# Patient Record
Sex: Female | Born: 1975 | Race: White | Hispanic: No | Marital: Single | Smoking: Never smoker
Health system: Southern US, Community
[De-identification: ages and names within clinical notes are randomized; demographics above are authoritative.]

## PROBLEM LIST (undated history)

## (undated) DIAGNOSIS — R569 Unspecified convulsions: Secondary | ICD-10-CM

## (undated) DIAGNOSIS — F842 Rett's syndrome: Secondary | ICD-10-CM

## (undated) DIAGNOSIS — G249 Dystonia, unspecified: Secondary | ICD-10-CM

---

## 2003-12-16 ENCOUNTER — Other Ambulatory Visit: Payer: Self-pay

## 2012-12-28 ENCOUNTER — Ambulatory Visit: Payer: Medicare Other | Attending: Family Medicine | Admitting: Physical Therapy

## 2012-12-28 DIAGNOSIS — R269 Unspecified abnormalities of gait and mobility: Secondary | ICD-10-CM | POA: Insufficient documentation

## 2012-12-28 DIAGNOSIS — IMO0001 Reserved for inherently not codable concepts without codable children: Secondary | ICD-10-CM | POA: Insufficient documentation

## 2013-04-25 ENCOUNTER — Encounter (HOSPITAL_COMMUNITY): Payer: Self-pay | Admitting: Emergency Medicine

## 2013-04-25 ENCOUNTER — Emergency Department (HOSPITAL_COMMUNITY): Payer: Medicare Other

## 2013-04-25 ENCOUNTER — Emergency Department (HOSPITAL_COMMUNITY)
Admission: EM | Admit: 2013-04-25 | Discharge: 2013-04-25 | Disposition: A | Discharge status: Home / Self Care | Payer: Medicare Other | Attending: Emergency Medicine | Admitting: Emergency Medicine

## 2013-04-25 DIAGNOSIS — R4182 Altered mental status, unspecified: Secondary | ICD-10-CM | POA: Insufficient documentation

## 2013-04-25 DIAGNOSIS — R259 Unspecified abnormal involuntary movements: Secondary | ICD-10-CM | POA: Insufficient documentation

## 2013-04-25 DIAGNOSIS — R064 Hyperventilation: Secondary | ICD-10-CM | POA: Insufficient documentation

## 2013-04-25 DIAGNOSIS — G3189 Other specified degenerative diseases of nervous system: Secondary | ICD-10-CM | POA: Insufficient documentation

## 2013-04-25 DIAGNOSIS — Z79899 Other long term (current) drug therapy: Secondary | ICD-10-CM | POA: Insufficient documentation

## 2013-04-25 DIAGNOSIS — R000 Tachycardia, unspecified: Secondary | ICD-10-CM | POA: Insufficient documentation

## 2013-04-25 DIAGNOSIS — R569 Unspecified convulsions: Secondary | ICD-10-CM | POA: Diagnosis present

## 2013-04-25 HISTORY — DX: Dystonia, unspecified: G24.9

## 2013-04-25 HISTORY — DX: Unspecified convulsions: R56.9

## 2013-04-25 HISTORY — DX: Genetic susceptibility to epilepsy and neurodevelopmental disorders: F84.2

## 2013-04-25 LAB — URINALYSIS W MICROSCOPIC + REFLEX CULTURE
Bilirubin Urine: NEGATIVE
Glucose, UA: NEGATIVE mg/dL
Hgb urine dipstick: NEGATIVE
KETONES UR: 15 mg/dL — AB
LEUKOCYTES UA: NEGATIVE
NITRITE: NEGATIVE
PH: 8.5 — AB (ref 5.0–8.0)
PROTEIN: 100 mg/dL — AB
Specific Gravity, Urine: 1.027 (ref 1.005–1.030)
Urobilinogen, UA: 1 mg/dL (ref 0.0–1.0)

## 2013-04-25 LAB — CBC WITH DIFFERENTIAL/PLATELET
Basophils Absolute: 0 10*3/uL (ref 0.0–0.1)
Basophils Relative: 1 % (ref 0–1)
EOS ABS: 0.1 10*3/uL (ref 0.0–0.7)
EOS PCT: 2 % (ref 0–5)
HEMATOCRIT: 38.8 % (ref 36.0–46.0)
HEMOGLOBIN: 12.9 g/dL (ref 12.0–15.0)
LYMPHS ABS: 1.6 10*3/uL (ref 0.7–4.0)
Lymphocytes Relative: 29 % (ref 12–46)
MCH: 28.2 pg (ref 26.0–34.0)
MCHC: 33.2 g/dL (ref 30.0–36.0)
MCV: 84.9 fL (ref 78.0–100.0)
MONO ABS: 0.4 10*3/uL (ref 0.1–1.0)
MONOS PCT: 7 % (ref 3–12)
NEUTROS PCT: 62 % (ref 43–77)
Neutro Abs: 3.4 10*3/uL (ref 1.7–7.7)
Platelets: 123 10*3/uL — ABNORMAL LOW (ref 150–400)
RBC: 4.57 MIL/uL (ref 3.87–5.11)
RDW: 16.5 % — ABNORMAL HIGH (ref 11.5–15.5)
WBC: 5.5 10*3/uL (ref 4.0–10.5)

## 2013-04-25 LAB — COMPREHENSIVE METABOLIC PANEL
ALT: 14 U/L (ref 0–35)
AST: 26 U/L (ref 0–37)
Albumin: 3.8 g/dL (ref 3.5–5.2)
Alkaline Phosphatase: 56 U/L (ref 39–117)
BUN: 18 mg/dL (ref 6–23)
CALCIUM: 9 mg/dL (ref 8.4–10.5)
CO2: 19 mEq/L (ref 19–32)
Chloride: 105 mEq/L (ref 96–112)
Creatinine, Ser: 0.47 mg/dL — ABNORMAL LOW (ref 0.50–1.10)
GFR calc non Af Amer: >90 mL/min (ref >90)
GLUCOSE: 84 mg/dL (ref 70–99)
Potassium: 4 mEq/L (ref 3.7–5.3)
Sodium: 139 mEq/L (ref 137–147)
Total Bilirubin: 0.3 mg/dL (ref 0.3–1.2)
Total Protein: 7.4 g/dL (ref 6.0–8.3)

## 2013-04-25 MED ORDER — SODIUM CHLORIDE 0.9 % IV BOLUS (SEPSIS)
500.0000 mL | INTRAVENOUS | Status: AC
  Administered 2013-04-25: 500 mL via INTRAVENOUS

## 2013-04-25 MED ORDER — LORAZEPAM 2 MG/ML IJ SOLN
0.5000 mg | Freq: Once | INTRAMUSCULAR | Status: AC
  Administered 2013-04-25: 0.5 mg via INTRAVENOUS
  Filled 2013-04-25: qty 1

## 2013-04-25 NOTE — Discharge Instructions (Signed)
Seizure, Adult A seizure is abnormal electrical activity in the brain. Seizures usually last from 30 seconds to 2 minutes. There are various types of seizures. Before a seizure, you may have a warning sensation (aura) that a seizure is about to occur. An aura may include the following symptoms:   Fear or anxiety.  Nausea.  Feeling like the room is spinning (vertigo).  Vision changes, such as seeing flashing lights or spots. Common symptoms during a seizure include:  A change in attention or behavior (altered mental status).  Convulsions with rhythmic jerking movements.  Drooling.  Rapid eye movements.  Grunting.  Loss of bladder and bowel control.  Bitter taste in the mouth.  Tongue biting. After a seizure, you may feel confused and sleepy. You may also have an injury resulting from convulsions during the seizure. HOME CARE INSTRUCTIONS   If you are given medicines, take them exactly as prescribed by your health care provider.  Keep all follow-up appointments as directed by your health care provider.  Do not swim or drive or engage in risky activity during which a seizure could cause further injury to you or others until your health care provider says it is OK.  Get adequate rest.  Teach friends and family what to do if you have a seizure. They should:  Lay you on the ground to prevent a fall.  Put a cushion under your head.  Loosen any tight clothing around your neck.  Turn you on your side. If vomiting occurs, this helps keep your airway clear.  Stay with you until you recover.  Know whether or not you need emergency care. SEEK IMMEDIATE MEDICAL CARE IF:  The seizure lasts longer than 5 minutes.  The seizure is severe or you do not wake up immediately after the seizure.  You have an altered mental status after the seizure.  You are having more frequent or worsening seizures. Someone should drive you to the emergency department or call local emergency  services (911 in [place] MAKE SURE YOU:  Understand these instructions.  Will watch your condition.  Will get help right away if you are not doing well or get worse. Document Released: 03/21/2000 Document Revised: 01/12/2013 Document Reviewed: 11/03/2012 Exit[employer] Patient Information 2014 [employer].  

## 2013-04-25 NOTE — ED Notes (Addendum)
Pt does not communicate, seizure pads placed.

## 2013-04-25 NOTE — ED Notes (Signed)
Pt presents [facility] with questionable seizure witnessed by family member.  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

## 2013-04-25 NOTE — ED Provider Notes (Signed)
CSN: [id number]     Arrival date & time 04/25/13  1218 History   First MD Initiated Contact with Patient 04/25/13 1222     Chief Complaint  Patient presents with  . Seizures   (Consider location/radiation/quality/duration/timing/severity/associated sxs/prior Treatment) Patient is a 38 y.o. female presenting with seizures. The history is provided by the patient.  Seizures Seizure activity on arrival: no   Seizure type:  Unable to specify Initial focality:  None Episode characteristics: abnormal movements   Return to baseline: yes   Severity:  Mild Duration: 10-15 minutes. Timing:  Once Progression:  Resolved Context comment:  Rett syndrome Recent head injury:  No recent head injuries PTA treatment:  None History of seizures: yes     History reviewed. No pertinent past medical history. History reviewed. No pertinent past surgical history. No family history on file. History  Substance Use Topics  . Smoking status: Not on file  . Smokeless tobacco: Not on file  . Alcohol Use: Not on file   OB History   Grav Para Term Preterm Abortions TAB SAB Ect Mult Living                 Review of Systems  Constitutional: Negative for fever and fatigue.  HENT: Negative for congestion and drooling.   Eyes: Negative for pain.  Respiratory: Negative for cough and shortness of breath.   Cardiovascular: Negative for chest pain.  Gastrointestinal: Negative for nausea, vomiting, abdominal pain and diarrhea.  Genitourinary: Negative for dysuria and hematuria.  Musculoskeletal: Negative for back pain, gait problem and neck pain.  Skin: Negative for color change.  Neurological: Positive for seizures. Negative for dizziness and headaches.  Hematological: Negative for adenopathy.  Psychiatric/Behavioral: Negative for behavioral problems.  All other systems reviewed and are negative.    Allergies  Review of patient's allergies indicates not on file.  Home Medications  No current  outpatient prescriptions on file. BP 152/78  Pulse 110  Temp(Src) 99.9 F (37.7 C) (Axillary)  Resp 30  Wt 86 lb (39.009 kg)  SpO2 100% Physical Exam  Nursing note and vitals reviewed. Constitutional: She appears well-developed and well-nourished.  Intermittent hyperventilation and breath holding spells.   HENT:  Head: Normocephalic.  Mouth/Throat: Oropharynx is clear and moist. No oropharyngeal exudate.  Eyes: Conjunctivae and EOM are normal. Pupils are equal, round, and reactive to light.  Neck: Normal range of motion. Neck supple.  Cardiovascular: Normal rate, regular rhythm, normal heart sounds and intact distal pulses.  Exam reveals no gallop and no friction rub.   No murmur heard. HR 96  Pulmonary/Chest: Effort normal and breath sounds normal. No respiratory distress. She has no wheezes.  Abdominal: Soft. Bowel sounds are normal. There is no tenderness. There is no rebound and no guarding.  Musculoskeletal: Normal range of motion. She exhibits no edema and no tenderness.  Neurological: She is alert.  Pt w/ some lip smacking and looking around the room. Normal per parents.   Skin: Skin is warm and dry.  Psychiatric: She has a normal mood and affect. Her behavior is normal.    ED Course  Procedures (including critical care time) Labs Review Labs Reviewed  CBC WITH DIFFERENTIAL - Abnormal; Notable for the following:    RDW 16.5 (*)    Platelets 123 (*)    All other components within normal limits  COMPREHENSIVE METABOLIC PANEL - Abnormal; Notable for the following:    Creatinine, Ser 0.47 (*)    All other components within normal  limits  URINALYSIS W MICROSCOPIC + REFLEX CULTURE - Abnormal; Notable for the following:    APPearance TURBID (*)    pH 8.5 (*)    Ketones, ur 15 (*)    Protein, ur 100 (*)    Squamous Epithelial / LPF FEW (*)    All other components within normal limits   Imaging Review Dg Chest 1 View  04/25/2013   CLINICAL DATA:  Seizure.  EXAM: CHEST  - 1 VIEW  COMPARISON:  None.  FINDINGS: Mediastinum and hilar structures are normal. Deformity noted over the lower right posterior ribs including the sixth, seventh, and eighth ribs. This may be congenital. Active bony lesions cannot be excluded. Severe thoracic spine scoliosis is present. Surgical rod fixation noted. Patient rotated to the left. Mild increased density is present in the right lung base. This may be related to overlying rib deformity and patient rotation, mild pneumonia cannot be excluded. Heart size normal.  IMPRESSION: 1. Lower posterior right rib deformities. Scoliosis with prior surgical fixation. Patient rotated to the left. These findings together result in increased density projected over the right lung base. Underlying pneumonia cannot be completely excluded. 2. Right posterior rib lesions present on most likely congenital however not to process cannot be excluded.   Electronically Signed   By: Maisie Fushomas  Register   On: 04/25/2013 13:43   Ct Head Wo Contrast  04/25/2013   CLINICAL DATA:  Seizure.  Rett syndrome  EXAM: CT HEAD WITHOUT CONTRAST  TECHNIQUE: Contiguous axial images were obtained from the base of the skull through the vertex without intravenous contrast.  COMPARISON:  None.  FINDINGS: No evidence of intracranial hemorrhage, brain edema, or other signs of acute infarction. No evidence of intracranial mass lesion or mass effect. No abnormal extraaxial fluid collections identified. Ventricles are normal in size. No skull abnormality identified.  IMPRESSION: No acute intracranial findings.   Electronically Signed   By: Myles RosenthalJohn  Stahl M.D.   On: 04/25/2013 13:52    EKG Interpretation    Date/Time:  Monday April 25 2013 12:23:58 EST Ventricular Rate:  105 PR Interval:  154 QRS Duration: 77 QT Interval:  330 QTC Calculation: 436 R Axis:   65 Text Interpretation:  Sinus tachycardia Confirmed by Abdirahim Flavell  MD, Heinrich Fertig ([id number]) on 04/26/2013 11:03:27 AM            MDM    1. 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 The parents note that prior to arrival the patient had writhing-like movements and altered level of consciousness for approximately 10-15 minutes. They state that she has otherwise been well and denies any fever, vomiting, diarrhea, or cough. She is afebrile and mildly tachycardic here. She is currently hyperventilating which the parents state is not a new finding. They state she has a history of breath-holding spells as well as hyperventilation as a form of self-stimulation and this is normal. The parents deny any head injuries, sleep loss, or missing medications. Will get screening labwork and imaging.  3:33 PM: I interpreted/reviewed the labs and/or imaging which were non-contributory.  Pt continues to appear well. I attempted to contact Dr. Tera HelperBoggs, her neurologist, but it is a holiday ([employer] day) and she is unable to be reached. Specifically I wanted to discuss possible inc of her carbemazepine. As the pt appears well w/ questionable seizure episode and non-contrib workup. I think  it is reasonable to have the parents touch base w/ Dr. Tera Helper tomorrow and discuss the increase. They are happy w/ that plan.  I have discussed the diagnosis/risks/treatment options with the family and believe the pt to be eligible for discharge home to follow-up with Dr. Tera Helper tomorrow. We also discussed returning to the ED immediately if new or worsening sx occur. We discussed the sx which are most concerning (e.g., further episodes, fever, AMS) that necessitate immediate return. Any new prescriptions provided to the patient are listed below.  New Prescriptions   No medications on file     Junius Argyle, MD 04/26/13 1112

## 2015-05-24 ENCOUNTER — Encounter (HOSPITAL_COMMUNITY): Payer: Self-pay | Admitting: *Deleted

## 2015-05-24 ENCOUNTER — Emergency Department (HOSPITAL_COMMUNITY)
Admission: EM | Admit: 2015-05-24 | Discharge: 2015-05-24 | Disposition: A | Discharge status: Home / Self Care | Payer: Medicare Other | Attending: Emergency Medicine | Admitting: Emergency Medicine

## 2015-05-24 ENCOUNTER — Emergency Department (HOSPITAL_COMMUNITY): Payer: Medicare Other

## 2015-05-24 DIAGNOSIS — R05 Cough: Secondary | ICD-10-CM | POA: Diagnosis present

## 2015-05-24 DIAGNOSIS — Z880 Allergy status to penicillin: Secondary | ICD-10-CM | POA: Diagnosis not present

## 2015-05-24 DIAGNOSIS — Z79899 Other long term (current) drug therapy: Secondary | ICD-10-CM | POA: Insufficient documentation

## 2015-05-24 DIAGNOSIS — Z8659 Personal history of other mental and behavioral disorders: Secondary | ICD-10-CM | POA: Diagnosis not present

## 2015-05-24 DIAGNOSIS — J069 Acute upper respiratory infection, unspecified: Secondary | ICD-10-CM | POA: Diagnosis not present

## 2015-05-24 DIAGNOSIS — R059 Cough, unspecified: Secondary | ICD-10-CM

## 2015-05-24 DIAGNOSIS — G249 Dystonia, unspecified: Secondary | ICD-10-CM | POA: Insufficient documentation

## 2015-05-24 DIAGNOSIS — B9789 Other viral agents as the cause of diseases classified elsewhere: Secondary | ICD-10-CM

## 2015-05-24 LAB — CBC WITH DIFFERENTIAL/PLATELET
Basophils Absolute: 0 10*3/uL (ref 0.0–0.1)
Basophils Relative: 0 %
EOS PCT: 0 %
Eosinophils Absolute: 0 10*3/uL (ref 0.0–0.7)
HCT: 34.1 % — ABNORMAL LOW (ref 36.0–46.0)
Hemoglobin: 9.9 g/dL — ABNORMAL LOW (ref 12.0–15.0)
LYMPHS ABS: 0.8 10*3/uL (ref 0.7–4.0)
Lymphocytes Relative: 13 %
MCH: 22 pg — AB (ref 26.0–34.0)
MCHC: 29 g/dL — ABNORMAL LOW (ref 30.0–36.0)
MCV: 75.6 fL — ABNORMAL LOW (ref 78.0–100.0)
MONO ABS: 0.5 10*3/uL (ref 0.1–1.0)
MONOS PCT: 8 %
NEUTROS ABS: 4.8 10*3/uL (ref 1.7–7.7)
Neutrophils Relative %: 79 %
PLATELETS: 110 10*3/uL — AB (ref 150–400)
RBC: 4.51 MIL/uL (ref 3.87–5.11)
RDW: 19.3 % — ABNORMAL HIGH (ref 11.5–15.5)
WBC: 6.1 10*3/uL (ref 4.0–10.5)

## 2015-05-24 LAB — BASIC METABOLIC PANEL
Anion gap: 11 (ref 5–15)
BUN: 13 mg/dL (ref 6–20)
CO2: 24 mmol/L (ref 22–32)
CREATININE: 0.45 mg/dL (ref 0.44–1.00)
Calcium: 9.2 mg/dL (ref 8.9–10.3)
Chloride: 105 mmol/L (ref 101–111)
GFR calc Af Amer: >60 mL/min (ref >60)
GFR calc non Af Amer: >60 mL/min (ref >60)
Glucose, Bld: 108 mg/dL — ABNORMAL HIGH (ref 65–99)
Potassium: 4 mmol/L (ref 3.5–5.1)
SODIUM: 140 mmol/L (ref 135–145)

## 2015-05-24 LAB — I-STAT CG4 LACTIC ACID, ED: LACTIC ACID, VENOUS: 0.72 mmol/L (ref 0.5–2.0)

## 2015-05-24 MED ORDER — IPRATROPIUM-ALBUTEROL 0.5-2.5 (3) MG/3ML IN SOLN: 3.0000 mL | RESPIRATORY_TRACT | Status: DC

## 2015-05-24 MED ORDER — SODIUM CHLORIDE 0.9 % IV BOLUS (SEPSIS)
2000.0000 mL | Freq: Once | INTRAVENOUS | Status: AC
  Administered 2015-05-24: 2000 mL via INTRAVENOUS

## 2015-05-24 MED ORDER — IPRATROPIUM-ALBUTEROL 0.5-2.5 (3) MG/3ML IN SOLN
3.0000 mL | Freq: Once | RESPIRATORY_TRACT | Status: AC
  Administered 2015-05-24: 3 mL via RESPIRATORY_TRACT
  Filled 2015-05-24: qty 3

## 2015-05-24 NOTE — ED Provider Notes (Signed)
CSN: [id number]     Arrival date & time 05/24/15  0128 History   First MD Initiated Contact with Patient 05/24/15 0449     Chief Complaint  Patient presents with  . Cough     (Consider location/radiation/quality/duration/timing/severity/associated sxs/prior Treatment) HPI  Alicia Duncan is a 40 y.o. female with PMH of Rett syndrome and dystonia, here with coughing for the past couple of days.  History obtained from husband as the patient is nonverbal. He states the patient has not had any fevers.  The cough sounds "junky" and he is concerned for pneumonia.  She has otherwise been at her normal states of health.  She has sick contacts in her father who recently had a cold.  There are no further complaints.  10 Systems reviewed and are negative for acute change except as noted in the HPI.    Past Medical History  Diagnosis Date  . Rett syndrome   . Seizures (HCC)   . Dystonia    History reviewed. No pertinent past surgical history. No family history on file. Social History  Substance Use Topics  . Smoking status: Never Smoker   . Smokeless tobacco: Never Used  . Alcohol Use: No   OB History    No data available     Review of Systems    Allergies  Benadryl and Penicillins  Home Medications   Prior to Admission medications   Medication Sig Start Date End Date Taking? Authorizing Provider  baclofen (LIORESAL) 10 MG tablet Take 10 mg by mouth 3 (three) times daily.    Historical Provider, MD  carbamazepine (CARBATROL) 300 MG 12 hr capsule Take 300 mg by mouth 2 (two) times daily.    Historical Provider, MD  FLUoxetine (PROZAC) 10 MG tablet Take 10 mg by mouth daily.    Historical Provider, MD  omeprazole (PRILOSEC) 20 MG capsule Take 20 mg by mouth daily.    Historical Provider, MD   BP 109/79 mmHg  Pulse 92  Temp(Src) 98.6 F (37 C) (Rectal)  Resp 27  SpO2 96% Physical Exam  Constitutional: No distress.  HENT:  Head: Normocephalic and atraumatic.  Nose: Nose  normal.  Eyes: Conjunctivae and EOM are normal. Pupils are equal, round, and reactive to light. No scleral icterus.  Neck: No JVD present. No tracheal deviation present. No thyromegaly present.  Cardiovascular: Normal rate, regular rhythm and normal heart sounds.  Exam reveals no gallop and no friction rub.   No murmur heard. Pulmonary/Chest: Effort normal. No respiratory distress. She has wheezes. She exhibits no tenderness.  Intermittent wheezing heard  Abdominal: Soft. Bowel sounds are normal. She exhibits no distension and no mass. There is no tenderness. There is no rebound and no guarding.  Musculoskeletal: She exhibits no edema or tenderness.  Lymphadenopathy:    She has no cervical adenopathy.  Neurological: She is alert.  Skin: Skin is warm and dry. No rash noted. No erythema. No pallor.  Nursing note and vitals reviewed.   ED Course  Procedures (including critical care time) Labs Review Labs Reviewed  CBC WITH DIFFERENTIAL/PLATELET - Abnormal; Notable for the following:    Hemoglobin 9.9 (*)    HCT 34.1 (*)    MCV 75.6 (*)    MCH 22.0 (*)    MCHC 29.0 (*)    RDW 19.3 (*)    All other components within normal limits  BASIC METABOLIC PANEL - Abnormal; Notable for the following:    Glucose, Bld 108 (*)  All other components within normal limits  I-STAT CG4 LACTIC ACID, ED    Imaging Review Dg Chest 1 View  05/24/2015  CLINICAL DATA:  Cough and congestion for several days. EXAM: CHEST 1 VIEW COMPARISON:  Chest radiograph April 15, 2013 FINDINGS: Cardiomediastinal silhouette is normal. The lungs are clear without pleural effusions or focal consolidations. Trachea projects midline and there is no pneumothorax. Soft tissue planes and included osseous structures are non-suspicious.Gracile bones, osteopenia. Broad dextroscoliosis with Harrington rods in place. Old RIGHT rib deformities. IMPRESSION: No acute cardiopulmonary process. Electronically Signed   By: Awilda Metro  M.D.   On: 05/24/2015 04:58   I have personally reviewed and evaluated these images and lab results as part of my medical decision-making.   EKG Interpretation None      MDM   Final diagnoses:  Cough     Patient presents to the ED for coughing.  CXR is negative for pneumonia.  Labs are unremarkable for any acute infection. She was given IVF for tachycardia and that has now resolved.  She has intermittent wheezing on exam, she was given a duoneb treatment.  Father educated on Chest PT at home and close fu with PCP was advised.  Continue tylenol/ibuprofen for pain and fever. She appears well and in NAD.  VS remain within her normal limits and she is safe for DC.   Tomasita Crumble, MD 05/24/15 [phone]

## 2015-05-24 NOTE — ED Notes (Signed)
The pt is c/o a cough chest congestion for several days.  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  lmp irregular premenapausal.  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 She is non-verbal

## 2015-05-24 NOTE — Discharge Instructions (Signed)
Upper Respiratory Infection, Adult Ms. Markel chest xray today was normal.  See your primary care doctor within 3 days for close follow up.  Use tylenol or ibuprofen as needed for any fever or pain.  If any symptoms worsen, come back to the ED immediately. Thank you. Most upper respiratory infections (URIs) are caused by a virus. A URI affects the nose, throat, and upper air passages. The most common type of URI is often called "the common cold." HOME CARE   Take medicines only as told by your doctor.  Gargle warm saltwater or take cough drops to comfort your throat as told by your doctor.  Use a warm mist humidifier or inhale steam from a shower to increase air moisture. This may make it easier to breathe.  Drink enough fluid to keep your pee (urine) clear or pale yellow.  Eat soups and other clear broths.  Have a healthy diet.  Rest as needed.  Go back to work when your fever is gone or your doctor says it is okay.  You may need to stay home longer to avoid giving your URI to others.  You can also wear a face mask and wash your hands often to prevent spread of the virus.  Use your inhaler more if you have asthma.  Do not use any tobacco products, including cigarettes, chewing tobacco, or electronic cigarettes. If you need help quitting, ask your doctor. GET HELP IF:  You are getting worse, not better.  Your symptoms are not helped by medicine.  You have chills.  You are getting more short of breath.  You have brown or red mucus.  You have yellow or brown discharge from your nose.  You have pain in your face, especially when you bend forward.  You have a fever.  You have puffy (swollen) neck glands.  You have pain while swallowing.  You have white areas in the back of your throat. GET HELP RIGHT AWAY IF:   You have very bad or constant:  Headache.  Ear pain.  Pain in your forehead, behind your eyes, and over your cheekbones (sinus pain).  Chest  pain.  You have long-lasting (chronic) lung disease and any of the following:  Wheezing.  Long-lasting cough.  Coughing up blood.  A change in your usual mucus.  You have a stiff neck.  You have changes in your:  Vision.  Hearing.  Thinking.  Mood. MAKE SURE YOU:   Understand these instructions.  Will watch your condition.  Will get help right away if you are not doing well or get worse.   This information is not intended to replace advice given to you by your health care provider. Make sure you discuss any questions you have with your health care provider.   Document Released: 09/10/2007 Document Revised: 08/08/2014 Document Reviewed: 06/29/2013 Elsevier Interactive Patient Education [employer]

## 2015-06-26 ENCOUNTER — Emergency Department
Admission: EM | Admit: 2015-06-26 | Discharge: 2015-06-26 | Disposition: A | Discharge status: Home / Self Care | Payer: Medicare Other | Attending: Emergency Medicine | Admitting: Emergency Medicine

## 2015-06-26 ENCOUNTER — Encounter: Payer: Self-pay | Admitting: *Deleted

## 2015-06-26 DIAGNOSIS — R197 Diarrhea, unspecified: Secondary | ICD-10-CM | POA: Insufficient documentation

## 2015-06-26 DIAGNOSIS — N939 Abnormal uterine and vaginal bleeding, unspecified: Secondary | ICD-10-CM | POA: Diagnosis present

## 2015-06-26 NOTE — ED Notes (Addendum)
Pt to triage via wheelchair.  Father reports pt has diarrhea, passing gas and vaginal bleeding for 3 days.  Pt has retts syndrome. Unable to do labs/urine in triage.  Pt nonverbal.  Pt alert.

## 2015-07-22 ENCOUNTER — Emergency Department
Admission: EM | Admit: 2015-07-22 | Discharge: 2015-07-22 | Disposition: A | Discharge status: Home / Self Care | Payer: Medicare Other | Attending: Emergency Medicine | Admitting: Emergency Medicine

## 2015-07-22 ENCOUNTER — Encounter: Payer: Self-pay | Admitting: Emergency Medicine

## 2015-07-22 DIAGNOSIS — G249 Dystonia, unspecified: Secondary | ICD-10-CM | POA: Insufficient documentation

## 2015-07-22 DIAGNOSIS — M62838 Other muscle spasm: Secondary | ICD-10-CM | POA: Diagnosis present

## 2015-07-22 DIAGNOSIS — F842 Rett's syndrome: Secondary | ICD-10-CM | POA: Diagnosis not present

## 2015-07-22 DIAGNOSIS — R569 Unspecified convulsions: Secondary | ICD-10-CM | POA: Insufficient documentation

## 2015-07-22 LAB — CBC
HCT: 32.4 % — ABNORMAL LOW (ref 35.0–47.0)
Hemoglobin: 10 g/dL — ABNORMAL LOW (ref 12.0–16.0)
MCH: 22.1 pg — AB (ref 26.0–34.0)
MCHC: 30.8 g/dL — ABNORMAL LOW (ref 32.0–36.0)
MCV: 71.6 fL — AB (ref 80.0–100.0)
Platelets: 139 10*3/uL — ABNORMAL LOW (ref 150–440)
RBC: 4.53 MIL/uL (ref 3.80–5.20)
RDW: 21.1 % — ABNORMAL HIGH (ref 11.5–14.5)
WBC: 4.9 10*3/uL (ref 3.6–11.0)

## 2015-07-22 LAB — COMPREHENSIVE METABOLIC PANEL
ALK PHOS: 60 U/L (ref 38–126)
ALT: 15 U/L (ref 14–54)
AST: 35 U/L (ref 15–41)
Albumin: 4.2 g/dL (ref 3.5–5.0)
Anion gap: 8 (ref 5–15)
BUN: 13 mg/dL (ref 6–20)
CALCIUM: 8.8 mg/dL — AB (ref 8.9–10.3)
CO2: 18 mmol/L — ABNORMAL LOW (ref 22–32)
CREATININE: 0.54 mg/dL (ref 0.44–1.00)
Chloride: 108 mmol/L (ref 101–111)
Glucose, Bld: 106 mg/dL — ABNORMAL HIGH (ref 65–99)
Potassium: 3.9 mmol/L (ref 3.5–5.1)
Sodium: 134 mmol/L — ABNORMAL LOW (ref 135–145)
Total Bilirubin: 0.4 mg/dL (ref 0.3–1.2)
Total Protein: 7.7 g/dL (ref 6.5–8.1)
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
## 2015-07-22 NOTE — ED Notes (Signed)
Father reports pt had second episode while in ED, this one lasting less than 30 seconds.

## 2015-07-22 NOTE — ED Provider Notes (Signed)
[facility] Emergency Department Provider Note  ____________________________________________    I have reviewed the triage vital signs and the nursing notes.   HISTORY  Chief Complaint Spasms  History per father, patient is nonverbal at baseline  HPI Alicia Duncan is a 40 y.o. female with a history of Rett syndrome and is nonverbal at baseline. She probably also has a history of seizures and dystonia but this has been relatively well-controlled. Father reports he was in the shower this morning and heard a knocking sound on the wall when he went to see his daughter she had fallen off the bed and was banging against the wall in an uncontrolled way. He feels this lasted several minutes. He tried to restrain her. He feels that she was alert while she was doing this and this was not similar to prior seizures but he is not sure. He does not believe she sustained a head injury. Since then she has been acting normally. [facility]     Past Medical History  Diagnosis Date  . Rett syndrome   . Seizures (HCC)   . Dystonia     Patient Active Problem List   Diagnosis Date Noted  . Seizure (HCC) 04/25/2013    History reviewed. No pertinent past surgical history.  Current Outpatient Rx  Name  Route  Sig  Dispense  Refill  . baclofen (LIORESAL) 10 MG tablet   Oral   Take 10 mg by mouth 3 (three) times daily.         . carbamazepine (CARBATROL) 300 MG 12 hr capsule   Oral   Take 300 mg by mouth 2 (two) times daily.         Marland Kitchen. FLUoxetine (PROZAC) 10 MG tablet   Oral   Take 10 mg by mouth daily.         Marland Kitchen. omeprazole (PRILOSEC) 20 MG capsule   Oral   Take 20 mg by mouth daily.           Allergies Benadryl and Penicillins  History reviewed. No pertinent family history.  Social History Social History  Substance Use Topics  . Smoking status: Never Smoker   . Smokeless tobacco: Never Used  . Alcohol Use: No     Review of SystemsPer father  Constitutional: Negative for fever. Eyes: Negative for redness   Respiratory: Hyperventilation which is common for her Gastrointestinal: Nose ingestion of abdominal pain per father Genitourinary: No foul-smelling urine Musculoskeletal: No joint swelling Skin: Negative for rash. Neurological: Negative for focal weakness     ____________________________________________   PHYSICAL EXAM:  VITAL SIGNS: ED Triage Vitals  Enc Vitals Group     BP 07/22/15 0926 154/70 mmHg     Pulse Rate 07/22/15 0926 120     Resp 07/22/15 0926 30     Temp 07/22/15 0926 99.4 F (37.4 C)     Temp Source 07/22/15 0926 Axillary     SpO2 07/22/15 0926 100 %     Weight 07/22/15 0926 84 lb 3.5 oz (38.2 kg)     Height 07/22/15 0926 4\' 11"  (1.499 m)     Head Cir --      Peak Flow --      Pain Score --      Pain Loc --      Pain Edu? --      Excl. in GC? --     Constitutional: Lying in bed, calm Eyes: Conjunctivae are normal.  No erythema or injection ENT   Head: Normocephalic and atraumatic.   Mouth/Throat: Mucous membranes are moist. Cardiovascular: Tachycardia. Normal and symmetric distal pulses are present in the upper extremities.  Respiratory: Intermittent Rapid breathing which father says is normal for her. Breath sounds are clear and equal bilaterally.  Gastrointestinal: Soft and non-tender in all quadrants. No distention. There is no CVA tenderness. Genitourinary: deferred Musculoskeletal: No pain with axial load of the hips, no pelvic tenderness to palpation, extremities appear uninjured and are nontender  Neurologic:  Patient nonverbal Skin:  Skin is warm, dry and intact. No injury noted Psychiatric: At baseline per father  ____________________________________________    LABS (pertinent positives/negatives)  Labs Reviewed  BLOOD GAS, ARTERIAL     ____________________________________________   EKG  None  ____________________________________________    RADIOLOGY    ____________________________________________   PROCEDURES  Procedure(s) performed: none  Critical Care performed: none  ____________________________________________   INITIAL IMPRESSION / ASSESSMENT AND PLAN / ED COURSE  Pertinent labs & imaging results that were available during my care of the patient were reviewed by me and considered in my medical decision making (see chart for details).  Patient well-appearing and in baseline in the emergency department per her father. We will check labs and discuss with [facility] neurology  Lab work is unremarkable. Discussed with [facility] neurologist on-call, she does not recommend adjusting  seizure medications at this time. Patient has been monitored in the ED without symptoms. [facility] will reschedule her outpatient visit for close follow-up. Discussed this with the father he agrees with discharge as planned.  ____________________________________________   FINAL CLINICAL IMPRESSION(S) / ED DIAGNOSES  Final diagnoses:  Seizure (HCC)          Jene Every, MD 07/22/15 1544

## 2015-07-22 NOTE — Discharge Instructions (Signed)

## 2015-08-09 ENCOUNTER — Emergency Department
Admission: EM | Admit: 2015-08-09 | Discharge: 2015-08-09 | Disposition: A | Discharge status: Home / Self Care | Payer: Medicare Other | Attending: Emergency Medicine | Admitting: Emergency Medicine

## 2015-08-09 ENCOUNTER — Encounter: Payer: Self-pay | Admitting: *Deleted

## 2015-08-09 DIAGNOSIS — G40909 Epilepsy, unspecified, not intractable, without status epilepticus: Secondary | ICD-10-CM | POA: Diagnosis present

## 2015-08-09 DIAGNOSIS — R569 Unspecified convulsions: Secondary | ICD-10-CM

## 2015-08-09 LAB — CARBAMAZEPINE LEVEL, TOTAL: Carbamazepine Lvl: 7.6 ug/mL (ref 4.0–12.0)

## 2015-08-09 LAB — COMPREHENSIVE METABOLIC PANEL
ALK PHOS: 65 U/L (ref 38–126)
ALT: 15 U/L (ref 14–54)
ANION GAP: 19 — AB (ref 5–15)
AST: 43 U/L — ABNORMAL HIGH (ref 15–41)
Albumin: 4.2 g/dL (ref 3.5–5.0)
BILIRUBIN TOTAL: 0.4 mg/dL (ref 0.3–1.2)
BUN: 15 mg/dL (ref 6–20)
CALCIUM: 9.1 mg/dL (ref 8.9–10.3)
CO2: 12 mmol/L — ABNORMAL LOW (ref 22–32)
CREATININE: 0.74 mg/dL (ref 0.44–1.00)
Chloride: 104 mmol/L (ref 101–111)
Glucose, Bld: 189 mg/dL — ABNORMAL HIGH (ref 65–99)
Potassium: 4 mmol/L (ref 3.5–5.1)
Sodium: 135 mmol/L (ref 135–145)
TOTAL PROTEIN: 7.7 g/dL (ref 6.5–8.1)

## 2015-08-09 LAB — CBC WITH DIFFERENTIAL/PLATELET
Basophils Absolute: 0 10*3/uL (ref 0–0.1)
Basophils Relative: 1 %
EOS ABS: 0.1 10*3/uL (ref 0–0.7)
Eosinophils Relative: 3 %
HCT: 34.7 % — ABNORMAL LOW (ref 35.0–47.0)
HEMOGLOBIN: 10.4 g/dL — AB (ref 12.0–16.0)
LYMPHS ABS: 0.8 10*3/uL — AB (ref 1.0–3.6)
MCH: 22.1 pg — AB (ref 26.0–34.0)
MCHC: 30.1 g/dL — AB (ref 32.0–36.0)
MCV: 73.4 fL — AB (ref 80.0–100.0)
Monocytes Absolute: 0.2 10*3/uL (ref 0.2–0.9)
NEUTROS ABS: 4.2 10*3/uL (ref 1.4–6.5)
Platelets: 148 10*3/uL — ABNORMAL LOW (ref 150–440)
RBC: 4.73 MIL/uL (ref 3.80–5.20)
RDW: 20.6 % — ABNORMAL HIGH (ref 11.5–14.5)
WBC: 5.4 10*3/uL (ref 3.6–11.0)

## 2015-08-09 MED ORDER — TIAGABINE HCL 2 MG PO TABS: 2.0000 mg | ORAL_TABLET | Freq: Three times a day (TID) | ORAL | Status: DC

## 2015-08-09 MED ORDER — TIAGABINE HCL 2 MG PO TABS
2.0000 mg | ORAL_TABLET | Freq: Once | ORAL | Status: AC
  Administered 2015-08-09: 2 mg via ORAL
  Filled 2015-08-09: qty 1

## 2015-08-09 NOTE — ED Provider Notes (Addendum)
[facility] Emergency Department Provider Note        Time seen: ----------------------------------------- 9:29 AM on 08/09/2015 -----------------------------------------  Level 5 Caveat: Review of systems and history is limited by altered mental status  I have reviewed the triage vital signs and the nursing notes.   HISTORY  Chief Complaint Seizures    HPI Alicia Duncan is a 40 y.o. female who presents ER after having a seizure at home. According to patient's father she has a history of epilepsy and had a seizure last about 15 minutes. She hasn't point with a neurologist in July, on arrival she is nonverbal and hyperventilating. No further information is available.   Past Medical History  Diagnosis Date  . Rett syndrome   . Seizures (HCC)   . Dystonia     Patient Active Problem List   Diagnosis Date Noted  . Seizure (HCC) 04/25/2013    History reviewed. No pertinent past surgical history.  Allergies Benadryl and Penicillins  Social History Social History  Substance Use Topics  . Smoking status: Never Smoker   . Smokeless tobacco: Never Used  . Alcohol Use: No    Review of Systems Unknown, positive for seizure-like event  ____________________________________________   PHYSICAL EXAM:  VITAL SIGNS: ED Triage Vitals  Enc Vitals Group     BP 08/09/15 0911 102/75 mmHg     Pulse Rate 08/09/15 0911 113     Resp 08/09/15 0911 26     Temp 08/09/15 0911 98.3 F (36.8 C)     Temp Source 08/09/15 0911 Axillary     SpO2 08/09/15 0911 100 %     Weight 08/09/15 0911 85 lb 12.1 oz (38.9 kg)     Height 08/09/15 0911 5\' 2"  (1.575 m)     Head Cir --      Peak Flow --      Pain Score --      Pain Loc --      Pain Edu? --      Excl. in GC? --     Constitutional: Alert But disoriented, hyperventilating Eyes: Conjunctivae are normal. PERRL. Normal extraocular movements. ENT   Head: Atraumatic   Nose: No  congestion/rhinnorhea.   Mouth/Throat: Mucous membranes are moist.   Neck: No stridor. Cardiovascular: Rapid rate, regular rhythm. No murmurs, rubs, or gallops. Respiratory: Tachypnea with clear breath sounds, No wheezes/rales/rhonchi. Gastrointestinal: Soft and nontender. Normal bowel sounds Musculoskeletal: Nontender with normal range of motion in all extremities.  Neurologic:  Patient cannot cooperate with neurologic exam at this time. Skin:  Skin is warm, dry and intact. No rash noted.  ____________________________________________  ED COURSE:  Pertinent labs & imaging results that were available during my care of the patient were reviewed by me and considered in my medical decision making (see chart for details). Patient presents to ER after a seizure like event. I will check basic labs and discussed with neurology.  EKG: Interpreted by me, sinus tachycardia with a rate of 126 bpm, normal PR interval, normal QRS, normal QT interval. Normal axis. ____________________________________________    LABS (pertinent positives/negatives)  Labs Reviewed  CBC WITH DIFFERENTIAL/PLATELET - Abnormal; Notable for the following:    Hemoglobin 10.4 (*)    HCT 34.7 (*)    MCV 73.4 (*)    MCH 22.1 (*)    MCHC 30.1 (*)    RDW 20.6 (*)    Platelets 148 (*)    Lymphs Abs 0.8 (*)    All other components  within normal limits  COMPREHENSIVE METABOLIC PANEL - Abnormal; Notable for the following:    CO2 12 (*)    Glucose, Bld 189 (*)    AST 43 (*)    Anion gap 19 (*)    All other components within normal limits  CARBAMAZEPINE LEVEL, TOTAL   ____________________________________________  FINAL ASSESSMENT AND PLAN  Seizure  Plan: Patient with labs as dictated above. Patient has returned to her baseline according to her father who is her caregiver. I have spent significant time discussing with [facility]. They recommended Gabitril 3 times daily. Again the case was discussed  with Dr. Tera Helper who is recommended this medication to take. She is stable for outpatient follow-up with Dr. Tera Helper as scheduled.   Emily Filbert, MD   Note: This dictation was prepared with Dragon dictation. Any transcriptional errors that result from this process are unintentional   Emily Filbert, MD 08/09/15 1215  Emily Filbert, MD 08/09/15 [phone]

## 2015-08-09 NOTE — ED Notes (Addendum)
Pt arrives via EMS for a seizure, dad states hx of epilepsy and a seizure that lasted 15 mins, pt has appt with neurologist in July, upon arrival pt non-verbal, hyperventalating

## 2015-08-09 NOTE — Discharge Instructions (Signed)

## 2015-08-09 NOTE — ED Notes (Signed)
Father at bedside, seizure pads placed on bed

## 2016-05-11 ENCOUNTER — Emergency Department: Payer: Medicare Other

## 2016-05-11 ENCOUNTER — Inpatient Hospital Stay
Admission: EM | Admit: 2016-05-11 | Discharge: 2016-05-13 | DRG: 872 | Disposition: A | Discharge status: Home / Self Care | Payer: Medicare Other | Attending: Internal Medicine | Admitting: Internal Medicine

## 2016-05-11 ENCOUNTER — Encounter: Payer: Self-pay | Admitting: Emergency Medicine

## 2016-05-11 DIAGNOSIS — R6510 Systemic inflammatory response syndrome (SIRS) of non-infectious origin without acute organ dysfunction: Secondary | ICD-10-CM

## 2016-05-11 DIAGNOSIS — K122 Cellulitis and abscess of mouth: Secondary | ICD-10-CM | POA: Diagnosis present

## 2016-05-11 DIAGNOSIS — Z888 Allergy status to other drugs, medicaments and biological substances status: Secondary | ICD-10-CM

## 2016-05-11 DIAGNOSIS — R0600 Dyspnea, unspecified: Secondary | ICD-10-CM

## 2016-05-11 DIAGNOSIS — D509 Iron deficiency anemia, unspecified: Secondary | ICD-10-CM | POA: Diagnosis present

## 2016-05-11 DIAGNOSIS — G40909 Epilepsy, unspecified, not intractable, without status epilepticus: Secondary | ICD-10-CM

## 2016-05-11 DIAGNOSIS — F842 Rett's syndrome: Secondary | ICD-10-CM | POA: Diagnosis present

## 2016-05-11 DIAGNOSIS — A419 Sepsis, unspecified organism: Principal | ICD-10-CM | POA: Diagnosis present

## 2016-05-11 DIAGNOSIS — Z880 Allergy status to penicillin: Secondary | ICD-10-CM

## 2016-05-11 DIAGNOSIS — Z79899 Other long term (current) drug therapy: Secondary | ICD-10-CM | POA: Diagnosis not present

## 2016-05-11 DIAGNOSIS — E872 Acidosis: Secondary | ICD-10-CM | POA: Diagnosis present

## 2016-05-11 DIAGNOSIS — R059 Cough, unspecified: Secondary | ICD-10-CM

## 2016-05-11 DIAGNOSIS — R1310 Dysphagia, unspecified: Secondary | ICD-10-CM | POA: Diagnosis present

## 2016-05-11 DIAGNOSIS — Z818 Family history of other mental and behavioral disorders: Secondary | ICD-10-CM

## 2016-05-11 DIAGNOSIS — K047 Periapical abscess without sinus: Secondary | ICD-10-CM

## 2016-05-11 DIAGNOSIS — Z8249 Family history of ischemic heart disease and other diseases of the circulatory system: Secondary | ICD-10-CM

## 2016-05-11 DIAGNOSIS — R05 Cough: Secondary | ICD-10-CM | POA: Diagnosis present

## 2016-05-11 DIAGNOSIS — D61818 Other pancytopenia: Secondary | ICD-10-CM | POA: Diagnosis present

## 2016-05-11 DIAGNOSIS — Z151 Rett's syndrome: Secondary | ICD-10-CM

## 2016-05-11 DIAGNOSIS — T814XXA Infection following a procedure, initial encounter: Secondary | ICD-10-CM | POA: Diagnosis present

## 2016-05-11 LAB — CBC WITH DIFFERENTIAL/PLATELET
BASOS ABS: 0 10*3/uL (ref 0–0.1)
Basophils Relative: 1 %
Eosinophils Absolute: 0.2 10*3/uL (ref 0–0.7)
Eosinophils Relative: 4 %
HEMATOCRIT: 41.3 % (ref 35.0–47.0)
HEMOGLOBIN: 13 g/dL (ref 12.0–16.0)
LYMPHS PCT: 18 %
Lymphs Abs: 0.9 10*3/uL — ABNORMAL LOW (ref 1.0–3.6)
MCH: 25.3 pg — ABNORMAL LOW (ref 26.0–34.0)
MCHC: 31.4 g/dL — ABNORMAL LOW (ref 32.0–36.0)
MCV: 80.6 fL (ref 80.0–100.0)
MONO ABS: 0.5 10*3/uL (ref 0.2–0.9)
Monocytes Relative: 11 %
NEUTROS ABS: 3.1 10*3/uL (ref 1.4–6.5)
Neutrophils Relative %: 66 %
Platelets: 100 10*3/uL — ABNORMAL LOW (ref 150–440)
RBC: 5.13 MIL/uL (ref 3.80–5.20)
RDW: 19.9 % — ABNORMAL HIGH (ref 11.5–14.5)
WBC: 4.7 10*3/uL (ref 3.6–11.0)

## 2016-05-11 LAB — URINALYSIS, ROUTINE W REFLEX MICROSCOPIC
Bilirubin Urine: NEGATIVE
GLUCOSE, UA: NEGATIVE mg/dL
HGB URINE DIPSTICK: NEGATIVE
KETONES UR: NEGATIVE mg/dL
Leukocytes, UA: NEGATIVE
Nitrite: NEGATIVE
PH: 7 (ref 5.0–8.0)
PROTEIN: NEGATIVE mg/dL
Specific Gravity, Urine: 1.019 (ref 1.005–1.030)

## 2016-05-11 LAB — COMPREHENSIVE METABOLIC PANEL
ALK PHOS: 66 U/L (ref 38–126)
ALT: 20 U/L (ref 14–54)
AST: 36 U/L (ref 15–41)
Albumin: 4.3 g/dL (ref 3.5–5.0)
Anion gap: 12 (ref 5–15)
BUN: 10 mg/dL (ref 6–20)
CO2: 23 mmol/L (ref 22–32)
CREATININE: 0.5 mg/dL (ref 0.44–1.00)
Calcium: 9.4 mg/dL (ref 8.9–10.3)
Chloride: 102 mmol/L (ref 101–111)
GFR calc Af Amer: >60 mL/min (ref >60)
Glucose, Bld: 103 mg/dL — ABNORMAL HIGH (ref 65–99)
Potassium: 3.7 mmol/L (ref 3.5–5.1)
Sodium: 137 mmol/L (ref 135–145)
Total Bilirubin: 0.4 mg/dL (ref 0.3–1.2)
Total Protein: 8 g/dL (ref 6.5–8.1)

## 2016-05-11 LAB — URINE DRUG SCREEN, QUALITATIVE (ARMC ONLY)
AMPHETAMINES, UR SCREEN: NOT DETECTED
Barbiturates, Ur Screen: NOT DETECTED
Benzodiazepine, Ur Scrn: NOT DETECTED
Cannabinoid 50 Ng, Ur ~~LOC~~: NOT DETECTED
Cocaine Metabolite,Ur ~~LOC~~: NOT DETECTED
MDMA (ECSTASY) UR SCREEN: NOT DETECTED
METHADONE SCREEN, URINE: NOT DETECTED
OPIATE, UR SCREEN: NOT DETECTED
PHENCYCLIDINE (PCP) UR S: NOT DETECTED
Tricyclic, Ur Screen: NOT DETECTED

## 2016-05-11 LAB — LACTIC ACID, PLASMA
LACTIC ACID, VENOUS: 1.5 mmol/L (ref 0.5–1.9)
Lactic Acid, Venous: 2.3 mmol/L (ref 0.5–1.9)

## 2016-05-11 LAB — INFLUENZA PANEL BY PCR (TYPE A & B)
INFLAPCR: NEGATIVE
INFLBPCR: NEGATIVE

## 2016-05-11 LAB — LIPASE, BLOOD: LIPASE: 50 U/L (ref 11–51)

## 2016-05-11 LAB — PROTIME-INR
INR: 0.92
Prothrombin Time: 12.3 seconds (ref 11.4–15.2)

## 2016-05-11 LAB — TROPONIN I

## 2016-05-11 MED ORDER — CLINDAMYCIN PHOSPHATE 600 MG/50ML IV SOLN
600.0000 mg | Freq: Once | INTRAVENOUS | Status: AC
  Administered 2016-05-11: 600 mg via INTRAVENOUS
  Filled 2016-05-11: qty 50

## 2016-05-11 MED ORDER — SODIUM CHLORIDE 0.9 % IV SOLN
INTRAVENOUS | Status: DC
  Administered 2016-05-11 – 2016-05-12 (×2): via INTRAVENOUS
  Administered 2016-05-12: 1000 mL via INTRAVENOUS

## 2016-05-11 MED ORDER — BISACODYL 10 MG RE SUPP: 10.0000 mg | Freq: Every day | RECTAL | Status: DC | PRN

## 2016-05-11 MED ORDER — PANTOPRAZOLE SODIUM 40 MG PO TBEC
40.0000 mg | DELAYED_RELEASE_TABLET | Freq: Every day | ORAL | Status: DC
  Administered 2016-05-11 – 2016-05-13 (×3): 40 mg via ORAL
  Filled 2016-05-11 (×3): qty 1

## 2016-05-11 MED ORDER — SODIUM CHLORIDE 0.9% FLUSH
3.0000 mL | Freq: Two times a day (BID) | INTRAVENOUS | Status: DC
  Administered 2016-05-11 – 2016-05-13 (×5): 3 mL via INTRAVENOUS

## 2016-05-11 MED ORDER — IPRATROPIUM-ALBUTEROL 0.5-2.5 (3) MG/3ML IN SOLN
3.0000 mL | Freq: Four times a day (QID) | RESPIRATORY_TRACT | Status: DC
  Administered 2016-05-11 (×2): 3 mL via RESPIRATORY_TRACT
  Filled 2016-05-11 (×2): qty 3

## 2016-05-11 MED ORDER — HEPARIN SODIUM (PORCINE) 5000 UNIT/ML IJ SOLN
5000.0000 [IU] | Freq: Three times a day (TID) | INTRAMUSCULAR | Status: DC
  Administered 2016-05-11 – 2016-05-13 (×5): 5000 [IU] via SUBCUTANEOUS
  Filled 2016-05-11 (×5): qty 1

## 2016-05-11 MED ORDER — ONDANSETRON HCL 4 MG PO TABS: 4.0000 mg | ORAL_TABLET | Freq: Four times a day (QID) | ORAL | Status: DC | PRN

## 2016-05-11 MED ORDER — ACETAMINOPHEN 325 MG PO TABS: 650.0000 mg | ORAL_TABLET | Freq: Four times a day (QID) | ORAL | Status: DC | PRN

## 2016-05-11 MED ORDER — IPRATROPIUM-ALBUTEROL 0.5-2.5 (3) MG/3ML IN SOLN: 3.0000 mL | RESPIRATORY_TRACT | Status: DC | PRN

## 2016-05-11 MED ORDER — FERROUS SULFATE 220 (44 FE) MG/5ML PO ELIX
60.0000 mg | ORAL_SOLUTION | Freq: Every day | ORAL | Status: DC
  Administered 2016-05-11 – 2016-05-12 (×2): 60 mg via ORAL
  Filled 2016-05-11 (×3): qty 6.9

## 2016-05-11 MED ORDER — CARBAMAZEPINE ER 100 MG PO TB12
300.0000 mg | ORAL_TABLET | Freq: Two times a day (BID) | ORAL | Status: DC
  Administered 2016-05-11: 300 mg via ORAL
  Filled 2016-05-11 (×2): qty 3

## 2016-05-11 MED ORDER — FLUOXETINE HCL 20 MG PO TABS
10.0000 mg | ORAL_TABLET | Freq: Every day | ORAL | Status: DC
  Filled 2016-05-11: qty 1

## 2016-05-11 MED ORDER — SODIUM CHLORIDE 0.9% FLUSH: 3.0000 mL | INTRAVENOUS | Status: DC | PRN

## 2016-05-11 MED ORDER — TIAGABINE HCL 2 MG PO TABS
2.0000 mg | ORAL_TABLET | Freq: Three times a day (TID) | ORAL | Status: DC
  Administered 2016-05-11 – 2016-05-13 (×5): 2 mg via ORAL
  Filled 2016-05-11 (×8): qty 1

## 2016-05-11 MED ORDER — ACETAMINOPHEN 650 MG RE SUPP: 650.0000 mg | Freq: Four times a day (QID) | RECTAL | Status: DC | PRN

## 2016-05-11 MED ORDER — ORAL CARE MOUTH RINSE
15.0000 mL | Freq: Two times a day (BID) | OROMUCOSAL | Status: DC
  Administered 2016-05-11 – 2016-05-13 (×5): 15 mL via OROMUCOSAL

## 2016-05-11 MED ORDER — SODIUM CHLORIDE 0.9 % IV BOLUS (SEPSIS)
1000.0000 mL | Freq: Once | INTRAVENOUS | Status: AC
  Administered 2016-05-11: 1000 mL via INTRAVENOUS

## 2016-05-11 MED ORDER — DOCUSATE SODIUM 100 MG PO CAPS
100.0000 mg | ORAL_CAPSULE | Freq: Two times a day (BID) | ORAL | Status: DC
  Administered 2016-05-11 – 2016-05-13 (×4): 100 mg via ORAL
  Filled 2016-05-11 (×4): qty 1

## 2016-05-11 MED ORDER — CLINDAMYCIN PHOSPHATE 600 MG/50ML IV SOLN
600.0000 mg | Freq: Three times a day (TID) | INTRAVENOUS | Status: DC
  Filled 2016-05-11 (×3): qty 50

## 2016-05-11 MED ORDER — ONDANSETRON HCL 4 MG/2ML IJ SOLN: 4.0000 mg | Freq: Four times a day (QID) | INTRAMUSCULAR | Status: DC | PRN

## 2016-05-11 MED ORDER — BACLOFEN 10 MG PO TABS
10.0000 mg | ORAL_TABLET | Freq: Three times a day (TID) | ORAL | Status: DC
  Administered 2016-05-11 – 2016-05-13 (×6): 10 mg via ORAL
  Filled 2016-05-11 (×6): qty 1

## 2016-05-11 MED ORDER — FERROUS SULFATE 300 (60 FE) MG/5ML PO SYRP
300.0000 mg | ORAL_SOLUTION | Freq: Every day | ORAL | Status: DC
  Filled 2016-05-11: qty 5

## 2016-05-11 MED ORDER — CLINDAMYCIN PHOSPHATE 600 MG/50ML IV SOLN
600.0000 mg | Freq: Three times a day (TID) | INTRAVENOUS | Status: DC
  Administered 2016-05-11 – 2016-05-12 (×3): 600 mg via INTRAVENOUS
  Filled 2016-05-11 (×4): qty 50

## 2016-05-11 MED ORDER — FLUOXETINE HCL 10 MG PO CAPS
10.0000 mg | ORAL_CAPSULE | Freq: Every day | ORAL | Status: DC
  Administered 2016-05-11 – 2016-05-13 (×3): 10 mg via ORAL
  Filled 2016-05-11 (×3): qty 1

## 2016-05-11 NOTE — ED Provider Notes (Signed)
[facility] Emergency Department Provider Note  ____________________________________________   First MD Initiated Contact with Patient 05/11/16 0700     (approximate)  I have reviewed the triage vital signs and the nursing notes.   HISTORY  Chief Complaint Altered Mental Status   HPI Alicia Duncan is a 41 y.o. female with a history of Rett syndrome as well as seizures who is presenting to emergency department after being difficult to arouse this morning. Her father, who is accompanying her, says that he was unable to wake her up today. He says that he called EMS who were able to wake her up but only after prolonged period of time. He says that she had 7, upper teeth removed this past Thursday at [facility] and is currently on amoxicillin. He says that he has had difficulty giving her the amoxicillin because she has been resistant to taking it. Denies any known fever at home. Says that the patient is acting at her baseline now.  Additionally, the father says that the patient was coughing this morning. She has intermittent tachypnea, but he says that this is her baseline associated with the Rett syndrome.   Past Medical History:  Diagnosis Date  . Dystonia   . Rett syndrome   . Seizures [facility])     Patient Active Problem List   Diagnosis Date Noted  . Seizure (HCC) 04/25/2013    History reviewed. No pertinent surgical history.  Prior to Admission medications   Medication Sig Start Date End Date Taking? Authorizing Provider  amoxicillin (AMOXIL) 400 MG/5ML suspension Take 400 mg by mouth 3 (three) times daily.   Yes Historical Provider, MD  baclofen (LIORESAL) 10 MG tablet Take 10 mg by mouth 3 (three) times daily.   Yes Historical Provider, MD  carbamazepine (CARBATROL) 300 MG 12 hr capsule Take 300 mg by mouth 2 (two) times daily.   Yes Historical Provider, MD  ferrous sulfate 300 (60 Fe) MG/5ML syrup Take 300 mg by mouth daily.   Yes Historical Provider, MD   FLUoxetine (PROZAC) 10 MG tablet Take 10 mg by mouth daily.   Yes Historical Provider, MD  omeprazole (PRILOSEC) 20 MG capsule Take 20 mg by mouth daily.   Yes Historical Provider, MD  tiaGABine (GABITRIL) 2 MG tablet Take 1 tablet (2 mg total) by mouth 3 (three) times daily. 08/09/15  Yes Emily Filbert, MD    Allergies Benadryl [diphenhydramine] and Penicillins  History reviewed. No pertinent family history.  Social History Social History  Substance Use Topics  . Smoking status: Never Smoker  . Smokeless tobacco: Never Used  . Alcohol use No    Review of Systems Level V caveat secondary to the patient being nonverbal at her baseline. ____________________________________________   PHYSICAL EXAM:  VITAL SIGNS: ED Triage Vitals  Enc Vitals Group     BP 05/11/16 0601 (!) 143/130     Pulse Rate 05/11/16 0601 (!) 108     Resp 05/11/16 0601 (!) 28     Temp 05/11/16 0601 97.8 F (36.6 C)     Temp Source 05/11/16 0601 Axillary     SpO2 05/11/16 0606 100 %     Weight --      Height --      Head Circumference --      Peak Flow --      Pain Score --      Pain Loc --      Pain Edu? --  Excl. in GC? --     Constitutional: Alert and in no acute distress. Eyes: Conjunctivae are normal. PERRL. EOMI. Head: Atraumatic. Nose: No congestion/rhinnorhea. Mouth/Throat: Mucous membranes are moist.  Able to examine the front, upper gums with assistance from the father as well as our PA student because the patient was resistant to examination. The father says that this is the patient's baseline resists examination. There was no exudate. No bleeding or swelling. No fluctuance palpated. Neck: No stridor.   Cardiovascular: Tachycardic, regular rhythm. Grossly normal heart sounds.   Respiratory: Normal respiratory effort.  No retractions. Lungs CTAB. Gastrointestinal: Soft and nontender. No distention.  Musculoskeletal: No lower extremity tenderness nor edema.  Neurologic:  Moves  all 4 extremities equally. Skin:  Skin is warm, dry and intact. No rash noted.   ____________________________________________   LABS (all labs ordered are listed, but only abnormal results are displayed)  Labs Reviewed  CBC WITH DIFFERENTIAL/PLATELET - Abnormal; Notable for the following:       Result Value   MCH 25.3 (*)    MCHC 31.4 (*)    RDW 19.9 (*)    Platelets 100 (*)    Lymphs Abs 0.9 (*)    All other components within normal limits  COMPREHENSIVE METABOLIC PANEL - Abnormal; Notable for the following:    Glucose, Bld 103 (*)    All other components within normal limits  URINALYSIS, ROUTINE W REFLEX MICROSCOPIC - Abnormal; Notable for the following:    Color, Urine YELLOW (*)    APPearance CLOUDY (*)    All other components within normal limits  LACTIC ACID, PLASMA - Abnormal; Notable for the following:    Lactic Acid, Venous 2.3 (*)    All other components within normal limits  CULTURE, BLOOD (ROUTINE X 2)  CULTURE, BLOOD (ROUTINE X 2)  URINE CULTURE  LIPASE, BLOOD  PROTIME-INR  LACTIC ACID, PLASMA  URINE DRUG SCREEN, QUALITATIVE ([facility] ONLY)  TROPONIN I  INFLUENZA PANEL BY PCR (TYPE A & B)  CBG MONITORING, ED   ____________________________________________  EKG  ED ECG REPORT I, Arelia Longest, the attending physician, personally viewed and interpreted this ECG.   Date: 05/11/2016  EKG Time: 0557  Rate: 109  Rhythm: sinus tachycardia  Axis: normal  Intervals:none  ST&T Change: No ST segment elevation or depression. No abnormal T-wave inversion.  ____________________________________________  RADIOLOGY    DG Chest 2 View (Final result)  Result time 05/11/16 08:20:38  Final result by Princella Pellegrini, MD (05/11/16 08:20:38)           Narrative:   CLINICAL DATA: 41 year old female status post Oral surgery 3 days ago with decreased mental status this morning. Rett syndrome. Initial encounter.  EXAM: CHEST 2 VIEW  COMPARISON: 05/24/2015  and earlier.  FINDINGS: Seated AP and lateral views of the chest. Lung volumes are within normal limits in both lungs appear clear. Normal cardiac size and mediastinal contours. Visualized tracheal air column is within normal limits. Scoliosis with posterior spinal rods. Stable visible bowel gas pattern with gas-filled but nondilated loops.  IMPRESSION: No acute cardiopulmonary abnormality.   Electronically Signed By: Odessa Fleming M.D. On: 05/11/2016 08:20          ____________________________________________   PROCEDURES  Procedure(s) performed:   Procedures  Critical Care performed:   ____________________________________________   INITIAL IMPRESSION / ASSESSMENT AND PLAN / ED COURSE  Pertinent labs & imaging results that were available during my care of the patient were reviewed by me and  considered in my medical decision making (see chart for details).  ----------------------------------------- 12:10 PM on 05/11/2016 -----------------------------------------  Thoracic acid is has resolved with the patient is still tachycardic. I had a lengthy discussion with the patient's father about neck steps and care. Because the patient has not been receiving her amoxicillin at home due to not being cooperative and also spitting medication back out. I feel that she is at higher risk for develop a secondary infection especially to this recent procedure that was done. Her persistent tachycardia as well as her elevated lactic acid make me more concerned that she may need antibiotic therapy. She'll be started on IV clindamycin. She'll be admitted to the hospital. Signed out to Dr. Judithann SheenSparks. According to the father the patient is now acting her baseline level of activity.     ____________________________________________   FINAL CLINICAL IMPRESSION(S) / ED DIAGNOSES  Sepsis.    NEW MEDICATIONS STARTED DURING THIS VISIT:  New Prescriptions   No medications on file      Note:  This document was prepared using Dragon voice recognition software and may include unintentional dictation errors.    Myrna Blazeravid Matthew Cyle Kenyon, MD 05/11/16 [phone]

## 2016-05-11 NOTE — ED Notes (Signed)
Father arrived at bedside.

## 2016-05-11 NOTE — ED Triage Notes (Addendum)
Per EMS: EMS staff states per pt's father Pt has Rett Syndrome; [facility]; father found pt this morning around 0515 hard to arouse; ems vs: bp 139/76, 100% on Room Air, 86 Hr, temp 98.8 axillary. Per EMS father states that he has not given pt any hydrocodone post oral procedure. EMS states father on the way to ED

## 2016-05-11 NOTE — Progress Notes (Signed)
Readily ate pureed food/yogurt for dinner. Refused bottle attempts at po; pushes away.

## 2016-05-11 NOTE — ED Notes (Signed)
Had secretary call for bed status as the room as been assigned for 2 hrs

## 2016-05-11 NOTE — Progress Notes (Signed)
Pt admitted from ED into stat cleaned room for safety/care for special needs pt. Father accompanied pt. Father to bring home meds that are not available to pharmacy. Care initiated immediately with pt alert, nonverbal; intermittent rapid type breathing which has now calmed to normal, easy with pt less anxious with reassurance/support.  No acute distress. Pt requires total ADL. Used bottle at home at time for po's.

## 2016-05-11 NOTE — H&P (Signed)
History and Physical    Alicia Duncan:[id number] DOB: 24-Jun-1975 DOA: 05/11/2016  Referring physician: Dr. Pershing Proud PCP: Alicia Mina, MD  Specialists: none  Chief Complaint: lethargy  HPI: Alicia Duncan is a 41 y.o. female has a past medical history significant for Rett syndrome and seizures who underwent multiple dental extractions 4 days ago now with fever, tachycardia, and elevated lactic acid c/w SIRS. Pt was brought to ER with lethargy and cough. She is now admitted. Family states pt has daily seizures  Review of Systems: unable to obtain from pt due to cognitive deficits and non-verbal status  Past Medical History:  Diagnosis Date  . Dystonia   . Rett syndrome   . Seizures (HCC)    History reviewed. No pertinent surgical history. Social History:  reports that she has never smoked. She has never used smokeless tobacco. She reports that she does not drink alcohol or use drugs.  Allergies  Allergen Reactions  . Benadryl [Diphenhydramine]   . Penicillins     History reviewed. No pertinent family history.  Prior to Admission medications   Medication Sig Start Date End Date Taking? Authorizing Provider  amoxicillin (AMOXIL) 400 MG/5ML suspension Take 400 mg by mouth 3 (three) times daily.   Yes Historical Provider, MD  baclofen (LIORESAL) 10 MG tablet Take 10 mg by mouth 3 (three) times daily.   Yes Historical Provider, MD  carbamazepine (CARBATROL) 300 MG 12 hr capsule Take 300 mg by mouth 2 (two) times daily.   Yes Historical Provider, MD  ferrous sulfate 300 (60 Fe) MG/5ML syrup Take 300 mg by mouth daily.   Yes Historical Provider, MD  FLUoxetine (PROZAC) 10 MG tablet Take 10 mg by mouth daily.   Yes Historical Provider, MD  omeprazole (PRILOSEC) 20 MG capsule Take 20 mg by mouth daily.   Yes Historical Provider, MD  tiaGABine (GABITRIL) 2 MG tablet Take 1 tablet (2 mg total) by mouth 3 (three) times daily. 08/09/15  Yes Emily Filbert, MD   Physical  Exam: Vitals:   05/11/16 0852 05/11/16 0900 05/11/16 0930 05/11/16 1111  BP:  (!) 154/77 (!) 143/87 (!) 145/75  Pulse:      Resp:  19 (!) 29 (!) 23  Temp:      TempSrc:      SpO2:      Weight: 35.6 kg (78 lb 8 oz)     Height: 5' (1.524 m)        General:  No apparent distress, chronically ill appearing with contractures  Eyes: PERRL, EOMI, no scleral icterus, conjunctiva clear  ENT: moist oropharynx without exudate, TM's benign, edentulous  Neck: supple, no lymphadenopathy. No bruits or thyromegaly  Cardiovascular: rapid rate  With regular rhythm without MRG; 2+ peripheral pulses, no JVD, no peripheral edema  Respiratory: scattered rhonchi without wheezes or rales. Respiratory effort normal  Abdomen: soft, non tender to palpation, positive bowel sounds, no guarding, no rebound  Skin: no rashes or lesions  Musculoskeletal: normal bulk and tone, no joint swelling  Psychiatric: unable to assess  Neurologic: non-verbal with contractures  Labs on Admission:  Basic Metabolic Panel:  Recent Labs Lab 05/11/16 0614  NA 137  K 3.7  CL 102  CO2 23  GLUCOSE 103*  BUN 10  CREATININE 0.50  CALCIUM 9.4   Liver Function Tests:  Recent Labs Lab 05/11/16 0614  AST 36  ALT 20  ALKPHOS 66  BILITOT 0.4  PROT 8.0  ALBUMIN 4.3  Recent Labs Lab 05/11/16 0614  LIPASE 50   No results for input(s): AMMONIA in the last 168 hours. CBC:  Recent Labs Lab 05/11/16 0614  WBC 4.7  NEUTROABS 3.1  HGB 13.0  HCT 41.3  MCV 80.6  PLT 100*   Cardiac Enzymes:  Recent Labs Lab 05/11/16 0614  TROPONINI <0.03    BNP (last 3 results) No results for input(s): BNP in the last 8760 hours.  ProBNP (last 3 results) No results for input(s): PROBNP in the last 8760 hours.  CBG: No results for input(s): GLUCAP in the last 168 hours.  Radiological Exams on Admission: Dg Chest 2 View  Result Date: 05/11/2016 CLINICAL DATA:  41 year old female status post Oral surgery 3  days ago with decreased mental status this morning. Rett syndrome. Initial encounter. EXAM: CHEST  2 VIEW COMPARISON:  05/24/2015 and earlier. FINDINGS: Seated AP and lateral views of the chest. Lung volumes are within normal limits in both lungs appear clear. Normal cardiac size and mediastinal contours. Visualized tracheal air column is within normal limits. Scoliosis with posterior spinal rods. Stable visible bowel gas pattern with gas-filled but nondilated loops. IMPRESSION: No acute cardiopulmonary abnormality. Electronically Signed   By: Odessa FlemingH  Hall M.D.   On: 05/11/2016 08:20    EKG: Independently reviewed.  Assessment/Plan Principal Problem:   SIRS (systemic inflammatory response syndrome) (HCC) Active Problems:   Dental infection   Rett syndrome   Seizure disorder (HCC)   Will admit to floor with IV ABX and IV fluids. Cultures sent. Continue outpatient seizure meds. Consult Neurology. Repeat labs IN AM.  Diet: soft Fluids: NS@100  DVT Prophylaxis: SQ Heparin  Code Status: FULL  Family Communication: yes  Disposition Plan: home  Time spent: 50 min

## 2016-05-11 NOTE — Progress Notes (Signed)
Seizure precautions in place. Provided bottle with nipple for oral intake. Pt held and threw across to table. Meds given in applesauce without difficulty. Pt puts hand in stool in diaper when soiled with hygiene care provided.

## 2016-05-12 ENCOUNTER — Inpatient Hospital Stay: Payer: Medicare Other

## 2016-05-12 DIAGNOSIS — G40909 Epilepsy, unspecified, not intractable, without status epilepticus: Secondary | ICD-10-CM

## 2016-05-12 LAB — COMPREHENSIVE METABOLIC PANEL WITH GFR
ALT: 15 U/L (ref 14–54)
AST: 22 U/L (ref 15–41)
Albumin: 3.1 g/dL — ABNORMAL LOW (ref 3.5–5.0)
Alkaline Phosphatase: 67 U/L (ref 38–126)
Anion gap: 5 (ref 5–15)
BUN: 10 mg/dL (ref 6–20)
CO2: 20 mmol/L — ABNORMAL LOW (ref 22–32)
Calcium: 8 mg/dL — ABNORMAL LOW (ref 8.9–10.3)
Chloride: 114 mmol/L — ABNORMAL HIGH (ref 101–111)
Creatinine, Ser: 0.47 mg/dL (ref 0.44–1.00)
GFR calc Af Amer: >60 mL/min
GFR calc non Af Amer: >60 mL/min
Glucose, Bld: 83 mg/dL (ref 65–99)
Potassium: 3.8 mmol/L (ref 3.5–5.1)
Sodium: 139 mmol/L (ref 135–145)
Total Bilirubin: 0.2 mg/dL — ABNORMAL LOW (ref 0.3–1.2)
Total Protein: 6.2 g/dL — ABNORMAL LOW (ref 6.5–8.1)

## 2016-05-12 LAB — IRON AND TIBC
Iron: 28 ug/dL (ref 28–170)
Saturation Ratios: 9 % — ABNORMAL LOW (ref 10.4–31.8)
TIBC: 312 ug/dL (ref 250–450)
UIBC: 284 ug/dL

## 2016-05-12 LAB — CBC
HCT: 32.7 % — ABNORMAL LOW (ref 35.0–47.0)
Hemoglobin: 10.4 g/dL — ABNORMAL LOW (ref 12.0–16.0)
MCH: 25.5 pg — ABNORMAL LOW (ref 26.0–34.0)
MCHC: 31.9 g/dL — ABNORMAL LOW (ref 32.0–36.0)
MCV: 79.9 fL — AB (ref 80.0–100.0)
Platelets: 94 10*3/uL — ABNORMAL LOW (ref 150–440)
RBC: 4.1 MIL/uL (ref 3.80–5.20)
RDW: 19.8 % — AB (ref 11.5–14.5)
WBC: 3.1 10*3/uL — AB (ref 3.6–11.0)

## 2016-05-12 LAB — FERRITIN: Ferritin: 12 ng/mL (ref 11–307)

## 2016-05-12 MED ORDER — ENSURE ENLIVE PO LIQD
237.0000 mL | Freq: Three times a day (TID) | ORAL | Status: DC
  Administered 2016-05-12 – 2016-05-13 (×3): 237 mL via ORAL

## 2016-05-12 MED ORDER — FERROUS SULFATE 220 (44 FE) MG/5ML PO ELIX
60.0000 mg | ORAL_SOLUTION | Freq: Three times a day (TID) | ORAL | Status: DC
  Administered 2016-05-12 – 2016-05-13 (×3): 60 mg via ORAL
  Filled 2016-05-12 (×4): qty 6.9

## 2016-05-12 MED ORDER — CARBAMAZEPINE 100 MG PO CHEW
300.0000 mg | CHEWABLE_TABLET | Freq: Two times a day (BID) | ORAL | Status: DC
  Administered 2016-05-12 – 2016-05-13 (×3): 300 mg via ORAL
  Filled 2016-05-12 (×4): qty 3

## 2016-05-12 MED ORDER — DEXTROSE 5 % IV SOLN
100.0000 mg | Freq: Two times a day (BID) | INTRAVENOUS | Status: DC
  Administered 2016-05-12 – 2016-05-13 (×2): 100 mg via INTRAVENOUS
  Filled 2016-05-12 (×3): qty 100

## 2016-05-12 MED ORDER — KCL IN DEXTROSE-NACL 20-5-0.9 MEQ/L-%-% IV SOLN
INTRAVENOUS | Status: DC
  Administered 2016-05-12 – 2016-05-13 (×2): via INTRAVENOUS
  Filled 2016-05-12 (×5): qty 1000

## 2016-05-12 MED ORDER — CARBAMAZEPINE 100 MG/5ML PO SUSP
300.0000 mg | Freq: Two times a day (BID) | ORAL | Status: DC
  Filled 2016-05-12: qty 15

## 2016-05-12 NOTE — Progress Notes (Signed)
[facility] - [place] at [facility]   PATIENT NAME: Alicia Duncan    MR#:  [id number]  DATE OF BIRTH:  06/16/1975  SUBJECTIVE:  CHIEF COMPLAINT:   Chief Complaint  Patient presents with  . Altered Mental Status  Patient is 41 year old Caucasian female with medical history significant for history of Rett syndrome, seizures, who underwent multiple dental extractions about 4 days ago, who presented to the hospital with complaints of fever, tachycardia, elevated lactic acid level, cough, lethargy. Patient herself isn't able to provide no history. No family is available in the room during evaluation.  Review of Systems  Unable to perform ROS: Language    VITAL SIGNS: Blood pressure 126/77, pulse 100, temperature 98.4 F (36.9 C), temperature source Axillary, resp. rate 18, height 5' (1.524 m), weight 37.6 kg (83 lb), SpO2 94 %.  PHYSICAL EXAMINATION:   GENERAL:  41 y.o.-year-old patient lying in the bed in no acute distress. Patient does have Kussmaul's type breathing with some intermittent dyspnea/tachypnea and then calming down. EYES: Pupils equal, round, reactive to light and accommodation. No scleral icterus. Extraocular muscles intact.  HEENT: Head atraumatic, normocephalic. Oropharynx and nasopharynx clear. Grimacing, some facial asymmetry, no weakness NECK:  Supple, no jugular venous distention. No thyroid enlargement, no tenderness.  LUNGS: Normal breath sounds bilaterally, no wheezing, rales,rhonchi or crepitation. No use of accessory muscles of respiration, except of episodes of Kussmaul's type breathing.  CARDIOVASCULAR: S1, S2 , rhythm is regular. No murmurs, rubs, or gallops.  ABDOMEN: Soft, nontender, nondistended. Bowel sounds present. No organomegaly or mass.  EXTREMITIES: No pedal edema, cyanosis, or clubbing.  NEUROLOGIC: Cranial nerves II through XII are intact. Muscle strength 5/5 in all extremities. Sensation intact. Gait not checked.   PSYCHIATRIC: The patient is alert , nonverbal, not able to assess orientation, does not follow commands  SKIN: No obvious rash, lesion, or ulcer.   ORDERS/RESULTS REVIEWED:   CBC  Recent Labs Lab 05/11/16 0614 05/12/16 0409  WBC 4.7 3.1*  HGB 13.0 10.4*  HCT 41.3 32.7*  PLT 100* 94*  MCV 80.6 79.9*  MCH 25.3* 25.5*  MCHC 31.4* 31.9*  RDW 19.9* 19.8*  LYMPHSABS 0.9*  --   MONOABS 0.5  --   EOSABS 0.2  --   BASOSABS 0.0  --    ------------------------------------------------------------------------------------------------------------------  Chemistries   Recent Labs Lab 05/11/16 0614 05/12/16 0409  NA 137 139  K 3.7 3.8  CL 102 114*  CO2 23 20*  GLUCOSE 103* 83  BUN 10 10  CREATININE 0.50 0.47  CALCIUM 9.4 8.0*  AST 36 22  ALT 20 15  ALKPHOS 66 67  BILITOT 0.4 0.2*   ------------------------------------------------------------------------------------------------------------------ estimated creatinine clearance is 54.9 mL/min (by C-G formula based on SCr of 0.47 mg/dL). ------------------------------------------------------------------------------------------------------------------ No results for input(s): TSH, T4TOTAL, T3FREE, THYROIDAB in the last 72 hours.  Invalid input(s): FREET3  Cardiac Enzymes  Recent Labs Lab 05/11/16 0614  TROPONINI <0.03   ------------------------------------------------------------------------------------------------------------------ Invalid input(s): POCBNP ---------------------------------------------------------------------------------------------------------------  RADIOLOGY: Dg Chest 2 View  Result Date: 05/11/2016 CLINICAL DATA:  41 year old female status post Oral surgery 3 days ago with decreased mental status this morning. Rett syndrome. Initial encounter. EXAM: CHEST  2 VIEW COMPARISON:  05/24/2015 and earlier. FINDINGS: Seated AP and lateral views of the chest. Lung volumes are within normal limits in both lungs  appear clear. Normal cardiac size and mediastinal contours. Visualized tracheal air column is within normal limits. Scoliosis with posterior spinal rods. Stable visible bowel gas pattern with  gas-filled but nondilated loops. IMPRESSION: No acute cardiopulmonary abnormality. Electronically Signed   By: Odessa Fleming M.D.   On: 05/11/2016 08:20   Ct Chest Wo Contrast  Result Date: 05/12/2016 CLINICAL DATA:  Dyspnea. EXAM: CT CHEST WITHOUT CONTRAST TECHNIQUE: Multidetector CT imaging of the chest was performed following the standard protocol without IV contrast. COMPARISON:  Radiograph of May 11, 2016. FINDINGS: Cardiovascular: No significant vascular findings. Normal heart size. No pericardial effusion. Mediastinum/Nodes: No enlarged mediastinal or axillary lymph nodes. Thyroid gland, trachea, and esophagus demonstrate no significant findings. Lungs/Pleura: Lungs are clear. No pleural effusion or pneumothorax. Upper Abdomen: No acute abnormality. Musculoskeletal: Status post surgical fusion of thoracic spine for treatment of scoliosis. IMPRESSION: No significant abnormality seen in the chest. Electronically Signed   By: Lupita Raider, M.D.   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 Found to be difficult to arouse yesterday by father. Initial encounter. EXAM: CT PARANASAL SINUS LIMITED WITHOUT CONTRAST TECHNIQUE: Non-contiguous multidetector CT images of the paranasal sinuses were obtained in a single plane without contrast. COMPARISON:  04/25/2013 head CT. FINDINGS: Examination significantly limited by motion degradation. Extraction of upper teeth. Cortical interruption at the level of the upper left lateral incisor socket with adjacent soft tissue inflammation which may represent postprocedure cellulitis. Partial opacification maxillary sinuses bilaterally with extension of posterior molar  into the maxillary sinus. No intraorbital extension of inflammatory process detected. Evaluation for floor of mouth or palatine tonsil abnormality limited by motion and patient positioning (tongue displaced to the right). No obvious parapharyngeal abscess. Reactive lymph nodes suspected. Intracranial atrophy as noted on prior CT. Mucosal thickening right sphenoid sinus. Partial opacification with bubbly air-fluid level left sphenoid sinus. Mucosal thickening ethmoid sinus air cells bilaterally. IMPRESSION: Examination significantly limited by motion degradation. Extraction of upper teeth. Cortical interruption at the level of the upper left lateral incisor socket with adjacent soft tissue inflammation which may represent postprocedure cellulitis. Partial opacification maxillary sinuses bilaterally with extension of posterior molar into the maxillary sinus. Evaluation for floor of mouth or palatine tonsil abnormality limited by motion and patient positioning (tongue displaced to the right). No obvious parapharyngeal abscess. Mucosal thickening right sphenoid sinus. Partial opacification with bubbly air-fluid level left sphenoid sinus. Mucosal thickening ethmoid sinus air cells bilaterally. Chest CT performed same date is dictated separately. Electronically Signed   By: Lacy Duverney M.D.   On: 05/12/2016 10:07    EKG:  Orders placed or performed during the hospital encounter of 05/11/16  . EKG 12-Lead  . EKG 12-Lead    ASSESSMENT AND PLAN:  Principal Problem:   SIRS (systemic inflammatory response syndrome) (HCC) Active Problems:   Dental infection   Rett syndrome   Seizure disorder (HCC)   Sepsis (HCC)  #1 SIRS , initiate patient doxycycline to cover inflammatory changes in dental area after extractions, bronchitis, questionable aspirations, blood cultures are negative so far, respiratory cultures if possible, urine cultures pending #2. Recent dental extractions, questionable postprocedure  cellulitis on CT, no abscesses of loculated fluid collections were noted on CT of maxillofacial area, continue doxycycline #3. Cough, questionable acute bronchitis versus aspiration, get speech therapist to see patient in consultation, dysphagia 1 diet with thin liquids . Chest x-ray as well as chest CT were unremarkable  #4. Pancytopenia, questionable viral disease, repeat CBC in the morning, influenza test was negative #5. Seizure disorder, continue Gabitril and  Tegretol  #6. Abnormal be breathing pattern, chronic, according to patient's family #5. Iron deficiency anemia, initiate iron supplementation orally, get Hemoccult, will need to be followed by gastroenterologist as outpatient, is unclear if the patient still has menstrual periods   Management plans discussed with the patient, family and they are in agreement.   DRUG ALLERGIES:  Allergies  Allergen Reactions  . Benadryl [Diphenhydramine]   . Penicillins     CODE STATUS:     Code Status Orders        Start     Ordered   05/11/16 1458  Full code  Continuous     05/11/16 1457    Code Status History    Date Active Date Inactive Code Status Order ID Comments User Context   This patient has a current code status but no historical code status.      TOTAL TIME TAKING CARE OF THIS PATIENT: 40  minutes.    Katharina Caper M.D on 05/12/2016 at 4:30 PM  Between 7am to 6pm - Pager - [phone]  After 6pm go to [email or web address] - password [email or web address] [facility]  [facility]  Office  [phone]  CC: Primary care physician; [name], MD

## 2016-05-12 NOTE — Evaluation (Signed)
Clinical/Bedside Swallow Evaluation Patient Details  Name: Alicia Duncan MRN: [id number] Date of Birth: Aug 22, 1975  Today's Date: 05/12/2016 Time: SLP Start Time (ACUTE ONLY): 1015 SLP Stop Time (ACUTE ONLY): 1100 SLP Time Calculation (min) (ACUTE ONLY): 45 min  Past Medical History:  Past Medical History:  Diagnosis Date  . Dystonia   . Rett syndrome   . Seizures (HCC)    Past Surgical History: History reviewed. No pertinent surgical history. HPI:  Pt is a 41 y.o. female has a past medical history significant for Rett syndrome and seizures who underwent multiple dental extractions 4 days ago now with fever, tachycardia, and elevated lactic acid c/w SIRS. Pt was brought to ER with lethargy and cough. She is now admitted. Family states pt has daily seizures. Per chart review and NSG notes, pt currently recieves liquid intake from baby bottle, and is on dysphagia 1 diet (puree).    Assessment / Plan / Recommendation Clinical Impression  Pt currently appears at reduced risk for aspiration following general aspiration precautions and with feeding support with thin liquids and puree diet. No overt s/sx of aspiration noted with thin liquid via bottle (baby) which is her baseline and puree trials via spoon when follwing aspiration precautions (small sips, reducing distractions, positioning upright at 90 degrees). Pt's change in respiration noted upon enterance to room as well as any additional enterance to room. No change in respiration noted with po trials. Pt was able to self feed bottle. Pt also able to guide SLP's hand towards mouth for trials of puree. Mild oral phase deficits noted c/b min anterior spillage, reduced labial seal and lingual movements. NSG educated on aspiration precautions and note of using her own bottle for liquid administration (more familiar to pt).  Recommend pt remain on dysphagia 1 diet with thin liquids following aspiration precautions.     Aspiration Risk  Mild  aspiration risk (reduced)    Diet Recommendation   Dysphagia 1 diet with thin liquids via pt's bottle (baby) following aspiration precautions. Supervision at all meals.   Medication Administration: Crushed with puree    Other  Recommendations Recommended Consults:  (dietitician) Oral Care Recommendations: Oral care BID;Staff/trained caregiver to provide oral care Other Recommendations:  (Use PT's bottle for liquid administration)   Follow up Recommendations   none indicated at this time. NSG to re-consult if any change from baseline status.     Frequency and Duration            Prognosis Prognosis for Safe Diet Advancement: Good      Swallow Study   General Date of Onset: 05/11/16 HPI: Pt is a 41 y.o. female has a past medical history significant for Rett syndrome and seizures who underwent multiple dental extractions 4 days ago now with fever, tachycardia, and elevated lactic acid c/w SIRS. Pt was brought to ER with lethargy and cough. She is now admitted. Family states pt has daily seizures. Per chart review and NSG notes, pt currently recieves liquid intake from baby bottle, and is on dysphagia 1 diet (puree).  Type of Study: Bedside Swallow Evaluation Previous Swallow Assessment: none noted Diet Prior to this Study: Dysphagia 1 (puree);Thin liquids Temperature Spikes Noted: No Respiratory Status: Room air History of Recent Intubation: No Behavior/Cognition: Alert;Pleasant mood;Cooperative;Impulsive;Distractible;Requires cueing Oral Cavity Assessment: Within Functional Limits Oral Care Completed by SLP: Recent completion by staff Oral Cavity - Dentition: Missing dentition (Missing all denition ) Vision: Functional for self-feeding Self-Feeding Abilities: Able to feed self;Needs set up;Needs assist (Pt  able to self-feed baby bottle) Patient Positioning: Upright in bed;Postural control adequate for testing Baseline Vocal Quality:  (no vocalizations noted) Volitional Cough:   (none noted) Volitional Swallow:  (dnt)    Oral/Motor/Sensory Function Overall Oral Motor/Sensory Function: Mild impairment (lingual/labial overall, constant licking movements) Facial ROM: Within Functional Limits Facial Symmetry: Within Functional Limits Facial Strength: Within Functional Limits Lingual ROM: Within Functional Limits Lingual Sensation: Within Functional Limits Mandible: Within Functional Limits   Ice Chips Ice chips: Not tested   Thin Liquid Thin Liquid: Impaired Presentation:  (baby bottle) Oral Phase Impairments: Reduced lingual movement/coordination;Reduced labial seal Oral Phase Functional Implications:  (anterior spillage) Pharyngeal  Phase Impairments:  (none)    Nectar Thick Nectar Thick Liquid: Not tested   Honey Thick Honey Thick Liquid: Not tested   Puree Puree: Impaired Presentation: Spoon (X5 trials) Oral Phase Impairments: Reduced labial seal;Reduced lingual movement/coordination Oral Phase Functional Implications: Oral residue (-min) Pharyngeal Phase Impairments:  (none)   Solid   GO   Solid: Not tested        Alicia Duncan, B.S Graduate Clinician  05/12/2016,1:46 PM   This information has been reviewed and agreed upon by this supervising clinician.  Alicia SomKatherine Hildreth Orsak, MS, CCC-SLP

## 2016-05-12 NOTE — Care Management (Addendum)
Admitted to [facility] with the diagnosis of SIRS. Lives with parents. Father is Harvie HeckRandy [phone]). Mother had stroke a year ago , so father is caregiver. Last seen Dr, Burnett ShengHedrick 01/21/16. Dental surgery 05/08/16. Dr. Rolene ArbourJane Boggs is Ms. Kellman[facility]. Next appointment is in March. Receives services per [facility] [phone]). 4 hours a week x 3 days. Skilled Nursing facility many years ago following spinal surgery. No home oxygen. Bed rails on regular bed. Stroller and wheelchair in the home. No falls. Eats when she wants to. Father will transport. Gwenette GreetBrenda S Leani Myron RN MSN CCM Care Management

## 2016-05-12 NOTE — Consult Note (Signed)
Reason for Consult:Seizures Referring Physician: Winona Legato  CC: Seizures  HPI: Alicia Duncan is an 41 y.o. female with a history of Rett's and intractable seizures who presented on yesterday with suspected SIRS s/p dental extraction.  Patient unable to provide any history.  Family not present.  All history obtained from the chart.  It appears that the patient has seizures on a daily basis.     Past Medical History:  Diagnosis Date  . Dystonia   . Rett syndrome   . Seizures (HCC)     Surgical history: Posterior fusion spinal deformity  Family history: Parents the HTN.  Mother has had a recent stroke.  Brother with schizophrenia.    Social History:  reports that she has never smoked. She has never used smokeless tobacco. She reports that she does not drink alcohol or use drugs.  Allergies  Allergen Reactions  . Benadryl [Diphenhydramine]   . Penicillins     Medications:  I have reviewed the patient's current medications. Prior to Admission:  Prescriptions Prior to Admission  Medication Sig Dispense Refill Last Dose  . amoxicillin (AMOXIL) 400 MG/5ML suspension Take 400 mg by mouth 3 (three) times daily.   05/10/2016 at Unknown time  . baclofen (LIORESAL) 10 MG tablet Take 10 mg by mouth 3 (three) times daily.   05/10/2016 at pm  . carbamazepine (CARBATROL) 300 MG 12 hr capsule Take 300 mg by mouth 2 (two) times daily.   05/10/2016 at pm  . ferrous sulfate 300 (60 Fe) MG/5ML syrup Take 300 mg by mouth daily.   Past Week at Unknown time  . FLUoxetine (PROZAC) 10 MG tablet Take 10 mg by mouth daily.   05/10/2016 at am  . omeprazole (PRILOSEC) 20 MG capsule Take 20 mg by mouth daily.   05/10/2016 at am  . tiaGABine (GABITRIL) 2 MG tablet Take 1 tablet (2 mg total) by mouth 3 (three) times daily. 90 tablet 2 05/10/2016 at pm   Scheduled: . baclofen  10 mg Oral TID  . carbamazepine  300 mg Oral BID  . clindamycin (CLEOCIN) IV  600 mg Intravenous Q8H  . docusate sodium  100 mg Oral BID  .  ferrous sulfate  60 mg of iron Oral Daily  . FLUoxetine  10 mg Oral Daily  . heparin  5,000 Units Subcutaneous Q8H  . mouth rinse  15 mL Mouth Rinse BID  . pantoprazole  40 mg Oral Daily  . sodium chloride flush  3 mL Intravenous Q12H  . tiaGABine  2 mg Oral TID    ROS: Patient nonverbal and unable to provide  Physical Examination: Blood pressure (!) 118/57, pulse 88, temperature 97.9 F (36.6 C), temperature source Axillary, resp. rate 20, height 5' (1.524 m), weight 37.6 kg (83 lb), SpO2 98 %.  HEENT-  Normocephalic, no lesions, without obvious abnormality.  Normal external eye and conjunctiva.  Normal TM's bilaterally.  Normal auditory canals and external ears. Normal external nose, mucus membranes and septum.  Normal pharynx. Cardiovascular- S1, S2 normal, pulses palpable throughout   Lungs- chest clear, no wheezing, rales, normal symmetric air entry Abdomen- soft, non-tender; bowel sounds normal; no masses,  no organomegaly Extremities- no edema Lymph-no adenopathy palpable Musculoskeletal-no joint tenderness, deformity or swelling Skin-warm and dry, no hyperpigmentation, vitiligo, or suspicious lesions  Neurological Examination Mental Status: Patient alert and sitting in bed.  Nonverbal.  Does not follow commands.   Cranial Nerves: II: Blinks to confrontation bilaterally, pupils right 3 mm, left 3 mm,and  reactive bilaterally III,IV,VI: EOM's grossly intact V,VII: Face symmetric VIII: patient does not respond to verbal stimuli IX,X: gag reflex unable to be tested, XI: trapezius strength unable to test bilaterally XII: tongue strength unable to test Motor: Patient able to move all extremities Deep Tendon Reflexes:  2+ with absent AJ's Plantars: mute bilaterally Cerebellar: Unable to perform      Laboratory Studies:   Basic Metabolic Panel:  Recent Labs Lab 05/11/16 0614 05/12/16 0409  NA 137 139  K 3.7 3.8  CL 102 114*  CO2 23 20*  GLUCOSE 103* 83  BUN  10 10  CREATININE 0.50 0.47  CALCIUM 9.4 8.0*    Liver Function Tests:  Recent Labs Lab 05/11/16 0614 05/12/16 0409  AST 36 22  ALT 20 15  ALKPHOS 66 67  BILITOT 0.4 0.2*  PROT 8.0 6.2*  ALBUMIN 4.3 3.1*    Recent Labs Lab 05/11/16 0614  LIPASE 50   No results for input(s): AMMONIA in the last 168 hours.  CBC:  Recent Labs Lab 05/11/16 0614 05/12/16 0409  WBC 4.7 3.1*  NEUTROABS 3.1  --   HGB 13.0 10.4*  HCT 41.3 32.7*  MCV 80.6 79.9*  PLT 100* 94*    Cardiac Enzymes:  Recent Labs Lab 05/11/16 0614  TROPONINI <0.03    BNP: Invalid input(s): POCBNP  CBG: No results for input(s): GLUCAP in the last 168 hours.  Microbiology: Results for orders placed or performed during the hospital encounter of 05/11/16  Blood Culture (routine x 2)     Status: None (Preliminary result)   Collection Time: 05/11/16  8:39 AM  Result Value Ref Range Status   Specimen Description BLOOD LEFT ASSIST CONTROL  Final   Special Requests BOTTLES DRAWN AEROBIC AND ANAEROBIC ANA8ML AER11ML  Final   Culture NO GROWTH < 24 HOURS  Final   Report Status PENDING  Incomplete  Blood Culture (routine x 2)     Status: None (Preliminary result)   Collection Time: 05/11/16  8:39 AM  Result Value Ref Range Status   Specimen Description BLOOD RIGHT ASSIST CONTROL  Final   Special Requests BOTTLES DRAWN AEROBIC AND ANAEROBIC ANA6ML AER8ML  Final   Culture NO GROWTH < 24 HOURS  Final   Report Status PENDING  Incomplete    Coagulation Studies:  Recent Labs  05/11/16 0614  LABPROT 12.3  INR 0.92    Urinalysis:  Recent Labs Lab 05/11/16 0614  COLORURINE YELLOW*  LABSPEC 1.019  PHURINE 7.0  GLUCOSEU NEGATIVE  HGBUR NEGATIVE  BILIRUBINUR NEGATIVE  KETONESUR NEGATIVE  PROTEINUR NEGATIVE  NITRITE NEGATIVE  LEUKOCYTESUR NEGATIVE    Lipid Panel:  No results found for: CHOL, TRIG, HDL, CHOLHDL, VLDL, LDLCALC  HgbA1C: No results found for: HGBA1C  Urine Drug Screen:      Component Value Date/Time   LABOPIA NONE DETECTED 05/11/2016 0614   COCAINSCRNUR NONE DETECTED 05/11/2016 0614   LABBENZ NONE DETECTED 05/11/2016 0614   AMPHETMU NONE DETECTED 05/11/2016 0614   THCU NONE DETECTED 05/11/2016 0614   LABBARB NONE DETECTED 05/11/2016 0614    Alcohol Level: No results for input(s): ETH in the last 168 hours.  Other results: EKG: sinus tachycardia at 109 bpm.  Imaging: Dg Chest 2 View  Result Date: 05/11/2016 CLINICAL DATA:  41 year old female status post Oral surgery 3 days ago with decreased mental status this morning. Rett syndrome. Initial encounter. EXAM: CHEST  2 VIEW COMPARISON:  05/24/2015 and earlier. FINDINGS: Seated AP and lateral views of the  chest. Lung volumes are within normal limits in both lungs appear clear. Normal cardiac size and mediastinal contours. Visualized tracheal air column is within normal limits. Scoliosis with posterior spinal rods. Stable visible bowel gas pattern with gas-filled but nondilated loops. IMPRESSION: No acute cardiopulmonary abnormality. Electronically Signed   By: Odessa FlemingH  Hall M.D.   On: 05/11/2016 08:20   Ct Chest Wo Contrast  Result Date: 05/12/2016 CLINICAL DATA:  Dyspnea. EXAM: CT CHEST WITHOUT CONTRAST TECHNIQUE: Multidetector CT imaging of the chest was performed following the standard protocol without IV contrast. COMPARISON:  Radiograph of May 11, 2016. FINDINGS: Cardiovascular: No significant vascular findings. Normal heart size. No pericardial effusion. Mediastinum/Nodes: No enlarged mediastinal or axillary lymph nodes. Thyroid gland, trachea, and esophagus demonstrate no significant findings. Lungs/Pleura: Lungs are clear. No pleural effusion or pneumothorax. Upper Abdomen: No acute abnormality. Musculoskeletal: Status post surgical fusion of thoracic spine for treatment of scoliosis. IMPRESSION: No significant abnormality seen in the chest. Electronically Signed   By: Lupita RaiderJames  Green Jr, M.D.   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 Found to be difficult to arouse yesterday by father. Initial encounter. EXAM: CT PARANASAL SINUS LIMITED WITHOUT CONTRAST TECHNIQUE: Non-contiguous multidetector CT images of the paranasal sinuses were obtained in a single plane without contrast. COMPARISON:  04/25/2013 head CT. FINDINGS: Examination significantly limited by motion degradation. Extraction of upper teeth. Cortical interruption at the level of the upper left lateral incisor socket with adjacent soft tissue inflammation which may represent postprocedure cellulitis. Partial opacification maxillary sinuses bilaterally with extension of posterior molar into the maxillary sinus. No intraorbital extension of inflammatory process detected. Evaluation for floor of mouth or palatine tonsil abnormality limited by motion and patient positioning (tongue displaced to the right). No obvious parapharyngeal abscess. Reactive lymph nodes suspected. Intracranial atrophy as noted on prior CT. Mucosal thickening right sphenoid sinus. Partial opacification with bubbly air-fluid level left sphenoid sinus. Mucosal thickening ethmoid sinus air cells bilaterally. IMPRESSION: Examination significantly limited by motion degradation. Extraction of upper teeth. Cortical interruption at the level of the upper left lateral incisor socket with adjacent soft tissue inflammation which may represent postprocedure cellulitis. Partial opacification maxillary sinuses bilaterally with extension of posterior molar into the maxillary sinus. Evaluation for floor of mouth or palatine tonsil abnormality limited by motion and patient positioning (tongue displaced to the right). No obvious parapharyngeal abscess. Mucosal thickening right sphenoid sinus. Partial opacification with bubbly air-fluid level left sphenoid sinus. Mucosal thickening ethmoid sinus  air cells bilaterally. Chest CT performed same date is dictated separately. Electronically Signed   By: Lacy DuverneySteven  Olson M.D.   On: 05/12/2016 10:07     Assessment/Plan: 41 year old female presenting with SIRS.  Has a history of Rett's and intractable seizures on Carbatrol and Gabitril.  [facility]  Recommendations: 1.  Continue home AED's at home doses 2.  Would not treat breakthrough seizures with Ativan unless seizures are prolonged.   3.  Seizure precautions  Thana FarrLeslie Cuahutemoc Attar, MD Neurology [phone] 05/12/2016, 10:39 AM

## 2016-05-12 NOTE — Progress Notes (Signed)
Initial Nutrition Assessment  DOCUMENTATION CODES:   Underweight  INTERVENTION:  Provide Ensure Enlive po TID with meals, each supplement provides 350 kcal and 20 grams of protein. Patient prefers vanilla or strawberry and drinks Ensure through baby bottle.  Recommend checking folate (WB erythrocyte) and serum vitamin B12 levels per guidelines for patient's with Rett syndrome where >50% of diet is from oral nutrition supplement or tube feeding. Discussed with Dr. Winona LegatoVaickute and labs have been ordered.  Will discuss with patient's father regarding recommendation to gain weight to 43 kg in order to be normal body weight (as opposed to underweight). Recommend addition of another small meal daily or another Ensure Plus at home.  NUTRITION DIAGNOSIS:   Underweight related to chronic illness, dysphagia, other (see comment) (eating difficulties due to Rett syndrome) as evidenced by other (see comment) (BMI 16.2).  GOAL:   Patient will meet greater than or equal to 90% of their needs, Weight gain  MONITOR:   PO intake, Supplement acceptance, Labs, I & O's, Weight trends  REASON FOR ASSESSMENT:   Consult, Low Braden Assessment of nutrition requirement/status  ASSESSMENT:   41 y.o. female has a past medical history significant for Rett syndrome and seizures who underwent multiple dental extractions 4 days ago now with fever, tachycardia, and elevated lactic acid c/w SIRS.   Patient is nonverbal. Spoke with patient's father on the phone. He reports patient requires full assist at meals. She drinks all liquids through a baby bottle. She typically drinks 3 Ensure Plus daily (350 kcal and 13 grams of protein per bottle - total of 1050 kcal and 39 grams of protein) and eats very little. She typically eats a meal at dinner time that may be a [employer] Meal, flavored yogurt, vanilla or butter pecan pudding, or cream of wheat with sugar and butter. Patient also will drink cranberry grape juice,  another type of fruit juice, or water between meals. Patient does not take a multivitamin with minerals daily but he does take a liquid iron supplement in applesauce daily due to history of iron deficiency. Dental extractions on 05/08/2016 (last remaining 7 teeth extracted so patient now with no teeth).  UBW is around 85 lbs. [facility], which is lower than usual. Per literature review, patients with Rett syndrome typically experience poor growth. In the pediatric population, a good clinical guideline for growth is the 25th percentile for BMI-for-age. Other than that, it is recommended to estimate calorie needs above recommendations for weight until patient is normal body weight (BMI >/= 18.5). Per this guideline, a good weight goal for patient would be approximately 43 kg (94.6 lbs).  Medications reviewed and include: Colace, ferrous sulfate 60 mg daily, pantoprazole, NS @ 100 ml/hr.  Labs reviewed: Chloride 114, CO2 20.  Nutrition-Focused physical exam completed. Findings are no fat depletion, no muscle depletion, and moderate edema. No obvious fat depletion and low muscle tone expected in this population per literature review.  Discussed with RN.  Diet Order:  DIET - DYS 1 Room service appropriate? Yes with Assist; Fluid consistency: Thin  Skin:  Reviewed, no issues  Last BM:  05/11/2016  Height:   Ht Readings from Last 1 Encounters:  05/11/16 5' (1.524 m)    Weight:   Wt Readings from Last 1 Encounters:  05/12/16 83 lb (37.6 kg)    Ideal Body Weight:  45.5 kg  BMI:  Body mass index is 16.21 kg/m.  Estimated Nutritional Needs:  Kcal:  1245-1400 (HBE x 1.15-1.3)  Protein:  45-53 grams (1.2-1.4 grams/kg)  Fluid:  1.1-1.3 L/day (30-35 ml/kg)  EDUCATION NEEDS:   Education needs addressed  Helane [name], MS, RD, LDN Pager: [phone] After Hours Pager: [phone]

## 2016-05-13 DIAGNOSIS — D61818 Other pancytopenia: Secondary | ICD-10-CM

## 2016-05-13 DIAGNOSIS — D509 Iron deficiency anemia, unspecified: Secondary | ICD-10-CM

## 2016-05-13 DIAGNOSIS — R05 Cough: Secondary | ICD-10-CM

## 2016-05-13 DIAGNOSIS — R059 Cough, unspecified: Secondary | ICD-10-CM

## 2016-05-13 LAB — URINE CULTURE: CULTURE: NO GROWTH

## 2016-05-13 LAB — CBC
HCT: 33.5 % — ABNORMAL LOW (ref 35.0–47.0)
Hemoglobin: 10.7 g/dL — ABNORMAL LOW (ref 12.0–16.0)
MCH: 25.8 pg — AB (ref 26.0–34.0)
MCHC: 32 g/dL (ref 32.0–36.0)
MCV: 80.6 fL (ref 80.0–100.0)
Platelets: 105 10*3/uL — ABNORMAL LOW (ref 150–440)
RBC: 4.15 MIL/uL (ref 3.80–5.20)
RDW: 20.4 % — AB (ref 11.5–14.5)
WBC: 3.5 10*3/uL — ABNORMAL LOW (ref 3.6–11.0)

## 2016-05-13 LAB — CO2, TOTAL: CO2: 23 mmol/L (ref 22–32)

## 2016-05-13 LAB — VITAMIN B12: VITAMIN B 12: 880 pg/mL (ref 180–914)

## 2016-05-13 MED ORDER — FERROUS SULFATE 220 (44 FE) MG/5ML PO ELIX
60.0000 mg | ORAL_SOLUTION | Freq: Three times a day (TID) | ORAL | 3 refills | Status: DC
Start: 2016-05-13 — End: 2018-09-12

## 2016-05-13 MED ORDER — DOXYCYCLINE CALCIUM 50 MG/5ML PO SYRP: 100.0000 mg | ORAL_SOLUTION | Freq: Two times a day (BID) | ORAL | 0 refills | Status: DC

## 2016-05-13 MED ORDER — ENSURE ENLIVE PO LIQD: 237.0000 mL | Freq: Three times a day (TID) | ORAL | 12 refills | Status: DC

## 2016-05-13 NOTE — Care Management Important Message (Signed)
Important Message  Patient Details  Name: Alicia KohlerHeather M Duncan MRN: [id number] Date of Birth: 05/11/1975   Medicare Important Message Given:  Yes    Gwenette GreetBrenda S Nishawn Rotan, RN 05/13/2016, 8:33 AM

## 2016-05-13 NOTE — Discharge Summary (Signed)
[facility]   PATIENT NAME: Alicia Duncan    MR#:  [id number]  DATE OF BIRTH:  [birth year]  DATE OF ADMISSION:  05/11/2016 ADMITTING PHYSICIAN: Marguarite Arbour, MD  DATE OF DISCHARGE: 05/13/2016  2:11 PM  PRIMARY CARE PHYSICIAN: [name], MD     ADMISSION DIAGNOSIS:  Sepsis, due to unspecified organism (HCC) [A41.9]  DISCHARGE DIAGNOSIS:  Principal Problem:   SIRS (systemic inflammatory response syndrome) (HCC) Active Problems:   Dental infection   Sepsis (HCC)   Cough   Pancytopenia (HCC)   Iron deficiency anemia   Rett syndrome   Seizure disorder (HCC)   SECONDARY DIAGNOSIS:   Past Medical History:  Diagnosis Date  . Dystonia   . Rett syndrome   . Seizures (HCC)     .pro HOSPITAL COURSE:   atient is 41 year old Caucasian female with medical history significant for history of Rett syndrome, seizures, who underwent multiple dental extractions about 4 days ago PTA, who presented to the hospital with complaints of fever, tachycardia, elevated lactic acid level, cough, lethargy. Patient herself isn't able to provide no history. Patient's labs revealed elevated lactic acid level to 2.3, but no leukocytosis. Influenza test was negative. Urinalysis was unremarkable. Chest x-ray as well as CT of the chest were normal. CT of maxillofacial area revealed partial opacification of maxillary sinuses, upper left lateral jaw tissue inflammation after teeth extraction, other sinus mucosal inflammation. Patient was initiated on doxycycline and her condition improved. She was felt to be stable to be discharged home. Discussion by problem: #1 SIRS , continue patient on doxycycline to complete course to cover inflammatory changes in dental area after extractions, bronchitis, questionable aspiration, blood cultures were negative . Lactic acid level improved  #2. Recent dental extractions, questionable postprocedure cellulitis on CT,  sinusitis, no abscesses or loculated fluid collections were noted on CT of maxillofacial area, continue doxycycline to complete course #3. Cough, questionable acute bronchitis versus aspiration, the patient was seen by speech therapist and recommended dysphagia 1 diet with thin liquids to decrease aspiration risks . Chest x-ray as well as chest CT were unremarkable , supportive therapy with cough syrup, could be bought as over-the-counter #4. Pancytopenia, questionable viral disease, repeated CBC today showed a mild improvement, influenza test was negative #5. Seizure disorder, continue Gabitril and Tegretol , patient was seen by neurologist, no further interventions were recommended #6. Abnormal breathing pattern, chronic, according to patient's family, no interventions #5. Iron deficiency anemia, continue patient on iron supplementation orally, Hemoccult was ordered, not received,, the patient will need to be followed by gastroenterologist as outpatient, is unclear if the patient still has menstrual periods, follow hemoglobin level as outpatient, stool cards DISCHARGE CONDITIONS:   Stable  CONSULTS OBTAINED:  Treatment Team:  Pauletta Browns, MD  DRUG ALLERGIES:   Allergies  Allergen Reactions  . Benadryl [Diphenhydramine]   . Penicillins     DISCHARGE MEDICATIONS:   Discharge Medication List as of 05/13/2016  1:26 PM    START taking these medications   Details  doxycycline (VIBRAMYCIN) 50 MG/5ML SYRP Take 10 mLs (100 mg total) by mouth 2 (two) times daily., Starting Tue 05/13/2016, Normal    feeding supplement, ENSURE ENLIVE, (ENSURE ENLIVE) LIQD Take 237 mLs by mouth 3 (three) times daily with meals., Starting Tue 05/13/2016, Normal    ferrous sulfate 220 (44 Fe) MG/5ML solution Take 6.8 mLs (60 mg of iron total) by mouth 3 (three) times daily with meals., Starting Tue  05/13/2016, Normal      CONTINUE these medications which have NOT CHANGED   Details  baclofen (LIORESAL) 10 MG  tablet Take 10 mg by mouth 3 (three) times daily., Historical Med    carbamazepine (CARBATROL) 300 MG 12 hr capsule Take 300 mg by mouth 2 (two) times daily., Historical Med    FLUoxetine (PROZAC) 10 MG tablet Take 10 mg by mouth daily., Historical Med    omeprazole (PRILOSEC) 20 MG capsule Take 20 mg by mouth daily., Historical Med    tiaGABine (GABITRIL) 2 MG tablet Take 1 tablet (2 mg total) by mouth 3 (three) times daily., Starting Thu 08/09/2015, Print      STOP taking these medications     amoxicillin (AMOXIL) 400 MG/5ML suspension      ferrous sulfate 300 (60 Fe) MG/5ML syrup          DISCHARGE INSTRUCTIONS:    The patient is to follow-up with primary care physician as outpatient  If you experience worsening of your admission symptoms, develop shortness of breath, life threatening emergency, suicidal or homicidal thoughts you must seek medical attention immediately by calling 911 or calling your MD immediately  if symptoms less severe.  You Must read complete instructions/literature along with all the possible adverse reactions/side effects for all the Medicines you take and that have been prescribed to you. Take any new Medicines after you have completely understood and accept all the possible adverse reactions/side effects.   Please note  You were cared for by a hospitalist during your hospital stay. If you have any questions about your discharge medications or the care you received while you were in the hospital after you are discharged, you can call the unit and asked to speak with the hospitalist on call if the hospitalist that took care of you is not available. Once you are discharged, your primary care physician will handle any further medical issues. Please note that NO REFILLS for any discharge medications will be authorized once you are discharged, as it is imperative that you return to your primary care physician (or establish a relationship with a primary care  physician if you do not have one) for your aftercare needs so that they can reassess your need for medications and monitor your lab values.    Today   CHIEF COMPLAINT:   Chief Complaint  Patient presents with  . Altered Mental Status    HISTORY OF PRESENT ILLNESS:  Summar Mcglothlin  is a 41 y.o. female with a known history of Rett syndrome, seizures, who underwent multiple dental extractions about 4 days ago PTA, who presented to the hospital with complaints of fever, tachycardia, elevated lactic acid level, cough, lethargy. Patient herself isn't able to provide no history. Patient's labs revealed elevated lactic acid level to 2.3, but no leukocytosis. Influenza test was negative. Urinalysis was unremarkable. Chest x-ray as well as CT of the chest were normal. CT of maxillofacial area revealed partial opacification of maxillary sinuses, upper left lateral jaw tissue inflammation after teeth extraction, other sinus mucosal inflammation. Patient was initiated on doxycycline and her condition improved. She was felt to be stable to be discharged home. Discussion by problem: #1 SIRS , continue patient on doxycycline to complete course to cover inflammatory changes in dental area after extractions, bronchitis, questionable aspiration, blood cultures were negative . Lactic acid level improved  #2. Recent dental extractions, questionable postprocedure cellulitis on CT, sinusitis, no abscesses or loculated fluid collections were noted on CT of maxillofacial area,  continue doxycycline to complete course #3. Cough, questionable acute bronchitis versus aspiration, the patient was seen by speech therapist and recommended dysphagia 1 diet with thin liquids to decrease aspiration risks . Chest x-ray as well as chest CT were unremarkable , supportive therapy with cough syrup, could be bought as over-the-counter #4. Pancytopenia, questionable viral disease, repeated CBC today showed a mild improvement, influenza  test was negative #5. Seizure disorder, continue Gabitril and Tegretol , patient was seen by neurologist, no further interventions were recommended #6. Abnormal breathing pattern, chronic, according to patient's family, no interventions #5. Iron deficiency anemia, continue patient on iron supplementation orally, Hemoccult was ordered, not received,, the patient will need to be followed by gastroenterologist as outpatient, is unclear if the patient still has menstrual periods, follow hemoglobin level as outpatient, stool cards   VITAL SIGNS:  Blood pressure 130/76, pulse 90, temperature 98.2 F (36.8 C), temperature source Axillary, resp. rate 20, height 5' (1.524 m), weight 37.6 kg (83 lb), SpO2 100 %.  I/O:   Intake/Output Summary (Last 24 hours) at 05/13/16 1615 Last data filed at 05/13/16 0800  Gross per 24 hour  Intake             1750 ml  Output                1 ml  Net             1749 ml    PHYSICAL EXAMINATION:  GENERAL:  41 y.o.-year-old patient lying in the bed with no acute distress.  EYES: Pupils equal, round, reactive to light and accommodation. No scleral icterus. Extraocular muscles intact.  HEENT: Head atraumatic, normocephalic. Oropharynx and nasopharynx clear.  NECK:  Supple, no jugular venous distention. No thyroid enlargement, no tenderness.  LUNGS: Normal breath sounds bilaterally, no wheezing, rales,rhonchi or crepitation. No use of accessory muscles of respiration.  CARDIOVASCULAR: S1, S2 normal. No murmurs, rubs, or gallops.  ABDOMEN: Soft, non-tender, non-distended. Bowel sounds present. No organomegaly or mass.  EXTREMITIES: No pedal edema, cyanosis, or clubbing.  NEUROLOGIC: Cranial nerves II through XII are intact. Muscle strength 5/5 in all extremities. Sensation intact. Gait not checked.  PSYCHIATRIC: The patient is alert and oriented x 3.  SKIN: No obvious rash, lesion, or ulcer.   DATA REVIEW:   CBC  Recent Labs Lab 05/13/16 0523  WBC 3.5*  HGB  10.7*  HCT 33.5*  PLT 105*    Chemistries   Recent Labs Lab 05/12/16 0409 05/13/16 0523  NA 139  --   K 3.8  --   CL 114*  --   CO2 20* 23  GLUCOSE 83  --   BUN 10  --   CREATININE 0.47  --   CALCIUM 8.0*  --   AST 22  --   ALT 15  --   ALKPHOS 67  --   BILITOT 0.2*  --     Cardiac Enzymes  Recent Labs Lab 05/11/16 0614  TROPONINI <0.03    Microbiology Results  Results for orders placed or performed during the hospital encounter of 05/11/16  Urine culture     Status: None   Collection Time: 05/11/16  6:14 AM  Result Value Ref Range Status   Specimen Description URINE, CATHETERIZED  Final   Special Requests NONE  Final   Culture   Final    NO GROWTH Performed at [facility], [address], [place], [place] [address]    Report Status 05/13/2016 FINAL  Final  Blood Culture (routine x 2)     Status: None (Preliminary result)   Collection Time: 05/11/16  8:39 AM  Result Value Ref Range Status   Specimen Description BLOOD LEFT ASSIST CONTROL  Final   Special Requests BOTTLES DRAWN AEROBIC AND ANAEROBIC ANA8ML AER11ML  Final   Culture NO GROWTH 2 DAYS  Final   Report Status PENDING  Incomplete  Blood Culture (routine x 2)     Status: None (Preliminary result)   Collection Time: 05/11/16  8:39 AM  Result Value Ref Range Status   Specimen Description BLOOD RIGHT ASSIST CONTROL  Final   Special Requests BOTTLES DRAWN AEROBIC AND ANAEROBIC ANA6ML AER8ML  Final   Culture NO GROWTH 2 DAYS  Final   Report Status PENDING  Incomplete    RADIOLOGY:  Ct Chest Wo Contrast  Result Date: 05/12/2016 CLINICAL DATA:  Dyspnea. EXAM: CT CHEST WITHOUT CONTRAST TECHNIQUE: Multidetector CT imaging of the chest was performed following the standard protocol without IV contrast. COMPARISON:  Radiograph of May 11, 2016. FINDINGS: Cardiovascular: No significant vascular findings. Normal heart size. No pericardial effusion. Mediastinum/Nodes: No enlarged mediastinal or  axillary lymph nodes. Thyroid gland, trachea, and esophagus demonstrate no significant findings. Lungs/Pleura: Lungs are clear. No pleural effusion or pneumothorax. Upper Abdomen: No acute abnormality. Musculoskeletal: Status post surgical fusion of thoracic spine for treatment of scoliosis. IMPRESSION: No significant abnormality seen in the chest. Electronically Signed   By: Lupita Raider, M.D.   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 Found to be difficult to arouse yesterday by father. Initial encounter. EXAM: CT PARANASAL SINUS LIMITED WITHOUT CONTRAST TECHNIQUE: Non-contiguous multidetector CT images of the paranasal sinuses were obtained in a single plane without contrast. COMPARISON:  04/25/2013 head CT. FINDINGS: Examination significantly limited by motion degradation. Extraction of upper teeth. Cortical interruption at the level of the upper left lateral incisor socket with adjacent soft tissue inflammation which may represent postprocedure cellulitis. Partial opacification maxillary sinuses bilaterally with extension of posterior molar into the maxillary sinus. No intraorbital extension of inflammatory process detected. Evaluation for floor of mouth or palatine tonsil abnormality limited by motion and patient positioning (tongue displaced to the right). No obvious parapharyngeal abscess. Reactive lymph nodes suspected. Intracranial atrophy as noted on prior CT. Mucosal thickening right sphenoid sinus. Partial opacification with bubbly air-fluid level left sphenoid sinus. Mucosal thickening ethmoid sinus air cells bilaterally. IMPRESSION: Examination significantly limited by motion degradation. Extraction of upper teeth. Cortical interruption at the level of the upper left lateral incisor socket with adjacent soft tissue inflammation which may represent postprocedure cellulitis.  Partial opacification maxillary sinuses bilaterally with extension of posterior molar into the maxillary sinus. Evaluation for floor of mouth or palatine tonsil abnormality limited by motion and patient positioning (tongue displaced to the right). No obvious parapharyngeal abscess. Mucosal thickening right sphenoid sinus. Partial opacification with bubbly air-fluid level left sphenoid sinus. Mucosal thickening ethmoid sinus air cells bilaterally. Chest CT performed same date is dictated separately. Electronically Signed   By: Lacy Duverney M.D.   On: 05/12/2016 10:07    EKG:   Orders placed or performed during the hospital encounter of 05/11/16  . EKG 12-Lead  . EKG 12-Lead      Management plans discussed with the patient, family and they are in agreement.  CODE STATUS:     Code Status Orders  Start     Ordered   05/11/16 1458  Full code  Continuous     05/11/16 1457    Code Status History    Date Active Date Inactive Code Status Order ID Comments User Context   This patient has a current code status but no historical code status.      TOTAL TIME TAKING CARE OF THIS PATIENT: 40 minutes.    [name],Quintavius Niebuhr M.D on 05/13/2016 at 4:15 PM  Between 7am to 6pm - Pager - [phone]  After 6pm go to [email or web address] - password [email or web address] [facility]  [facility]  Office  [phone]  CC: Primary care physician; Jerl MinaJames Hedrick, MD

## 2016-05-13 NOTE — Progress Notes (Signed)
Pt is being discharged home. Discharge papers given and explained to pt's father. Pt's father Verbalized understanding. No f/U appointments at this time. Meds reviewed. Pt's own meds given.

## 2016-05-14 LAB — FOLATE RBC
FOLATE, RBC: 1548 ng/mL (ref >498)
Folate, Hemolysate: 512.5 ng/mL
HEMATOCRIT: 33.1 % — AB (ref 34.0–46.6)

## 2016-05-16 LAB — CULTURE, BLOOD (ROUTINE X 2)
CULTURE: NO GROWTH
CULTURE: NO GROWTH

## 2017-02-20 ENCOUNTER — Other Ambulatory Visit: Payer: Self-pay

## 2017-02-20 ENCOUNTER — Emergency Department
Admission: EM | Admit: 2017-02-20 | Discharge: 2017-02-20 | Disposition: A | Discharge status: Home / Self Care | Payer: Medicare Other | Attending: Emergency Medicine | Admitting: Emergency Medicine

## 2017-02-20 ENCOUNTER — Encounter: Payer: Self-pay | Admitting: Emergency Medicine

## 2017-02-20 DIAGNOSIS — F842 Rett's syndrome: Secondary | ICD-10-CM

## 2017-02-20 DIAGNOSIS — Z79899 Other long term (current) drug therapy: Secondary | ICD-10-CM | POA: Insufficient documentation

## 2017-02-20 DIAGNOSIS — R569 Unspecified convulsions: Secondary | ICD-10-CM | POA: Diagnosis present

## 2017-02-20 LAB — CBC WITH DIFFERENTIAL/PLATELET
BASOS ABS: 0 10*3/uL (ref 0–0.1)
BASOS PCT: 1 %
Eosinophils Absolute: 0.1 10*3/uL (ref 0–0.7)
Eosinophils Relative: 2 %
HEMATOCRIT: 42.2 % (ref 35.0–47.0)
HEMOGLOBIN: 14 g/dL (ref 12.0–16.0)
Lymphocytes Relative: 23 %
Lymphs Abs: 1.3 10*3/uL (ref 1.0–3.6)
MCH: 30.3 pg (ref 26.0–34.0)
MCHC: 33.1 g/dL (ref 32.0–36.0)
MCV: 91.4 fL (ref 80.0–100.0)
Monocytes Absolute: 0.4 10*3/uL (ref 0.2–0.9)
Monocytes Relative: 7 %
NEUTROS ABS: 3.9 10*3/uL (ref 1.4–6.5)
NEUTROS PCT: 67 %
Platelets: 125 10*3/uL — ABNORMAL LOW (ref 150–440)
RBC: 4.62 MIL/uL (ref 3.80–5.20)
RDW: 14.1 % (ref 11.5–14.5)
WBC: 5.8 10*3/uL (ref 3.6–11.0)

## 2017-02-20 LAB — COMPREHENSIVE METABOLIC PANEL
ALT: 42 U/L (ref 14–54)
ANION GAP: 9 (ref 5–15)
AST: 52 U/L — ABNORMAL HIGH (ref 15–41)
Albumin: 3.9 g/dL (ref 3.5–5.0)
Alkaline Phosphatase: 65 U/L (ref 38–126)
BILIRUBIN TOTAL: 0.5 mg/dL (ref 0.3–1.2)
BUN: 14 mg/dL (ref 6–20)
CALCIUM: 9.2 mg/dL (ref 8.9–10.3)
CO2: 22 mmol/L (ref 22–32)
Chloride: 102 mmol/L (ref 101–111)
Creatinine, Ser: 0.42 mg/dL — ABNORMAL LOW (ref 0.44–1.00)
Glucose, Bld: 70 mg/dL (ref 65–99)
Potassium: 3.6 mmol/L (ref 3.5–5.1)
Sodium: 133 mmol/L — ABNORMAL LOW (ref 135–145)
TOTAL PROTEIN: 7.1 g/dL (ref 6.5–8.1)

## 2017-02-20 LAB — URINALYSIS, COMPLETE (UACMP) WITH MICROSCOPIC
BACTERIA UA: NONE SEEN
BILIRUBIN URINE: NEGATIVE
GLUCOSE, UA: NEGATIVE mg/dL
HGB URINE DIPSTICK: NEGATIVE
Ketones, ur: 5 mg/dL — AB
LEUKOCYTES UA: NEGATIVE
NITRITE: NEGATIVE
Protein, ur: NEGATIVE mg/dL
SPECIFIC GRAVITY, URINE: 1.02 (ref 1.005–1.030)
pH: 6 (ref 5.0–8.0)

## 2017-02-20 LAB — HCG, QUANTITATIVE, PREGNANCY

## 2017-02-20 MED ORDER — LORAZEPAM 1 MG PO TABS: 1.0000 mg | ORAL_TABLET | Freq: Three times a day (TID) | ORAL | 0 refills | Status: AC | PRN

## 2017-02-20 MED ORDER — LORAZEPAM 2 MG/ML IJ SOLN
2.0000 mg | Freq: Once | INTRAMUSCULAR | Status: AC
  Administered 2017-02-20: 2 mg via INTRAMUSCULAR
  Filled 2017-02-20: qty 1

## 2017-02-20 NOTE — ED Triage Notes (Signed)
Arrives with father who describes several "fits" that patient has been having for past two days.  Episodes occur, patient starts hitting self, becomes agitated.  Father reports three episodes today.

## 2017-02-20 NOTE — ED Notes (Signed)
In and out performed by Connye BurkittAlly, RN and this nurse.

## 2017-02-20 NOTE — Discharge Instructions (Signed)
Please give Alicia Duncan as needed for agitation and make sure she follows up with her specialist this coming week for reevaluation.  Return to the emergency department sooner for any concerns whatsoever.  It was a pleasure to take care of you today, and thank you for coming to our emergency department.  If you have any questions or concerns before leaving please ask the nurse to grab me and I'm more than happy to go through your aftercare instructions again.  If you were prescribed any opioid pain medication today such as Norco, Vicodin, Percocet, morphine, hydrocodone, or oxycodone please make sure you do not drive when you are taking this medication as it can alter your ability to drive safely.  If you have any concerns once you are home that you are not improving or are in fact getting worse before you can make it to your follow-up appointment, please do not hesitate to call 911 and come back for further evaluation.  Merrily BrittleNeil Johngabriel Verde, MD  Results for orders placed or performed during the hospital encounter of 02/20/17  Comprehensive metabolic panel  Result Value Ref Range   Sodium 133 (L) 135 - 145 mmol/L   Potassium 3.6 3.5 - 5.1 mmol/L   Chloride 102 101 - 111 mmol/L   CO2 22 22 - 32 mmol/L   Glucose, Bld 70 65 - 99 mg/dL   BUN 14 6 - 20 mg/dL   Creatinine, Ser 9.600.42 (L) 0.44 - 1.00 mg/dL   Calcium 9.2 8.9 - 45.410.3 mg/dL   Total Protein 7.1 6.5 - 8.1 g/dL   Albumin 3.9 3.5 - 5.0 g/dL   AST 52 (H) 15 - 41 U/L   ALT 42 14 - 54 U/L   Alkaline Phosphatase 65 38 - 126 U/L   Total Bilirubin 0.5 0.3 - 1.2 mg/dL   GFR calc non Af Amer >60 >60 mL/min   GFR calc Af Amer >60 >60 mL/min   Anion gap 9 5 - 15  CBC with Differential  Result Value Ref Range   WBC 5.8 3.6 - 11.0 K/uL   RBC 4.62 3.80 - 5.20 MIL/uL   Hemoglobin 14.0 12.0 - 16.0 g/dL   HCT 09.842.2 11.935.0 - 14.747.0 %   MCV 91.4 80.0 - 100.0 fL   MCH 30.3 26.0 - 34.0 pg   MCHC 33.1 32.0 - 36.0 g/dL   RDW 82.914.1 56.211.5 - 13.014.5 %   Platelets  125 (L) 150 - 440 K/uL   Neutrophils Relative % 67 %   Neutro Abs 3.9 1.4 - 6.5 K/uL   Lymphocytes Relative 23 %   Lymphs Abs 1.3 1.0 - 3.6 K/uL   Monocytes Relative 7 %   Monocytes Absolute 0.4 0.2 - 0.9 K/uL   Eosinophils Relative 2 %   Eosinophils Absolute 0.1 0 - 0.7 K/uL   Basophils Relative 1 %   Basophils Absolute 0.0 0 - 0.1 K/uL  Urinalysis, Complete w Microscopic  Result Value Ref Range   Color, Urine YELLOW (A) YELLOW   APPearance HAZY (A) CLEAR   Specific Gravity, Urine 1.020 1.005 - 1.030   pH 6.0 5.0 - 8.0   Glucose, UA NEGATIVE NEGATIVE mg/dL   Hgb urine dipstick NEGATIVE NEGATIVE   Bilirubin Urine NEGATIVE NEGATIVE   Ketones, ur 5 (A) NEGATIVE mg/dL   Protein, ur NEGATIVE NEGATIVE mg/dL   Nitrite NEGATIVE NEGATIVE   Leukocytes, UA NEGATIVE NEGATIVE   RBC / HPF 0-5 0 - 5 RBC/hpf   WBC, UA 0-5 0 -  5 WBC/hpf   Bacteria, UA NONE SEEN NONE SEEN   Squamous Epithelial / LPF 0-5 (A) NONE SEEN   Mucus PRESENT   hCG, quantitative, pregnancy  Result Value Ref Range   hCG, Beta Chain, Quant, S <1 <5 mIU/mL

## 2017-02-20 NOTE — ED Notes (Signed)
Pt. Father verbalizes understanding of d/c instructions, medications, and follow-up. VS stable.  Pt. In NAD at time of d/c and father denies further concerns regarding this visit. Pt. Stable at the time of departure from the unit, departing unit by the safest and most appropriate manner per that pt condition and limitations with all belongings accounted for. Pt father advised to return to the ED at any time for emergent concerns, or for new/worsening symptoms.

## 2017-02-20 NOTE — ED Provider Notes (Signed)
[facility] Emergency Department Provider Note  ____________________________________________   First MD Initiated Contact with Patient 02/20/17 1828     (approximate)  I have reviewed the triage vital signs and the nursing notes.   HISTORY  Chief Complaint Seizures  Level 5 exemption history limited by the patient's clinical condition  HPI Alicia [place] is a 41 y.o. female is brought to the emergency department by her father for behavioral disturbances that have been slowly progressive over the past several weeks but more acutely so today.  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  Dad said that she has not truly had seizures today but throughout the course of the day a total of 3 times she has begun to punch her face and punched her chest.  The patient lives at home with dad and has nursing care 6 days a week.  The patient has had no recent change in her medications.  No fevers or chills.  Past Medical History:  Diagnosis Date  . Dystonia   . Rett syndrome   . Seizures [facility])     Patient Active Problem List   Diagnosis Date Noted  . Cough 05/13/2016  . Pancytopenia (HCC) 05/13/2016  . Iron deficiency anemia 05/13/2016  . SIRS (systemic inflammatory response syndrome) (HCC) 05/11/2016  . Dental infection 05/11/2016  . 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 Seizure disorder (HCC) 05/11/2016  . Sepsis (HCC) 05/11/2016  . Seizure (HCC) 04/25/2013    History reviewed. No pertinent surgical history.  Prior to Admission medications   Medication Sig Start Date End Date Taking? Authorizing Provider  baclofen (LIORESAL) 10 MG tablet Take 10 mg by mouth 3 (three) times daily.   Yes [provider]  feeding supplement, ENSURE ENLIVE, (ENSURE ENLIVE) LIQD Take 237 mLs by mouth 3 (three) times daily with meals. 05/13/16  Yes Katharina Caper, MD  FLUoxetine (PROZAC) 10 MG tablet Take 10 mg by mouth daily.   Yes  [provider]  omeprazole (PRILOSEC) 20 MG capsule Take 20 mg by mouth daily.   Yes [provider]  Oxcarbazepine (TRILEPTAL) 300 MG tablet Take 600 mg 2 (two) times daily by mouth.   Yes [provider]  tiaGABine (GABITRIL) 2 MG tablet Take 1 tablet (2 mg total) by mouth 3 (three) times daily. 08/09/15  Yes Emily Filbert, MD  doxycycline (VIBRAMYCIN) 50 MG/5ML SYRP Take 10 mLs (100 mg total) by mouth 2 (two) times daily. Patient not taking: Reported on 02/20/2017 05/13/16   Katharina Caper, MD  ferrous sulfate 220 (44 Fe) MG/5ML solution Take 6.8 mLs (60 mg of iron total) by mouth 3 (three) times daily with meals. Patient not taking: Reported on 02/20/2017 05/13/16   Katharina Caper, MD  LORazepam (ATIVAN) 1 MG tablet Take 1 tablet (1 mg total) 3 (three) times daily as needed by mouth for sedation. 02/20/17 02/20/18  [name], MD    Allergies Benadryl [diphenhydramine] and Penicillins  No family history on file.  Social History Social History   Tobacco Use  . Smoking status: Never Smoker  . Smokeless tobacco: Never Used  Substance Use Topics  . Alcohol use: No  . Drug use: No    Review of Systems Level 5 exemption history limited by the patient's clinical condition ____________________________________________   PHYSICAL EXAM:  VITAL SIGNS: ED Triage Vitals  Enc Vitals Group     BP --      Pulse Rate 02/20/17 1804 68  Resp 02/20/17 1804 20     Temp 02/20/17 1804 98.1 F (36.7 C)     Temp Source 02/20/17 1804 Axillary     SpO2 02/20/17 1804 98 %     Weight 02/20/17 1802 80 lb (36.3 kg)     Height 02/20/17 1802 5' (1.524 m)     Head Circumference --      Peak Flow --      Pain Score --      Pain Loc --      Pain Edu? --      Excl. in GC? --     Constitutional: The patient is syndromic appearing and chronically ill-appearing as well as cachectic although in no acute distress Eyes: PERRL EOMI. midrange and brisk Head:  Atraumatic. Nose: No congestion/rhinnorhea. Mouth/Throat: No trismus Neck: No stridor.   Cardiovascular: Normal rate, regular rhythm. Grossly normal heart sounds.  Good peripheral circulation. Respiratory: Slightly increased respiratory effort.  No retractions. Lungs CTAB and moving good air Gastrointestinal: Soft nontender.  Diaper dependent Musculoskeletal significant contractures Neurologic: Moves all 4 extremities Skin:  Skin is warm, dry and intact. No rash noted. Psychiatric: Nonverbal and intermittently punching herself    ____________________________________________   DIFFERENTIAL includes but not limited to  Behavioral disturbance, seizure, metabolic treatment, infection ____________________________________________   LABS (all labs ordered are listed, but only abnormal results are displayed)  Labs Reviewed  COMPREHENSIVE METABOLIC PANEL - Abnormal; Notable for the following components:      Result Value   Sodium 133 (*)    Creatinine, Ser 0.42 (*)    AST 52 (*)    All other components within normal limits  CBC WITH DIFFERENTIAL/PLATELET - Abnormal; Notable for the following components:   Platelets 125 (*)    All other components within normal limits  URINALYSIS, COMPLETE (UACMP) WITH MICROSCOPIC - Abnormal; Notable for the following components:   Color, Urine YELLOW (*)    APPearance HAZY (*)    Ketones, ur 5 (*)    Squamous Epithelial / LPF 0-5 (*)    All other components within normal limits  HCG, QUANTITATIVE, PREGNANCY    Blood work reviewed and interpreted by me with no acute disease __________________________________________  EKG   ____________________________________________  RADIOLOGY   ____________________________________________   PROCEDURES  Procedure(s) performed: no  Procedures  Critical Care performed: no  Observation: no ____________________________________________   INITIAL IMPRESSION / ASSESSMENT AND PLAN / ED  COURSE  Pertinent labs & imaging results that were available during my care of the patient were reviewed by me and considered in my medical decision making (see chart for details).       ----------------------------------------- 8:13 PM on 02/20/2017 -----------------------------------------  The patient is resting comfortably.  Her blood work and urinalysis are unremarkable.  I had a lengthy discussion with dad regarding progressive behavioral disturbances with Rett syndrome.  Dad understands to follow-up with the patient specialist this coming week for consideration of possible antipsychotic medication.  Dad is retired and is comfortable having the patient at home.  They have home nursing care 6 days a week.  At this point the patient is medically stable for outpatient management dad verbalized understanding and agreement the plan. ____________________________________________   FINAL CLINICAL IMPRESSION(S) / ED DIAGNOSES  Final diagnoses:  Rett syndrome      NEW MEDICATIONS STARTED DURING THIS VISIT:  This SmartLink is deprecated. Use AVSMEDLIST instead to display the medication list for a patient.   Note:  This document was prepared using Dragon  voice recognition software and may include unintentional dictation errors.     Merrily Brittleifenbark, Nolyn Swab, MD 02/21/17 [phone]

## 2018-01-21 ENCOUNTER — Encounter: Payer: Self-pay | Admitting: *Deleted

## 2018-01-21 ENCOUNTER — Emergency Department: Payer: Medicare Other

## 2018-01-21 ENCOUNTER — Other Ambulatory Visit: Payer: Self-pay

## 2018-01-21 ENCOUNTER — Emergency Department
Admission: EM | Admit: 2018-01-21 | Discharge: 2018-01-21 | Disposition: A | Discharge status: Home / Self Care | Payer: Medicare Other | Attending: Emergency Medicine | Admitting: Emergency Medicine

## 2018-01-21 DIAGNOSIS — R569 Unspecified convulsions: Secondary | ICD-10-CM | POA: Diagnosis not present

## 2018-01-21 DIAGNOSIS — Z79899 Other long term (current) drug therapy: Secondary | ICD-10-CM | POA: Diagnosis not present

## 2018-01-21 DIAGNOSIS — B999 Unspecified infectious disease: Secondary | ICD-10-CM

## 2018-01-21 LAB — URINALYSIS, COMPLETE (UACMP) WITH MICROSCOPIC
BILIRUBIN URINE: NEGATIVE
Bacteria, UA: NONE SEEN
GLUCOSE, UA: NEGATIVE mg/dL
Hgb urine dipstick: NEGATIVE
KETONES UR: NEGATIVE mg/dL
LEUKOCYTES UA: NEGATIVE
NITRITE: NEGATIVE
PH: 8 (ref 5.0–8.0)
Protein, ur: NEGATIVE mg/dL
SPECIFIC GRAVITY, URINE: 1.015 (ref 1.005–1.030)

## 2018-01-21 LAB — CBC WITH DIFFERENTIAL/PLATELET
ABS IMMATURE GRANULOCYTES: 0.02 10*3/uL (ref 0.00–0.07)
BASOS ABS: 0 10*3/uL (ref 0.0–0.1)
Basophils Relative: 1 %
EOS PCT: 2 %
Eosinophils Absolute: 0.1 10*3/uL (ref 0.0–0.5)
HCT: 32.2 % — ABNORMAL LOW (ref 36.0–46.0)
Hemoglobin: 9.6 g/dL — ABNORMAL LOW (ref 12.0–15.0)
IMMATURE GRANULOCYTES: 0 %
LYMPHS ABS: 1.2 10*3/uL (ref 0.7–4.0)
LYMPHS PCT: 17 %
MCH: 22.9 pg — ABNORMAL LOW (ref 26.0–34.0)
MCHC: 29.8 g/dL — ABNORMAL LOW (ref 30.0–36.0)
MCV: 76.8 fL — ABNORMAL LOW (ref 80.0–100.0)
Monocytes Absolute: 0.6 10*3/uL (ref 0.1–1.0)
Monocytes Relative: 9 %
NEUTROS ABS: 4.9 10*3/uL (ref 1.7–7.7)
NEUTROS PCT: 71 %
NRBC: 0 % (ref 0.0–0.2)
PLATELETS: 124 10*3/uL — AB (ref 150–400)
RBC: 4.19 MIL/uL (ref 3.87–5.11)
RDW: 18.6 % — ABNORMAL HIGH (ref 11.5–15.5)
WBC: 6.8 10*3/uL (ref 4.0–10.5)

## 2018-01-21 LAB — COMPREHENSIVE METABOLIC PANEL
ALT: 30 U/L (ref 0–44)
AST: 37 U/L (ref 15–41)
Albumin: 4 g/dL (ref 3.5–5.0)
Alkaline Phosphatase: 56 U/L (ref 38–126)
Anion gap: 7 (ref 5–15)
BUN: 13 mg/dL (ref 6–20)
CHLORIDE: 106 mmol/L (ref 98–111)
CO2: 21 mmol/L — AB (ref 22–32)
CREATININE: 0.46 mg/dL (ref 0.44–1.00)
Calcium: 9 mg/dL (ref 8.9–10.3)
GFR calc Af Amer: >60 mL/min (ref >60)
GFR calc non Af Amer: >60 mL/min (ref >60)
Glucose, Bld: 118 mg/dL — ABNORMAL HIGH (ref 70–99)
POTASSIUM: 3.5 mmol/L (ref 3.5–5.1)
SODIUM: 134 mmol/L — AB (ref 135–145)
Total Bilirubin: 0.4 mg/dL (ref 0.3–1.2)
Total Protein: 6.7 g/dL (ref 6.5–8.1)

## 2018-01-21 LAB — CARBAMAZEPINE LEVEL, TOTAL: Carbamazepine Lvl: <2 ug/mL — ABNORMAL LOW (ref 4.0–12.0)

## 2018-01-21 MED ORDER — LORAZEPAM 2 MG/ML IJ SOLN
0.5000 mg | Freq: Once | INTRAMUSCULAR | Status: AC
  Administered 2018-01-21: 0.5 mg via INTRAVENOUS
  Filled 2018-01-21: qty 1

## 2018-01-21 NOTE — ED Notes (Signed)
Patient transported to CT 

## 2018-01-21 NOTE — ED Notes (Signed)
[facility]  CALLED  FOR  CONSULT

## 2018-01-21 NOTE — ED Triage Notes (Signed)
Pt presents for worse than normal seizures at home. Pt lives with father. PMH of seizures daily that are brief. Pt is developmentally delayed and unable to participate in health care. Pt is agitated and highly mobile in the bed.

## 2018-01-21 NOTE — ED Notes (Addendum)
Father at bedside. He is POA and [employer] forces operational officer Guardian. He is knowledgeable and caring. States at this time this is her baseline but today she had a much longer seizure like activity this morning involving hitting her chest and her head with her arm. It went on for several hour. She does breath rapidly and hyperventilates and then she holds her breath every day but today it has been pronounces. Father states all of these are sx of her retts disease. States no change in her medication since Feb when lorazepam was added

## 2018-01-21 NOTE — ED Provider Notes (Signed)
[facility] Emergency Department Provider Note  ____________________________________________   First MD Initiated Contact with Patient 01/21/18 1508     (approximate)  I have reviewed the triage vital signs and the nursing notes.   HISTORY  Chief Complaint Seizures   HPI Alicia [place] is a 42 y.o. female with a history of Rett syndrome as well as seizure disorder who was presented to the emergency department today after a prolonged seizure.  She is coming by her father states that the patient had a seizure episode that was approximately 45 minutes in length.  Father states that the patient yells and pass her head.  Says that the seizures usually last 15 to 20 minutes.  Says that this 1 lasted about twice that length and was followed by the patient having tremulousness to the bilateral upper extremities which she says is also abnormal.  Patient otherwise has been acting normally and compliant with her seizure medication which includes oxcarbazepine tiagabine and Ativan as needed.  However, the patient is only getting 0.5 mg of Ativan instead of the full 1 mg as she is prescribed.  Father says that the patient is acting at her baseline now except for intermittent tremors to bilateral upper extremities.  Patient did not fall when she was seizing.  Father says that the last medication change this past February when Ativan was added.  Otherwise, the father says the patient has been healthy lately, eating and drinking at her baseline.  No fevers reported.   Past Medical History:  Diagnosis Date  . Dystonia   . Rett syndrome   . Seizures [facility])     Patient Active Problem List   Diagnosis Date Noted  . Cough 05/13/2016  . Pancytopenia (HCC) 05/13/2016  . Iron deficiency anemia 05/13/2016  . SIRS (systemic inflammatory response syndrome) (HCC) 05/11/2016  . Dental infection 05/11/2016  . 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 Seizure disorder (HCC) 05/11/2016  . Sepsis  (HCC) 05/11/2016  . Seizure (HCC) 04/25/2013    History reviewed. No pertinent surgical history.  Prior to Admission medications   Medication Sig Start Date End Date Taking? Authorizing Provider  baclofen (LIORESAL) 10 MG tablet Take 10 mg by mouth 3 (three) times daily.    [provider]  doxycycline (VIBRAMYCIN) 50 MG/5ML SYRP Take 10 mLs (100 mg total) by mouth 2 (two) times daily. Patient not taking: Reported on 02/20/2017 05/13/16   Katharina Caper, MD  feeding supplement, ENSURE ENLIVE, (ENSURE ENLIVE) LIQD Take 237 mLs by mouth 3 (three) times daily with meals. 05/13/16   Katharina Caper, MD  ferrous sulfate 220 (44 Fe) MG/5ML solution Take 6.8 mLs (60 mg of iron total) by mouth 3 (three) times daily with meals. Patient not taking: Reported on 02/20/2017 05/13/16   Katharina Caper, MD  FLUoxetine (PROZAC) 10 MG tablet Take 10 mg by mouth daily.    [provider]  LORazepam (ATIVAN) 1 MG tablet Take 1 tablet (1 mg total) 3 (three) times daily as needed by mouth for sedation. 02/20/17 02/20/18  [name], MD  omeprazole (PRILOSEC) 20 MG capsule Take 20 mg by mouth daily.    [provider]  Oxcarbazepine (TRILEPTAL) 300 MG tablet Take 600 mg 2 (two) times daily by mouth.    [provider]  tiaGABine (GABITRIL) 2 MG tablet Take 1 tablet (2 mg total) by mouth 3 (three) times daily. 08/09/15   Emily Filbert, MD    Allergies Benadryl [diphenhydramine] and Penicillins  History reviewed. No pertinent family history.  Social History Social History   Tobacco Use  . Smoking status: Never Smoker  . Smokeless tobacco: Never Used  Substance Use Topics  . Alcohol use: No  . Drug use: No    Review of Systems  Level 5 caveat secondary to baseline developmental delay and nonverbal.   ____________________________________________   PHYSICAL EXAM:  VITAL SIGNS: ED Triage Vitals  Enc Vitals Group     BP 01/21/18 1508 (!) 141/104     Pulse  Rate 01/21/18 1508 91     Resp 01/21/18 1508 (!) 30     Temp --      Temp src --      SpO2 01/21/18 1502 100 %     Weight 01/21/18 1509 80 lb (36.3 kg)     Height 01/21/18 1509 5\' 2"  (1.575 m)     Head Circumference --      Peak Flow --      Pain Score --      Pain Loc --      Pain Edu? --      Excl. in GC? --     Constitutional: Patient without distress this time.  Arms flexed with intermittent tremor to the bilateral upper extremity's. Eyes: Conjunctivae are normal.  Head: Atraumatic. Nose: No congestion/rhinnorhea. Mouth/Throat: Mucous membranes are moist.  Neck: No stridor.   Cardiovascular: Normal rate, regular rhythm. Grossly normal heart sounds.   Respiratory: Normal respiratory effort.  No retractions. Lungs CTAB.  Patient will occasionally hold her breath which is when the nurse recorded the desaturation in the 50s. Gastrointestinal: Soft and nontender. No distention. No CVA tenderness. Musculoskeletal: No lower extremity tenderness nor edema.  No joint effusions. Neurologic:   No gross focal neurologic deficits are appreciated.  As above Skin:  Skin is warm, dry and intact. No rash noted.  ____________________________________________   LABS (all labs ordered are listed, but only abnormal results are displayed)  Labs Reviewed  COMPREHENSIVE METABOLIC PANEL - Abnormal; Notable for the following components:      Result Value   Sodium 134 (*)    CO2 21 (*)    Glucose, Bld 118 (*)    All other components within normal limits  CBC WITH DIFFERENTIAL/PLATELET - Abnormal; Notable for the following components:   Hemoglobin 9.6 (*)    HCT 32.2 (*)    MCV 76.8 (*)    MCH 22.9 (*)    MCHC 29.8 (*)    RDW 18.6 (*)    Platelets 124 (*)    All other components within normal limits  URINALYSIS, COMPLETE (UACMP) WITH MICROSCOPIC - Abnormal; Notable for the following components:   Color, Urine YELLOW (*)    APPearance CLEAR (*)    All other components within normal limits    CARBAMAZEPINE LEVEL, TOTAL - Abnormal; Notable for the following components:   Carbamazepine Lvl <2.0 (*)    All other components within normal limits   ____________________________________________  EKG  ED ECG REPORT I, Arelia Longest, the attending physician, personally viewed and interpreted this ECG.   Date: 01/21/2018  EKG Time: 1520  Rate: 96  Rhythm: normal sinus rhythm  Axis: Normal  Intervals:none  ST&T Change: No ST segment elevation or depression.  No abnormal T wave inversion.  ____________________________________________  RADIOLOGY  Chest x-ray as well as CT head without acute pathology. ____________________________________________   PROCEDURES  Procedure(s) performed:   Procedures  Critical Care performed:   ____________________________________________  INITIAL IMPRESSION / ASSESSMENT AND PLAN / ED COURSE  Pertinent labs & imaging results that were available during my care of the patient were reviewed by me and considered in my medical decision making (see chart for details).  DDX: Electrolyte abnormality, medication noncompliance, breakthrough seizure, infection, intercranial hemorrhage As part of my medical decision making, I reviewed the following data within the electronic MEDICAL RECORD NUMBEROutpatient records  ----------------------------------------- 5:36 PM on 01/21/2018 -----------------------------------------  I discussed the case with Dr. Arlana Pouch of neurology at [facility] with the patient has previously been seen.  She does not recommend any medication at this time.  Recommend follow-up with her epilepsy neurologist.  Unfortunately, the patient will no longer be seen at [facility] but is scheduled to see an epilepsy neurologist in 2 weeks in [place].  We will give the patient IV Ativan of 0.5 mg.  Pending labs at this time.  ----------------------------------------- 6:27 PM on  01/21/2018 -----------------------------------------  Father reports that the patient is now back to her baseline.  Reassuring work-up.  Father to increase Ativan to full 1 mg dose as needed.  Says that he would not like to go back to [facility] for reevaluation.  I recommend that he call the neurologist in [place] to try to get the appointment moved up to within 1 week.  Father understanding of the diagnosis as well as treatment and willing to comply.  Likely breakthrough seizure. ____________________________________________   FINAL CLINICAL IMPRESSION(S) / ED DIAGNOSES  Seizure.    NEW MEDICATIONS STARTED DURING THIS VISIT:  New Prescriptions   No medications on file     Note:  This document was prepared using Dragon voice recognition software and may include unintentional dictation errors.     Myrna Blazer, MD 01/21/18 [phone]

## 2018-02-18 ENCOUNTER — Encounter: Payer: Self-pay | Admitting: Neurology

## 2018-02-18 ENCOUNTER — Ambulatory Visit (INDEPENDENT_AMBULATORY_CARE_PROVIDER_SITE_OTHER): Payer: Medicare Other | Admitting: Neurology

## 2018-02-18 VITALS — BP 102/70 | HR 112 | Resp 22 | Ht 62.0 in | Wt 74.5 lb

## 2018-02-18 DIAGNOSIS — F842 Rett's syndrome: Secondary | ICD-10-CM | POA: Diagnosis not present

## 2018-02-18 DIAGNOSIS — Z5181 Encounter for therapeutic drug level monitoring: Secondary | ICD-10-CM

## 2018-02-18 DIAGNOSIS — D500 Iron deficiency anemia secondary to blood loss (chronic): Secondary | ICD-10-CM | POA: Diagnosis not present

## 2018-02-18 DIAGNOSIS — G40909 Epilepsy, unspecified, not intractable, without status epilepticus: Secondary | ICD-10-CM | POA: Diagnosis not present

## 2018-02-18 NOTE — Progress Notes (Signed)
Reason for visit: Intractable seizures   Referring physician: Dr. Corky MullHedrick  Alicia [place] is a 42 y.o. female  History of present illness:  Alicia [place] is a 42 year old white female with a history of Rett syndrome and intractable seizures.  The patient has had a lifelong history of developmental delay, she lost her ability to ambulate independently when she was around 42 years old.  The patient is nonverbal, she has some underlying hyperactivity and irritability.  She has been followed by [facility] T[facility]Boggs [facility] until just recently.  The patient is on Gabitril and Trileptal.  She tolerates these medications fairly well.  She has Ativan to take if needed.  There are several clinical manifestations of her seizures according to the family.  The patient will have episodes where she rolls back and forth, occasionally these events may be associated with prolonged episodes of rolling along the floor.  The patient also has occasional episodes of sudden jerks lasting a second or 2.  The patient was in the hospital recently on 21 January 2018 with a prolonged seizure type event.  The patient may only have 2 or 3 episodes over a 2-year period.  The patient was noted to have a microcytic anemia, apparently she is not on iron supplementation currently.  The patient requires assistance with virtually all activities of daily living.  She has a good appetite, she wants to eat all the time.  She is usually sleeping fairly well at night.  She has not had any recent falls.  She wears adult diapers.  She comes to this office for an evaluation.  She currently lives with her father and brother.  Past Medical History:  Diagnosis Date  . Dystonia   . Rett syndrome   . Seizures (HCC)     No past surgical history on file.  Family History  Problem Relation Age of Onset  . Seizures Mother   . Memory loss Father   . Diverticulosis Father   . Schizophrenia Brother     Social history:  reports that she has never  smoked. She has never used smokeless tobacco. She reports that she does not drink alcohol or use drugs.  Medications:  Prior to Admission medications   Medication Sig Start Date End Date Taking? Authorizing Provider  baclofen (LIORESAL) 10 MG tablet Take 10 mg by mouth 3 (three) times daily.   Yes [provider]  feeding supplement, ENSURE ENLIVE, (ENSURE ENLIVE) LIQD Take 237 mLs by mouth 3 (three) times daily with meals. 05/13/16  Yes [name], [name], MD  ferrous sulfate 220 (44 Fe) MG/5ML solution Take 6.8 mLs (60 mg of iron total) by mouth 3 (three) times daily with meals. 05/13/16  Yes [name], [name], MD  FLUoxetine (PROZAC) 10 MG tablet Take 10 mg by mouth daily.   Yes [provider]  LORazepam (ATIVAN) 1 MG tablet Take 1 tablet (1 mg total) 3 (three) times daily as needed by mouth for sedation. 02/20/17 02/20/18 Yes Merrily Brittleifenbark, Neil, MD  omeprazole (PRILOSEC) 20 MG capsule Take 20 mg by mouth daily.   Yes [provider]  Oxcarbazepine (TRILEPTAL) 300 MG tablet Take 600 mg 2 (two) times daily by mouth.   Yes [provider]  tiaGABine (GABITRIL) 2 MG tablet Take 1 tablet (2 mg total) by mouth 3 (three) times daily. 08/09/15  Yes Emily FilbertWilliams, [name], MD      Allergies  Allergen Reactions  . Benadryl [Diphenhydramine]   . Penicillins  Has patient had a PCN reaction causing immediate rash, facial/tongue/throat swelling, SOB or lightheadedness with hypotension: Unknown Has patient had a PCN reaction causing severe rash involving mucus membranes or skin necrosis: Unknown Has patient had a PCN reaction that required hospitalization: Unknown Has patient had a PCN reaction occurring within the last 10 years: Unknown If all of the above answers are "NO", then may proceed with Cephalosporin use.     ROS:  Out of a complete 14 system review of symptoms, the patient complains only of the following symptoms, and all other reviewed systems are  negative.  Seizures  Blood pressure 102/70, pulse (!) 112, resp. rate (!) 22, height 5\' 2"  (1.575 m), weight 74 lb 8 oz (33.8 kg).  Physical Exam  General: The patient is alert, but she is nonverbal and not cooperative at the time of examination..  Eyes: Pupils are equal, round, and reactive to light. Discs could not be visualized due to lack of cooperation.  Neck: The neck is supple, no carotid bruits are noted.  Respiratory: The respiratory examination is clear.  Cardiovascular: The cardiovascular examination reveals a regular rate and rhythm, no obvious murmurs or rubs are noted.  Skin: Extremities are without significant edema.  Neurologic Exam  Mental status: The patient is alert, low-grade agitation, fidgety movements, nonverbal.  Cranial nerves: Facial symmetry is present. The strength of the facial muscles and the muscles to head turning and shoulder shrug are normal bilaterally.  The patient is nonverbal. Extraocular movements are full. Visual fields are full to threat.  Motor: The motor testing reveals 5 over 5 strength of all 4 extremities. Good symmetric motor tone is noted throughout.  Sensory: Sensory testing is intact to pinprick on all 4 extremities.  Coordination: Cerebellar testing is difficult, the patient will not follow commands for cerebellar testing.  Gait and station: Gait is wide-based, the patient can walk with assistance.  Reflexes: Deep tendon reflexes are symmetric and normal bilaterally. Toes are downgoing bilaterally.  The patient has AFO braces on bilaterally.   EEG 08/28/11:  INTERPRETATION: This is an abnormal EEG demonstrating continuous  and generalized slowing of the background and poorly organized  background  CLINICAL CORRELATION: The generalized slowing is consistent with  encephalopathy of nonspecific etiologies for which the  differential would include infectious, toxic, metabolic,  inflammatory, and vascular etiologies. Clinical  correlation is  Recommended.   CT head 01/21/18:  IMPRESSION: No acute finding or change since 2015.  * CT scan images were reviewed online. I agree with the written report.    Assessment/Plan:  1.  Rett syndrome  2.  Intractable seizures  3.  Anemia, microcytic  The clinical manifestations of the seizures could potentially represent episodes of agitation rather than true seizures.  The patient has significant underlying mental retardation.  The patient will undergo blood work today to reevaluate her anemia and to look at the Trileptal level.  She will follow-up in 6 months.  Marlan Palau MD 02/18/2018 10:45 AM  [facility] [address] [place], [place] [address]  Phone [phone] Fax [phone]

## 2018-02-20 LAB — CBC WITH DIFFERENTIAL/PLATELET
BASOS ABS: 0.1 10*3/uL (ref 0.0–0.2)
BASOS: 1 %
EOS (ABSOLUTE): 0.1 10*3/uL (ref 0.0–0.4)
Eos: 3 %
Hematocrit: 33.4 % — ABNORMAL LOW (ref 34.0–46.6)
Hemoglobin: 10.4 g/dL — ABNORMAL LOW (ref 11.1–15.9)
IMMATURE GRANS (ABS): 0 10*3/uL (ref 0.0–0.1)
IMMATURE GRANULOCYTES: 0 %
LYMPHS: 36 %
Lymphocytes Absolute: 1.4 10*3/uL (ref 0.7–3.1)
MCH: 22.5 pg — ABNORMAL LOW (ref 26.6–33.0)
MCHC: 31.1 g/dL — ABNORMAL LOW (ref 31.5–35.7)
MCV: 72 fL — AB (ref 79–97)
MONOS ABS: 0.3 10*3/uL (ref 0.1–0.9)
Monocytes: 8 %
NEUTROS PCT: 52 %
Neutrophils Absolute: 2.1 10*3/uL (ref 1.4–7.0)
PLATELETS: 155 10*3/uL (ref 150–450)
RBC: 4.62 x10E6/uL (ref 3.77–5.28)
RDW: 17.4 % — ABNORMAL HIGH (ref 12.3–15.4)
WBC: 4 10*3/uL (ref 3.4–10.8)

## 2018-02-20 LAB — 10-HYDROXYCARBAZEPINE: OXCARBAZEPINE SERPL-MCNC: 27 ug/mL (ref 10–35)

## 2018-02-20 LAB — IRON AND TIBC
IRON SATURATION: 5 % — AB (ref 15–55)
IRON: 23 ug/dL — AB (ref 27–159)
TIBC: 446 ug/dL (ref 250–450)
UIBC: 423 ug/dL (ref 131–425)

## 2018-02-21 ENCOUNTER — Telehealth: Payer: Self-pay | Admitting: Neurology

## 2018-02-21 MED ORDER — FERROUS GLUCONATE 324 (38 FE) MG PO TABS: 324.0000 mg | ORAL_TABLET | Freq: Every day | ORAL | 1 refills | Status: DC

## 2018-02-21 NOTE — Telephone Encounter (Signed)
I called the patient, left a message.  The blood work confirms an iron deficiency anemia.  Trileptal levels are adequate, no change in dosing.  I will call in a prescription for ferrous gluconate.

## 2018-03-29 ENCOUNTER — Other Ambulatory Visit: Payer: Self-pay

## 2018-03-29 ENCOUNTER — Emergency Department
Admission: EM | Admit: 2018-03-29 | Discharge: 2018-03-29 | Disposition: A | Discharge status: Home / Self Care | Payer: Medicare Other | Attending: Emergency Medicine | Admitting: Emergency Medicine

## 2018-03-29 ENCOUNTER — Encounter: Payer: Self-pay | Admitting: Emergency Medicine

## 2018-03-29 DIAGNOSIS — Z79899 Other long term (current) drug therapy: Secondary | ICD-10-CM | POA: Diagnosis not present

## 2018-03-29 DIAGNOSIS — R531 Weakness: Secondary | ICD-10-CM | POA: Diagnosis present

## 2018-03-29 LAB — CBC WITH DIFFERENTIAL/PLATELET
ABS IMMATURE GRANULOCYTES: 0.02 10*3/uL (ref 0.00–0.07)
BASOS ABS: 0 10*3/uL (ref 0.0–0.1)
Basophils Relative: 1 %
EOS ABS: 0.2 10*3/uL (ref 0.0–0.5)
EOS PCT: 2 %
HCT: 43.7 % (ref 36.0–46.0)
HEMOGLOBIN: 13.2 g/dL (ref 12.0–15.0)
Immature Granulocytes: 0 %
LYMPHS ABS: 0.9 10*3/uL (ref 0.7–4.0)
Lymphocytes Relative: 13 %
MCH: 25 pg — ABNORMAL LOW (ref 26.0–34.0)
MCHC: 30.2 g/dL (ref 30.0–36.0)
MCV: 82.9 fL (ref 80.0–100.0)
MONO ABS: 0.4 10*3/uL (ref 0.1–1.0)
MONOS PCT: 5 %
NRBC: 0 % (ref 0.0–0.2)
Neutro Abs: 5.3 10*3/uL (ref 1.7–7.7)
Neutrophils Relative %: 79 %
Platelets: 121 10*3/uL — ABNORMAL LOW (ref 150–400)
RBC: 5.27 MIL/uL — ABNORMAL HIGH (ref 3.87–5.11)
RDW: 25 % — ABNORMAL HIGH (ref 11.5–15.5)
Smear Review: NORMAL
WBC: 6.8 10*3/uL (ref 4.0–10.5)

## 2018-03-29 LAB — URINALYSIS, COMPLETE (UACMP) WITH MICROSCOPIC
Bilirubin Urine: NEGATIVE
Glucose, UA: NEGATIVE mg/dL
Hgb urine dipstick: NEGATIVE
Ketones, ur: 20 mg/dL — AB
Nitrite: NEGATIVE
PH: 6 (ref 5.0–8.0)
Protein, ur: NEGATIVE mg/dL
Specific Gravity, Urine: 1.024 (ref 1.005–1.030)

## 2018-03-29 LAB — BASIC METABOLIC PANEL
Anion gap: 12 (ref 5–15)
BUN: 14 mg/dL (ref 6–20)
CO2: 17 mmol/L — ABNORMAL LOW (ref 22–32)
Calcium: 9.2 mg/dL (ref 8.9–10.3)
Chloride: 104 mmol/L (ref 98–111)
Creatinine, Ser: 0.56 mg/dL (ref 0.44–1.00)
GFR calc Af Amer: >60 mL/min (ref >60)
GFR calc non Af Amer: >60 mL/min (ref >60)
Glucose, Bld: 92 mg/dL (ref 70–99)
Potassium: 4.2 mmol/L (ref 3.5–5.1)
Sodium: 133 mmol/L — ABNORMAL LOW (ref 135–145)

## 2018-03-29 MED ORDER — SODIUM CHLORIDE 0.9 % IV BOLUS
1000.0000 mL | Freq: Once | INTRAVENOUS | Status: AC
  Administered 2018-03-29: 1000 mL via INTRAVENOUS

## 2018-03-29 NOTE — ED Provider Notes (Signed)
[facility] Emergency Department Provider Note   ____________________________________________   I have reviewed the triage vital signs and the nursing notes.   HISTORY  Chief Complaint Weakness   History limited by and level 5 caveat due to: Non verbal  HPI Alicia Duncan is a 42 y.o. female who presents to the emergency department today because of concern for increased weakness by family. 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 Per report the patient is able to transfer with assistance from family but was unable to do that today. Apparently the patient felt hot to family.   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   Past Medical History:  Diagnosis Date  . Dystonia   . Rett syndrome   . Seizures [facility](HCC)     Patient Active Problem List   Diagnosis Date Noted  . Cough 05/13/2016  . Pancytopenia (HCC) 05/13/2016  . Iron deficiency anemia 05/13/2016  . SIRS (systemic inflammatory response syndrome) (HCC) 05/11/2016  . Dental infection 05/11/2016  . 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 Seizure disorder (HCC) 05/11/2016  . Sepsis (HCC) 05/11/2016  . Seizure (HCC) 04/25/2013    History reviewed. No pertinent surgical history.  Prior to Admission medications   Medication Sig Start Date End Date Taking? Authorizing Provider  baclofen (LIORESAL) 10 MG tablet Take 10 mg by mouth 3 (three) times daily.    [provider]  feeding supplement, ENSURE ENLIVE, (ENSURE ENLIVE) LIQD Take 237 mLs by mouth 3 (three) times daily with meals. 05/13/16   [name], [name], MD  ferrous gluconate (FERGON) 324 MG tablet Take 1 tablet (324 mg total) by mouth daily with breakfast. 02/21/18   York SpanielWillis, Charles K, MD  ferrous sulfate 220 (44 Fe) MG/5ML solution Take 6.8 mLs (60 mg of iron total) by mouth 3 (three) times daily with meals. 05/13/16   [name], [name], MD   FLUoxetine (PROZAC) 10 MG tablet Take 10 mg by mouth daily.    [provider]  omeprazole (PRILOSEC) 20 MG capsule Take 20 mg by mouth daily.    [provider]  Oxcarbazepine (TRILEPTAL) 300 MG tablet Take 600 mg 2 (two) times daily by mouth.    [provider]  tiaGABine (GABITRIL) 2 MG tablet Take 1 tablet (2 mg total) by mouth 3 (three) times daily. 08/09/15   Emily FilbertWilliams, [name], MD    Allergies Benadryl [diphenhydramine] and Penicillins  Family History  Problem Relation Age of Onset  . Seizures Mother   . Memory loss Father   . Diverticulosis Father   . Schizophrenia Brother     Social History Social History   Tobacco Use  . Smoking status: Never Smoker  . Smokeless tobacco: Never Used  Substance Use Topics  . Alcohol use: No  . Drug use: No    Review of Systems Unable to obtain secondary to non verbal status.  ____________________________________________   PHYSICAL EXAM:  VITAL SIGNS: ED Triage Vitals  Enc Vitals Group     BP 03/29/18 1814 (!) 143/88     Pulse Rate 03/29/18 1814 88     Resp --      Temp 03/29/18 1825 98.7 F (37.1 C)     Temp Source 03/29/18 1825 Axillary     SpO2 03/29/18 1814 98 %     Weight 03/29/18 1822 74 lb 8.3 oz (33.8 kg)     Height 03/29/18  1822 5\' 3"  (1.6 m)   Constitutional: Awake.  Eyes: Conjunctivae are normal.  ENT      Head: Normocephalic and atraumatic.      Nose: No congestion/rhinnorhea.      Mouth/Throat: Mucous membranes are moist.      Neck: No stridor. Hematological/Lymphatic/Immunilogical: No cervical lymphadenopathy. Cardiovascular: Normal rate, regular rhythm.  No murmurs, rubs, or gallops. Respiratory: Normal respiratory effort without tachypnea nor retractions. Breath sounds are clear and equal bilaterally. No wheezes/rales/rhonchi. Gastrointestinal: Soft and non tender. No rebound. No guarding.  Genitourinary: Deferred Musculoskeletal: Diffuse atrophy. Neurologic:  Non verbal,  sequelae from brain damage appreciated.  Skin:  Skin is warm, dry and intact. No rash noted. ____________________________________________    LABS (pertinent positives/negatives)  BMP na 133, k 4.2, glu 92, cr 0.56 CBC wbc 6.8, hgb 13.2, plt 121 UA cloudy, ketones 20, trace leukocytes, 0-5 rbc and wbc, 21-50 squamous epitheal ____________________________________________   EKG  None  ____________________________________________    RADIOLOGY  None  ____________________________________________   PROCEDURES  Procedures  ____________________________________________   INITIAL IMPRESSION / ASSESSMENT AND PLAN / ED COURSE  Pertinent labs & imaging results that were available during my care of the patient were reviewed by me and considered in my medical decision making (see chart for details).   Patient brought to the emergency department today because of concerns for some weakness today.  The father did arrive to the emergency department I was able to get a little further history.  The father states that part of the issue is that she also did not want to drink her Ensure this morning.  She states that she seemed to have been acting slightly abnormal yesterday as well.  He states that the patient is frequently tachycardic and that is part of her syndrome.  He has not noticed any other acute changes.  Work-up here in the emergency department without any obvious cause of the patient's weakness.  Did discuss negative work-up here with the father.  Did feel comfortable taking her home at this time.  ____________________________________________   FINAL CLINICAL IMPRESSION(S) / ED DIAGNOSES  Final diagnoses:  Weakness     Note: This dictation was prepared with Dragon dictation. Any transcriptional errors that result from this process are unintentional     [name], Gordan Grell, MD 03/29/18 2219

## 2018-03-29 NOTE — ED Notes (Signed)
Pt had bowel movement. Pt cleaned and repositioned in bed. This RN will continue to monitor.

## 2018-03-29 NOTE — ED Notes (Signed)
Legal guardian at bedside and informed of pt's d/c. Legal guardian will be taking pt home.

## 2018-03-29 NOTE — Discharge Instructions (Addendum)
Please seek medical attention for any high fevers, chest pain, shortness of breath, change in behavior, persistent vomiting, bloody stool or any other new or concerning symptoms.  

## 2018-03-29 NOTE — ED Triage Notes (Signed)
Pt presents to ED from home via acems with c/o new onset immobility. 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 Father of pt reported to EMS that pt is normally able to walk with heavy assistance from couch to bed. Pt was unable to ambulate at all today according to father. Pt's father states this is new for the pt. Pt's father also reported that pt felt hot to touch today. 98.6 axillary temp for EMS. Hx of scoliosis. Allergies to codeine. Pt is non-verbal and appears to be in no acute distress at this time.

## 2018-03-29 NOTE — ED Notes (Signed)
This RN contacted Father of pt, Alicia Duncan, is legal guardian. Legal guardian of pt successfully notified [facility] and legal guardian gave consent to treat. Alicia BloomKelley, RN verified this information.

## 2018-03-29 NOTE — ED Notes (Signed)
Pt incontinent at baseline according to father who is caregiver.

## 2018-06-12 ENCOUNTER — Telehealth: Payer: Self-pay | Admitting: Neurology

## 2018-06-12 MED ORDER — OXCARBAZEPINE 300 MG PO TABS: 600.0000 mg | ORAL_TABLET | Freq: Two times a day (BID) | ORAL | 5 refills | Status: DC

## 2018-06-12 NOTE — Telephone Encounter (Signed)
Patient's father called for refill on Trileptal. Did not appear that Dr. Anne Hahn had prescribed it before, but in chart review and talking with the dad, pt has transferred cared from [facility] neuro. I refilled Rx to [facility], 1 mo with 5 ref.

## 2018-08-11 ENCOUNTER — Other Ambulatory Visit: Payer: Self-pay | Admitting: Neurology

## 2018-09-12 ENCOUNTER — Other Ambulatory Visit: Payer: Self-pay

## 2018-09-12 ENCOUNTER — Emergency Department
Admission: EM | Admit: 2018-09-12 | Discharge: 2018-09-13 | Disposition: A | Discharge status: Home / Self Care | Payer: Medicare Other | Attending: Emergency Medicine | Admitting: Emergency Medicine

## 2018-09-12 ENCOUNTER — Emergency Department: Payer: Medicare Other

## 2018-09-12 ENCOUNTER — Encounter: Payer: Self-pay | Admitting: Emergency Medicine

## 2018-09-12 DIAGNOSIS — K5900 Constipation, unspecified: Secondary | ICD-10-CM | POA: Insufficient documentation

## 2018-09-12 DIAGNOSIS — F842 Rett's syndrome: Secondary | ICD-10-CM | POA: Diagnosis not present

## 2018-09-12 DIAGNOSIS — R569 Unspecified convulsions: Secondary | ICD-10-CM | POA: Insufficient documentation

## 2018-09-12 DIAGNOSIS — Z79899 Other long term (current) drug therapy: Secondary | ICD-10-CM | POA: Diagnosis not present

## 2018-09-12 LAB — BASIC METABOLIC PANEL
Anion gap: 16 — ABNORMAL HIGH (ref 5–15)
BUN: 20 mg/dL (ref 6–20)
CO2: 16 mmol/L — ABNORMAL LOW (ref 22–32)
Calcium: 9.7 mg/dL (ref 8.9–10.3)
Chloride: 107 mmol/L (ref 98–111)
Creatinine, Ser: 0.7 mg/dL (ref 0.44–1.00)
GFR calc Af Amer: >60 mL/min (ref >60)
GFR calc non Af Amer: >60 mL/min (ref >60)
Glucose, Bld: 130 mg/dL — ABNORMAL HIGH (ref 70–99)
Potassium: 3.9 mmol/L (ref 3.5–5.1)
Sodium: 139 mmol/L (ref 135–145)

## 2018-09-12 LAB — CBC WITH DIFFERENTIAL/PLATELET
Abs Immature Granulocytes: 0.02 10*3/uL (ref 0.00–0.07)
Basophils Absolute: 0.1 10*3/uL (ref 0.0–0.1)
Basophils Relative: 1 %
Eosinophils Absolute: 0.2 10*3/uL (ref 0.0–0.5)
Eosinophils Relative: 3 %
HCT: 44.5 % (ref 36.0–46.0)
Hemoglobin: 14.8 g/dL (ref 12.0–15.0)
Immature Granulocytes: 0 %
Lymphocytes Relative: 23 %
Lymphs Abs: 1.7 10*3/uL (ref 0.7–4.0)
MCH: 32 pg (ref 26.0–34.0)
MCHC: 33.3 g/dL (ref 30.0–36.0)
MCV: 96.1 fL (ref 80.0–100.0)
Monocytes Absolute: 0.4 10*3/uL (ref 0.1–1.0)
Monocytes Relative: 6 %
Neutro Abs: 4.8 10*3/uL (ref 1.7–7.7)
Neutrophils Relative %: 67 %
Platelets: 149 10*3/uL — ABNORMAL LOW (ref 150–400)
RBC: 4.63 MIL/uL (ref 3.87–5.11)
RDW: 13.4 % (ref 11.5–15.5)
WBC: 7.2 10*3/uL (ref 4.0–10.5)
nRBC: 0 % (ref 0.0–0.2)

## 2018-09-12 MED ORDER — LORAZEPAM 2 MG/ML IJ SOLN
0.5000 mg | Freq: Once | INTRAMUSCULAR | Status: AC
  Administered 2018-09-12: 0.5 mg via INTRAVENOUS

## 2018-09-12 MED ORDER — POLYETHYLENE GLYCOL 3350 17 GM/SCOOP PO POWD: ORAL | 0 refills | Status: DC

## 2018-09-12 MED ORDER — LORAZEPAM 2 MG/ML IJ SOLN
1.0000 mg | Freq: Once | INTRAMUSCULAR | Status: DC
  Filled 2018-09-12: qty 1

## 2018-09-12 MED ORDER — SODIUM CHLORIDE 0.9 % IV BOLUS
1000.0000 mL | Freq: Once | INTRAVENOUS | Status: AC
  Administered 2018-09-12: 1000 mL via INTRAVENOUS

## 2018-09-12 MED ORDER — DOCUSATE SODIUM 100 MG PO CAPS: 200.0000 mg | ORAL_CAPSULE | Freq: Two times a day (BID) | ORAL | 0 refills | Status: DC

## 2018-09-12 NOTE — ED Notes (Signed)
Father in the hallway pacing, waiting on urine results; he understands I will check with lab as soon as I'm done with another task

## 2018-09-12 NOTE — ED Provider Notes (Addendum)
[facility] Emergency Department Provider Note  ____________________________________________  Time seen: Approximately 10:00 PM  I have reviewed the triage vital signs and the nursing notes.   HISTORY  Chief Complaint Seizures    Level 5 Caveat: Portions of the History and Physical including HPI and review of systems are unable to be completely obtained due to patient being a poor historian   HPI Alicia Duncan is a 43 y.o. female with a history of Rett syndrome and seizures who is brought to the ED today due to a seizure.  Today's seizure was consistent with her previous seizure pattern.  Father at bedside states that this happens 2 or 3 times a year.  She has been taking all her medications as usual and eating and drinking normally.  She has a liquid diet and has not had any trouble with constipation.     Past Medical History:  Diagnosis Date  . Dystonia   . Rett syndrome   . Seizures [facility])      Patient Active Problem List   Diagnosis Date Noted  . Cough 05/13/2016  . Pancytopenia ([place]) 05/13/2016  . Iron deficiency anemia 05/13/2016  . SIRS (systemic inflammatory response syndrome) ([place]) 05/11/2016  . Dental infection 05/11/2016  . 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 Seizure disorder ([place]) 05/11/2016  . Sepsis ([place]) 05/11/2016  . Seizure ([place]) 04/25/2013     History reviewed. No pertinent surgical history.   Prior to Admission medications   Medication Sig Start Date End Date Taking? Authorizing Provider  baclofen (LIORESAL) 10 MG tablet Take 10 mg by mouth 3 (three) times daily.    [provider]  feeding supplement, ENSURE ENLIVE, (ENSURE ENLIVE) LIQD Take 237 mLs by mouth 3 (three) times daily with meals. 05/13/16   Theodoro Grist, MD  ferrous gluconate (FERGON) 324 MG tablet TAKE 1 TABLET BY MOUTH DAILY WITH BREAKFAST 08/11/18   Kathrynn Ducking, MD  ferrous sulfate 220 (44 Fe) MG/5ML solution Take 6.8 mLs (60 mg of iron total) by  mouth 3 (three) times daily with meals. 05/13/16   Theodoro Grist, MD  FLUoxetine (PROZAC) 10 MG tablet Take 10 mg by mouth daily.    [provider]  LORazepam (ATIVAN) 1 MG tablet Take 1 mg by mouth 2 (two) times a day. 09/11/18   [provider]  omeprazole (PRILOSEC) 20 MG capsule Take 20 mg by mouth daily.    [provider]  Oxcarbazepine (TRILEPTAL) 300 MG tablet Take 2 tablets (600 mg total) by mouth 2 (two) times daily. 06/12/18   Star Age, MD  tiaGABine (GABITRIL) 2 MG tablet Take 1 tablet (2 mg total) by mouth 3 (three) times daily. 08/09/15   Earleen Newport, MD     Allergies Benadryl [diphenhydramine] and Penicillins   Family History  Problem Relation Age of Onset  . Seizures Mother   . Memory loss Father   . Diverticulosis Father   . Schizophrenia Brother     Social History Social History   Tobacco Use  . Smoking status: Never Smoker  . Smokeless tobacco: Never Used  Substance Use Topics  . Alcohol use: No  . Drug use: No    Review of Systems Level 5 Caveat: Portions of the History and Physical including HPI and review of systems are unable to be completely obtained due to patient being a poor historian   Constitutional:   No known fever.  ENT:   No rhinorrhea. Cardiovascular:   No chest pain  or syncope. Respiratory:   No dyspnea or cough. Gastrointestinal:   Negative for abdominal pain, vomiting and diarrhea.  Musculoskeletal:   Negative for focal pain or swelling ____________________________________________   PHYSICAL EXAM:  VITAL SIGNS: ED Triage Vitals  Enc Vitals Group     BP 09/12/18 1847 (!) 127/91     Pulse Rate 09/12/18 1847 (!) 120     Resp 09/12/18 1847 (!) 32     Temp 09/12/18 1847 99 F (37.2 C)     Temp Source 09/12/18 1847 Oral     SpO2 09/12/18 1845 94 %     Weight 09/12/18 1847 74 lb 15.3 oz (34 kg)     Height 09/12/18 1847 4\' 9"  (1.448 m)     Head Circumference --      Peak Flow --      Pain Score  --      Pain Loc --      Pain Edu? --      Excl. in GC? --     Vital signs reviewed, nursing assessments reviewed.   Constitutional: Awake, at baseline.. Non-toxic appearance. Eyes:   Conjunctivae are normal. EOMI. PERRL. ENT      Head:   Normocephalic and atraumatic.      Nose:   No congestion/rhinnorhea.       Mouth/Throat:   MMM, no pharyngeal erythema. No peritonsillar mass.       Neck:   No meningismus. Full ROM. Hematological/Lymphatic/Immunilogical:   No cervical lymphadenopathy. Cardiovascular:   Tachycardia heart rate a 100. Symmetric bilateral radial and DP pulses.  No murmurs. Cap refill less than 2 seconds. Respiratory:   Normal respiratory effort without tachypnea/retractions. Breath sounds are clear and equal bilaterally. No wheezes/rales/rhonchi. Gastrointestinal:   Soft and nontender.  Mild distention with tympany on percussion. There is no CVA tenderness.  No rebound, rigidity, or guarding.  Musculoskeletal:   Normal range of motion in all extremities. No joint effusions.  No lower extremity tenderness.  No edema. Neurologic:   Nonverbal at baseline.  Having twitching and spastic movements, also baseline Motor grossly intact. No acute focal neurologic deficits are appreciated.  Skin:    Skin is warm, dry and intact. No rash noted.  No petechiae, purpura, or bullae.  ____________________________________________    LABS (pertinent positives/negatives) (all labs ordered are listed, but only abnormal results are displayed) Labs Reviewed  BASIC METABOLIC PANEL - Abnormal; Notable for the following components:      Result Value   CO2 16 (*)    Glucose, Bld 130 (*)    Anion gap 16 (*)    All other components within normal limits  CBC WITH DIFFERENTIAL/PLATELET - Abnormal; Notable for the following components:   Platelets 149 (*)    All other components within normal limits  URINALYSIS, COMPLETE (UACMP) WITH MICROSCOPIC  PREGNANCY, URINE    ____________________________________________   EKG Interpreted by me Sinus tachycardia rate 121, normal axis intervals.  Poor R wave progression.  Normal ST segments and T waves.   ____________________________________________    RADIOLOGY  Dg Abdomen 1 View  Result Date: 09/12/2018 CLINICAL DATA:  Witnessed seizure. Patient has history of Rhett's syndrome and seizures. EXAM: ABDOMEN - 1 VIEW COMPARISON:  Chest x-ray 01/21/2018 FINDINGS: Exam demonstrates gaseous distention of the stomach. Multiple air-filled loops of large and small bowel without bowel dilatation. Moderate fecal retention over the rectum. No free peritoneal air. Visualized lung bases are normal. Spinal stabilization hardware is present and intact from the thoracic  spine to the sacroiliac region. No evidence of acute fracture. IMPRESSION: Nonspecific, nonobstructive bowel gas pattern with moderate gaseous distention of the stomach. Moderate fecal retention over the rectum likely causing impaction. No acute fracture. Electronically Signed   By: Elberta Fortisaniel  Boyle M.D.   On: 09/12/2018 20:08    ____________________________________________   PROCEDURES Procedures  ____________________________________________    CLINICAL IMPRESSION / ASSESSMENT AND PLAN / ED COURSE  Pertinent labs & imaging results that were available during my care of the patient were reviewed by me and considered in my medical decision making (see chart for details).   Alicia Duncan was evaluated in Emergency Department on 09/12/2018 for the symptoms described in the history of present illness. She was evaluated in the context of the global COVID-19 pandemic, which necessitated consideration that the patient might be at risk for infection with the SARS-CoV-2 virus that causes COVID-19. Institutional protocols and algorithms that pertain to the evaluation of patients at risk for COVID-19 are in a state of rapid change based on information released by  regulatory bodies including the CDC and federal and state organizations. These policies and algorithms were followed during the patient's care in the ED.   Patient presents after having had a seizure.  This is a recurrent issue for her.  She is otherwise been in her usual state of health and back to baseline currently.  She is nontoxic, does not appear septic.  Will check labs and urinalysis.  Abdomen is somewhat distended and tympanitic so get a KUB as well.  ----------------------------------------- 10:05 PM on 09/12/2018 -----------------------------------------  KUB shows moderate gaseous distention of the intestine with fecal retention in the colon consistent with constipation.  I will recommend MiraLAX for a few days and Colace ongoing.  Still hoping to get a urinalysis to ensure she does not have a UTI which may decompensate her seizure disorder.  Clinical Course as of Sep 12 2350  Sun Sep 12, 2018  2351 Heart rate 70, vital signs normal.  Urine sample seems to have been lost in the lab.  Father states he is not willing to have this redone if it cannot be located and will follow up with Dr. instead.   [PS]    Clinical Course User Index [PS] Sharman CheekStafford, Talisha Erby, MD     ____________________________________________   FINAL CLINICAL IMPRESSION(S) / ED DIAGNOSES    Final diagnoses:  Seizure [facility](HCC)  Constipation   ED Discharge Orders    None      Portions of this note were generated with dragon dictation software. Dictation errors may occur despite best attempts at proofreading.   Sharman CheekStafford, Stephen Turnbaugh, MD 09/12/18 [id number]    Sharman CheekStafford, Arlyn Buerkle, MD 09/12/18 Adin [id number]    Sharman CheekStafford, Danai Gotto, MD 09/12/18 2352

## 2018-09-12 NOTE — ED Provider Notes (Signed)
-----------------------------------------   11:11 PM on 09/12/2018 -----------------------------------------   Assuming care from Dr. Joni Fears.  In short, Alicia Duncan is a 43 y.o. female with a chief complaint of seizure.  Refer to the original H&P for additional details.  The current plan of care is to follow up urinalysis and treat appropriately.  Anticipate discharge home.    ----------------------------------------- 12:09 AM on 09/13/2018 -----------------------------------------  Unfortunately the patient's urine was misplaced.  Father informed and he wants to take the patient home.  He understands he needs to follow up with primary care provider.  Discharged with the papers from Dr. Joni Fears.   Hinda Kehr, MD 09/13/18 0010

## 2018-09-12 NOTE — ED Notes (Signed)
Per father, pt's seizure lasted about 20 minutes. Per father, pt has been taking her medications daily and has not missed any doses. Per father patient has not had a seizure in several months and is followed by Dr. Jannifer Franklin at [facility].

## 2018-09-12 NOTE — ED Triage Notes (Signed)
Pt arrived via EMS from home with father with reports of witnessed seizure. Pt has hx of seizures and was witness by father who is the patient's caregiver. Pt has hx of Rhett's Syndrome and seizures.  Per EMS pt had grand mal seizure about 20 minutes PTA.  Per EMS, enroute to ED, pt had rapid facial movements. Pt is alert at this time. Pt is non-verbal at baseline.   Father is POA.

## 2018-09-12 NOTE — Discharge Instructions (Addendum)
Please continue taking all your medications and follow-up with your primary care doctor and neurologist this week.  Continue giving Ativan as needed for agitation. Be sure and follow up with her primary care doctor since a urine specimen was not successfully evaluated; it may be that her doctor wants to check her urine as an outpatient to make sure there was no sign of infection.  Your x-ray does show a degree of constipation which can be managed with MiraLAX over the next 3 days and taking Colace daily to prevent recurrence.

## 2018-09-12 NOTE — ED Notes (Signed)
Report received, care assumed.

## 2018-09-12 NOTE — ED Notes (Addendum)
In to speak with father to let him know that urine specimen was sent when collected but cannot be located by lab; he does not want pt catheterized again for another specimen; he says he wants to take "the baby home"; says she's had a long hard day and a long hard life and they will follow up with her MD; Dr Joni Fears aware;

## 2018-09-12 NOTE — ED Notes (Signed)
Pt's home meds crushed for father to administer with applesauce. Per order from EDP. Pt attempting to get out of bed. Pt cleaned and changed, lights dimmed and tv on. Father in room with pt.

## 2018-09-12 NOTE — ED Notes (Signed)
Patient's father at bedside

## 2018-09-13 NOTE — ED Notes (Signed)
In to let father know I'm awaiting MD to return to desk so I can let him know he's ready to take his daughter home;

## 2018-09-20 ENCOUNTER — Telehealth: Payer: Self-pay | Admitting: Neurology

## 2018-09-20 NOTE — Telephone Encounter (Signed)
Pt father Lyndell Gillyard returned call and stated he would prefer a in office visit for the patient, COVID questions asked , no exposure no symptoms . Pt was advised to provide own mask.

## 2018-09-20 NOTE — Telephone Encounter (Signed)
Due to current COVID 19 pandemic, our office is severely reducing in office visits until further notice, in order to minimize the risk to our patients and healthcare providers.   Called patient to discuss a virtual visit or in office visit for 6/17 appt. No answer, LVM and gave contact info for patient to call back.

## 2018-09-20 NOTE — Telephone Encounter (Signed)
Noted, thanks!

## 2018-09-22 ENCOUNTER — Ambulatory Visit (INDEPENDENT_AMBULATORY_CARE_PROVIDER_SITE_OTHER): Payer: Medicare Other | Admitting: Neurology

## 2018-09-22 ENCOUNTER — Encounter: Payer: Self-pay | Admitting: Neurology

## 2018-09-22 ENCOUNTER — Other Ambulatory Visit: Payer: Self-pay

## 2018-09-22 VITALS — BP 110/72 | Temp 98.0°F | Wt 75.0 lb

## 2018-09-22 DIAGNOSIS — G40909 Epilepsy, unspecified, not intractable, without status epilepticus: Secondary | ICD-10-CM

## 2018-09-22 DIAGNOSIS — F842 Rett's syndrome: Secondary | ICD-10-CM

## 2018-09-22 MED ORDER — TIAGABINE HCL 4 MG PO TABS: 4.0000 mg | ORAL_TABLET | Freq: Two times a day (BID) | ORAL | 1 refills | Status: DC

## 2018-09-22 NOTE — Patient Instructions (Signed)
We will increase the Gabitril to 4 mg twice a day.

## 2018-09-22 NOTE — Progress Notes (Signed)
Reason for visit: Seizures  Alicia KohlerHeather M Duncan is an 43 y.o. female  History of present illness:  Alicia Duncan is a 43 year old white female with a history of Rett syndrome and intractable seizures.  The patient was in the emergency room on 12 September 2018 with a prolonged seizure event.  The family describes some of her seizures as being associated with rolling from side to side, bumping her head into the wall.  They placed pillows against the wall to protect her head.  The patient has been on Trileptal, her most recent levels were well into the therapeutic range.  She is on very low-dose Gabitril taking 2 mg 3 times daily.  The patient is sleeping well at night, she seems to eat well.  She tends to move all the time, she remains quite thin.  Past Medical History:  Diagnosis Date  . Dystonia   . Rett syndrome   . Seizures (HCC)     History reviewed. No pertinent surgical history.  Family History  Problem Relation Age of Onset  . Seizures Mother   . Memory loss Father   . Diverticulosis Father   . Schizophrenia Brother     Social history:  reports that she has never smoked. She has never used smokeless tobacco. She reports that she does not drink alcohol or use drugs.    Allergies  Allergen Reactions  . Benadryl [Diphenhydramine]   . Penicillins Other (See Comments)    Has patient had a PCN reaction causing immediate rash, facial/tongue/throat swelling, SOB or lightheadedness with hypotension: Unknown Has patient had a PCN reaction causing severe rash involving mucus membranes or skin necrosis: Unknown Has patient had a PCN reaction that required hospitalization: Unknown Has patient had a PCN reaction occurring within the last 10 years: Unknown If all of the above answers are "NO", then may proceed with Cephalosporin use.     Medications:  Prior to Admission medications   Medication Sig Start Date End Date Taking? Authorizing Provider  baclofen (LIORESAL) 10 MG tablet Take 10  mg by mouth 3 (three) times daily.   Yes [provider]  feeding supplement, ENSURE ENLIVE, (ENSURE ENLIVE) LIQD Take 237 mLs by mouth 3 (three) times daily with meals. 05/13/16  Yes [name], [name], MD  ferrous gluconate (FERGON) 324 MG tablet TAKE 1 TABLET BY MOUTH DAILY WITH BREAKFAST 08/11/18  Yes York SpanielWillis, Charles K, MD  FLUoxetine (PROZAC) 10 MG tablet Take 10 mg by mouth daily.   Yes [provider]  LORazepam (ATIVAN) 1 MG tablet Take 1 mg by mouth 2 (two) times a day. 09/11/18  Yes [provider]  omeprazole (PRILOSEC) 20 MG capsule Take 20 mg by mouth daily.   Yes [provider]  Oxcarbazepine (TRILEPTAL) 300 MG tablet Take 2 tablets (600 mg total) by mouth 2 (two) times daily. Patient not taking: Reported on 09/22/2018 06/12/18   Huston FoleyAthar, Saima, MD  polyethylene glycol powder (GLYCOLAX/MIRALAX) 17 GM/SCOOP powder 1 cap full in a full glass of water, two times a day for 3 days. Patient not taking: Reported on 09/22/2018 09/12/18   Sharman CheekStafford, Phillip, MD  tiaGABine (GABITRIL) 4 MG tablet Take 1 tablet (4 mg total) by mouth 2 (two) times daily. 09/22/18   York SpanielWillis, Charles K, MD    ROS:  Out of a complete 14 system review of symptoms, the patient complains only of the following symptoms, and all other reviewed systems are negative.  Seizures Mental retardation  Blood pressure 110/72, temperature  98 F (36.7 C), temperature source Oral, weight 75 lb (34 kg).  Physical Exam  General: The patient is alert, nonverbal, not fully cooperative.  Skin: No significant peripheral edema is noted.   Neurologic Exam  Mental status: The patient is alert, nonverbal.   Cranial nerves: Facial symmetry is present.  The patient is nonverbal.  She has symmetric facial grimace, will blink to threat bilaterally.  Motor: The patient has good strength in the upper extremities bilaterally, she has braces on both legs.  Sensory examination: The patient reacts to pinprick  sensation on all fours.  Coordination: The patient cannot cooperate for cerebellar testing.  Gait and station: The patient is wheelchair-bound, she is nonambulatory.  Reflexes: Deep tendon reflexes are symmetric.   Assessment/Plan:  1.  Rett syndrome  2.  Intractable seizures  I indicated that the family would like to see a video of the patient's "rolling seizures".  Rolling side to side may represent behavior rather than a true seizure.  I will increase the Gabitril to 4 mg twice daily, this still is quite a low dose of this medication, the maximum dose is 56 mg daily.  She will remain on the current dose of the Trileptal, she had good levels on the last visit.  The patient will follow-up in 6 months.  Jill Alexanders MD 09/22/2018 10:58 AM  [facility] [address] [place], [place] [address]  Phone [phone] Fax [phone]

## 2018-11-10 ENCOUNTER — Encounter: Payer: Medicare Other | Attending: Family Medicine | Admitting: Dietician

## 2018-11-10 ENCOUNTER — Other Ambulatory Visit: Payer: Self-pay

## 2018-11-10 DIAGNOSIS — E785 Hyperlipidemia, unspecified: Secondary | ICD-10-CM

## 2018-11-10 DIAGNOSIS — F842 Rett's syndrome: Secondary | ICD-10-CM

## 2018-11-10 NOTE — Progress Notes (Signed)
Medical Nutrition Therapy: Visit start time: 0945  end time: 1020  Assessment:  Diagnosis: hyperlipidemia, Rett Syndrome Past medical history: seizures, depression, GERD Psychosocial issues/ stress concerns: history of depression; unable to communicate verbally; mother died 2 weeks ago   Current weight: low BMI per MD notes  Progress and evaluation:   Spoke with patient's father by phone.   Patient consumes soft/ pureed diet as she has no teeth, no or very limited chewing ability.   Father is primary caregiver; some help from homecare nurse.   Father is unable to cook much, offers pre-made meals which he heats and mashes, cuts foods for patient to eat. He would like to increase the variety of foods in [name]'s diet.   MD notes indicate recent LDL of 166mg /dl.    Physical activity: moves on hands and knees in home; does arm exercises; plays with/ tosses blankets for active play  Dietary Intake:  Usual eating pattern includes 3 meals and ? snacks per day. Dining out frequency: 0 meals per week.  Breakfast: 10am applesauce with meds Snack: Ensure, occasional flavored yogurt Lunch: Hormel Complete meal or pot pie, sometimes with yogurt Snack: Ensure Supper: Hormel Complete meal or pot pie Snack: pudding or yogurt Beverages: Ensure Plus 3 bottles daily; some water  Nutrition Care Education: Topics covered: hyperlipidemia, basic nutrition Basic nutrition: importance of adequate protein and calorie nutrition; inclusion of all basic food groups    Weight control: Importance of adequate calorie and protein intake; offering food and/or liquid nutrition supplement at regular intervals Hyperlipidemia: healthy and unhealthy fats, role of fiber (patient will not be able to consume much intact dietary fiber due to lack of chewing ability)   Nutritional Diagnosis:  [place]-2.2 Altered nutrition-related laboratory As related to hyperlipidemia.  As evidenced by recent LDL of  166mg /dl.  Intervention:   Instruction and discussion as noted above.  RD will investigate other food and meal options for patient's dietary needs and will contact her father to discuss. Will provide written resources as applicable.  Patient's current diet (per father's verbal report) does not seem to be high in saturated fat or dietary cholesterol. Fiber intake is likely low due to patient's limited chewing ability.    Learner/ who was taught:  . Caregiver/ guardian -- father Alicia Duncan  Level of understanding: Marland Kitchen Verbalizes/ demonstrates competency   Demonstrated degree of understanding via:   Teach back Learning barriers: . None (father) . Communication limitations (patient) . Physical limitations (patient)   Willingness to learn/ readiness for change: . Eager, change in progress   Monitoring and Evaluation:  Dietary intake, blood lipids, and body weight      follow up: RD will contact patient's father in about 1 week

## 2018-11-14 ENCOUNTER — Other Ambulatory Visit: Payer: Self-pay | Admitting: Neurology

## 2018-11-17 ENCOUNTER — Telehealth: Payer: Self-pay | Admitting: Dietician

## 2018-11-17 ENCOUNTER — Encounter: Payer: Self-pay | Admitting: Dietician

## 2018-11-17 NOTE — Progress Notes (Signed)
Sent communication to referring provider regarding phone visit and subsequent follow-up.

## 2018-11-17 NOTE — Telephone Encounter (Signed)
Called patient's home to speak with her father regarding menu and meal options for soft diet for adequate nutrition and promoting healthy blood lipid levels. Left voicemail message for him to call back; will send menus by mail if no response.

## 2018-12-01 ENCOUNTER — Other Ambulatory Visit: Payer: Self-pay | Admitting: Neurology

## 2018-12-23 ENCOUNTER — Other Ambulatory Visit: Payer: Self-pay | Admitting: Neurology

## 2019-01-16 ENCOUNTER — Other Ambulatory Visit: Payer: Self-pay | Admitting: Neurology

## 2019-01-18 ENCOUNTER — Other Ambulatory Visit: Payer: Self-pay | Admitting: Neurology

## 2019-02-01 ENCOUNTER — Other Ambulatory Visit: Payer: Self-pay | Admitting: Neurology

## 2019-03-05 ENCOUNTER — Other Ambulatory Visit: Payer: Self-pay | Admitting: Neurology

## 2019-03-16 ENCOUNTER — Ambulatory Visit (INDEPENDENT_AMBULATORY_CARE_PROVIDER_SITE_OTHER): Payer: Medicare Other | Admitting: Neurology

## 2019-03-16 ENCOUNTER — Other Ambulatory Visit: Payer: Self-pay

## 2019-03-16 ENCOUNTER — Encounter: Payer: Self-pay | Admitting: Neurology

## 2019-03-16 VITALS — BP 165/93 | HR 133 | Temp 98.0°F | Ht 60.0 in

## 2019-03-16 DIAGNOSIS — G40909 Epilepsy, unspecified, not intractable, without status epilepticus: Secondary | ICD-10-CM | POA: Diagnosis not present

## 2019-03-16 DIAGNOSIS — F842 Rett's syndrome: Secondary | ICD-10-CM

## 2019-03-16 DIAGNOSIS — R000 Tachycardia, unspecified: Secondary | ICD-10-CM

## 2019-03-16 MED ORDER — TIAGABINE HCL 4 MG PO TABS: 4.0000 mg | ORAL_TABLET | Freq: Three times a day (TID) | ORAL | 5 refills | Status: DC

## 2019-03-16 NOTE — Patient Instructions (Signed)
I will increase Gabitril to 4 mg three times a day  Continue Trileptal   I will check lab work today  Please call for an increase in seizure activity   Return in 6 months or sooner if needed

## 2019-03-16 NOTE — Progress Notes (Signed)
PATIENT: Alicia Duncan DOB: 26-Sep-1975  REASON FOR VISIT: follow up HISTORY FROM: patient  HISTORY OF PRESENT ILLNESS: Today 03/16/19  Alicia Duncan is a 43 year old female with a history of Rett syndrome and intractable seizures.  Her seizures have been characterized as rolling from side to side.  She remains on Trileptal and Gabitril.  Her father reports she suffered a seizure this morning.  He says she slid off the couch, was rolling around on the floor.  She has been compliant with medications.  He says this is the first big seizure she has had in a few months.  She will have "lightning bolt" seizures, characterized by sudden jerk to one side, are very brief.  He says these may happen every 2 to 3 weeks.  She also hyperventilates multiple times during the day.  She has a sitter 6 days a week.  Her father reports she is at baseline after the seizure this morning.  She is nonverbal.  Otherwise, she has been doing well.  She is quite fidgety, moving all the time.  She presents today for evaluation accompanied by her father.  HISTORY 09/22/2018 Dr. Jannifer Franklin: Alicia Duncan is a 43 year old white female with a history of Rett syndrome and intractable seizures.  The patient was in the emergency room on 12 September 2018 with a prolonged seizure event.  The family describes some of her seizures as being associated with rolling from side to side, bumping her head into the wall.  They placed pillows against the wall to protect her head.  The patient has been on Trileptal, her most recent levels were well into the therapeutic range.  She is on very low-dose Gabitril taking 2 mg 3 times daily.  The patient is sleeping well at night, she seems to eat well.  She tends to move all the time, she remains quite thin.  REVIEW OF SYSTEMS: Out of a complete 14 system review of symptoms, the patient complains only of the following symptoms, and all other reviewed systems are negative.  Seizures  ALLERGIES: Allergies   Allergen Reactions  . Benadryl [Diphenhydramine]   . Penicillins Other (See Comments)    Has patient had a PCN reaction causing immediate rash, facial/tongue/throat swelling, SOB or lightheadedness with hypotension: Unknown Has patient had a PCN reaction causing severe rash involving mucus membranes or skin necrosis: Unknown Has patient had a PCN reaction that required hospitalization: Unknown Has patient had a PCN reaction occurring within the last 10 years: Unknown If all of the above answers are "NO", then may proceed with Cephalosporin use.     HOME MEDICATIONS: Outpatient Medications Prior to Visit  Medication Sig Dispense Refill  . baclofen (LIORESAL) 10 MG tablet Take 10 mg by mouth 3 (three) times daily.    . feeding supplement, ENSURE ENLIVE, (ENSURE ENLIVE) LIQD Take 237 mLs by mouth 3 (three) times daily with meals. 237 mL 12  . ferrous gluconate (FERGON) 324 MG tablet TAKE 1 TABLET BY MOUTH EVERY DAY WITH BREAKFAST 90 tablet 1  . FLUoxetine (PROZAC) 10 MG tablet Take 10 mg by mouth daily.    Marland Kitchen LORazepam (ATIVAN) 1 MG tablet TAKE 1 TABLET BY MOUTH TWICE A DAY 60 tablet 3  . omeprazole (PRILOSEC) 20 MG capsule Take 20 mg by mouth daily.    . tiaGABine (GABITRIL) 4 MG tablet TAKE 1 TABLET (4 MG TOTAL) BY MOUTH 2 (TWO) TIMES DAILY. 180 tablet 1  . Oxcarbazepine (TRILEPTAL) 300 MG tablet TAKE 2 TABLETS (  600 MG TOTAL) BY MOUTH 2 (TWO) TIMES DAILY. 120 tablet 1  . polyethylene glycol powder (GLYCOLAX/MIRALAX) 17 GM/SCOOP powder 1 cap full in a full glass of water, two times a day for 3 days. (Patient not taking: Reported on 09/22/2018) 255 g 0   No facility-administered medications prior to visit.     PAST MEDICAL HISTORY: Past Medical History:  Diagnosis Date  . Dystonia   . Rett syndrome   . Seizures (HCC)     PAST SURGICAL HISTORY: No past surgical history on file.  FAMILY HISTORY: Family History  Problem Relation Age of Onset  . Seizures Mother   . Memory loss  Father   . Diverticulosis Father   . Schizophrenia Brother     SOCIAL HISTORY: Social History   Socioeconomic History  . Marital status: Single    Spouse name: Not on file  . Number of children: Not on file  . Years of education: Not on file  . Highest education level: Not on file  Occupational History  . Not on file  Social Needs  . Financial resource strain: Not on file  . Food insecurity    Worry: Not on file    Inability: Not on file  . Transportation needs    Medical: Not on file    Non-medical: Not on file  Tobacco Use  . Smoking status: Never Smoker  . Smokeless tobacco: Never Used  Substance and Sexual Activity  . Alcohol use: No  . Drug use: No  . Sexual activity: Not on file  Lifestyle  . Physical activity    Days per week: Not on file    Minutes per session: Not on file  . Stress: Not on file  Relationships  . Social Musician on phone: Not on file    Gets together: Not on file    Attends religious service: Not on file    Active member of club or organization: Not on file    Attends meetings of clubs or organizations: Not on file    Relationship status: Not on file  . Intimate partner violence    Fear of current or ex partner: Not on file    Emotionally abused: Not on file    Physically abused: Not on file    Forced sexual activity: Not on file  Other Topics Concern  . Not on file  Social History Narrative  . Not on file   PHYSICAL EXAM  Vitals:   03/16/19 1050  BP: (!) 165/93  Pulse: (!) 133  Temp: 98 F (36.7 C)  Height: 5' (1.524 m)   Body mass index is 14.65 kg/m.  Generalized: Well developed, in no acute distress  Neurological examination  Mentation: Alert, nonverbal Cranial nerve II-XII: Pupils were equal round reactive to light, facial symmetry noted Motor: Good strength in upper extremities, reaching towards me, braces on legs Sensory: Aware of touch to all extremities Coordination: Unable to perform.  Gait and  station: She is wheelchair-bound, she was able to sit somewhat upright in chair Reflexes: Deep tendon reflexes are symmetric    DIAGNOSTIC DATA (LABS, IMAGING, TESTING) - I reviewed patient records, labs, notes, testing and imaging myself where available.  Lab Results  Component Value Date   WBC 7.2 09/12/2018   HGB 14.8 09/12/2018   HCT 44.5 09/12/2018   MCV 96.1 09/12/2018   PLT 149 (L) 09/12/2018      Component Value Date/Time   NA 139 09/12/2018 1851  K 3.9 09/12/2018 1851   CL 107 09/12/2018 1851   CO2 16 (L) 09/12/2018 1851   GLUCOSE 130 (H) 09/12/2018 1851   BUN 20 09/12/2018 1851   CREATININE 0.70 09/12/2018 1851   CALCIUM 9.7 09/12/2018 1851   PROT 6.7 01/21/2018 1518   ALBUMIN 4.0 01/21/2018 1518   AST 37 01/21/2018 1518   ALT 30 01/21/2018 1518   ALKPHOS 56 01/21/2018 1518   BILITOT 0.4 01/21/2018 1518   GFRNONAA >60 09/12/2018 1851   GFRAA >60 09/12/2018 1851   No results found for: CHOL, HDL, LDLCALC, LDLDIRECT, TRIG, CHOLHDL No results found for: NWGN5AHGBA1C Lab Results  Component Value Date   VITAMINB12 880 05/13/2016   No results found for: TSH    ASSESSMENT AND PLAN 43 y.o. year old female  has a past medical history of Dystonia, Rett syndrome, and Seizures (HCC). here with:  1.  Rett syndrome 2.  Seizures  She suffered a seizure this morning that was characterized by rolling in the floor.  Rolling side to side may represent behavior rather than a true seizure.  She will remain on Trileptal 600 mg twice a day.  I will increase Gabitril 4 mg 3 times a day.  Her heart rate was noted to be elevated, 130, but she was hyperventilating at different points during the visit, fidgety.  She did seem to relax when she sat in the chair beside her father.  Her father indicates she is at baseline following the seizure.  I will check routine lab work today, CBC, CMP, Trileptal level, along with TSH.  She will follow-up in 6 months or sooner if needed.  I did advise if  her symptoms worsen or she develops any new symptoms she should let us know.  I spent 15 minutes with the patient. 50% of this time was spent discussing her plan of care.   Margie EgeSarah Vedant Shehadeh, AGNP-C, DNP 03/16/2019, 12:21 PM [facility] [address], [address] [place], [place] [address] [phone]

## 2019-03-16 NOTE — Progress Notes (Signed)
I have read the note, and I agree with the clinical assessment and plan.  Chasya Zenz K Claude Waldman   

## 2019-03-21 ENCOUNTER — Telehealth: Payer: Self-pay | Admitting: *Deleted

## 2019-03-21 LAB — CBC WITH DIFFERENTIAL/PLATELET
Basophils Absolute: 0 10*3/uL (ref 0.0–0.2)
Basos: 0 %
EOS (ABSOLUTE): 0 10*3/uL (ref 0.0–0.4)
Eos: 0 %
Hematocrit: 44.5 % (ref 34.0–46.6)
Hemoglobin: 14.8 g/dL (ref 11.1–15.9)
Immature Grans (Abs): 0 10*3/uL (ref 0.0–0.1)
Immature Granulocytes: 0 %
Lymphocytes Absolute: 1 10*3/uL (ref 0.7–3.1)
Lymphs: 8 %
MCH: 29.3 pg (ref 26.6–33.0)
MCHC: 33.3 g/dL (ref 31.5–35.7)
MCV: 88 fL (ref 79–97)
Monocytes Absolute: 0.7 10*3/uL (ref 0.1–0.9)
Monocytes: 5 %
Neutrophils Absolute: 11.5 10*3/uL — ABNORMAL HIGH (ref 1.4–7.0)
Neutrophils: 87 %
Platelets: 172 10*3/uL (ref 150–450)
RBC: 5.05 x10E6/uL (ref 3.77–5.28)
RDW: 14.4 % (ref 11.7–15.4)
WBC: 13.2 10*3/uL — ABNORMAL HIGH (ref 3.4–10.8)

## 2019-03-21 LAB — COMPREHENSIVE METABOLIC PANEL
ALT: 38 IU/L — ABNORMAL HIGH (ref 0–32)
AST: 50 IU/L — ABNORMAL HIGH (ref 0–40)
Albumin/Globulin Ratio: 1.6 (ref 1.2–2.2)
Albumin: 5.1 g/dL — ABNORMAL HIGH (ref 3.8–4.8)
Alkaline Phosphatase: 110 IU/L (ref 39–117)
BUN/Creatinine Ratio: 20 (ref 9–23)
BUN: 11 mg/dL (ref 6–24)
Bilirubin Total: <0.2 mg/dL (ref 0.0–1.2)
CO2: 14 mmol/L — ABNORMAL LOW (ref 20–29)
Calcium: 10 mg/dL (ref 8.7–10.2)
Chloride: 104 mmol/L (ref 96–106)
Creatinine, Ser: 0.56 mg/dL — ABNORMAL LOW (ref 0.57–1.00)
GFR calc Af Amer: 131 mL/min/{1.73_m2} (ref >59)
GFR calc non Af Amer: 114 mL/min/{1.73_m2} (ref >59)
Globulin, Total: 3.2 g/dL (ref 1.5–4.5)
Glucose: 116 mg/dL — ABNORMAL HIGH (ref 65–99)
Potassium: 3.9 mmol/L (ref 3.5–5.2)
Sodium: 140 mmol/L (ref 134–144)
Total Protein: 8.3 g/dL (ref 6.0–8.5)

## 2019-03-21 LAB — 10-HYDROXYCARBAZEPINE: Oxcarbazepine SerPl-Mcnc: 15 ug/mL (ref 10–35)

## 2019-03-21 LAB — TSH: TSH: 2.3 u[IU]/mL (ref 0.450–4.500)

## 2019-03-21 NOTE — Telephone Encounter (Signed)
-----   Message from Suzzanne Cloud, NP sent at 03/21/2019  8:42 AM EST ----- Please call the patient's father.  Laboratory evaluation showed an elevated WBC 13.2 , for unknown reason?  Please ensure no signs of infection, UTI, URI, etc?  Absolute neutrophil count is elevated. May need to see PCP if any symptoms of infection or behavior abnormality. AST, ALT mildly elevated.  Oxcarbazepine level was therapeutic, TSH was normal.  Given elevated liver enzymes, can we recheck this in 3 months, can be done by PCP or come here just for lab work. We need to follow the liver, as could be adverse effect of Trileptal.

## 2019-03-21 NOTE — Telephone Encounter (Signed)
Fax confirmation received Dr. Maryland Pink for lab results.  [phone].

## 2019-03-21 NOTE — Telephone Encounter (Signed)
Relayed the results of labs per SS/NP.  Father and pt to follow up with Dr. Kary Kos for repeat labs if they are willing in 3 months or sooner as needed.  No s/s of infection per father.  He verbalized understanding.

## 2019-03-31 ENCOUNTER — Other Ambulatory Visit: Payer: Self-pay | Admitting: Neurology

## 2019-04-26 ENCOUNTER — Other Ambulatory Visit: Payer: Self-pay | Admitting: Neurology

## 2019-05-05 ENCOUNTER — Other Ambulatory Visit: Payer: Self-pay | Admitting: Neurology

## 2019-06-04 ENCOUNTER — Other Ambulatory Visit: Payer: Self-pay | Admitting: Neurology

## 2019-07-01 ENCOUNTER — Other Ambulatory Visit: Payer: Self-pay | Admitting: Neurology

## 2019-08-20 ENCOUNTER — Other Ambulatory Visit: Payer: Self-pay | Admitting: Neurology

## 2019-09-12 ENCOUNTER — Telehealth: Payer: Self-pay

## 2019-09-12 NOTE — Telephone Encounter (Signed)
Pt's father [name] called this office reporting that someone called him from this office last week asking him to bring the pt in ASAP. I advised him that I do not see any documentation regarding that in our records but thankfully the pt already has an appt scheduled for 09/14/2019 at 10:00am with Dr. Anne Hahn. I asked him to make sure pt checks in around 9:30am and pt's father said that they will do his best to get her here by then. Pt's father verbalized understanding of appt date and time.

## 2019-09-13 ENCOUNTER — Other Ambulatory Visit: Payer: Self-pay | Admitting: Neurology

## 2019-09-14 ENCOUNTER — Other Ambulatory Visit: Payer: Self-pay

## 2019-09-14 ENCOUNTER — Encounter: Payer: Self-pay | Admitting: Neurology

## 2019-09-14 ENCOUNTER — Ambulatory Visit (INDEPENDENT_AMBULATORY_CARE_PROVIDER_SITE_OTHER): Payer: Medicare Other | Admitting: Neurology

## 2019-09-14 VITALS — BP 145/92 | HR 84

## 2019-09-14 DIAGNOSIS — G40909 Epilepsy, unspecified, not intractable, without status epilepticus: Secondary | ICD-10-CM | POA: Diagnosis not present

## 2019-09-14 DIAGNOSIS — Z5181 Encounter for therapeutic drug level monitoring: Secondary | ICD-10-CM

## 2019-09-14 DIAGNOSIS — F842 Rett's syndrome: Secondary | ICD-10-CM

## 2019-09-14 MED ORDER — LORAZEPAM 1 MG PO TABS: 1.0000 mg | ORAL_TABLET | Freq: Two times a day (BID) | ORAL | 5 refills | Status: DC

## 2019-09-14 NOTE — Progress Notes (Signed)
Reason for visit: Rett syndrome, seizures  Alicia Duncan is an 44 y.o. 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  The patient has intermittent episodes of brief myoclonus throughout the day, she has more prominent events of seizure type episodes with rolling movements that may take her 5 to 30 minutes to resolve.  She has not had any events since last seen.  The patient is on low-dose Gabitril and she takes Trileptal.  In the past, she has been on Dilantin, clonazepam, and carbamazepine.  The patient is thin, but her family states that she eats fairly well, she has to be fed.  She has no teeth and has to go on a soft mechanical diet.  The patient is nonambulatory essentially, she has not had any falls or injuries since last seen.  She is nonverbal.  Past Medical History:  Diagnosis Date  . Dystonia   . Rett syndrome   . Seizures ([facility])     No past surgical history on file.  Family History  Problem Relation Age of Onset  . Seizures Mother   . Memory loss Father   . Diverticulosis Father   . Schizophrenia Brother     Social history:  reports that she has never smoked. She has never used smokeless tobacco. She reports that she does not drink alcohol or use drugs.    Allergies  Allergen Reactions  . Benadryl [Diphenhydramine]   . Penicillins Other (See Comments)    Has patient had a PCN reaction causing immediate rash, facial/tongue/throat swelling, SOB or lightheadedness with hypotension: Unknown Has patient had a PCN reaction causing severe rash involving mucus membranes or skin necrosis: Unknown Has patient had a PCN reaction that required hospitalization: Unknown Has patient had a PCN reaction occurring within the last 10 years: Unknown If all of the above answers are "NO", then may proceed with Cephalosporin use.     Medications:  Prior to Admission  medications   Medication Sig Start Date End Date Taking? Authorizing Provider  baclofen (LIORESAL) 10 MG tablet Take 10 mg by mouth 2 (two) times daily.    Yes [provider]  feeding supplement, ENSURE ENLIVE, (ENSURE ENLIVE) LIQD Take 237 mLs by mouth 3 (three) times daily with meals. 05/13/16  Yes Theodoro Grist, MD  ferrous gluconate (FERGON) 324 MG tablet TAKE 1 TABLET BY MOUTH EVERY DAY WITH BREAKFAST 02/01/19  Yes Kathrynn Ducking, MD  FLUoxetine (PROZAC) 10 MG tablet Take 10 mg by mouth daily.   Yes [provider]  LORazepam (ATIVAN) 1 MG tablet TAKE 1 TABLET BY MOUTH TWICE A DAY 05/09/19  Yes Kathrynn Ducking, MD  omeprazole (PRILOSEC) 20 MG capsule Take 20 mg by mouth daily.   Yes [provider]  Oxcarbazepine (TRILEPTAL) 300 MG tablet TAKE 2 TABLETS (600 MG TOTAL) BY MOUTH 2 (TWO) TIMES DAILY. 08/22/19  Yes Suzzanne Cloud, NP  polyethylene glycol powder (GLYCOLAX/MIRALAX) 17 GM/SCOOP powder 1 cap full in a full glass of water, two times a day for 3 days. 09/12/18  Yes Carrie Mew, MD  tiaGABine (GABITRIL) 2 MG tablet 2 (two) times daily.  07/29/19  Yes [provider]    ROS:  Out of a complete 14 system review of symptoms, the patient complains only of the following symptoms, and all other reviewed systems are negative.  Episodic hyperventilation Seizures Myoclonus  Blood pressure (!) 145/92, pulse 84.  Physical Exam  General: The patient is alert, but she will not follow commands for the examiner.  Skin: No significant peripheral edema is noted.   Neurologic Exam  Mental status: The patient is alert, the patient is nonverbal, does not follow commands.   Cranial nerves: Facial symmetry is present.  The patient is nonverbal.  She will blink to threat bilaterally.  Motor: The patient has good strength in all 4 extremities with resistance of the examiner.  Sensory examination: The patient responds briskly to pinching sensations of  the arms and legs.  Coordination: The patient will not follow commands for cerebellar testing.  Gait and station: The patient was not ambulated.  She has bilateral AFO braces.  Reflexes: Deep tendon reflexes are symmetric.   Assessment/Plan:  1.  Rett syndrome  2.  Intractable seizures  The patient appears to have frequent episodes of hyperventilation that in the past is felt to be self stimulating behavior.  She does have episodic myoclonus.  These events do not impact her day-to-day functional level.  We will keep her at the current level for medication dosing.  On last blood check, the white count was elevated and the liver enzymes are slightly elevated.  We will recheck blood work today.  A prescription for Ativan was sent in.  She will follow-up here in 6 months.  Marlan Palau MD 09/14/2019 10:27 AM  [facility] [address] [place], [place] [address]  Phone [phone] Fax [phone]

## 2019-09-16 ENCOUNTER — Telehealth: Payer: Self-pay | Admitting: Neurology

## 2019-09-16 LAB — CBC WITH DIFFERENTIAL/PLATELET
Basophils Absolute: 0.1 10*3/uL (ref 0.0–0.2)
Basos: 1 %
EOS (ABSOLUTE): 0 10*3/uL (ref 0.0–0.4)
Eos: 0 %
Hematocrit: 43.3 % (ref 34.0–46.6)
Hemoglobin: 14.4 g/dL (ref 11.1–15.9)
Immature Grans (Abs): 0 10*3/uL (ref 0.0–0.1)
Immature Granulocytes: 0 %
Lymphocytes Absolute: 1.4 10*3/uL (ref 0.7–3.1)
Lymphs: 22 %
MCH: 29.1 pg (ref 26.6–33.0)
MCHC: 33.3 g/dL (ref 31.5–35.7)
MCV: 88 fL (ref 79–97)
Monocytes Absolute: 0.3 10*3/uL (ref 0.1–0.9)
Monocytes: 5 %
Neutrophils Absolute: 4.7 10*3/uL (ref 1.4–7.0)
Neutrophils: 72 %
Platelets: 158 10*3/uL (ref 150–450)
RBC: 4.94 x10E6/uL (ref 3.77–5.28)
RDW: 15.6 % — ABNORMAL HIGH (ref 11.7–15.4)
WBC: 6.6 10*3/uL (ref 3.4–10.8)

## 2019-09-16 LAB — COMPREHENSIVE METABOLIC PANEL
ALT: 30 IU/L (ref 0–32)
AST: 29 IU/L (ref 0–40)
Albumin/Globulin Ratio: 1.5 (ref 1.2–2.2)
Albumin: 4.8 g/dL (ref 3.8–4.8)
Alkaline Phosphatase: 65 IU/L (ref 48–121)
BUN/Creatinine Ratio: 29 — ABNORMAL HIGH (ref 9–23)
BUN: 20 mg/dL (ref 6–24)
Bilirubin Total: 0.4 mg/dL (ref 0.0–1.2)
CO2: 16 mmol/L — ABNORMAL LOW (ref 20–29)
Calcium: 10.1 mg/dL (ref 8.7–10.2)
Chloride: 107 mmol/L — ABNORMAL HIGH (ref 96–106)
Creatinine, Ser: 0.7 mg/dL (ref 0.57–1.00)
GFR calc Af Amer: 121 mL/min/{1.73_m2} (ref >59)
GFR calc non Af Amer: 105 mL/min/{1.73_m2} (ref >59)
Globulin, Total: 3.2 g/dL (ref 1.5–4.5)
Glucose: 117 mg/dL — ABNORMAL HIGH (ref 65–99)
Potassium: 4.2 mmol/L (ref 3.5–5.2)
Sodium: 151 mmol/L — ABNORMAL HIGH (ref 134–144)
Total Protein: 8 g/dL (ref 6.0–8.5)

## 2019-09-16 LAB — 10-HYDROXYCARBAZEPINE: Oxcarbazepine SerPl-Mcnc: 4 ug/mL — ABNORMAL LOW (ref 10–35)

## 2019-09-16 NOTE — Telephone Encounter (Signed)
Called and left a message.  The blood work shows evidence of a subtherapeutic Trileptal level, not sure if the patient has missed doses prior to blood draw.  There is a hyperchloremic metabolic acidosis with an elevated sodium level.  The most common etiology would be vomiting or diarrhea issues with some dehydration.  CBC is unremarkable.  The patient should drink as much as possible, make sure that she is getting her medication doses in.  Liver profile is unremarkable.

## 2019-10-13 ENCOUNTER — Emergency Department
Admission: EM | Admit: 2019-10-13 | Discharge: 2019-10-13 | Disposition: A | Discharge status: Home / Self Care | Payer: Medicare Other | Attending: Emergency Medicine | Admitting: Emergency Medicine

## 2019-10-13 ENCOUNTER — Emergency Department: Payer: Medicare Other

## 2019-10-13 ENCOUNTER — Other Ambulatory Visit: Payer: Self-pay

## 2019-10-13 DIAGNOSIS — N390 Urinary tract infection, site not specified: Secondary | ICD-10-CM | POA: Insufficient documentation

## 2019-10-13 DIAGNOSIS — R410 Disorientation, unspecified: Secondary | ICD-10-CM | POA: Diagnosis not present

## 2019-10-13 DIAGNOSIS — F842 Rett's syndrome: Secondary | ICD-10-CM | POA: Insufficient documentation

## 2019-10-13 DIAGNOSIS — Z79899 Other long term (current) drug therapy: Secondary | ICD-10-CM | POA: Diagnosis not present

## 2019-10-13 DIAGNOSIS — G249 Dystonia, unspecified: Secondary | ICD-10-CM | POA: Insufficient documentation

## 2019-10-13 DIAGNOSIS — R569 Unspecified convulsions: Secondary | ICD-10-CM | POA: Diagnosis present

## 2019-10-13 LAB — URINALYSIS, COMPLETE (UACMP) WITH MICROSCOPIC
Bacteria, UA: NONE SEEN
Bilirubin Urine: NEGATIVE
Glucose, UA: NEGATIVE mg/dL
Ketones, ur: NEGATIVE mg/dL
Nitrite: NEGATIVE
Protein, ur: NEGATIVE mg/dL
RBC / HPF: >50 RBC/hpf — ABNORMAL HIGH (ref 0–5)
Specific Gravity, Urine: 1.025 (ref 1.005–1.030)
pH: 6 (ref 5.0–8.0)

## 2019-10-13 LAB — COMPREHENSIVE METABOLIC PANEL
ALT: 28 U/L (ref 0–44)
AST: 32 U/L (ref 15–41)
Albumin: 3.9 g/dL (ref 3.5–5.0)
Alkaline Phosphatase: 40 U/L (ref 38–126)
Anion gap: 6 (ref 5–15)
BUN: 23 mg/dL — ABNORMAL HIGH (ref 6–20)
CO2: 28 mmol/L (ref 22–32)
Calcium: 9 mg/dL (ref 8.9–10.3)
Chloride: 109 mmol/L (ref 98–111)
Creatinine, Ser: 0.61 mg/dL (ref 0.44–1.00)
GFR calc Af Amer: >60 mL/min (ref >60)
GFR calc non Af Amer: >60 mL/min (ref >60)
Glucose, Bld: 112 mg/dL — ABNORMAL HIGH (ref 70–99)
Potassium: 4.6 mmol/L (ref 3.5–5.1)
Sodium: 143 mmol/L (ref 135–145)
Total Bilirubin: 0.5 mg/dL (ref 0.3–1.2)
Total Protein: 7.4 g/dL (ref 6.5–8.1)

## 2019-10-13 LAB — CBC WITH DIFFERENTIAL/PLATELET
Abs Immature Granulocytes: 0.01 10*3/uL (ref 0.00–0.07)
Basophils Absolute: 0.1 10*3/uL (ref 0.0–0.1)
Basophils Relative: 1 %
Eosinophils Absolute: 0.2 10*3/uL (ref 0.0–0.5)
Eosinophils Relative: 4 %
HCT: 43 % (ref 36.0–46.0)
Hemoglobin: 13.9 g/dL (ref 12.0–15.0)
Immature Granulocytes: 0 %
Lymphocytes Relative: 35 %
Lymphs Abs: 2.2 10*3/uL (ref 0.7–4.0)
MCH: 29.5 pg (ref 26.0–34.0)
MCHC: 32.3 g/dL (ref 30.0–36.0)
MCV: 91.3 fL (ref 80.0–100.0)
Monocytes Absolute: 0.4 10*3/uL (ref 0.1–1.0)
Monocytes Relative: 7 %
Neutro Abs: 3.4 10*3/uL (ref 1.7–7.7)
Neutrophils Relative %: 53 %
Platelets: 90 10*3/uL — ABNORMAL LOW (ref 150–400)
RBC: 4.71 MIL/uL (ref 3.87–5.11)
RDW: 16.5 % — ABNORMAL HIGH (ref 11.5–15.5)
WBC: 6.4 10*3/uL (ref 4.0–10.5)
nRBC: 0 % (ref 0.0–0.2)

## 2019-10-13 MED ORDER — TIAGABINE HCL 2 MG PO TABS: 2.0000 mg | ORAL_TABLET | Freq: Once | ORAL | Status: DC

## 2019-10-13 MED ORDER — OXCARBAZEPINE 300 MG PO TABS
600.0000 mg | ORAL_TABLET | Freq: Once | ORAL | Status: AC
  Administered 2019-10-13: 600 mg via ORAL
  Filled 2019-10-13: qty 2

## 2019-10-13 MED ORDER — CEPHALEXIN 500 MG PO CAPS: 500.0000 mg | ORAL_CAPSULE | Freq: Three times a day (TID) | ORAL | 0 refills | Status: AC

## 2019-10-13 MED ORDER — LORAZEPAM 1 MG PO TABS
1.0000 mg | ORAL_TABLET | Freq: Once | ORAL | Status: AC
  Administered 2019-10-13: 1 mg via ORAL
  Filled 2019-10-13: qty 1

## 2019-10-13 MED ORDER — SODIUM CHLORIDE 0.9 % IV SOLN
1.0000 g | Freq: Once | INTRAVENOUS | Status: AC
  Administered 2019-10-13: 1 g via INTRAVENOUS
  Filled 2019-10-13: qty 10

## 2019-10-13 MED ORDER — SODIUM CHLORIDE 0.9 % IV BOLUS
1000.0000 mL | Freq: Once | INTRAVENOUS | Status: AC
  Administered 2019-10-13: 1000 mL via INTRAVENOUS

## 2019-10-13 MED ORDER — BACLOFEN 10 MG PO TABS
10.0000 mg | ORAL_TABLET | Freq: Once | ORAL | Status: AC
  Administered 2019-10-13: 10 mg via ORAL
  Filled 2019-10-13: qty 1

## 2019-10-13 NOTE — ED Provider Notes (Signed)
[facility] Emergency Department Provider Note  ____________________________________________   First MD Initiated Contact with Patient 10/13/19 0405     (approximate)  I have reviewed the triage vital signs and the nursing notes.   HISTORY  Chief Complaint Altered Mental Status    HPI Alicia Duncan is a 44 y.o. 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 History limited as pt is nonverbal. Per report, pt's brother found her with "stiff arms" flailing in the bed. Father had reported that she was more agitated than usual. Upon EMS arrival, pt agitated in bed. Non-ambulatory. Has been somewhat agitated w/ EMS en route. VSS.  Spoke with [name], father. Son who lives with him said she was "not doing well." He does not believe that there have been any missed medications or recent medication changes. Alicia Duncan has not had any fever, cough, or infectious sx. She has been eating/drinking normally.       Past Medical History:  Diagnosis Date  . Dystonia   . Rett syndrome   . Seizures [facility])     Patient Active Problem List   Diagnosis Date Noted  . Cough 05/13/2016  . Pancytopenia (HCC) 05/13/2016  . Iron deficiency anemia 05/13/2016  . SIRS (systemic inflammatory response syndrome) (HCC) 05/11/2016  . Dental infection 05/11/2016  . 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 Seizure disorder (HCC) 05/11/2016  . Sepsis (HCC) 05/11/2016  . Seizure (HCC) 04/25/2013    History reviewed. No pertinent surgical history.  Prior to Admission medications   Medication Sig Start Date End Date Taking? Authorizing Provider  baclofen (LIORESAL) 10 MG tablet Take 10 mg by mouth 2 (two) times daily.     [provider]  cephALEXin (KEFLEX) 500 MG capsule Take 1 capsule (500 mg total) by mouth 3 (three) times daily for 7 days. 10/13/19 10/20/19  Shaune Pollack, MD  feeding supplement, ENSURE ENLIVE, (ENSURE ENLIVE) LIQD Take 237 mLs by  mouth 3 (three) times daily with meals. 05/13/16   Katharina Caper, MD  ferrous gluconate (FERGON) 324 MG tablet TAKE 1 TABLET BY MOUTH EVERY DAY WITH BREAKFAST 02/01/19   York Spaniel, MD  FLUoxetine (PROZAC) 10 MG tablet Take 10 mg by mouth daily.    [provider]  LORazepam (ATIVAN) 1 MG tablet Take 1 tablet (1 mg total) by mouth 2 (two) times daily. 09/14/19   York Spaniel, MD  omeprazole (PRILOSEC) 20 MG capsule Take 20 mg by mouth daily.    [provider]  Oxcarbazepine (TRILEPTAL) 300 MG tablet TAKE 2 TABLETS (600 MG TOTAL) BY MOUTH 2 (TWO) TIMES DAILY. 08/22/19   Glean Salvo, NP  polyethylene glycol powder (GLYCOLAX/MIRALAX) 17 GM/SCOOP powder 1 cap full in a full glass of water, two times a day for 3 days. 09/12/18   Sharman Cheek, MD  tiaGABine (GABITRIL) 2 MG tablet 2 (two) times daily.  07/29/19   [provider]    Allergies Benadryl [diphenhydramine] and Penicillins  Family History  Problem Relation Age of Onset  . Seizures Mother   . Memory loss Father   . Diverticulosis Father   . Schizophrenia Brother     Social History Social History   Tobacco Use  . Smoking status: Never Smoker  . Smokeless tobacco: Never Used  Substance Use Topics  . Alcohol use: No  . Drug use: No    Review of Systems  Review of Systems  Unable to perform ROS: Patient nonverbal  ____________________________________________  PHYSICAL EXAM:      VITAL SIGNS: ED Triage Vitals  Enc Vitals Group     BP 10/13/19 0401 126/89     Pulse Rate 10/13/19 0401 94     Resp 10/13/19 0401 18     Temp 10/13/19 0401 97.9 F (36.6 C)     Temp Source 10/13/19 0401 Axillary     SpO2 10/13/19 0401 98 %     Weight 10/13/19 0402 74 lb 15.3 oz (34 kg)     Height 10/13/19 0402 5' (1.524 m)     Head Circumference --      Peak Flow --      Pain Score --      Pain Loc --      Pain Edu? --      Excl. in GC? --      Physical Exam Vitals and nursing note  reviewed.  Constitutional:      General: She is not in acute distress.    Appearance: She is well-developed.  HENT:     Head: Normocephalic and atraumatic.  Eyes:     Conjunctiva/sclera: Conjunctivae normal.  Cardiovascular:     Rate and Rhythm: Normal rate and regular rhythm.     Heart sounds: Normal heart sounds.  Pulmonary:     Effort: Pulmonary effort is normal. No respiratory distress.     Breath sounds: No wheezing.  Abdominal:     General: There is no distension.  Musculoskeletal:     Cervical back: Neck supple.  Skin:    General: Skin is warm.     Capillary Refill: Capillary refill takes less than 2 seconds.     Findings: No rash.  Neurological:     Mental Status: She is alert.     Motor: No abnormal muscle tone.     Comments: Non-verbal. Contractures b/l UE and LE with marked muscle wasting. No seizure like activity. Responds/orients to examiner.       ____________________________________________   LABS (all labs ordered are listed, but only abnormal results are displayed)  Labs Reviewed  CBC WITH DIFFERENTIAL/PLATELET - Abnormal; Notable for the following components:      Result Value   RDW 16.5 (*)    Platelets 90 (*)    All other components within normal limits  COMPREHENSIVE METABOLIC PANEL - Abnormal; Notable for the following components:   Glucose, Bld 112 (*)    BUN 23 (*)    All other components within normal limits  URINALYSIS, COMPLETE (UACMP) WITH MICROSCOPIC - Abnormal; Notable for the following components:   Color, Urine YELLOW (*)    APPearance CLOUDY (*)    Hgb urine dipstick LARGE (*)    Leukocytes,Ua TRACE (*)    RBC / HPF >50 (*)    All other components within normal limits  URINE CULTURE    ____________________________________________  EKG: None ________________________________________  RADIOLOGY All imaging, including plain films, CT scans, and ultrasounds, independently reviewed by me, and interpretations confirmed via formal  radiology reads.  ED MD interpretation:   CT Head: NAICA CXR: Clear, no focal PNA  Official radiology report(s): CT Head Wo Contrast  Result Date: 10/13/2019 CLINICAL DATA:  Encephalopathy. EXAM: CT HEAD WITHOUT CONTRAST TECHNIQUE: Contiguous axial images were obtained from the base of the skull through the vertex without intravenous contrast. COMPARISON:  01/21/2018 FINDINGS: Brain: No evidence of acute infarction, hemorrhage, hydrocephalus, or mass lesion/mass effect. Asymmetric prominence of right frontal CSF without convincing cortical mass effect. Vascular: No hyperdense  vessel or unexpected calcification. Skull: Normal. Negative for fracture or focal lesion. Sinuses/Orbits: No acute finding. IMPRESSION: No acute finding or change from 2019. Electronically Signed   By: Marnee Spring M.D.   On: 10/13/2019 04:50   DG Chest Portable 1 View  Result Date: 10/13/2019 CLINICAL DATA:  Altered mental status EXAM: PORTABLE CHEST 1 VIEW COMPARISON:  01/21/2018 FINDINGS: Normal heart size and mediastinal contours. Interval but remote right mid clavicle fracture. Asymmetric density at the right base is attributed to a broad, dysmorphic rib. There is no edema, consolidation, effusion, or pneumothorax. Scoliosis with fusion hardware. Gracile bones. IMPRESSION: No active disease. Electronically Signed   By: Marnee Spring M.D.   On: 10/13/2019 04:53    ____________________________________________  PROCEDURES   Procedure(s) performed (including Critical Care):  Procedures  ____________________________________________  INITIAL IMPRESSION / MDM / ASSESSMENT AND PLAN / ED COURSE  As part of my medical decision making, I reviewed the following data within the electronic MEDICAL RECORD NUMBER Nursing notes reviewed and incorporated, Old chart reviewed, Notes from prior ED visits, and [place] Controlled Substance Database       *GISELA LEA was evaluated in Emergency Department on 10/13/2019 for the  symptoms described in the history of present illness. She was evaluated in the context of the global COVID-19 pandemic, which necessitated consideration that the patient might be at risk for infection with the SARS-CoV-2 virus that causes COVID-19. Institutional protocols and algorithms that pertain to the evaluation of patients at risk for COVID-19 are in a state of rapid change based on information released by regulatory bodies including the CDC and federal and state organizations. These policies and algorithms were followed during the patient's care in the ED.  Some ED evaluations and interventions may be delayed as a result of limited staffing during the pandemic.*     Medical Decision Making:  44 yo F here with questionable seizure like activity, AMS. H/o recurrent seizures per records. On arrival here, pt is afebrile, well appearing and in NAD with no seizure like activity. Labs overall unremarkable. Normal WBC, renal function at baseline. Plt 90 - she has a h/o pancytopenia, no signs of active bleeding or petechiae, can be followed up as outpt. CT Head negative, and CXR is clear. Plan to follow-up UA, likely discharge home.  UA with pyuria, hematuria. Pt given fluids, rocephin here and will d/c with ABX given her h/o UTIs and reported breakthrough seizures. Home seizure meds given here. Father updated.  ____________________________________________  FINAL CLINICAL IMPRESSION(S) / ED DIAGNOSES  Final diagnoses:  Disorientation  Lower urinary tract infectious disease     MEDICATIONS GIVEN DURING THIS VISIT:  Medications  LORazepam (ATIVAN) tablet 1 mg (1 mg Oral Given 10/13/19 0743)  Oxcarbazepine (TRILEPTAL) tablet 600 mg (600 mg Oral Given 10/13/19 0743)  baclofen (LIORESAL) tablet 10 mg (10 mg Oral Given 10/13/19 0743)  cefTRIAXone (ROCEPHIN) 1 g in sodium chloride 0.9 % 100 mL IVPB (0 g Intravenous Stopped 10/13/19 0858)  sodium chloride 0.9 % bolus 1,000 mL (0 mLs Intravenous Stopped 10/13/19  0857)     ED Discharge Orders         Ordered    cephALEXin (KEFLEX) 500 MG capsule  3 times daily     Discontinue  Reprint     10/13/19 0727           Note:  This document was prepared using Dragon voice recognition software and may include unintentional dictation errors.   Shaune Pollack, MD  10/13/19 2111  

## 2019-10-13 NOTE — Discharge Instructions (Addendum)
Take the antibiotic as prescribed for possible UTI  Continue your usual seizure medications  Try to encourage Alicia Duncan to drink as much fluid as able

## 2019-10-13 NOTE — ED Notes (Signed)
Pt transported to Ct.

## 2019-10-13 NOTE — ED Notes (Signed)
Pt was unable to sign transfer d/t mental capacity

## 2019-10-13 NOTE — ED Notes (Signed)
Pt father notified of pt ready for discharge  Pt will need to be transported by EMS  Secretary notified to contact EMS for transport home

## 2019-10-13 NOTE — ED Triage Notes (Signed)
Pt arrives [facility] from home. Pt father reports finding pt stiff with arms flailing when rolling. Father reports pt has been more agitated and AMS. Upon EMS arrival pt was agitated in bed. Pt nonambulatory, hx spinal fusions and seizures.  132/84 BS 114

## 2019-10-14 LAB — URINE CULTURE: Culture: <10000 — AB

## 2019-10-15 ENCOUNTER — Other Ambulatory Visit: Payer: Self-pay | Admitting: Neurology

## 2019-10-19 ENCOUNTER — Other Ambulatory Visit: Payer: Self-pay | Admitting: Neurology

## 2019-10-19 NOTE — Telephone Encounter (Signed)
Called back and spoke with pt father. Relayed SS,NP message. He wrote down that she should be taking tiagabine 4mg  po BID for the next month. Read back directions correctly x3. Advised that if she continues to have seizures, he should contact our office and dose will be increased to TID. Provided office #. He is aware new prescription was sent to [id number] , [place] - [address] He will reach out to them if he does not hear from them.  FYI-Father states she has a [address] that comes to stay with her, Comptroller.

## 2019-10-19 NOTE — Telephone Encounter (Signed)
Called and spoke with pt father, he states pt unable to discuss things and he helps her (on [place]). He looked at pt med bottles and states she has a bottle of tiagabine 2mg  po TID that was filled 04/29/19 and prescribed by Dr. 05/01/19 (pt PCP). Advised there is some confusion because refill request that we received is for 4mg  po BID and last OV note of Dr. Burnett Sheng states that she takes 2mg  po BID. He was able to find another bottle of tiagabine 4mg  tablet, 1 tablet po TID. This was filled 09/14/2019. Has 0 refills. Prescribed by S. Slack. Advised I will send a message to the NP to try and clear up what dose she should be taking. We will call back if any further information is needed. He verbalized understanding.

## 2019-10-19 NOTE — Telephone Encounter (Signed)
I have on file the following adjustments for gabitril:  09/22/2018 visit with Dr. Anne Hahn: Gabitril was increased to 4 mg twice daily, noted still quite low dose, the maximum dose if 56 mg daily  03/16/2019: I saw the patient, Gabitril was increased to 4 mg 3 times daily.  09/14/2019: Dr. Anne Hahn saw the patient, there was no specific dosing mention, just "low dose".  I feel the patient is to be taking Gabitril 4 mg 3 times daily. The dose has been increased over time due to seizures.   We don't know how much she is taking, I will send in 4 mg twice daily, for 1 month, if she continues having seizure activity, then we can go up on the dose 4 mg 3 times day. He just needs to let us know, according to the nurse who took the call, her father is a limited historian on her medications, I don't want to increase too rapidly.

## 2019-11-10 ENCOUNTER — Other Ambulatory Visit: Payer: Self-pay | Admitting: Neurology

## 2019-11-14 ENCOUNTER — Emergency Department
Admission: EM | Admit: 2019-11-14 | Discharge: 2019-11-17 | Disposition: A | Discharge status: Home / Self Care | Payer: Medicare Other | Attending: Emergency Medicine | Admitting: Emergency Medicine

## 2019-11-14 ENCOUNTER — Other Ambulatory Visit: Payer: Self-pay

## 2019-11-14 DIAGNOSIS — R569 Unspecified convulsions: Secondary | ICD-10-CM | POA: Insufficient documentation

## 2019-11-14 LAB — URINALYSIS, COMPLETE (UACMP) WITH MICROSCOPIC
Bilirubin Urine: NEGATIVE
Glucose, UA: >=500 mg/dL — AB
Hgb urine dipstick: NEGATIVE
Ketones, ur: NEGATIVE mg/dL
Leukocytes,Ua: NEGATIVE
Nitrite: NEGATIVE
Protein, ur: NEGATIVE mg/dL
Specific Gravity, Urine: 1.024 (ref 1.005–1.030)
pH: 7 (ref 5.0–8.0)

## 2019-11-14 LAB — COMPREHENSIVE METABOLIC PANEL
ALT: 27 U/L (ref 0–44)
AST: 47 U/L — ABNORMAL HIGH (ref 15–41)
Albumin: 3.9 g/dL (ref 3.5–5.0)
Alkaline Phosphatase: 50 U/L (ref 38–126)
Anion gap: 8 (ref 5–15)
BUN: 21 mg/dL — ABNORMAL HIGH (ref 6–20)
CO2: 20 mmol/L — ABNORMAL LOW (ref 22–32)
Calcium: 9 mg/dL (ref 8.9–10.3)
Chloride: 112 mmol/L — ABNORMAL HIGH (ref 98–111)
Creatinine, Ser: 0.61 mg/dL (ref 0.44–1.00)
GFR calc Af Amer: >60 mL/min (ref >60)
GFR calc non Af Amer: >60 mL/min (ref >60)
Glucose, Bld: 176 mg/dL — ABNORMAL HIGH (ref 70–99)
Potassium: 3.2 mmol/L — ABNORMAL LOW (ref 3.5–5.1)
Sodium: 140 mmol/L (ref 135–145)
Total Bilirubin: 0.6 mg/dL (ref 0.3–1.2)
Total Protein: 7.1 g/dL (ref 6.5–8.1)

## 2019-11-14 LAB — CBC
HCT: 38.6 % (ref 36.0–46.0)
Hemoglobin: 12.8 g/dL (ref 12.0–15.0)
MCH: 29.8 pg (ref 26.0–34.0)
MCHC: 33.2 g/dL (ref 30.0–36.0)
MCV: 89.8 fL (ref 80.0–100.0)
Platelets: 117 10*3/uL — ABNORMAL LOW (ref 150–400)
RBC: 4.3 MIL/uL (ref 3.87–5.11)
RDW: 16.6 % — ABNORMAL HIGH (ref 11.5–15.5)
WBC: 6.9 10*3/uL (ref 4.0–10.5)
nRBC: 0 % (ref 0.0–0.2)

## 2019-11-14 MED ORDER — LORAZEPAM 2 MG/ML IJ SOLN
INTRAMUSCULAR | Status: AC
  Administered 2019-11-14: 1 mg via INTRAVENOUS
  Filled 2019-11-14: qty 1

## 2019-11-14 MED ORDER — SODIUM CHLORIDE 0.9 % IV BOLUS
1000.0000 mL | Freq: Once | INTRAVENOUS | Status: AC
  Administered 2019-11-14: 1000 mL via INTRAVENOUS

## 2019-11-14 MED ORDER — LORAZEPAM 2 MG/ML IJ SOLN: 1.0000 mg | Freq: Once | INTRAMUSCULAR | Status: AC

## 2019-11-14 NOTE — ED Notes (Signed)
Pt attempting to get out of bed, sitter at bedside for safety

## 2019-11-14 NOTE — ED Notes (Signed)
Resumed care from bri rn.  Pt in hallway bed.  Pt waiting on call from legal guardian .  siderails up x 2.

## 2019-11-14 NOTE — ED Notes (Signed)
This RN contacted [employer] to send officer to address listed in effort to speak to father or legal guardian. Awaiting call back

## 2019-11-14 NOTE — ED Notes (Signed)
Attempted to contact father at home number listed, no answer, unable to leave VM, home number listed not in service

## 2019-11-14 NOTE — ED Triage Notes (Signed)
Pt to ED via [facility] from home with chief complaint of possible seizures LIMITED INFORMATION D/T PT NONVERBAL, FATHER WITH DEMENTIA AND BROTHER POOR HISTORIAN AT HOME EMS reports possible hx seizures where pt "throws herself off couch then back on couch" per family. Pt non verbal, unable to answer questions, does not follow commands. Pt to ED in dirty clothes and diaper. No family on arrival NAD noted. Pt appears to be having twiching movements to head and upper body. MD Paduchowski aware

## 2019-11-14 NOTE — ED Notes (Signed)
Attempted to contact brother at number listed, number not in service

## 2019-11-14 NOTE — ED Notes (Signed)
In and out performed with Jonny Ruiz NT Pt changed and peri care provided, soiled from urine

## 2019-11-14 NOTE — ED Notes (Signed)
  This RN contacted APS for concern for pt's care at home d/t patient needing full care, non verbal, AMS, unable to care for self and EMS reporting that father at home with dementia and brother mentally impaired at home, caregiver appeared to be only available during the day time. This RN feels pt needs 24/7 care.  PT arrived to ED in dirty clothes with soiled diaper.  Awaiting response from APS regarding screening

## 2019-11-14 NOTE — ED Notes (Signed)
Pt resting at this time, RR even and unlabored. Seizure pads in place

## 2019-11-14 NOTE — ED Notes (Signed)
Received call back from [employer] for DSS, reports will assess pt within 24 hours. This RN informed Fleet Contras that RN unable to get in touch with legal guardian or father at number listed and that police were sent to home in attempt to speak with father or legal guardian and that patient will not be discharged home until RN speaks with legal guardian

## 2019-11-14 NOTE — ED Provider Notes (Signed)
[name]  Time seen: 7:53 PM  I have reviewed the triage vital signs and the nursing notes.   HISTORY  Chief Complaint Seizures   HPI Alicia Duncan is a 44 y.o. female with a past medical history of seizure disorder, Rett syndrome, presents to the emergency department after a seizure at home.  According to EMS report patient lives at home with her father and brother as well as has a home health aide on a daily basis that leaves at 5 PM, had a seizure today approximately 45 minutes prior to EMS arrival.  Not entirely clear how long the seizure lasted.  Here the patient is awake, will occasionally look at you when you are talking to her but otherwise does not respond, is unclear her baseline mental status.  Record review shows patient is nonverbal at baseline.  Past Medical History:  Diagnosis Date  . Dystonia   . Rett syndrome   . Seizures [facility])     Patient Active Problem List   Diagnosis Date Noted  . Cough 05/13/2016  . Pancytopenia (HCC) 05/13/2016  . Iron deficiency anemia 05/13/2016  . SIRS (systemic inflammatory response syndrome) (HCC) 05/11/2016  . Dental infection 05/11/2016  . 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 Seizure disorder (HCC) 05/11/2016  . Sepsis (HCC) 05/11/2016  . Seizure (HCC) 04/25/2013    No past surgical history on file.  Prior to Admission medications   Medication Sig Start Date End Date Taking? Authorizing Provider  baclofen (LIORESAL) 10 MG tablet Take 10 mg by mouth 2 (two) times daily.     [provider]  feeding supplement, ENSURE ENLIVE, (ENSURE ENLIVE) LIQD Take 237 mLs by mouth 3 (three) times daily with meals. 05/13/16   Katharina Caper, MD  ferrous gluconate (FERGON) 324 MG tablet TAKE 1 TABLET BY MOUTH EVERY DAY WITH BREAKFAST 02/01/19   York Spaniel, MD  FLUoxetine (PROZAC) 10 MG tablet Take 10 mg by mouth daily.    [provider]  LORazepam (ATIVAN) 1 MG  tablet Take 1 tablet (1 mg total) by mouth 2 (two) times daily. 09/14/19   York Spaniel, MD  omeprazole (PRILOSEC) 20 MG capsule Take 20 mg by mouth daily.    [provider]  Oxcarbazepine (TRILEPTAL) 300 MG tablet TAKE 2 TABLETS (600 MG TOTAL) BY MOUTH 2 (TWO) TIMES DAILY. 11/10/19   York Spaniel, MD  polyethylene glycol powder [facility]) 17 GM/SCOOP powder 1 cap full in a full glass of water, two times a day for 3 days. 09/12/18   Sharman Cheek, MD  tiaGABine (GABITRIL) 4 MG tablet TAKE 1 TABLET (4 MG TOTAL) BY MOUTH 2 (TWO) TIMES DAILY. 10/19/19   Glean Salvo, NP    Allergies  Allergen Reactions  . Benadryl [Diphenhydramine]   . Penicillins Other (See Comments)    Has patient had a PCN reaction causing immediate rash, facial/tongue/throat swelling, SOB or lightheadedness with hypotension: Unknown Has patient had a PCN reaction causing severe rash involving mucus membranes or skin necrosis: Unknown Has patient had a PCN reaction that required hospitalization: Unknown Has patient had a PCN reaction occurring within the last 10 years: Unknown If all of the above answers are "NO", then may proceed with Cephalosporin use.     Family History  Problem Relation Age of Onset  . Seizures Mother   . Memory loss Father   . Diverticulosis Father   . Schizophrenia Brother     Social History  Social History   Tobacco Use  . Smoking status: Never Smoker  . Smokeless tobacco: Never Used  Substance Use Topics  . Alcohol use: No  . Drug use: No    Review of Systems Unable to obtain adequate/accurate review of systems secondary to nonverbal status ____________________________________________   PHYSICAL EXAM:  VITAL SIGNS: ED Triage Vitals  Enc Vitals Group     BP 11/14/19 1926 133/71     Pulse Rate 11/14/19 1926 95     Resp 11/14/19 1926 (!) 24     Temp 11/14/19 1926 97.8 F (36.6 C)     Temp Source 11/14/19 1926 Axillary     SpO2 11/14/19 1926 99 %      Weight 11/14/19 1927 80 lb (36.3 kg)     Height 11/14/19 1927 5' (1.524 m)     Head Circumference --      Peak Flow --      Pain Score --      Pain Loc --      Pain Edu? --      Excl. in GC? --     Constitutional: Patient is awake, will occasionally look at you when you are talking otherwise does not follow commands or answer questions.  Appears to be moving all extremities at times. Eyes: Normal exam ENT      Head: Normocephalic and atraumatic.      Mouth/Throat: Mucous membranes are moist. Cardiovascular: Normal rate, regular rhythm.  Respiratory: Slight tachypnea around 20 to 25 breaths/min.  Equal breath sounds bilateral without obvious wheeze rales or rhonchi. Gastrointestinal: Soft and nontender. No distention. Musculoskeletal: Mildly contracted extremities.  Appears to be nonambulatory. Neurologic: Patient does not speak, per record review patient is nonverbal at baseline.  Occasionally does move extremities. Skin:  Skin is warm, dry  Psychiatric: Mood and affect are normal.   ____________________________________________    EKG  EKG viewed and interpreted by myself shows a normal sinus rhythm 81 bpm with a narrow QRS, normal axis, normal intervals, no concerning ST changes.  Reassuring EKG.  ____________________________________________   INITIAL IMPRESSION / ASSESSMENT AND PLAN / ED COURSE  Pertinent labs & imaging results that were available during my care of the patient were reviewed by me and considered in my medical decision making (see chart for details).   Patient presents to the emergency department after a seizure at home.  Patient has a known seizure disorder, suffers from Rett syndrome, nonverbal at baseline.  Currently patient is awake and alert, no acute distress.  Will occasionally look at you when you are speaking otherwise does not, does not follow commands or answer questions but this is likely baseline for the patient.  Given a seizure at home we will  check labs, urinalysis, continue to closely monitor the patient.  Patient will occasionally make a twitching type movement but appears to be conscious throughout does not appear to be seizing.  Patient much more awake at this time attempting to climb out of bed.  We will dose 1 mg of Ativan to help the patient relax while awaiting lab results.  Labs are largely within normal limits.  No acute findings.  Patient is awake and appears to be at her likely baseline.  The nurse has spoken to DSS who will do a welfare check tomorrow.  Patient will be discharged home.  Alicia Duncan was evaluated in Emergency Department on 11/14/2019 for the symptoms described in the history of present illness. She was evaluated in the  context of the global COVID-19 pandemic, which necessitated consideration that the patient might be at risk for infection with the SARS-CoV-2 virus that causes COVID-19. Institutional protocols and algorithms that pertain to the evaluation of patients at risk for COVID-19 are in a state of rapid change based on information released by regulatory bodies including the CDC and federal and state organizations. These policies and algorithms were followed during the patient's care in the ED.  ____________________________________________   FINAL CLINICAL IMPRESSION(S) / ED DIAGNOSES  Seizure   Minna Antis, MD 11/14/19 2141

## 2019-11-14 NOTE — ED Notes (Signed)
Seizure pads in place

## 2019-11-15 NOTE — ED Notes (Addendum)
This pt set for discharge back to home but unable to reach legal guardian, Father/Alicia Duncan. Police that went to home seen no vehicles with no lights or answer of doorbell/knocking . This RN finally able to reach Alicia Duncan/Brother who lives with pt and father. Alicia Duncan reports that his fathers dementia has worsened over the last 6 months and that he has not been able to provide care to pt or to son/Alicia. Alicia Duncan has Schizophrenia and is disabled as well. Per Alicia Duncan, there is a home health nurse, from [place], that comes to the home daily from 10am-3pm. Concern for safety and for adequate care for both pt and brother at this point. Pt not discharged. Don Perking, MD to place social work consult. Previous RN, Colin Mulders reported APS/DSS. Pt has brother who lives in [place] with no mental disabilities, Alicia Duncan [phone].    Other Numbers for pts home where [employer] and Alicia live: [phone]

## 2019-11-15 NOTE — ED Notes (Signed)
Report off to vanessa rn 

## 2019-11-15 NOTE — TOC Progression Note (Addendum)
Transition of Care [facility]) - Progression Note    Patient Details  Name: Alicia Duncan MRN: [id number] Date of Birth: October 10, 1975  Transition of Care [facility]) CM/SW Contact  Marina Goodell Phone Number: [phone] 11/15/2019, 12:06 PM  Clinical Narrative:      CSW spoke with Diona Fanti [phone] DSS/APS, for status update.  [name] has been assigned to the case and will try to come and see the patient in the next 24-48 hours.  [name] will need to speak with the patient's legal guardian to determine whether a protective order will be necessary.      Expected Discharge Plan and Services                                                 Social Determinants of Health (SDOH) Interventions    Readmission Risk Interventions No flowsheet data found.

## 2019-11-15 NOTE — ED Notes (Signed)
Pt sleeping. 

## 2019-11-15 NOTE — ED Notes (Signed)
Pt is laying on bed sleeping. NAD noted. Pt waiting on social work placement to facility.

## 2019-11-15 NOTE — ED Notes (Signed)
Social worker at bedside with patient.  

## 2019-11-15 NOTE — ED Notes (Signed)
Pt with BM in brief. Pt cleaned, new brief in place. Linen changed as well. New warm blankets provided.

## 2019-11-15 NOTE — ED Notes (Signed)
Pt sleeping in hallway bed.

## 2019-11-16 DIAGNOSIS — R569 Unspecified convulsions: Secondary | ICD-10-CM | POA: Diagnosis not present

## 2019-11-16 MED ORDER — FLUOXETINE HCL 10 MG PO CAPS
10.0000 mg | ORAL_CAPSULE | Freq: Every day | ORAL | Status: DC
  Administered 2019-11-16 – 2019-11-17 (×2): 10 mg via ORAL
  Filled 2019-11-16 (×2): qty 1

## 2019-11-16 MED ORDER — BACLOFEN 10 MG PO TABS
10.0000 mg | ORAL_TABLET | Freq: Two times a day (BID) | ORAL | Status: DC
  Administered 2019-11-16 – 2019-11-17 (×3): 10 mg via ORAL
  Filled 2019-11-16 (×4): qty 1

## 2019-11-16 MED ORDER — LORAZEPAM 1 MG PO TABS
1.0000 mg | ORAL_TABLET | Freq: Two times a day (BID) | ORAL | Status: DC
  Administered 2019-11-16 – 2019-11-17 (×3): 1 mg via ORAL
  Filled 2019-11-16 (×3): qty 1

## 2019-11-16 MED ORDER — OXCARBAZEPINE 300 MG PO TABS
600.0000 mg | ORAL_TABLET | Freq: Two times a day (BID) | ORAL | Status: DC
  Administered 2019-11-16 – 2019-11-17 (×3): 600 mg via ORAL
  Filled 2019-11-16 (×3): qty 2

## 2019-11-16 MED ORDER — TIAGABINE HCL 4 MG PO TABS
2.0000 mg | ORAL_TABLET | Freq: Three times a day (TID) | ORAL | Status: DC
  Filled 2019-11-16 (×5): qty 1

## 2019-11-16 MED ORDER — PANTOPRAZOLE SODIUM 40 MG PO TBEC
40.0000 mg | DELAYED_RELEASE_TABLET | Freq: Every day | ORAL | Status: DC
  Administered 2019-11-16 – 2019-11-17 (×2): 40 mg via ORAL
  Filled 2019-11-16 (×2): qty 1

## 2019-11-16 MED ORDER — ENSURE ENLIVE PO LIQD
237.0000 mL | Freq: Three times a day (TID) | ORAL | Status: DC
  Administered 2019-11-16 (×2): 237 mL via ORAL

## 2019-11-16 NOTE — TOC Progression Note (Addendum)
Transition of Care [facility]) - Progression Note    Patient Details  Name: Alicia Duncan MRN: [id number] Date of Birth: 06/25/1975  Transition of Care [facility]) CM/SW Contact  Joseph Art, [place] Phone Number: 11/16/2019, 9:49 AM  Clinical Narrative:     CSW left voicemail with Diona Fanti DSS/APS [phone] for status update on patient DSS case. [name]' supervisor is Erasmo Downer DSS/APS [phone]  11:00AM : CSW spoke with [name] DSS/APS, she stated she was going to contact patient's brother in [place], and call this CSW once she had more information.      Expected Discharge Plan and Services                                                 Social Determinants of Health (SDOH) Interventions    Readmission Risk Interventions No flowsheet data found.

## 2019-11-16 NOTE — ED Notes (Signed)
Pt cleaned of wet brief. New brief applied and chuck in place.

## 2019-11-16 NOTE — ED Notes (Signed)
Pt dinner tray arrived at this time and this RN assisted patient with eating. Patient ate all of magic cup, all of thicken tea, and this RN encouraging patient to drink ensure at this time.

## 2019-11-16 NOTE — ED Notes (Signed)
Pt brief changed at this time. Patient clean/dry at this time, no further needs ntoed.

## 2019-11-16 NOTE — ED Notes (Signed)
Pt given lunch tray at this time and this RN attempted to feed her. Patient refused food at this time, will continue to monitor and attempt to feed patient. Pt did have several bites of apple sauce with medications.

## 2019-11-16 NOTE — ED Notes (Signed)
Pts brief changed. Hospital gown provided. Pt repositioned.

## 2019-11-16 NOTE — ED Notes (Signed)
Pt alert, resting in bed. Covered pt up w/ blanket.

## 2019-11-16 NOTE — ED Provider Notes (Signed)
Noted patient not on home meds, pharmacy tech reconciling and will order her home medications.  Transition of care team continue to work to find appropriate disposition.  Meds reconciled through patient's pharmacy via our pharmacy technician.   Sharyn Creamer, MD 11/16/19 1146

## 2019-11-16 NOTE — ED Notes (Signed)
Pt provided w/ breakfast tray, this RN assisted w/ feeding. Pt ate 5+ bites of magic cup vanilla and 5+ bites of grits. Pt offered more bites and pt pushed RN's hand away.

## 2019-11-17 DIAGNOSIS — R569 Unspecified convulsions: Secondary | ICD-10-CM | POA: Diagnosis not present

## 2019-11-17 LAB — GLUCOSE, CAPILLARY: Glucose-Capillary: 77 mg/dL (ref 70–99)

## 2019-11-17 MED ORDER — TIAGABINE HCL 2 MG PO TABS
2.0000 mg | ORAL_TABLET | Freq: Three times a day (TID) | ORAL | Status: DC
  Administered 2019-11-17: 2 mg via ORAL
  Filled 2019-11-17 (×6): qty 1

## 2019-11-17 NOTE — TOC Transition Note (Signed)
Transition of Care [facility]) - CM/SW Discharge Note   Patient Details  Name: Alicia Duncan MRN: [id number] Date of Birth: 1975-08-10  Transition of Care [facility]) CM/SW Contact:  Marina Goodell Phone Number: [phone] 11/17/2019, 1:43 PM   Clinical Narrative:     CSW spoke with Diona Fanti APS/DSS [phone], for status update.  Patient will d/c home with additional home health aid services (16 hours a day).  [employer] Amy Sibyl Parr [phone], will monitor home health aide.  [name], DSS will take guardian paperwork to Bertell Maria, brother who has agreed to be the patient's guardian.  [name]' stated "everyone I've talked to says she has excellent care at home, but dad's dementia is getting worse."  CSW updated EDP/ED Staff on patient disposition.        Patient Goals and CMS Choice        Discharge Placement                       Discharge Plan and Services                                     Social Determinants of Health (SDOH) Interventions     Readmission Risk Interventions No flowsheet data found.

## 2019-11-17 NOTE — ED Provider Notes (Signed)
Patient has been cleared by DSS for discharge home.  She remains medically cleared.   Willy Eddy, MD 11/17/19 1350

## 2019-11-17 NOTE — ED Notes (Addendum)
Pt unable to sign for discharge d/t condition. Pt d/c with [facility] home. Pt's family unreachable by phone at this time but family are aware of pt's pending discharge home from previous two phone calls.

## 2019-11-17 NOTE — ED Notes (Signed)
Pt repositioned in bed. Pt dry at this time.

## 2019-11-17 NOTE — ED Notes (Signed)
Pt incontinent of urine. Pt changed and repositioned by this RN and Scott EDT.

## 2019-11-17 NOTE — ED Notes (Signed)
Pt's father at bedside, assisted with feeding patient breakfast.

## 2019-11-17 NOTE — ED Notes (Signed)
Legal guardian- Sayda Grable contacted about patient being discharged home today. Pt will be transported via [facility].

## 2019-11-28 ENCOUNTER — Telehealth: Payer: Self-pay | Admitting: Dietician

## 2019-11-28 NOTE — Telephone Encounter (Signed)
Returned phone message from patient's father Shivon Hackel, regarding request for nutrition information and/or visit. Unable to leave message; voicemail full.

## 2019-12-03 ENCOUNTER — Other Ambulatory Visit: Payer: Self-pay | Admitting: Neurology

## 2019-12-17 ENCOUNTER — Emergency Department: Payer: Medicare Other

## 2019-12-17 ENCOUNTER — Encounter: Payer: Self-pay | Admitting: Emergency Medicine

## 2019-12-17 ENCOUNTER — Other Ambulatory Visit: Payer: Self-pay

## 2019-12-17 ENCOUNTER — Inpatient Hospital Stay
Admission: EM | Admit: 2019-12-17 | Discharge: 2019-12-22 | DRG: 100 | Disposition: A | Discharge status: Home Health | Payer: Medicare Other | Attending: Internal Medicine | Admitting: Internal Medicine

## 2019-12-17 DIAGNOSIS — R569 Unspecified convulsions: Secondary | ICD-10-CM

## 2019-12-17 DIAGNOSIS — G40909 Epilepsy, unspecified, not intractable, without status epilepticus: Principal | ICD-10-CM | POA: Diagnosis present

## 2019-12-17 DIAGNOSIS — G249 Dystonia, unspecified: Secondary | ICD-10-CM | POA: Diagnosis present

## 2019-12-17 DIAGNOSIS — Z79899 Other long term (current) drug therapy: Secondary | ICD-10-CM

## 2019-12-17 DIAGNOSIS — Z20822 Contact with and (suspected) exposure to covid-19: Secondary | ICD-10-CM | POA: Diagnosis present

## 2019-12-17 DIAGNOSIS — Z681 Body mass index (BMI) 19 or less, adult: Secondary | ICD-10-CM

## 2019-12-17 DIAGNOSIS — Z9114 Patient's other noncompliance with medication regimen: Secondary | ICD-10-CM

## 2019-12-17 DIAGNOSIS — G40919 Epilepsy, unspecified, intractable, without status epilepticus: Secondary | ICD-10-CM | POA: Diagnosis present

## 2019-12-17 DIAGNOSIS — F842 Rett's syndrome: Secondary | ICD-10-CM | POA: Diagnosis present

## 2019-12-17 DIAGNOSIS — E43 Unspecified severe protein-calorie malnutrition: Secondary | ICD-10-CM | POA: Insufficient documentation

## 2019-12-17 DIAGNOSIS — D696 Thrombocytopenia, unspecified: Secondary | ICD-10-CM | POA: Diagnosis present

## 2019-12-17 DIAGNOSIS — R1312 Dysphagia, oropharyngeal phase: Secondary | ICD-10-CM

## 2019-12-17 LAB — CBC WITH DIFFERENTIAL/PLATELET
Abs Immature Granulocytes: 0.02 10*3/uL (ref 0.00–0.07)
Basophils Absolute: 0.1 10*3/uL (ref 0.0–0.1)
Basophils Relative: 1 %
Eosinophils Absolute: 0.1 10*3/uL (ref 0.0–0.5)
Eosinophils Relative: 1 %
HCT: 39 % (ref 36.0–46.0)
Hemoglobin: 12.8 g/dL (ref 12.0–15.0)
Immature Granulocytes: 0 %
Lymphocytes Relative: 23 %
Lymphs Abs: 1.9 10*3/uL (ref 0.7–4.0)
MCH: 29 pg (ref 26.0–34.0)
MCHC: 32.8 g/dL (ref 30.0–36.0)
MCV: 88.2 fL (ref 80.0–100.0)
Monocytes Absolute: 0.7 10*3/uL (ref 0.1–1.0)
Monocytes Relative: 8 %
Neutro Abs: 5.8 10*3/uL (ref 1.7–7.7)
Neutrophils Relative %: 67 %
Platelets: 170 10*3/uL (ref 150–400)
RBC: 4.42 MIL/uL (ref 3.87–5.11)
RDW: 15 % (ref 11.5–15.5)
WBC: 8.6 10*3/uL (ref 4.0–10.5)
nRBC: 0 % (ref 0.0–0.2)

## 2019-12-17 LAB — COMPREHENSIVE METABOLIC PANEL
ALT: 28 U/L (ref 0–44)
AST: 38 U/L (ref 15–41)
Albumin: 3.9 g/dL (ref 3.5–5.0)
Alkaline Phosphatase: 58 U/L (ref 38–126)
Anion gap: 10 (ref 5–15)
BUN: 14 mg/dL (ref 6–20)
CO2: 20 mmol/L — ABNORMAL LOW (ref 22–32)
Calcium: 9.2 mg/dL (ref 8.9–10.3)
Chloride: 109 mmol/L (ref 98–111)
Creatinine, Ser: 0.52 mg/dL (ref 0.44–1.00)
GFR calc Af Amer: >60 mL/min (ref >60)
GFR calc non Af Amer: >60 mL/min (ref >60)
Glucose, Bld: 159 mg/dL — ABNORMAL HIGH (ref 70–99)
Potassium: 3.5 mmol/L (ref 3.5–5.1)
Sodium: 139 mmol/L (ref 135–145)
Total Bilirubin: 0.5 mg/dL (ref 0.3–1.2)
Total Protein: 7.4 g/dL (ref 6.5–8.1)

## 2019-12-17 LAB — LACTIC ACID, PLASMA
Lactic Acid, Venous: 3.5 mmol/L (ref 0.5–1.9)
Lactic Acid, Venous: 1 mmol/L (ref 0.5–1.9)

## 2019-12-17 MED ORDER — ONDANSETRON HCL 4 MG PO TABS: 4.0000 mg | ORAL_TABLET | Freq: Four times a day (QID) | ORAL | Status: DC | PRN

## 2019-12-17 MED ORDER — LEVETIRACETAM IN NACL 500 MG/100ML IV SOLN
500.0000 mg | Freq: Two times a day (BID) | INTRAVENOUS | Status: DC
  Administered 2019-12-18 – 2019-12-22 (×9): 500 mg via INTRAVENOUS
  Filled 2019-12-17 (×13): qty 100

## 2019-12-17 MED ORDER — LORAZEPAM 2 MG/ML IJ SOLN: 1.0000 mg | Freq: Once | INTRAMUSCULAR | Status: AC

## 2019-12-17 MED ORDER — ENOXAPARIN SODIUM 40 MG/0.4ML ~~LOC~~ SOLN
40.0000 mg | SUBCUTANEOUS | Status: DC
  Administered 2019-12-17: 40 mg via SUBCUTANEOUS
  Filled 2019-12-17: qty 0.4

## 2019-12-17 MED ORDER — DEXTROSE-NACL 5-0.9 % IV SOLN: INTRAVENOUS | Status: DC

## 2019-12-17 MED ORDER — ACETAMINOPHEN 325 MG PO TABS: 650.0000 mg | ORAL_TABLET | Freq: Four times a day (QID) | ORAL | Status: DC | PRN

## 2019-12-17 MED ORDER — LORAZEPAM 2 MG/ML IJ SOLN
INTRAMUSCULAR | Status: AC
  Administered 2019-12-17: 1 mg via INTRAVENOUS
  Filled 2019-12-17: qty 1

## 2019-12-17 MED ORDER — ACETAMINOPHEN 650 MG RE SUPP: 650.0000 mg | Freq: Four times a day (QID) | RECTAL | Status: DC | PRN

## 2019-12-17 MED ORDER — ONDANSETRON HCL 4 MG/2ML IJ SOLN
4.0000 mg | Freq: Four times a day (QID) | INTRAMUSCULAR | Status: DC | PRN
  Administered 2019-12-22: 4 mg via INTRAVENOUS

## 2019-12-17 MED ORDER — LEVETIRACETAM IN NACL 1000 MG/100ML IV SOLN
1000.0000 mg | Freq: Once | INTRAVENOUS | Status: AC
  Administered 2019-12-17: 1000 mg via INTRAVENOUS
  Filled 2019-12-17: qty 100

## 2019-12-17 NOTE — ED Provider Notes (Signed)
[facility] Emergency Department Provider Note    None    (approximate)  I have reviewed the triage vital signs and the nursing notes.   HISTORY  Chief Complaint Seizures  Level v Caveat:  ams - seizure  HPI Alicia Duncan is a 44 y.o. female the below listed past medical history presents to the ER for "lightening bolt seizure ".  Patient lives at home with her caregiver who does have dementia.  She has known history of seizures.  On arrival to the ER witnessed to have another brief seizure.  She did desaturate in the low 50s but responded on room air shortly thereafter.  She is given Ativan as well as IV Keppra.  Patient unable to provide any additional history.    Past Medical History:  Diagnosis Date  . Dystonia   . Rett syndrome   . Seizures (HCC)    Family History  Problem Relation Age of Onset  . Seizures Mother   . Memory loss Father   . Diverticulosis Father   . Schizophrenia Brother    No past surgical history on file. Patient Active Problem List   Diagnosis Date Noted  . Cough 05/13/2016  . Pancytopenia (HCC) 05/13/2016  . Iron deficiency anemia 05/13/2016  . SIRS (systemic inflammatory response syndrome) (HCC) 05/11/2016  . Dental infection 05/11/2016  . 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 Seizure disorder (HCC) 05/11/2016  . Sepsis (HCC) 05/11/2016  . Seizure (HCC) 04/25/2013      Prior to Admission medications   Medication Sig Start Date End Date Taking? Authorizing Provider  baclofen (LIORESAL) 10 MG tablet Take 10 mg by mouth 2 (two) times daily.     [provider]  feeding supplement, ENSURE ENLIVE, (ENSURE ENLIVE) LIQD Take 237 mLs by mouth 3 (three) times daily with meals. 05/13/16   Katharina Caper, MD  FLUoxetine (PROZAC) 10 MG capsule Take 10 mg by mouth daily. 10/28/19   [provider]  LORazepam (ATIVAN) 1 MG tablet Take 1 tablet (1 mg total) by mouth 2 (two) times daily. 09/14/19   York Spaniel, MD    omeprazole (PRILOSEC) 20 MG capsule Take 20 mg by mouth daily.    [provider]  Oxcarbazepine (TRILEPTAL) 300 MG tablet TAKE 2 TABLETS (600 MG TOTAL) BY MOUTH 2 (TWO) TIMES DAILY. 11/10/19   York Spaniel, MD  tiaGABine (GABITRIL) 2 MG tablet Take 2 mg by mouth 3 (three) times daily. 10/28/19   [provider]    Allergies Benadryl [diphenhydramine] and Penicillins    Social History Social History   Tobacco Use  . Smoking status: Never Smoker  . Smokeless tobacco: Never Used  Substance Use Topics  . Alcohol use: No  . Drug use: No    Review of Systems Patient denies headaches, rhinorrhea, blurry vision, numbness, shortness of breath, chest pain, edema, cough, abdominal pain, nausea, vomiting, diarrhea, dysuria, fevers, rashes or hallucinations unless otherwise stated above in HPI. ____________________________________________   PHYSICAL EXAM:  VITAL SIGNS: Vitals:   12/17/19 1706  BP: (!) 127/44  Pulse: 93  Resp: 18  Temp: 98.7 F (37.1 C)  SpO2: 96%    Constitutional: Alert, chronically ill appearing.  Eyes: Conjunctivae are normal.  Head: Atraumatic. Nose: No congestion/rhinnorhea. Mouth/Throat: Mucous membranes are moist.   Neck: No stridor. Painless ROM.  Cardiovascular: Normal rate, regular rhythm. Grossly normal heart sounds.  Good peripheral circulation. Respiratory: Normal respiratory effort.  No retractions.  Gastrointestinal: Soft and nontender.  No distention. No abdominal bruits. No CVA tenderness. Genitourinary: deferred Musculoskeletal: chronic contractures Neurologic:  No new gross focal neurologic deficits are appreciated. No facial droop Skin:  Skin is warm, dry and intact. No rash noted. Psychiatric: unable to assess  ____________________________________________   LABS (all labs ordered are listed, but only abnormal results are displayed)  Results for orders placed or performed during the hospital encounter of 12/17/19  (from the past 24 hour(s))  CBC with Differential/Platelet     Status: None   Collection Time: 12/17/19  5:28 PM  Result Value Ref Range   WBC 8.6 4.0 - 10.5 K/uL   RBC 4.42 3.87 - 5.11 MIL/uL   Hemoglobin 12.8 12.0 - 15.0 g/dL   HCT 01.7 36 - 46 %   MCV 88.2 80.0 - 100.0 fL   MCH 29.0 26.0 - 34.0 pg   MCHC 32.8 30.0 - 36.0 g/dL   RDW 51.0 25.8 - 52.7 %   Platelets 170 150 - 400 K/uL   nRBC 0.0 0.0 - 0.2 %   Neutrophils Relative % 67 %   Neutro Abs 5.8 1.7 - 7.7 K/uL   Lymphocytes Relative 23 %   Lymphs Abs 1.9 0.7 - 4.0 K/uL   Monocytes Relative 8 %   Monocytes Absolute 0.7 0 - 1 K/uL   Eosinophils Relative 1 %   Eosinophils Absolute 0.1 0 - 0 K/uL   Basophils Relative 1 %   Basophils Absolute 0.1 0 - 0 K/uL   Immature Granulocytes 0 %   Abs Immature Granulocytes 0.02 0.00 - 0.07 K/uL  Comprehensive metabolic panel     Status: Abnormal   Collection Time: 12/17/19  5:28 PM  Result Value Ref Range   Sodium 139 135 - 145 mmol/L   Potassium 3.5 3.5 - 5.1 mmol/L   Chloride 109 98 - 111 mmol/L   CO2 20 (L) 22 - 32 mmol/L   Glucose, Bld 159 (H) 70 - 99 mg/dL   BUN 14 6 - 20 mg/dL   Creatinine, Ser 7.82 0.44 - 1.00 mg/dL   Calcium 9.2 8.9 - 42.3 mg/dL   Total Protein 7.4 6.5 - 8.1 g/dL   Albumin 3.9 3.5 - 5.0 g/dL   AST 38 15 - 41 U/L   ALT 28 0 - 44 U/L   Alkaline Phosphatase 58 38 - 126 U/L   Total Bilirubin 0.5 0.3 - 1.2 mg/dL   GFR calc non Af Amer >60 >60 mL/min   GFR calc Af Amer >60 >60 mL/min   Anion gap 10 5 - 15  Lactic acid, plasma     Status: Abnormal   Collection Time: 12/17/19  5:28 PM  Result Value Ref Range   Lactic Acid, Venous 3.5 (HH) 0.5 - 1.9 mmol/L   ____________________________________________  EKG My review and personal interpretation at Time: 17:20   Indication: seizure  Rate: 95  Rhythm: sinus Axis: normal Other: no stemi, normal intervals,  ____________________________________________  RADIOLOGY  I personally reviewed all radiographic  images ordered to evaluate for the above acute complaints and reviewed radiology reports and findings.  These findings were personally discussed with the patient.  Please see medical record for radiology report.  ____________________________________________   PROCEDURES  Procedure(s) performed:  .Critical Care Performed by: Willy Eddy, MD Authorized by: Willy Eddy, MD   Critical care provider statement:    Critical care time (minutes):  35   Critical care time was exclusive of:  Separately billable procedures and treating other patients  Critical care was necessary to treat or prevent imminent or life-threatening deterioration of the following conditions:  CNS failure or compromise   Critical care was time spent personally by me on the following activities:  Development of treatment plan with patient or surrogate, discussions with consultants, evaluation of patient's response to treatment, examination of patient, obtaining history from patient or surrogate, ordering and performing treatments and interventions, ordering and review of laboratory studies, ordering and review of radiographic studies, pulse oximetry, re-evaluation of patient's condition and review of old charts      Critical Care performed: yes ____________________________________________   INITIAL IMPRESSION / ASSESSMENT AND PLAN / ED COURSE  Pertinent labs & imaging results that were available during my care of the patient were reviewed by me and considered in my medical decision making (see chart for details).   DDX: seizure, noncompliance, statusDehydration, sepsis, pna, uti, hypoglycemia, cva, drug effect, withdrawal, encephalitis   SETH FRIEDLANDER is a 44 y.o. who presents to the ED with multiple witnessed seizure-like episodes with hypoxia that resolved.  Blood work is without significant electrolyte abnormality does have lactic acidosis likely secondary to these seizure episodes.  In between dosing  that she goes back to her baseline but she is unable to provide much additional history.  CT imaging is reassuring.  Given these multiple episodes discussed case with hospitalist for admission neuro consult.  She was given IV Ativan as well as IV Keppra and that did seem to abate her episodes.  We will continue to monitor on telemetry.     The patient was evaluated in Emergency Department today for the symptoms described in the history of present illness. He/she was evaluated in the context of the global COVID-19 pandemic, which necessitated consideration that the patient might be at risk for infection with the SARS-CoV-2 virus that causes COVID-19. Institutional protocols and algorithms that pertain to the evaluation of patients at risk for COVID-19 are in a state of rapid change based on information released by regulatory bodies including the CDC and federal and state organizations. These policies and algorithms were followed during the patient's care in the ED.  As part of my medical decision making, I reviewed the following data within the electronic MEDICAL RECORD NUMBER Nursing notes reviewed and incorporated, Labs reviewed, notes from prior ED visits and [place] Controlled Substance Database   ____________________________________________   FINAL CLINICAL IMPRESSION(S) / ED DIAGNOSES  Final diagnoses:  Seizure-like activity (HCC)      NEW MEDICATIONS STARTED DURING THIS VISIT:  New Prescriptions   No medications on file     Note:  This document was prepared using Dragon voice recognition software and may include unintentional dictation errors.    Willy Eddy, MD 12/17/19 [phone]

## 2019-12-17 NOTE — ED Triage Notes (Signed)
Pt via EMS from home. Pt had what the caretaker (pt's dad) called a "lightening-bolt seizure." Per EMS, dad states pt had couple in the past couple of mins. Pt has a hx a seizures. Pt is non-verbal at baseline. Pt's dad has a hx of dementia. EMS states pt did not have a seizure with them. VSS.

## 2019-12-17 NOTE — ED Notes (Signed)
Called lab to get blood work at this time. This RN and Pattricia Boss, RN attempted twice.

## 2019-12-17 NOTE — ED Notes (Signed)
Date and time results received: 12/17/19 1756  Test: 3.5 Critical Value: Lactic Acid  Name of Provider Notified: Dr. Roxan Hockey, MD

## 2019-12-17 NOTE — H&P (Signed)
History and Physical   Alicia Duncan:[id number] DOB: 1975-06-30 DOA: 12/17/2019  Referring MD/NP/PA: Dr Roxan Hockey  PCP: [name], MD   Outpatient Specialists: Dr Stephanie Acre   Patient coming from: Home  Chief Complaint: Seizures  HPI: Alicia Duncan is a 44 y.o. female with medical history significant of seizure disorder, mutism, current altered mental status who has been seen in the ER on July 8 and August 9 this year with similar presentation.  Patient has history of dystonia and Rett syndrome.  She is apparently been taking care of by her demented father at home.  He himself is not in a good shape.  Patient came to the ER and was noted to have multiple seizure episodes.  She is on Trileptal at home but not sure if she is taking it since I caregiver himself is not strong enough and mentally stable.  Levels have been sent out.  Patient loaded with Keppra and being admitted for further evaluation and treatment..  ED Course: Temperature 97 blood pressure 98/65 pulse 93 respiratory 23 oxygen sat 95% room air.  Glucose is 159 otherwise lactic acid 3.5 and CBC within normal.  COVID-19 screen is negative head CT without contrast is negative chest x-ray showed no acute findings.  EKG showed normal sinus.  Patient being admitted with seizure disorders and currently postictal.  Review of Systems: As per HPI otherwise 10 point review of systems negative.    Past Medical History:  Diagnosis Date  . Dystonia   . Rett syndrome   . Seizures (HCC)     History reviewed. No pertinent surgical history.   reports that she has never smoked. She has never used smokeless tobacco. She reports that she does not drink alcohol and does not use drugs.  Allergies  Allergen Reactions  . Benadryl [Diphenhydramine]   . Penicillins Other (See Comments)    Has patient had a PCN reaction causing immediate rash, facial/tongue/throat swelling, SOB or lightheadedness with hypotension: Unknown Has  patient had a PCN reaction causing severe rash involving mucus membranes or skin necrosis: Unknown Has patient had a PCN reaction that required hospitalization: Unknown Has patient had a PCN reaction occurring within the last 10 years: Unknown If all of the above answers are "NO", then may proceed with Cephalosporin use.     Family History  Problem Relation Age of Onset  . Seizures Mother   . Memory loss Father   . Diverticulosis Father   . Schizophrenia Brother      Prior to Admission medications   Medication Sig Start Date End Date Taking? Authorizing Provider  baclofen (LIORESAL) 10 MG tablet Take 10 mg by mouth 2 (two) times daily.     [provider]  feeding supplement, ENSURE ENLIVE, (ENSURE ENLIVE) LIQD Take 237 mLs by mouth 3 (three) times daily with meals. 05/13/16   Katharina Caper, MD  FLUoxetine (PROZAC) 10 MG capsule Take 10 mg by mouth daily. 10/28/19   [provider]  LORazepam (ATIVAN) 1 MG tablet Take 1 tablet (1 mg total) by mouth 2 (two) times daily. 09/14/19   York Spaniel, MD  omeprazole (PRILOSEC) 20 MG capsule Take 20 mg by mouth daily.    [provider]  Oxcarbazepine (TRILEPTAL) 300 MG tablet TAKE 2 TABLETS (600 MG TOTAL) BY MOUTH 2 (TWO) TIMES DAILY. 11/10/19   York Spaniel, MD  tiaGABine (GABITRIL) 2 MG tablet Take 2 mg by mouth 3 (three) times daily. 10/28/19   [provider]    Physical Exam: Vitals:   12/17/19 1706 12/17/19 1717  BP: (!) 127/44   Pulse: 93   Resp: 18   Temp: 98.7 F (37.1 C)   TempSrc: Oral   SpO2: 96%   Weight:  36 kg  Height:  5' (1.524 m)      Constitutional: Small frame, underdeveloped limbs, no distress Vitals:   12/17/19 1706 12/17/19 1717  BP: (!) 127/44   Pulse: 93   Resp: 18   Temp: 98.7 F (37.1 C)   TempSrc: Oral   SpO2: 96%   Weight:  36 kg  Height:  5' (1.524 m)   Eyes: PERRL, lids and conjunctivae normal ENMT: Mucous membranes are moist. Posterior pharynx  clear of any exudate or lesions.Normal dentition.  Neck: normal, supple, no masses, no thyromegaly Respiratory: clear to auscultation bilaterally, no wheezing, no crackles. Normal respiratory effort. No accessory muscle use.  Cardiovascular: Regular rate and rhythm, no murmurs / rubs / gallops. No extremity edema. 2+ pedal pulses. No carotid bruits.  Abdomen: no tenderness, no masses palpated. No hepatosplenomegaly. Bowel sounds positive.  Musculoskeletal: no clubbing / cyanosis.  Significant joint deformity upper and lower extremities. Good ROM, no contractures. Normal muscle tone.  Skin: no rashes, lesions, ulcers. No induration Neurologic: CN 2-12 grossly intact. Sensation intact, DTR normal. Strength 5/5 in all 4.  Psychiatric: Completely obtunded not responding to voice.     Labs on Admission: I have personally reviewed following labs and imaging studies  CBC: Recent Labs  Lab 12/17/19 1728  WBC 8.6  NEUTROABS 5.8  HGB 12.8  HCT 39.0  MCV 88.2  PLT 170   Basic Metabolic Panel: Recent Labs  Lab 12/17/19 1728  NA 139  K 3.5  CL 109  CO2 20*  GLUCOSE 159*  BUN 14  CREATININE 0.52  CALCIUM 9.2   GFR: Estimated Creatinine Clearance: 50.5 mL/min (by C-G formula based on SCr of 0.52 mg/dL). Liver Function Tests: Recent Labs  Lab 12/17/19 1728  AST 38  ALT 28  ALKPHOS 58  BILITOT 0.5  PROT 7.4  ALBUMIN 3.9   No results for input(s): LIPASE, AMYLASE in the last 168 hours. No results for input(s): AMMONIA in the last 168 hours. Coagulation Profile: No results for input(s): INR, PROTIME in the last 168 hours. Cardiac Enzymes: No results for input(s): CKTOTAL, CKMB, CKMBINDEX, TROPONINI in the last 168 hours. BNP (last 3 results) No results for input(s): PROBNP in the last 8760 hours. HbA1C: No results for input(s): HGBA1C in the last 72 hours. CBG: No results for input(s): GLUCAP in the last 168 hours. Lipid Profile: No results for input(s): CHOL, HDL,  LDLCALC, TRIG, CHOLHDL, LDLDIRECT in the last 72 hours. Thyroid Function Tests: No results for input(s): TSH, T4TOTAL, FREET4, T3FREE, THYROIDAB in the last 72 hours. Anemia Panel: No results for input(s): VITAMINB12, FOLATE, FERRITIN, TIBC, IRON, RETICCTPCT in the last 72 hours. Urine analysis:    Component Value Date/Time   COLORURINE AMBER (A) 11/14/2019 2039   APPEARANCEUR TURBID (A) 11/14/2019 2039   LABSPEC 1.024 11/14/2019 2039   PHURINE 7.0 11/14/2019 2039   GLUCOSEU >=500 (A) 11/14/2019 2039   HGBUR NEGATIVE 11/14/2019 2039   BILIRUBINUR NEGATIVE 11/14/2019 2039   KETONESUR NEGATIVE 11/14/2019 2039   PROTEINUR NEGATIVE 11/14/2019 2039   UROBILINOGEN 1.0 04/25/2013 1427   NITRITE NEGATIVE 11/14/2019 2039   LEUKOCYTESUR NEGATIVE 11/14/2019 2039   Sepsis Labs: @LABRCNTIP (procalcitonin:4,lacticidven:4) )No results found for this or any previous visit (from the  past 240 hour(s)).   Radiological Exams on Admission: CT Head Wo Contrast  Result Date: 12/17/2019 CLINICAL DATA:  Nontraumatic seizure EXAM: CT HEAD WITHOUT CONTRAST TECHNIQUE: Contiguous axial images were obtained from the base of the skull through the vertex without intravenous contrast. Sagittal and coronal MPR images reconstructed from axial data set. COMPARISON:  10/13/2019 FINDINGS: Brain: Generalized atrophy. Normal ventricular morphology. Slight asymmetric prominence of the subdural space RIGHT frontal unchanged, without definite mass effect upon the hemisphere. No intracranial hemorrhage, mass lesion or evidence of acute infarction. No new extra-axial fluid collections. Vascular: No hyperdense vessels. Skull: Intact Sinuses/Orbits: Clear Other: N/A IMPRESSION: Generalized atrophy. Slight asymmetric prominence of the subdural space in RIGHT frontal region unchanged. No acute intracranial abnormalities. Electronically Signed   By: Ulyses Southward M.D.   On: 12/17/2019 18:43   DG Chest Portable 1 View  Result Date:  12/17/2019 CLINICAL DATA:  Altered mental status, seizure EXAM: PORTABLE CHEST 1 VIEW COMPARISON:  Radiograph 10/13/2019 FINDINGS: Asymmetric density in the right infrahilar lung is present on comparison compatible with dysmorphic ribs and some scarring seen on comparison CT. No acute consolidative opacities are seen. No convincing features of edema. No pneumothorax or visible effusion. Stable cardiomediastinal contours. Severe dextroscoliotic curvature of the thoracic spine with associated chest wall deformity and prior thoracic fusion, overall appearance of which is unchanged from comparisons. Remote posttraumatic deformity of the mid right clavicle. No acute osseous abnormality. Mild gaseous distention of the upper abdominal bowel, nonspecific. Telemetry leads overlie the chest. IMPRESSION: 1. No acute cardiopulmonary disease. 2. Asymmetric density in the right infrahilar lung is present on comparison likely reflecting dysmorphic ribs and scarring seen on comparison CT. 3. Nonspecific mild gaseous distention of the upper abdominal bowel, correlate for abdominal symptoms. Electronically Signed   By: Kreg Shropshire M.D.   On: 12/17/2019 18:13    EKG: Independently reviewed.  Sinus rhythm with no significant ST changes.  Assessment/Plan Active Problems:   Seizure (HCC)   Rett syndrome     #1 multiple seizure episodes: Patient may have missed her Trileptal.  Will check in the level as indicated.  We will follow closely.  In the meantime continue Keppra.  She is calm since getting Ativan and Keppra in the ER.  Currently postictal not communicating.  #2 Rett syndrome: Stable.  Continue monitoring  #3 possible UTI: Monitor urinalysis.   DVT prophylaxis: Lovenox Code Status: Full code Family Communication: No family at bedside Disposition Plan: Home Consults called: None but neurology may need to be consulted Admission status: Observation  Severity of Illness: The appropriate patient status for  this patient is OBSERVATION. Observation status is judged to be reasonable and necessary in order to provide the required intensity of service to ensure the patient's safety. The patient's presenting symptoms, physical exam findings, and initial radiographic and laboratory data in the context of their medical condition is felt to place them at decreased risk for further clinical deterioration. Furthermore, it is anticipated that the patient will be medically stable for discharge from the hospital within 2 midnights of admission. The following factors support the patient status of observation.   " The patient's presenting symptoms include seizures. " The physical exam findings include completely obtunded with small frame. " The initial radiographic and laboratory data are normal.     Ewen Varnell,LAWAL MD [facility] Pager [phone]  If 7PM-7AM, please contact night-coverage [email or web address] Password [facility]  12/17/2019, 7:23 PM

## 2019-12-17 NOTE — ED Notes (Signed)
Attempted a in and out cath on this pt. Unsuccessful attempt. Purewick placed on patient.

## 2019-12-18 DIAGNOSIS — R569 Unspecified convulsions: Secondary | ICD-10-CM | POA: Diagnosis not present

## 2019-12-18 DIAGNOSIS — G40909 Epilepsy, unspecified, not intractable, without status epilepticus: Secondary | ICD-10-CM | POA: Diagnosis present

## 2019-12-18 DIAGNOSIS — G40919 Epilepsy, unspecified, intractable, without status epilepticus: Secondary | ICD-10-CM | POA: Diagnosis not present

## 2019-12-18 DIAGNOSIS — Z9114 Patient's other noncompliance with medication regimen: Secondary | ICD-10-CM | POA: Diagnosis not present

## 2019-12-18 DIAGNOSIS — G249 Dystonia, unspecified: Secondary | ICD-10-CM | POA: Diagnosis present

## 2019-12-18 DIAGNOSIS — F842 Rett's syndrome: Secondary | ICD-10-CM | POA: Diagnosis not present

## 2019-12-18 DIAGNOSIS — Z79899 Other long term (current) drug therapy: Secondary | ICD-10-CM | POA: Diagnosis not present

## 2019-12-18 DIAGNOSIS — E43 Unspecified severe protein-calorie malnutrition: Secondary | ICD-10-CM | POA: Diagnosis not present

## 2019-12-18 DIAGNOSIS — Z681 Body mass index (BMI) 19 or less, adult: Secondary | ICD-10-CM | POA: Diagnosis not present

## 2019-12-18 DIAGNOSIS — Z20822 Contact with and (suspected) exposure to covid-19: Secondary | ICD-10-CM | POA: Diagnosis present

## 2019-12-18 DIAGNOSIS — D696 Thrombocytopenia, unspecified: Secondary | ICD-10-CM | POA: Diagnosis not present

## 2019-12-18 LAB — CBC
HCT: 35.4 % — ABNORMAL LOW (ref 36.0–46.0)
Hemoglobin: 12 g/dL (ref 12.0–15.0)
MCH: 29.5 pg (ref 26.0–34.0)
MCHC: 33.9 g/dL (ref 30.0–36.0)
MCV: 87 fL (ref 80.0–100.0)
Platelets: 134 10*3/uL — ABNORMAL LOW (ref 150–400)
RBC: 4.07 MIL/uL (ref 3.87–5.11)
RDW: 15.4 % (ref 11.5–15.5)
WBC: 5.5 10*3/uL (ref 4.0–10.5)
nRBC: 0 % (ref 0.0–0.2)

## 2019-12-18 LAB — COMPREHENSIVE METABOLIC PANEL
ALT: 25 U/L (ref 0–44)
AST: 24 U/L (ref 15–41)
Albumin: 3.4 g/dL — ABNORMAL LOW (ref 3.5–5.0)
Alkaline Phosphatase: 48 U/L (ref 38–126)
Anion gap: 6 (ref 5–15)
BUN: 11 mg/dL (ref 6–20)
CO2: 23 mmol/L (ref 22–32)
Calcium: 8.4 mg/dL — ABNORMAL LOW (ref 8.9–10.3)
Chloride: 111 mmol/L (ref 98–111)
Creatinine, Ser: 0.37 mg/dL — ABNORMAL LOW (ref 0.44–1.00)
GFR calc Af Amer: >60 mL/min (ref >60)
GFR calc non Af Amer: >60 mL/min (ref >60)
Glucose, Bld: 111 mg/dL — ABNORMAL HIGH (ref 70–99)
Potassium: 3.6 mmol/L (ref 3.5–5.1)
Sodium: 140 mmol/L (ref 135–145)
Total Bilirubin: 0.6 mg/dL (ref 0.3–1.2)
Total Protein: 6.5 g/dL (ref 6.5–8.1)

## 2019-12-18 LAB — MAGNESIUM: Magnesium: 2.4 mg/dL (ref 1.7–2.4)

## 2019-12-18 LAB — HIV ANTIBODY (ROUTINE TESTING W REFLEX): HIV Screen 4th Generation wRfx: NONREACTIVE

## 2019-12-18 LAB — SARS CORONAVIRUS 2 BY RT PCR (HOSPITAL ORDER, PERFORMED IN ~~LOC~~ HOSPITAL LAB): SARS Coronavirus 2: NEGATIVE

## 2019-12-18 MED ORDER — LORAZEPAM 2 MG/ML IJ SOLN
2.0000 mg | INTRAMUSCULAR | Status: DC | PRN
  Filled 2019-12-18: qty 1

## 2019-12-18 MED ORDER — ENOXAPARIN SODIUM 30 MG/0.3ML ~~LOC~~ SOLN
30.0000 mg | SUBCUTANEOUS | Status: DC
  Administered 2019-12-18 – 2019-12-21 (×4): 30 mg via SUBCUTANEOUS
  Filled 2019-12-18 (×5): qty 0.3

## 2019-12-18 NOTE — ED Notes (Signed)
Report to jennifer, rn.  

## 2019-12-18 NOTE — ED Notes (Signed)
Report from kelly, rn 

## 2019-12-18 NOTE — ED Notes (Addendum)
Pt arouses to verbal stimuli, covered with warm blanket, purewick in place, suction, ambu bag set up at bedside in event of seizure support. nsr on cardiac monitor noted. d5ns infusing at 173mL/hr. No s/s of infiltration noted to iv site. Belonging bag at bedside.

## 2019-12-18 NOTE — Progress Notes (Signed)
PHARMACIST - PHYSICIAN COMMUNICATION  CONCERNING:  Enoxaparin (Lovenox) for DVT Prophylaxis    RECOMMENDATION: Patient was prescribed enoxaparin 40mg  q24 hours for VTE prophylaxis.   Filed Weights   12/17/19 1717  Weight: 36 kg (79 lb 5.9 oz)    Body mass index is 15.5 kg/m.  Estimated Creatinine Clearance: 50.5 mL/min (A) (by C-G formula based on SCr of 0.37 mg/dL (L)).   Based on [facility] policy patient is candidate for enoxaparin 30mg  every 24 hours based on Weight <45kg  DESCRIPTION: Pharmacy has adjusted enoxaparin dose per [facility] policy.  Patient is now receiving enoxaparin 30 mg every 24 hours    , PharmD Clinical Pharmacist  12/18/2019 8:56 AM

## 2019-12-18 NOTE — Progress Notes (Addendum)
PROGRESS NOTE    Alicia Duncan   HUT:[id number]  DOB: [birth year]  PCP: [name], MD    DOA: 12/17/2019 LOS: 0   Brief Narrative   Alicia Duncan is a 44 y.o. female with medical history of Rhett syndrome, developmental delay, seizure disorder, mutism, dystonia who presented to the ED on 12/17/19 with altered mental status due to postictal state after having seizure at home.  Patient had brief seizure on arrival to the ED as well.  She has been seen in the ED on July 8 and August 9 for same presentation.  Patient's legal guardian and primary caregiver is her elderly father.  He administers her medications, but on questioning about meds, he could not recall names or how often, and clearly has at least mild dementia.  Other home support include patient's brother, reportedly has schizophrenia, and a home health aide whom her father is unhappy with due to being too rough with handling the patient.    Social work has been consulted.  APS has been involved, and apparently working on assigning patient a new guardian, given her father's condition.  Patient requires full time care, is fully dependent on others.  Current seizure medication regimen, per chart review of most recent notes from her neurologist, is Trileptal 600 mg BID, Gabitril 4 mg TID, and Ativan 1 mg BID.  Based on conversation with her father at bedside, it is very likely patient is not receiving the medications as prescribed.  Patient loaded with Keppra in the ED.  Admitted for further evaluation and management, and for social work consulted.     Assessment & Plan   Active Problems:   Seizure (HCC)   Rett syndrome   Breakthrough seizure (HCC)   Seizures - breakthrough seizures in the setting of likely medication noncompliance secondary to her father/caregiver's memory issues.  This is 3rd presentation in 3 months for seizures.   --Neurology consulted --Continue IV Keppra 500 mg BID until swallow eval --If swallow  okay, resume Trileptal, Gabitril PO and can stop Keppra --TOC consulted for assistance with home health etc --Follow up Trileptal level  Dystonia / Spasticity - continue Baclofen and Ativan  Rhett syndrome - patient fully dependent on caregivers, is mute, does not interact or follow commands during exam.   Patient BMI: Body mass index is 15.5 kg/m.   DVT prophylaxis: enoxaparin (LOVENOX) injection 30 mg Start: 12/18/19 2200   Diet:  Diet Orders (From admission, onward)    Start     Ordered   12/18/19 0949  DIET - DYS 1 Room service appropriate? Yes; Fluid consistency: Thin  Diet effective now       Comments: Please send new breakfast tray  Question Answer Comment  Room service appropriate? Yes   Fluid consistency: Thin      12/18/19 0948            Code Status: Full Code    Subjective 12/18/19    Patient seen with father, guardian and her primary caregiver, at bedside in the ED on hold for a bed.  Patient appears comfortable, is entirely nonverbal, does not interact or follow commands.  In discussions with her father, it is clear that he is having difficulty with managing her medications.  He does recall the names or schedule, and can't be sure that she is getting them as prescribed.  They have a home health aide who the father is unhappy with, due to her being too rough with the patient, and  other personality issues.  We discussed trying to arrange other assistance before returning home and he is agreeable and very thankful.   Disposition Plan & Communication   Status is: Inpatient  Remains inpatient appropriate because:IV treatments appropriate due to intensity of illness or inability to take PO   Dispo: The patient is from: Home              Anticipated d/c is to: Home              Anticipated d/c date is: 1 day              Patient currently is not medically stable to d/c.        Family Communication: father at bedside during encounter today 9/12     Consults, Procedures, Significant Events   Consultants:   Neurology  Procedures:   None  Antimicrobials:   None   Objective   Vitals:   12/18/19 0530 12/18/19 0600 12/18/19 1511 12/18/19 1635  BP: 103/76 102/64 (!) 142/76 (!) 153/88  Pulse: 60 (!) 59 80 67  Resp:   (!) 22 16  Temp:    98.2 F (36.8 C)  TempSrc:    Axillary  SpO2: 98% 100% 100% 92%  Weight:      Height:       No intake or output data in the 24 hours ending 12/18/19 1816 Filed Weights   12/17/19 1717  Weight: 36 kg    Physical Exam:  General exam: awake, alert, no acute distress, frail, underweight Respiratory system: CTAB anteriorly and laterally on exam limited by patient's condition, normal respiratory effort. Cardiovascular system: normal S1/S2, RRR, no pedal edema.   Gastrointestinal system: concave/sunken abdomen, soft, NT, ND Central nervous system: alert, unable to assess orientation, spasticity of extremities noted, does not follow commands Skin: dry, intact, normal temperature, normal color    Labs   Data Reviewed: I have personally reviewed following labs and imaging studies  CBC: Recent Labs  Lab 12/17/19 1728 12/18/19 0355  WBC 8.6 5.5  NEUTROABS 5.8  --   HGB 12.8 12.0  HCT 39.0 35.4*  MCV 88.2 87.0  PLT 170 134*   Basic Metabolic Panel: Recent Labs  Lab 12/17/19 1728 12/18/19 0355  NA 139 140  K 3.5 3.6  CL 109 111  CO2 20* 23  GLUCOSE 159* 111*  BUN 14 11  CREATININE 0.52 0.37*  CALCIUM 9.2 8.4*  MG  --  2.4   GFR: Estimated Creatinine Clearance: 50.5 mL/min (A) (by C-G formula based on SCr of 0.37 mg/dL (L)). Liver Function Tests: Recent Labs  Lab 12/17/19 1728 12/18/19 0355  AST 38 24  ALT 28 25  ALKPHOS 58 48  BILITOT 0.5 0.6  PROT 7.4 6.5  ALBUMIN 3.9 3.4*   No results for input(s): LIPASE, AMYLASE in the last 168 hours. No results for input(s): AMMONIA in the last 168 hours. Coagulation Profile: No results for input(s): INR, PROTIME  in the last 168 hours. Cardiac Enzymes: No results for input(s): CKTOTAL, CKMB, CKMBINDEX, TROPONINI in the last 168 hours. BNP (last 3 results) No results for input(s): PROBNP in the last 8760 hours. HbA1C: No results for input(s): HGBA1C in the last 72 hours. CBG: No results for input(s): GLUCAP in the last 168 hours. Lipid Profile: No results for input(s): CHOL, HDL, LDLCALC, TRIG, CHOLHDL, LDLDIRECT in the last 72 hours. Thyroid Function Tests: No results for input(s): TSH, T4TOTAL, FREET4, T3FREE, THYROIDAB in the last 72 hours.  Anemia Panel: No results for input(s): VITAMINB12, FOLATE, FERRITIN, TIBC, IRON, RETICCTPCT in the last 72 hours. Sepsis Labs: Recent Labs  Lab 12/17/19 1728 12/17/19 2256  LATICACIDVEN 3.5* 1.0    Recent Results (from the past 240 hour(s))  SARS Coronavirus 2 by RT PCR (hospital order, performed in [facility] hospital lab) Nasopharyngeal Nasopharyngeal Swab     Status: None   Collection Time: 12/17/19 10:56 PM   Specimen: Nasopharyngeal Swab  Result Value Ref Range Status   SARS Coronavirus 2 NEGATIVE NEGATIVE Final    Comment: (NOTE) SARS-CoV-2 target nucleic acids are NOT DETECTED.  The SARS-CoV-2 RNA is generally detectable in upper and lower respiratory specimens during the acute phase of infection. The lowest concentration of SARS-CoV-2 viral copies this assay can detect is 250 copies / mL. A negative result does not preclude SARS-CoV-2 infection and should not be used as the sole basis for treatment or other patient management decisions.  A negative result may occur with improper specimen collection / handling, submission of specimen other than nasopharyngeal swab, presence of viral mutation(s) within the areas targeted by this assay, and inadequate number of viral copies (<250 copies / mL). A negative result must be combined with clinical observations, patient history, and epidemiological information.  Fact Sheet for Patients:    BoilerBrush.com.cy  Fact Sheet for Healthcare Providers: https://pope.com/  This test is not yet approved or  cleared by the [place] FDA and has been authorized for detection and/or diagnosis of SARS-CoV-2 by FDA under an Emergency Use Authorization (EUA).  This EUA will remain in effect (meaning this test can be used) for the duration of the COVID-19 declaration under Section 564(b)(1) of the Act, 21 U.S.C. section 360bbb-3(b)(1), unless the authorization is terminated or revoked sooner.  Performed at [facility], [address] [place], [place] [address]       Imaging Studies   CT Head Wo Contrast  Result Date: 12/17/2019 CLINICAL DATA:  Nontraumatic seizure EXAM: CT HEAD WITHOUT CONTRAST TECHNIQUE: Contiguous axial images were obtained from the base of the skull through the vertex without intravenous contrast. Sagittal and coronal MPR images reconstructed from axial data set. COMPARISON:  10/13/2019 FINDINGS: Brain: Generalized atrophy. Normal ventricular morphology. Slight asymmetric prominence of the subdural space RIGHT frontal unchanged, without definite mass effect upon the hemisphere. No intracranial hemorrhage, mass lesion or evidence of acute infarction. No new extra-axial fluid collections. Vascular: No hyperdense vessels. Skull: Intact Sinuses/Orbits: Clear Other: N/A IMPRESSION: Generalized atrophy. Slight asymmetric prominence of the subdural space in RIGHT frontal region unchanged. No acute intracranial abnormalities. Electronically Signed   By: Ulyses Southward M.D.   On: 12/17/2019 18:43   DG Chest Portable 1 View  Result Date: 12/17/2019 CLINICAL DATA:  Altered mental status, seizure EXAM: PORTABLE CHEST 1 VIEW COMPARISON:  Radiograph 10/13/2019 FINDINGS: Asymmetric density in the right infrahilar lung is present on comparison compatible with dysmorphic ribs and some scarring seen on comparison CT. No acute  consolidative opacities are seen. No convincing features of edema. No pneumothorax or visible effusion. Stable cardiomediastinal contours. Severe dextroscoliotic curvature of the thoracic spine with associated chest wall deformity and prior thoracic fusion, overall appearance of which is unchanged from comparisons. Remote posttraumatic deformity of the mid right clavicle. No acute osseous abnormality. Mild gaseous distention of the upper abdominal bowel, nonspecific. Telemetry leads overlie the chest. IMPRESSION: 1. No acute cardiopulmonary disease. 2. Asymmetric density in the right infrahilar lung is present on comparison likely reflecting dysmorphic ribs and  scarring seen on comparison CT. 3. Nonspecific mild gaseous distention of the upper abdominal bowel, correlate for abdominal symptoms. Electronically Signed   By: Kreg Shropshire M.D.   On: 12/17/2019 18:13     Medications   Scheduled Meds:  enoxaparin (LOVENOX) injection  30 mg Subcutaneous Q24H   Continuous Infusions:  dextrose 5 % and 0.9% NaCl 100 mL/hr at 12/17/19 2259   levETIRAcetam Stopped (12/18/19 0948)       LOS: 0 days    Time spent: 28 minutes with > 50% spent at bedside and in coordination of care.    Pennie Banter, DO [facility]  12/18/2019, 6:16 PM    If 7PM-7AM, please contact night-coverage. How to contact the [facility] Attending or Consulting provider 7A - 7P or covering provider during after hours 7P -7A, for this patient?    1. Check the care team in [facility] and look for a) attending/consulting [facility] provider listed and b) the [facility] team listed 2. Log into [email or web address] and use [place]'s universal password to access. If you do not have the password, please contact the hospital operator. 3. Locate the [facility] provider you are looking for under [facility] and page to a number that you can be directly reached. 4. If you still have difficulty reaching the provider, please page the [facility] (Director on Call)  for the Hospitalists listed on amion for assistance.

## 2019-12-18 NOTE — TOC Progression Note (Addendum)
Transition of Care [facility]) - Progression Note    Patient Details  Name: Alicia Duncan MRN: [id number] Date of Birth: 09/09/1975  Transition of Care [facility]) CM/SW Contact  Marina Goodell Phone Number: [phone] 12/18/2019, 12:42 PM  Clinical Narrative:     CSW spoke with Diona Fanti [facility] APS/DSS [phone].  Verlon Au stated she has spoke with the patient's father/lgl guardian Kewanna Kasprzak, about his concerns with the home health aide.  [name] believes Mr. Crewe is upset because "he feels like the home health aide has taken over his house since she's there 14 hours a day, and he's used to be the only one who took care of Youa."  Verlon Au mentioned the patient's brother Theressa Millard lives at home and has not shared the same concerns as his father.  The patient's brother Quintavia Rogstad, has petitioned to be the patient's legal guardian and is waiting on a court date.   [name] stated the patient's father/legal guardian Mr. Beightol, was told by APS the only way the patient would be able to stay in the home is if the patient has a home health aide, 14 hours a day. The agency is [facility].  Correct number for patient's brother Rease Swinson [phone]  Expected Discharge Plan: Skilled Nursing Facility Barriers to Discharge: Continued Medical Work up  Expected Discharge Plan and Services Expected Discharge Plan: Skilled Nursing Facility In-house Referral: Clinical Social Work   Post Acute Care Choice: Home Health Living arrangements for the past 2 months: Single Family Home                                       Social Determinants of Health (SDOH) Interventions    Readmission Risk Interventions No flowsheet data found.

## 2019-12-18 NOTE — Consult Note (Addendum)
NEURO HOSPITALIST CONSULT NOTE   Requesting physician: Dr. Denton LankGriffith  Reason for Consult: Breakthrough seizure  History obtained from:  Father and Chart     HPI:                                                                                                                                          Alicia Duncan is an 44 y.o. 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 Anne HahnWillis, who presented to the ED from home via EMS yesterday evening for what her father termed a "lightening-bolt seizure."  Per EMS, her father told tham that she had 2 seizures in "the past couple of minutes". Her father, who is her caregiver, has dementia.   After arrival to the ED, she had another brief seizure, during which she desaturated to the low 50s but improved on RA shortly afterwards. She was administered IV Ativan and also was loaded with Keppra in the ED 1000 mg. She has been started on scheduled Keppra at 500 mg BID.    Lactate came back elevated at 3.5.   Per home meds list in Epic, the patient's home anticonfulsant regimen consists of Trileptal 600 mg po BID, Gabitril 4 mg po BID and Ativan 1 mg po BID. She is on Baclofen 10 mg TID for her dystonia. Also on low-dose Prozac at 10 mg QD. Hospitalist who admitted the patient was not sure if she is taking her Trileptal since caregiver is not strong enough and mentally stable. A trileptal metabolite level was sent out.   Of note, she was seen in the ER on July 8 and August 9 with similar presentations.   Past Medical History:  Diagnosis Date   Dystonia    Rett syndrome    Seizures (HCC)     History reviewed. No pertinent surgical history.  Family History  Problem Relation Age of Onset   Seizures Mother    Memory loss Father    Diverticulosis Father    Schizophrenia Brother               Social History:  reports that she has never smoked. She has never  used smokeless tobacco. She reports that she does not drink alcohol and does not use drugs.  Allergies  Allergen Reactions   Benadryl [Diphenhydramine]    Penicillins Other (See Comments)    Has patient had a PCN reaction causing immediate rash, facial/tongue/throat swelling, SOB or lightheadedness with hypotension: Unknown Has patient had a PCN reaction causing severe rash involving mucus membranes or skin necrosis: Unknown Has patient had a PCN reaction that required hospitalization: Unknown Has patient had a PCN reaction occurring within the last 10 years: Unknown If all of the  above answers are "NO", then may proceed with Cephalosporin use.     HOME MEDICATIONS:                                                                                                                     No current facility-administered medications on file prior to encounter.   Current Outpatient Medications on File Prior to Encounter  Medication Sig Dispense Refill   baclofen (LIORESAL) 10 MG tablet Take 10 mg by mouth 3 (three) times daily.      FLUoxetine (PROZAC) 10 MG capsule Take 10 mg by mouth daily.     LORazepam (ATIVAN) 1 MG tablet Take 1 tablet (1 mg total) by mouth 2 (two) times daily. 60 tablet 5   omeprazole (PRILOSEC) 20 MG capsule Take 20 mg by mouth daily.     Oxcarbazepine (TRILEPTAL) 300 MG tablet TAKE 2 TABLETS (600 MG TOTAL) BY MOUTH 2 (TWO) TIMES DAILY. 120 tablet 1   tiaGABine (GABITRIL) 4 MG tablet Take 4 mg by mouth 2 (two) times daily.      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 The patient does not appear to be in any pain.    Blood pressure  102/64, pulse (!) 59, temperature 98.5 F (36.9 C), temperature source Oral, resp. rate 17, height 5' (1.524 m), weight 36 kg, SpO2 100 %.   General Examination:                                                                                                       Physical Exam  HEENT-  Head appears to be smaller than normal for an adult of her age.   Lungs- Has intermittent panting episodes that father states is chronic and behavioral Extremities- No edema. Upper and lower extremity joint contractures and disuse hypoplasia of feet noted.    Neurological Examination Mental Status: Awake and alert. Will occasionally fixate on objects of interest, but mostly seems inattentive to her external environment. Will look at examiner when spoken to, at times, but mostly will not attend to either father or examiner when her name is called. Mute with no verbal output. Does not follow commands.   Cranial Nerves: II: Not cooperative to testing of visual fields. Inconsistent blink to threat. PERRL.   III,IV, VI: No ptosis. Will gaze to left and right conjugately. No nystagmus.  V,VII: Face symmetric. Exhibits tongue, lip and jaw dyskinesia. Reacts to touch bilaterally. VIII: Unable to formally assess.  IX,X: Unable to assess XI: Head is midline XII: Tongue is midline when protruded.  Motor: BUE: Increased flexor tone at elbows and wrists. Dystonic posturing of digits of bilateral hands.  Will spontaneously move BUE with dyskinetic movements. Did not follow any motor commands.  No asymmetry noted.  BLE: Increased adductor tone at hips and increased flexor tone at ankles. Scissoring of BLE is noted.  Will flex at hips and knees in response to plantar stimulation.  No asymmetry noted.  Sensory: Reacts to tactile stimuli x 4   Deep Tendon Reflexes: 2+ bilateral brachioradialis and biceps. 1+ bilateral patellae.  Plantars: Right: downgoing  Left: downgoing Cerebellar/Gait: Unable to assess.     Lab  Results: Basic Metabolic Panel: Recent Labs  Lab 12/17/19 1728 12/18/19 0355  NA 139 140  K 3.5 3.6  CL 109 111  CO2 20* 23  GLUCOSE 159* 111*  BUN 14 11  CREATININE 0.52 0.37*  CALCIUM 9.2 8.4*    CBC: Recent Labs  Lab 12/17/19 1728 12/18/19 0355  WBC 8.6 5.5  NEUTROABS 5.8  --   HGB 12.8 12.0  HCT 39.0 35.4*  MCV 88.2 87.0  PLT 170 134*    Cardiac Enzymes: No results for input(s): CKTOTAL, CKMB, CKMBINDEX, TROPONINI in the last 168 hours.  Lipid Panel: No results for input(s): CHOL, TRIG, HDL, CHOLHDL, VLDL, LDLCALC in the last 168 hours.  Imaging: CT Head Wo Contrast  Result Date: 12/17/2019 CLINICAL DATA:  Nontraumatic seizure EXAM: CT HEAD WITHOUT CONTRAST TECHNIQUE: Contiguous axial images were obtained from the base of the skull through the vertex without intravenous contrast. Sagittal and coronal MPR images reconstructed from axial data set. COMPARISON:  10/13/2019 FINDINGS: Brain: Generalized atrophy. Normal ventricular morphology. Slight asymmetric prominence of the subdural space RIGHT frontal unchanged, without definite mass effect upon the hemisphere. No intracranial hemorrhage, mass lesion or evidence of acute infarction. No new extra-axial fluid collections. Vascular: No hyperdense vessels. Skull: Intact Sinuses/Orbits: Clear Other: N/A IMPRESSION: Generalized atrophy. Slight asymmetric prominence of the subdural space in RIGHT frontal region unchanged. No acute intracranial abnormalities. Electronically Signed   By: Ulyses Southward M.D.   On: 12/17/2019 18:43   DG Chest Portable 1 View  Result Date: 12/17/2019 CLINICAL DATA:  Altered mental status, seizure EXAM: PORTABLE CHEST 1 VIEW COMPARISON:  Radiograph 10/13/2019 FINDINGS: Asymmetric density in the right infrahilar lung is present on comparison compatible with dysmorphic ribs and some scarring seen on comparison CT. No acute consolidative opacities are seen. No convincing features of edema. No pneumothorax  or visible effusion. Stable cardiomediastinal contours. Severe dextroscoliotic curvature of the thoracic  spine with associated chest wall deformity and prior thoracic fusion, overall appearance of which is unchanged from comparisons. Remote posttraumatic deformity of the mid right clavicle. No acute osseous abnormality. Mild gaseous distention of the upper abdominal bowel, nonspecific. Telemetry leads overlie the chest. IMPRESSION: 1. No acute cardiopulmonary disease. 2. Asymmetric density in the right infrahilar lung is present on comparison likely reflecting dysmorphic ribs and scarring seen on comparison CT. 3. Nonspecific mild gaseous distention of the upper abdominal bowel, correlate for abdominal symptoms. Electronically Signed   By: Kreg Shropshire M.D.   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  1. Exam reveals no clinical seizure activity. Mutism, dyskinesia, dystonia and flexion contractures as described above are unchanged from her baseline, per father.  2. Na normal. Corrected Ca of 8.9 is normal. WBC normal. BUN and Cr are unremarkable. No Mg level has been drawn.  3. CT head official Radiology conclusions: Generalized atrophy. Slight asymmetric prominence of the subdural space in RIGHT frontal region unchanged. No acute intracranial abnormalities. 4. 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  5. Father states that he has been having trouble keeping track of his daughter's medications. He states that she may have been missing doses as a result of this.   Recommendations: 1. Mg level (ordered).  2. PT/OT/Speech 3. Continue IV Keppra 500 mg BID until patient passes swallow  evaluation and can reliably take her oxcarbazepine and Gabitril PO. Continue Keppra for an additional 2 days after restarting Trileptal (equal to 5 half-lives of Trileptal's active metabolite, licarbazepine), then stop Keppra.  4. The patient's father shows evidence of memory impairment. He is clearly not able to reliably keep track of the patient's meds. In my opinion he needs help from social work setting up home health to start regular visits this week to directly administer or at least dispense the patient's meds into a weekly pill organizer for father. Father states that both he and his daughter have Medicare. 5. Oxcarbazepine metabolite level is pending.  6. Continue Baclofen and Ativan for her spasticity. Of note, sudden withdrawal from either medication can precipitate withdrawal seizures (less likely to occur with baclofen, more likely with Ativan).     Electronically signed: Dr. Caryl Pina 12/18/2019, 11:31 AM

## 2019-12-18 NOTE — ED Notes (Signed)
Pt is dry, pure wick remains in place pt repositioned in bed independently. ivf infusing without s/s of infiltration to site.

## 2019-12-18 NOTE — Hospital Course (Addendum)
Alicia Duncan is a 44 y.o. female with medical history of Rhett syndrome, developmental delay, seizure disorder, mutism, dystonia who presented to the ED on 12/17/19 with altered mental status due to postictal state after having seizure at home.  Patient had brief seizure on arrival to the ED as well.  She has been seen in the ED on July 8 and August 9 for same presentation.  Patient's legal guardian and primary caregiver is her elderly father.  He administers her medications, but on questioning about meds, he could not recall names or how often, and clearly has at least mild dementia.  Other home support include patient's brother, reportedly has schizophrenia, and a home health aide whom her father is unhappy with due to being too rough with handling the patient.    Social work has been consulted.  APS has been involved, and apparently working on assigning patient a new guardian, given her father's condition.  Patient requires full time care, is fully dependent on others.  Current seizure medication regimen, per chart review of most recent notes from her neurologist, is Trileptal 600 mg BID, Gabitril 4 mg TID, and Ativan 1 mg BID.  Based on conversation with her father at bedside, it is very likely patient is not receiving the medications as prescribed.  Patient loaded with Keppra in the ED.  Admitted for further evaluation and management, and for social work consulted.

## 2019-12-18 NOTE — ED Notes (Signed)
Continue to wait for keppra from pharmacy.

## 2019-12-18 NOTE — TOC Progression Note (Signed)
Transition of Care [facility]) - Progression Note    Patient Details  Name: Alicia Duncan MRN: [id number] Date of Birth: 1975-08-26  Transition of Care [facility]) CM/SW Contact  Shamiah Kahler, Johnnette Litter, [place] Phone Number: 12/18/2019, 4:20 PM - RN and social work Clinical Narrative:  TOC team arranged for [facility] services- RN and social work with [facility]. Spoke to [employer]- [phone]. Conferred with staff nurse who will contact Dr. Valentina Lucks for order. Plan- pt will stay overnight and plan for discharge on tomorrow.    Expected Discharge Plan: Skilled Nursing Facility Barriers to Discharge: Continued Medical Work up  Expected Discharge Plan and Services Expected Discharge Plan: Skilled Nursing Facility In-house Referral: Clinical Social Work   Post Acute Care Choice: Home Health Living arrangements for the past 2 months: Single Family Home                                       Social Determinants of Health (SDOH) Interventions    Readmission Risk Interventions No flowsheet data found.

## 2019-12-18 NOTE — TOC Initial Note (Signed)
Transition of Care [facility]) - Initial/Assessment Note    Patient Details  Name: Alicia Duncan MRN: [id number] Date of Birth: 1975/11/22  Transition of Care [facility]) CM/SW Contact:    Marina Goodell Phone Number:  [phone] 12/18/2019, 11:45 AM  Clinical Narrative:                 Patient has legal guardian  Patient present to [facility]/ED due to seizure.  Patient d/c form [facility] a month ago with APS and [employer] intervention.  Patient has current home health agency, set up by Hulan Fray, however patient's father/legal guardian Sophee Mckimmy [phone], was not able to remember the name of the agency. Mr. Mesch' cell phone number and Bertell Maria, brother's cell phone number do not work, only the house number listed above.  CSW spoke with patient's father for collateral information.  Mr. Savitt stated he is concerned about the current home health aide, because "she's bossy", "a complex individual", "has a strong opinion of herself", "corrects guardian" and " has yelled at [facility]".  This CSW inquired if Mr. Manocchio could give an specific instance when the home health aide yelled at the patient he states "I can't give you and example but she has."  Mr. Kinnaird also stated the home health aide "jerks the patient around" but could not give an example of what he means by that. This CSW asked Mr. Secrist if he had seen any bruising on the patient's body and he stated he had not.   This CSW asked Mr. Brinker if he could remember the name of the home health agency, since it was [employer] along with [facility] DSS, that set up the services.  Mr. Bonnette could not remember the name if the home ehlath agency.  This CSw will contact Amy Sibyl Parr from [facility] [phone], and Diona Fanti from [facility] DSS/APS [phone], to speak with them about Mr. Laroche' concerns.    This CSW explained to Mr. Cohrs the [facility] process.  Mr. Walla seemed a little confused about  the explanation this CSW gave him and stated "I've never heard of that before."  When CSW hung up with Mr. Wisby, he states he was looking forward to getting together with CSW, at my home or his, to discuss things. CSW stated she was calling from [facility], and Mr. Loch stated "okay bye-bye."This CSW is aware there have been concerns over Mr. Harold Hedge' capacity and last time the patient was in [facility]/E, we were told by DSS/APS the patient's brother Genelle Bal would be applying for guardianship.  This CSW was unable to reach [facility].  This CSW will contact [facility] DSS/APS Diona Fanti with concerns about Mr. Markham' (legal guardian) capacity.      Expected Discharge Plan: Skilled Nursing Facility Barriers to Discharge: Continued Medical Work up   Patient Goals and CMS Choice   CMS Medicare.gov Compare Post Acute Care list provided to:: Legal Guardian (Stratton,Randy (Legal Guardian) [phone]) Choice offered to / list presented to : [facility] POA / Guardian  Expected Discharge Plan and Services Expected Discharge Plan: Skilled Nursing Facility In-house Referral: Clinical Social Work   Post Acute Care Choice: Home Health Living arrangements for the past 2 months: Single Family Home                                      Prior Living Arrangements/Services Living arrangements for the  past 2 months: Single Family Home Lives with:: Parents Patient language and need for interpreter reviewed:: No        Need for Family Participation in Patient Care: Yes (Comment) Care giver support system in place?: Yes (comment) Current home services: Homehealth aide Criminal Activity/Legal Involvement Pertinent to Current Situation/Hospitalization: No - Comment as needed  Activities of Daily Living      Permission Sought/Granted      Share Information with NAME: Daneli Butkiewicz, brother           Emotional Assessment Appearance:: Appears younger than stated age Attitude/Demeanor/Rapport: Unable to  Assess Affect (typically observed): Unable to Assess   Alcohol / Substance Use: Not Applicable Psych Involvement: No (comment)  Admission diagnosis:  Seizure [facility]) [R56.9] Patient Active Problem List   Diagnosis Date Noted   Cough 05/13/2016   Pancytopenia (HCC) 05/13/2016   Iron deficiency anemia 05/13/2016   SIRS (systemic inflammatory response syndrome) (HCC) 05/11/2016   Dental infection 05/11/2016   Rett syndrome 05/11/2016   Seizure disorder (HCC) 05/11/2016   Sepsis (HCC) 05/11/2016   Seizure (HCC) 04/25/2013   PCP:  [name], MD Pharmacy:   [facility] [id number] - [address] [place] [address] Phone: [phone] Fax: [phone]     Social Determinants of Health (SDOH) Interventions    Readmission Risk Interventions No flowsheet data found.

## 2019-12-18 NOTE — ED Notes (Signed)
Waiting on keppra from pharmacy. Spoke with david at 0530 to notify of need to send dose.

## 2019-12-19 LAB — CBC
HCT: 37.6 % (ref 36.0–46.0)
Hemoglobin: 12.6 g/dL (ref 12.0–15.0)
MCH: 29.3 pg (ref 26.0–34.0)
MCHC: 33.5 g/dL (ref 30.0–36.0)
MCV: 87.4 fL (ref 80.0–100.0)
Platelets: 132 10*3/uL — ABNORMAL LOW (ref 150–400)
RBC: 4.3 MIL/uL (ref 3.87–5.11)
RDW: 15.1 % (ref 11.5–15.5)
WBC: 5.5 10*3/uL (ref 4.0–10.5)
nRBC: 0 % (ref 0.0–0.2)

## 2019-12-19 NOTE — Progress Notes (Signed)
PROGRESS NOTE    Alicia Duncan   CXK:[id number]  DOB: [birth year]  PCP: [name], MD    DOA: 12/17/2019 LOS: 1   Brief Narrative   Alicia Duncan is a 44 y.o. female with medical history of Rhett syndrome, developmental delay, seizure disorder, mutism, dystonia who presented to the ED on 12/17/19 with altered mental status due to postictal state after having seizure at home.  Patient had brief seizure on arrival to the ED as well.  She has been seen in the ED on July 8 and August 9 for same presentation.  Patient's legal guardian and primary caregiver is her elderly father.  He administers her medications, but on questioning about meds, he could not recall names or how often, and clearly has at least mild dementia.  Other home support include patient's brother, reportedly has schizophrenia, and a home health aide whom her father is unhappy with due to being too rough with handling the patient.    Social work has been consulted.  APS has been involved, and apparently working on assigning patient a new guardian, given her father's condition.  Patient requires full time care, is fully dependent on others.  Current seizure medication regimen, per chart review of most recent notes from her neurologist, is Trileptal 600 mg BID, Gabitril 4 mg TID, and Ativan 1 mg BID.  Based on conversation with her father at bedside, it is very likely patient is not receiving the medications as prescribed.  Patient loaded with Keppra in the ED.  Admitted for further evaluation and management, and for social work consulted.     Assessment & Plan   Active Problems:   Seizure (HCC)   Rett syndrome   Breakthrough seizure (HCC)   Seizures - breakthrough seizures in the setting of possibly medication noncompliance secondary to her father/caregiver's memory issues.  This is 3rd presentation in 3 months for seizures.   --Neurology consulted --Continue IV Keppra 500 mg BID until swallow eval, speech therapy  consulted --If swallow okay, resume Trileptal, Gabitril PO  --Continue Keppra for 2 days after trileptal is restarted after which keppra can be discontinued --TOC consulted for assistance with home health etc   Dystonia / Spasticity - continue Baclofen and Ativan  Rhett syndrome - patient fully dependent on caregivers, is mute, does not interact or follow commands during exam.   Patient BMI: Body mass index is 15.5 kg/m.   DVT prophylaxis: enoxaparin (LOVENOX) injection 30 mg Start: 12/18/19 2200   Diet:  Diet Orders (From admission, onward)    Start     Ordered   12/18/19 0949  DIET - DYS 1 Room service appropriate? Yes; Fluid consistency: Thin  Diet effective now       Comments: Please send new breakfast tray  Question Answer Comment  Room service appropriate? Yes   Fluid consistency: Thin      12/18/19 0948            Code Status: Full Code    Subjective 12/19/19    No reported issues overnight. Patient is calm, in bed.    Disposition Plan & Communication   Status is: Inpatient  Remains inpatient appropriate because:IV treatments appropriate due to intensity of illness or inability to take PO   Dispo: The patient is from: Home              Anticipated d/c is to: Home vs. SNF?              Anticipated  d/c date is: 1 day              Patient currently is not medically stable to d/c.TOC assisting in establishing guardianship with patient's brother.    Family Communication: unable to reach patient father, updated patient brother Alicia Duncan, Procedures, Significant Events   Consultants:   Neurology  Procedures:   None  Antimicrobials:   None   Objective   Vitals:   12/18/19 1511 12/18/19 1635 12/19/19 0545 12/19/19 1130  BP: (!) 142/76 (!) 153/88 138/88 108/70  Pulse: 80 67 68 86  Resp: (!) 22 16 15    Temp:  98.2 F (36.8 C) 97.7 F (36.5 C) 98.1 F (36.7 C)  TempSrc:  Axillary Oral Oral  SpO2: 100% 92% 100% 98%  Weight:        Height:        Intake/Output Summary (Last 24 hours) at 12/19/2019 1132 Last data filed at 12/19/2019 1019 Gross per 24 hour  Intake 2656.58 ml  Output --  Net 2656.58 ml   Filed Weights   12/17/19 1717  Weight: 36 kg    Physical Exam:  General exam: Alert, awake, no distress Respiratory system: Clear to auscultation. Respiratory effort normal. Cardiovascular system:RRR. No murmurs, rubs, gallops. Gastrointestinal system: Abdomen is nondistended, soft and nontender. No organomegaly or masses felt. Normal bowel sounds heard. Central nervous system: limited exam due to mental status Extremities: No C/C/E, +pedal pulses Skin: No rashes, lesions or ulcers Psychiatry: nonverbal      Labs   Data Reviewed: I have personally reviewed following labs and imaging studies  CBC: Recent Labs  Lab 12/17/19 1728 12/18/19 0355 12/19/19 0646  WBC 8.6 5.5 5.5  NEUTROABS 5.8  --   --   HGB 12.8 12.0 12.6  HCT 39.0 35.4* 37.6  MCV 88.2 87.0 87.4  PLT 170 134* 132*   Basic Metabolic Panel: Recent Labs  Lab 12/17/19 1728 12/18/19 0355  NA 139 140  K 3.5 3.6  CL 109 111  CO2 20* 23  GLUCOSE 159* 111*  BUN 14 11  CREATININE 0.52 0.37*  CALCIUM 9.2 8.4*  MG  --  2.4   GFR: Estimated Creatinine Clearance: 50.5 mL/min (A) (by C-G formula based on SCr of 0.37 mg/dL (L)). Liver Function Tests: Recent Labs  Lab 12/17/19 1728 12/18/19 0355  AST 38 24  ALT 28 25  ALKPHOS 58 48  BILITOT 0.5 0.6  PROT 7.4 6.5  ALBUMIN 3.9 3.4*   No results for input(s): LIPASE, AMYLASE in the last 168 hours. No results for input(s): AMMONIA in the last 168 hours. Coagulation Profile: No results for input(s): INR, PROTIME in the last 168 hours. Cardiac Enzymes: No results for input(s): CKTOTAL, CKMB, CKMBINDEX, TROPONINI in the last 168 hours. BNP (last 3 results) No results for input(s): PROBNP in the last 8760 hours. HbA1C: No results for input(s): HGBA1C in the last 72  hours. CBG: No results for input(s): GLUCAP in the last 168 hours. Lipid Profile: No results for input(s): CHOL, HDL, LDLCALC, TRIG, CHOLHDL, LDLDIRECT in the last 72 hours. Thyroid Function Tests: No results for input(s): TSH, T4TOTAL, FREET4, T3FREE, THYROIDAB in the last 72 hours. Anemia Panel: No results for input(s): VITAMINB12, FOLATE, FERRITIN, TIBC, IRON, RETICCTPCT in the last 72 hours. Sepsis Labs: Recent Labs  Lab 12/17/19 1728 12/17/19 2256  LATICACIDVEN 3.5* 1.0    Recent Results (from the past 240 hour(s))  SARS Coronavirus 2 by RT PCR (hospital order, performed in  [facility] hospital lab) Nasopharyngeal Nasopharyngeal Swab     Status: None   Collection Time: 12/17/19 10:56 PM   Specimen: Nasopharyngeal Swab  Result Value Ref Range Status   SARS Coronavirus 2 NEGATIVE NEGATIVE Final    Comment: (NOTE) SARS-CoV-2 target nucleic acids are NOT DETECTED.  The SARS-CoV-2 RNA is generally detectable in upper and lower respiratory specimens during the acute phase of infection. The lowest concentration of SARS-CoV-2 viral copies this assay can detect is 250 copies / mL. A negative result does not preclude SARS-CoV-2 infection and should not be used as the sole basis for treatment or other patient management decisions.  A negative result may occur with improper specimen collection / handling, submission of specimen other than nasopharyngeal swab, presence of viral mutation(s) within the areas targeted by this assay, and inadequate number of viral copies (<250 copies / mL). A negative result must be combined with clinical observations, patient history, and epidemiological information.  Fact Sheet for Patients:   BoilerBrush.com.cy  Fact Sheet for Healthcare Providers: https://pope.com/  This test is not yet approved or  cleared by the [place] FDA and has been authorized for detection and/or diagnosis of SARS-CoV-2  by FDA under an Emergency Use Authorization (EUA).  This EUA will remain in effect (meaning this test can be used) for the duration of the COVID-19 declaration under Section 564(b)(1) of the Act, 21 U.S.C. section 360bbb-3(b)(1), unless the authorization is terminated or revoked sooner.  Performed at [facility], [address] [place], [place] [address]       Imaging Studies   CT Head Wo Contrast  Result Date: 12/17/2019 CLINICAL DATA:  Nontraumatic seizure EXAM: CT HEAD WITHOUT CONTRAST TECHNIQUE: Contiguous axial images were obtained from the base of the skull through the vertex without intravenous contrast. Sagittal and coronal MPR images reconstructed from axial data set. COMPARISON:  10/13/2019 FINDINGS: Brain: Generalized atrophy. Normal ventricular morphology. Slight asymmetric prominence of the subdural space RIGHT frontal unchanged, without definite mass effect upon the hemisphere. No intracranial hemorrhage, mass lesion or evidence of acute infarction. No new extra-axial fluid collections. Vascular: No hyperdense vessels. Skull: Intact Sinuses/Orbits: Clear Other: N/A IMPRESSION: Generalized atrophy. Slight asymmetric prominence of the subdural space in RIGHT frontal region unchanged. No acute intracranial abnormalities. Electronically Signed   By: Ulyses Southward M.D.   On: 12/17/2019 18:43   DG Chest Portable 1 View  Result Date: 12/17/2019 CLINICAL DATA:  Altered mental status, seizure EXAM: PORTABLE CHEST 1 VIEW COMPARISON:  Radiograph 10/13/2019 FINDINGS: Asymmetric density in the right infrahilar lung is present on comparison compatible with dysmorphic ribs and some scarring seen on comparison CT. No acute consolidative opacities are seen. No convincing features of edema. No pneumothorax or visible effusion. Stable cardiomediastinal contours. Severe dextroscoliotic curvature of the thoracic spine with associated chest wall deformity and prior thoracic fusion, overall  appearance of which is unchanged from comparisons. Remote posttraumatic deformity of the mid right clavicle. No acute osseous abnormality. Mild gaseous distention of the upper abdominal bowel, nonspecific. Telemetry leads overlie the chest. IMPRESSION: 1. No acute cardiopulmonary disease. 2. Asymmetric density in the right infrahilar lung is present on comparison likely reflecting dysmorphic ribs and scarring seen on comparison CT. 3. Nonspecific mild gaseous distention of the upper abdominal bowel, correlate for abdominal symptoms. Electronically Signed   By: Kreg Shropshire M.D.   On: 12/17/2019 18:13     Medications   Scheduled Meds: . enoxaparin (LOVENOX) injection  30 mg Subcutaneous Q24H  Continuous Infusions: . dextrose 5 % and 0.9% NaCl 100 mL/hr at 12/19/19 1024  . levETIRAcetam 500 mg (12/19/19 [id number])       LOS: 1 day    Time spent: 30mins    Erick BlinksJehanzeb Kimbella Heisler, MD [facility]  12/19/2019, 11:32 AM    If 7PM-7AM, please contact night-coverage. How to contact the [facility] Attending or Consulting provider 7A - 7P or covering provider during after hours 7P -7A, for this patient?    1. Check the care team in [facility] and look for a) attending/consulting [facility] provider listed and b) the [facility] team listed 2. Log into [email or web address] and use [facility]'s universal password to access. If you do not have the password, please contact the hospital operator. 3. Locate the [facility] provider you are looking for under [facility] and page to a number that you can be directly reached. 4. If you still have difficulty reaching the provider, please page the [facility] (Director on Call) for the Hospitalists listed on amion for assistance.

## 2019-12-20 DIAGNOSIS — D696 Thrombocytopenia, unspecified: Secondary | ICD-10-CM

## 2019-12-20 DIAGNOSIS — E43 Unspecified severe protein-calorie malnutrition: Secondary | ICD-10-CM | POA: Insufficient documentation

## 2019-12-20 MED ORDER — ADULT MULTIVITAMIN W/MINERALS CH
1.0000 | ORAL_TABLET | Freq: Every day | ORAL | Status: DC
  Administered 2019-12-21: 1 via ORAL
  Filled 2019-12-20 (×2): qty 1

## 2019-12-20 MED ORDER — LORAZEPAM 1 MG PO TABS
1.0000 mg | ORAL_TABLET | Freq: Two times a day (BID) | ORAL | Status: DC
  Administered 2019-12-20 – 2019-12-21 (×3): 1 mg via ORAL
  Filled 2019-12-20 (×4): qty 1

## 2019-12-20 MED ORDER — OXCARBAZEPINE 300 MG PO TABS
600.0000 mg | ORAL_TABLET | Freq: Two times a day (BID) | ORAL | Status: DC
  Administered 2019-12-20 – 2019-12-21 (×4): 600 mg via ORAL
  Filled 2019-12-20 (×6): qty 2

## 2019-12-20 MED ORDER — BACLOFEN 10 MG PO TABS
10.0000 mg | ORAL_TABLET | Freq: Three times a day (TID) | ORAL | Status: DC
  Administered 2019-12-20 – 2019-12-21 (×5): 10 mg via ORAL
  Filled 2019-12-20 (×8): qty 1

## 2019-12-20 MED ORDER — TIAGABINE HCL 4 MG PO TABS
4.0000 mg | ORAL_TABLET | Freq: Two times a day (BID) | ORAL | Status: DC
  Administered 2019-12-20 – 2019-12-21 (×4): 4 mg via ORAL
  Filled 2019-12-20 (×3): qty 1
  Filled 2019-12-20: qty 2
  Filled 2019-12-20 (×2): qty 1
  Filled 2019-12-20: qty 2

## 2019-12-20 MED ORDER — ENSURE ENLIVE PO LIQD
237.0000 mL | Freq: Three times a day (TID) | ORAL | Status: DC
  Administered 2019-12-20 – 2019-12-22 (×5): 237 mL via ORAL

## 2019-12-20 NOTE — Progress Notes (Addendum)
PROGRESS NOTE    Alicia Duncan  JEH:[id number] DOB: [birth year] DOA: 12/17/2019 PCP: [name], MD  Assessment & Plan:   Active Problems:   Seizure (HCC)   Rett syndrome   Breakthrough seizure (HCC)   Seizures:  breakthrough seizures in the setting of possibly medication noncompliance secondary to her father/caregiver's memory issues. Continue on IV keppra. Will restart home dose of trileptal, gabitril 12/20/19 and will continue keppra for 2 days after trileptal & gabitril is restarted after which keppra can be discontinued. Has another breakthrough seizure 12/20/19 for appox 20 sec.   Dystonia / Spasticity: continue on home dose of baclofen, ativan  Rhett syndrome: completely dependent on caregivers, is mute, & does not interact   Thrombocytopenia: etiology unclear. Will continue to monitor    DVT prophylaxis: lovenox Code Status: full  Family Communication:  Disposition Plan: likely will d/c back home   Status is: Inpatient  Remains inpatient appropriate because:IV treatments appropriate due to intensity of illness or inability to take PO, had another breakthrough seizure today &  just restarted po anti-seizures meds and must continue keppra x 2 days more and then d/c keppra as per neuro    Dispo: The patient is from: Home              Anticipated d/c is to: Home              Anticipated d/c date is: 2 days              Patient currently is not medically stable to d/c.     Consultants:   Neuro   Procedures:  Antimicrobials:    Subjective: Pt is mute and does not interact.   Objective: Vitals:   12/19/19 1130 12/19/19 1632 12/19/19 1927 12/20/19 0506  BP: 108/70 (!) 145/93 (!) 160/101 (!) 142/92  Pulse: 86 82 97 72  Resp: 16 16 20 18   Temp: 98.1 F (36.7 C) 98.6 F (37 C) 98 F (36.7 C) 97.6 F (36.4 C)  TempSrc: Axillary Axillary Oral   SpO2: 98% 98%    Weight:      Height:        Intake/Output Summary (Last 24 hours) at 12/20/2019  0736 Last data filed at 12/20/2019 0509 Gross per 24 hour  Intake 2869.89 ml  Output --  Net 2869.89 ml   Filed Weights   12/17/19 1717  Weight: 36 kg    Examination:  General exam: Appears calm and comfortable  Respiratory system: diminished breath sounds b/l. No rales  Cardiovascular system: S1 & S2 +. No rubs, gallops or clicks. Gastrointestinal system: Abdomen is nondistended, soft and nontender. Hypoactive bowel sounds heard. Central nervous system: Pt is mute and does not follow simple commands Psychiatry: Judgement and insight appear abnormal. Flat mood and affect     Data Reviewed: I have personally reviewed following labs and imaging studies  CBC: Recent Labs  Lab 12/17/19 1728 12/18/19 0355 12/19/19 0646  WBC 8.6 5.5 5.5  NEUTROABS 5.8  --   --   HGB 12.8 12.0 12.6  HCT 39.0 35.4* 37.6  MCV 88.2 87.0 87.4  PLT 170 134* 132*   Basic Metabolic Panel: Recent Labs  Lab 12/17/19 1728 12/18/19 0355  NA 139 140  K 3.5 3.6  CL 109 111  CO2 20* 23  GLUCOSE 159* 111*  BUN 14 11  CREATININE 0.52 0.37*  CALCIUM 9.2 8.4*  MG  --  2.4   GFR: Estimated Creatinine Clearance: 50.5 mL/min (  A) (by C-G formula based on SCr of 0.37 mg/dL (L)). Liver Function Tests: Recent Labs  Lab 12/17/19 1728 12/18/19 0355  AST 38 24  ALT 28 25  ALKPHOS 58 48  BILITOT 0.5 0.6  PROT 7.4 6.5  ALBUMIN 3.9 3.4*   No results for input(s): LIPASE, AMYLASE in the last 168 hours. No results for input(s): AMMONIA in the last 168 hours. Coagulation Profile: No results for input(s): INR, PROTIME in the last 168 hours. Cardiac Enzymes: No results for input(s): CKTOTAL, CKMB, CKMBINDEX, TROPONINI in the last 168 hours. BNP (last 3 results) No results for input(s): PROBNP in the last 8760 hours. HbA1C: No results for input(s): HGBA1C in the last 72 hours. CBG: No results for input(s): GLUCAP in the last 168 hours. Lipid Profile: No results for input(s): CHOL, HDL, LDLCALC,  TRIG, CHOLHDL, LDLDIRECT in the last 72 hours. Thyroid Function Tests: No results for input(s): TSH, T4TOTAL, FREET4, T3FREE, THYROIDAB in the last 72 hours. Anemia Panel: No results for input(s): VITAMINB12, FOLATE, FERRITIN, TIBC, IRON, RETICCTPCT in the last 72 hours. Sepsis Labs: Recent Labs  Lab 12/17/19 1728 12/17/19 2256  LATICACIDVEN 3.5* 1.0    Recent Results (from the past 240 hour(s))  SARS Coronavirus 2 by RT PCR (hospital order, performed in [facility] hospital lab) Nasopharyngeal Nasopharyngeal Swab     Status: None   Collection Time: 12/17/19 10:56 PM   Specimen: Nasopharyngeal Swab  Result Value Ref Range Status   SARS Coronavirus 2 NEGATIVE NEGATIVE Final    Comment: (NOTE) SARS-CoV-2 target nucleic acids are NOT DETECTED.  The SARS-CoV-2 RNA is generally detectable in upper and lower respiratory specimens during the acute phase of infection. The lowest concentration of SARS-CoV-2 viral copies this assay can detect is 250 copies / mL. A negative result does not preclude SARS-CoV-2 infection and should not be used as the sole basis for treatment or other patient management decisions.  A negative result may occur with improper specimen collection / handling, submission of specimen other than nasopharyngeal swab, presence of viral mutation(s) within the areas targeted by this assay, and inadequate number of viral copies (<250 copies / mL). A negative result must be combined with clinical observations, patient history, and epidemiological information.  Fact Sheet for Patients:   BoilerBrush.com.cy  Fact Sheet for Healthcare Providers: https://pope.com/  This test is not yet approved or  cleared by the [place] FDA and has been authorized for detection and/or diagnosis of SARS-CoV-2 by FDA under an Emergency Use Authorization (EUA).  This EUA will remain in effect (meaning this test can be used) for the  duration of the COVID-19 declaration under Section 564(b)(1) of the Act, 21 U.S.C. section 360bbb-3(b)(1), unless the authorization is terminated or revoked sooner.  Performed at [facility], [address] [place], [place] [address]          Radiology Studies: No results found.      Scheduled Meds: . enoxaparin (LOVENOX) injection  30 mg Subcutaneous Q24H   Continuous Infusions: . dextrose 5 % and 0.9% NaCl 100 mL/hr at 12/20/19 0518  . levETIRAcetam 500 mg (12/20/19 0519)     LOS: 2 days    Time spent: 30 mins     Charise Killian, MD [facility] Pager [phone]  If 7PM-7AM, please contact night-coverage [email or web address] 12/20/2019, 7:36 AM

## 2019-12-20 NOTE — Progress Notes (Signed)
PT Cancellation Note  Patient Details Name: Alicia Duncan MRN: [id number] DOB: January 17, 1976   Cancelled Treatment:    Reason Eval/Treat Not Completed: PT screened, no needs identified, will sign off. Per chart review, pt is nonambulatory at baseline, has WC in home, requires total care at baseline. Pt has no acute skilled PT needs at this time. Will sign off.   2:56 PM, 12/20/19 Rosamaria Lints, PT, DPT Physical Therapist - [facility]  [phone] (ASCOM)    [name] 12/20/2019, 2:56 PM

## 2019-12-20 NOTE — Progress Notes (Signed)
Ch arrived at room in response to RR page. RN let Ch know that RR has been called off. Pt was trying to get up and looked restless on the bed. Ch let RN know to page if any further support is needed.

## 2019-12-20 NOTE — Progress Notes (Signed)
SLP Cancellation Note  Patient Details Name: Alicia Duncan MRN: [id number] DOB: [birth year]   Cancelled treatment:       Reason Eval/Treat Not Completed: Patient declined, no reason specified (chart reviewed; consulted NSG re: pt's status). Per NSG report, pt ate all 3 meals yesterday w/ no gross, overt s/s of aspiration noted by NSG staff. Today, she is NOT taking any po's offered by NSG often putting up her hands/arm to "push away" the feeder/SLP as well as turning her head to avoid the spoon. Suspect this is pt's Non-verbal communication indicating she does not want to po's at this time.  CXR at admit revealed "No acute cardiopulmonary disease"; no immediate suspicion of chronic aspiration as this and previous CXRs in chart have been negative for infiltrates. However, noted "mild gaseous distention of the upper abdominal bowel" which could cause discomfort enough for a pt to not want to take po's.  Discussed the BSE attempt w/ NSG and MD; recommended continue w/ current diet w/ aspiration precautions and monitoring of pt's cues and Non-verbal communication. Frequent oral care as pt allows. ST services will f/u tomorrow for ongoing assessment as pt allows. NSG agreed.      Jerilynn Som, MS, CCC-SLP Speech Language Pathologist Rehab Services [phone] [facility] 12/20/2019, 2:02 PM

## 2019-12-20 NOTE — Progress Notes (Signed)
Rapid response called at 1251. Rapid response RN arrived at 12:52. 2C staff at bedside and stated upon rapid response RN arrival that they no longer needed rapid response support. Rapid response RN left.

## 2019-12-20 NOTE — Evaluation (Signed)
Occupational Therapy Evaluation Patient Details Name: Alicia Duncan MRN: [id number] DOB: 04-22-1975 Today's Date: 12/20/2019    History of Present Illness 44 y.o. female with medical history significant of seizure disorder, mutism, current altered mental status who has been seen in the ER on July 8 and August 9 this year with similar presentation.  Patient has history of dystonia and Rett syndrome.  She is apparently been taking care of by her demented father at home.  He himself is not in a good shape.  Patient came to the ER and was noted to have multiple seizure episodes.  She is on Trileptal at home but not sure if she is taking it since I caregiver himself is not strong enough and mentally stable.  Levels have been sent out.  Patient loaded with Keppra and being admitted for further evaluation and treatment..   Clinical Impression   Pt requires total A at baseline and is bed bound PTA. Pt is nonverbal during session but is agreeable to allow therapist to assess. Pt does sit on EOB with total A supine <>sit. Pt holds self on EOB with min A for ~ 1 minute. Pt with no OT needs at this time as she is at baseline level of care. Pt can return home if competent caregiver available to offer 24/7 level of care.OT will SIGN OFF. Thank you for referral.    Follow Up Recommendations  No OT follow up    Equipment Recommendations  None recommended by OT       Precautions / Restrictions Precautions Precautions: Fall Precaution Comments: seizure precautions      Mobility Bed Mobility    General bed mobility comments: dependent for rolling and supine <>sit onto EOB  Transfers    General transfer comment: Pt was able to hold self up on EOB for ~ 1 minute with R UE behind her before returning to bed        ADL either performed or assessed with clinical judgement   ADL Overall ADL's : At baseline     General ADL Comments: Pt is at dependent level of care from bed at baseline                   Pertinent Vitals/Pain Pain Assessment: Faces Faces Pain Scale: No hurt     Hand Dominance     Extremity/Trunk Assessment Upper Extremity Assessment Upper Extremity Assessment: Generalized weakness;RUE deficits/detail;LUE deficits/detail RUE Deficits / Details: Pt is at baseline with contractures noted and is not able to follow formal assessment LUE Deficits / Details: Pt is at baseline with contractures noted and is not able to follow formal assessment           Communication Communication Communication: Other (comment) (pt is nonverbal)   Cognition Arousal/Alertness: Awake/alert Behavior During Therapy: Flat affect Overall Cognitive Status: History of cognitive impairments - at baseline           General Comments: pt is nonverbal. Pt does not follow commands during session secondary to cognition. Staff in room report pt will cross arms over self and make gestures when she does NOT want something.              Home Living Family/patient expects to be discharged to:: Private residence Living Arrangements: Parent Available Help at Discharge: Family;Available 24 hours/day       Home Equipment: Hospital bed          Prior Functioning/Environment Level of Independence: Needs assistance  Gait / Transfers  Assistance Needed: Pt is bed bound ADL's / Homemaking Assistance Needed: Pt requires total A of all aspects of self care                    OT Goals(Current goals can be found in the care plan section) Acute Rehab OT Goals Patient Stated Goal: pt is nonverbal  OT Frequency:                AM-PAC OT "6 Clicks" Daily Activity     Outcome Measure Help from another person eating meals?: Total Help from another person taking care of personal grooming?: Total Help from another person toileting, which includes using toliet, bedpan, or urinal?: Total Help from another person bathing (including washing, rinsing, drying)?: Total Help from another  person to put on and taking off regular upper body clothing?: Total Help from another person to put on and taking off regular lower body clothing?: Total 6 Click Score: 6   End of Session Nurse Communication: Precautions  Activity Tolerance: Patient tolerated treatment well Patient left: in bed;with nursing/sitter in room;with bed alarm set                   Time: 7902-4097 OT Time Calculation (min): 20 min Charges:  OT General Charges $OT Visit: 1 Visit OT Evaluation $OT Eval Low Complexity: 1 Low OT Treatments $Self Care/Home Management : 8-22 mins  Jackquline Denmark, MS, OTR/L , CBIS ascom [phone]  12/20/19, 3:06 PM

## 2019-12-20 NOTE — Progress Notes (Signed)
Initial Nutrition Assessment  DOCUMENTATION CODES:   Severe malnutrition in context of social or environmental circumstances  INTERVENTION:   Ensure Enlive po BID, each supplement provides 350 kcal and 20 grams of protein  Magic cup TID with meals, each supplement provides 290 kcal and 9 grams of protein  MVI daily   Bowel regimen as needed per MD  NUTRITION DIAGNOSIS:   Severe Malnutrition related to social / environmental circumstances as evidenced by severe fat depletion, severe muscle depletion.  GOAL:   Patient will meet greater than or equal to 90% of their needs  MONITOR:   PO intake, Supplement acceptance, Labs, Weight trends, Skin, I & O's  REASON FOR ASSESSMENT:   Other (Comment) (Low BMI)    ASSESSMENT:   44 y.o. female with medical history of Rhett syndrome, developmental delay, seizure disorder, mutism, dystonia who presented to the ED on 12/17/19 with altered mental status due to postictal state after having seizure   Met with pt in room today. Pt is non-verbal at baseline and unable to provide any history. Per nurse tech and SLP, pt was eating 100% of meals yesterday but she is not eating anything today. Pt did have a seizure earlier and has been having episodes of apnea throughout the day. Per chart review, pt drinks Ensure Plus through a baby bottle at home. RD will add supplements and MVI to help pt meet her estimated needs. Of note, pt is noted to have some abdominal distension; recommend bowel regimen as needed per MD.   Per chart, pt is weight stable at baseline.   Medications reviewed and include: lovenox, NaCl w/ 5% dextrose @100ml /hr  Labs reviewed: creat 0.37(L)  NUTRITION - FOCUSED PHYSICAL EXAM:    Most Recent Value  Orbital Region Moderate depletion  Upper Arm Region Severe depletion  Thoracic and Lumbar Region Severe depletion  Buccal Region Moderate depletion  Temple Region Severe depletion  Clavicle Bone Region Severe depletion   Clavicle and Acromion Bone Region Severe depletion  Scapular Bone Region Severe depletion  Dorsal Hand Severe depletion  Patellar Region Severe depletion  Anterior Thigh Region Severe depletion  Posterior Calf Region Severe depletion  Edema (RD Assessment) None  Hair Reviewed  Eyes Reviewed  Mouth Reviewed  Skin Reviewed  Nails Reviewed     Diet Order:   Diet Order            DIET - DYS 1 Room service appropriate? Yes; Fluid consistency: Thin  Diet effective now                EDUCATION NEEDS:   No education needs have been identified at this time  Skin:  Skin Assessment: Reviewed RN Assessment  Last BM:  9/14- type 6  Height:   Ht Readings from Last 1 Encounters:  12/17/19 5' (1.524 m)    Weight:   Wt Readings from Last 1 Encounters:  12/17/19 36 kg    Ideal Body Weight:  45.4 kg  BMI:  Body mass index is 15.5 kg/m.  Estimated Nutritional Needs:   Kcal:  1300-1500kcal/day  Protein:  65-75g/day  Fluid:  1.1-1.3L/day  Koleen Distance MS, RD, LDN Please refer to [facility] for RD and/or RD on-call/weekend/after hours pager

## 2019-12-21 ENCOUNTER — Encounter: Payer: Self-pay | Admitting: Internal Medicine

## 2019-12-21 ENCOUNTER — Other Ambulatory Visit: Payer: Self-pay

## 2019-12-21 DIAGNOSIS — E43 Unspecified severe protein-calorie malnutrition: Secondary | ICD-10-CM

## 2019-12-21 LAB — CBC
HCT: 35.1 % — ABNORMAL LOW (ref 36.0–46.0)
Hemoglobin: 11.5 g/dL — ABNORMAL LOW (ref 12.0–15.0)
MCH: 29.3 pg (ref 26.0–34.0)
MCHC: 32.8 g/dL (ref 30.0–36.0)
MCV: 89.3 fL (ref 80.0–100.0)
Platelets: 124 10*3/uL — ABNORMAL LOW (ref 150–400)
RBC: 3.93 MIL/uL (ref 3.87–5.11)
RDW: 15.4 % (ref 11.5–15.5)
WBC: 6 10*3/uL (ref 4.0–10.5)
nRBC: 0 % (ref 0.0–0.2)

## 2019-12-21 LAB — BASIC METABOLIC PANEL
Anion gap: 8 (ref 5–15)
BUN: <5 mg/dL — ABNORMAL LOW (ref 6–20)
CO2: 20 mmol/L — ABNORMAL LOW (ref 22–32)
Calcium: 7.7 mg/dL — ABNORMAL LOW (ref 8.9–10.3)
Chloride: 114 mmol/L — ABNORMAL HIGH (ref 98–111)
Creatinine, Ser: 0.39 mg/dL — ABNORMAL LOW (ref 0.44–1.00)
GFR calc Af Amer: >60 mL/min (ref >60)
GFR calc non Af Amer: >60 mL/min (ref >60)
Glucose, Bld: 98 mg/dL (ref 70–99)
Potassium: 2.8 mmol/L — ABNORMAL LOW (ref 3.5–5.1)
Sodium: 142 mmol/L (ref 135–145)

## 2019-12-21 LAB — MAGNESIUM: Magnesium: 1.9 mg/dL (ref 1.7–2.4)

## 2019-12-21 LAB — PHOSPHORUS: Phosphorus: 3.9 mg/dL (ref 2.5–4.6)

## 2019-12-21 MED ORDER — POTASSIUM CHLORIDE CRYS ER 20 MEQ PO TBCR
40.0000 meq | EXTENDED_RELEASE_TABLET | ORAL | Status: DC
  Administered 2019-12-21: 40 meq via ORAL
  Filled 2019-12-21: qty 2

## 2019-12-21 MED ORDER — POTASSIUM CHLORIDE 20 MEQ PO PACK
40.0000 meq | PACK | Freq: Once | ORAL | Status: AC
  Administered 2019-12-21: 40 meq via ORAL
  Filled 2019-12-21: qty 2

## 2019-12-21 NOTE — Evaluation (Addendum)
Clinical/Bedside Swallow Evaluation Patient Details  Name: Alicia Duncan MRN: [id number] Date of Birth: February 15, 1976  Today's Date: 12/21/2019 Time: SLP Start Time (ACUTE ONLY): 1045 SLP Stop Time (ACUTE ONLY): 1145 SLP Time Calculation (min) (ACUTE ONLY): 60 min  Past Medical History:  Past Medical History:  Diagnosis Date  . Dystonia   . Rett syndrome   . Seizures (HCC)    Past Surgical History: History reviewed. No pertinent surgical history. HPI:  Per ED notes, pt is a 44 y.o. female with medical history significant of seizure disorder, mutism, Cognitive decline/mental status decline, current altered mental status who has been seen in the ER on July 8 and August 9 this year with similar presentation.  Patient has history of dystonia and Rett syndrome.  She is apparently been taking care of by her Demented father at home.  He himself is not in a good shape.  Patient came to the ER and was noted to have multiple seizure episodes.  She is on Trileptal at home but not sure if she is taking it since her caregiver himself is not strong enough and mentally stable.  Patient loaded with Keppra and being admitted for further evaluation and treatment.  Currently, she is calm and visually attending to others in room; nonverbal.    Assessment / Plan / Recommendation Clinical Impression  Pt presents w/ oral phase dysphagia but w/ support of feeding and use of a Baby Bottle for drinking liquids, as well as general aspiration precautions, she was able to consume po trials of a modified diet w/ no overt, clinical s/s of aspiration noted. Per chart notes/history, there is report of pt using a Baby Bottle for drinking liquids at home(w/ the Father). With the level of Cognitive/mental status decline Baseline, and the need for FULL, 100% Supervision/Support at all meals for feeding(all ADLs), pt is at risk for not meeting her nutrition/hydration needs adequately. Recommend Dietician f/u for needs. Suspect pt's  oral phase dysphagia is directly impacted by her Baseline dx of Rett Syndrome and other Cognitive/Mental abilities decline(pt is a 44 y.o. female with medical history significant of seizure disorder, mutism, dystonia, Cognitive decline/mental abilities decline, and Rett syndrome). During the oral phase, pt exhibited a more tongue forward/protrusion during bolus prep/acceptance w/ anterior loss occurring. W/ continued support during bolus delivery, pt accepted bolus trials (pureed foods) orally. Noted lingual smacking and increased oral phase time for bolus management, A-P transfer, and overall oral clearing. W/ Baby Bottle drinking, pt exhibited an adequate labial seal around nipple then consecutive sucking to pull fluids from nipple and consume 10+ ozs of fluids, both thin and Nectar consistencies w/ No overt, clinical s/s of aspiration noted - no decline in respiratory status, no coughing/throat clearing. When pt did not want po's, she turned her head and put her hands up; she often tossed the bottles on the bed when she did not want to drink what was offered. OM exam cursory - poor dentition status but adequate lingual/labial strength/control/coordination to drink from a Baby Bottle.  Recommend continue a Puree diet (dysphagia level 1) w/ thin liquids VIA Baby Bottle - Dr. Theora Gianotti w/ #4 nipple utilized this session -- similar flow nipples could be used. General aspiration precautions. Pills Crushed and given in Puree; dissolved in liquids if completely dissolvable for Bottle. FULL, 100% Supervision/Support feeding at all meals. Dietician f/u for support. Pt appears at her Baseline as per chart notes/presentation. Education completed w/ NSG; precautions posted and Baby Bottles left in room. MD  updated. NSG to reconsult if new needs arise this admit. Bottles given for Discharge. SLP Visit Diagnosis: Dysphagia, oral phase (R13.11) (Cognitive decline/mental abilities decline).    Aspiration Risk  Mild  aspiration risk;Risk for inadequate nutrition/hydration    Diet Recommendation  Dysphagia level 1 (puree) w/ gravies to moisten; Thin liquids VIA Baby Bottle. General aspiration precautions. Support feeding and full, 100% supervision at all meals.   Medication Administration: Crushed with puree (for safer swallowing)    Other  Recommendations Recommended Consults:  (Dietician f/u) Oral Care Recommendations: Oral care BID;Oral care before and after PO;Staff/trained caregiver to provide oral care Other Recommendations:  (n/a)   Follow up Recommendations None      Frequency and Duration  (n/a)   (n/a)       Prognosis Prognosis for Safe Diet Advancement: Fair Barriers to Reach Goals: Cognitive deficits;Language deficits;Time post onset;Severity of deficits;Behavior      Swallow Study   General Date of Onset: 12/17/19 HPI: Per ED notes, pt is a 44 y.o. female with medical history significant of seizure disorder, mutism, current altered mental status who has been seen in the ER on July 8 and August 9 this year with similar presentation.  Patient has history of dystonia and Rett syndrome.  She is apparently been taking care of by her Demented father at home.  He himself is not in a good shape.  Patient came to the ER and was noted to have multiple seizure episodes.  She is on Trileptal at home but not sure if she is taking it since her caregiver himself is not strong enough and mentally stable.  Patient loaded with Keppra and being admitted for further evaluation and treatment.  Currently, she is calm and visually attending to others in room; nonverbal.  Type of Study: Bedside Swallow Evaluation Previous Swallow Assessment: none noted Diet Prior to this Study: Dysphagia 1 (puree);Thin liquids (not drinking liquids per NSG) Temperature Spikes Noted: No (wbc 6.0) Respiratory Status: Room air History of Recent Intubation: No Behavior/Cognition: Alert;Cooperative;Pleasant  mood;Confused;Distractible;Doesn't follow directions ([place]; baseline Cognitive decline/MR) Oral Cavity Assessment:  (CNT) Oral Care Completed by SLP: Recent completion by staff Oral Cavity - Dentition: Poor condition Vision: Functional for self-feeding (appears) Self-Feeding Abilities: Able to feed self (holding Baby Bottle to drink from) Patient Positioning: Postural control adequate for testing Baseline Vocal Quality:  (nonverbal) Volitional Cough: Cognitively unable to elicit Volitional Swallow: Unable to elicit    Oral/Motor/Sensory Function Overall Oral Motor/Sensory Function:  (functional w/ baby bottle sucking/swallowing)   Ice Chips Ice chips: Not tested   Thin Liquid Thin Liquid: Impaired Presentation: Self Fed (Baby Bottle - 4+ ozs) Oral Phase Impairments:  (tongue forward position) Pharyngeal  Phase Impairments:  (none)    Nectar Thick Nectar Thick Liquid: Impaired Presentation: Self Fed (Baby bottle - 6+ ozs) Oral Phase Impairments:  (tongue forward position) Pharyngeal Phase Impairments:  (none)   Honey Thick Honey Thick Liquid: Not tested   Puree Puree: Impaired Presentation: Spoon (fed; 10 trials) Oral Phase Impairments: Poor awareness of bolus;Reduced lingual movement/coordination (tongue forward position) Oral Phase Functional Implications:  (anterior leakage) Pharyngeal Phase Impairments:  (none)   Solid     Solid: Not tested          Jerilynn Som, MS, CCC-SLP Speech Language Pathologist Rehab [facility] [phone] Saanya Zieske 12/21/2019,11:51 AM

## 2019-12-21 NOTE — Care Management Important Message (Signed)
Important Message  Patient Details  Name: BRIGHTEN BUZZELLI MRN: [id number] Date of Birth: 03-24-1976   Medicare Important Message Given:  Other (see comment)  Attempted to reach legal guardian to review Medicare IM twice by phone.  Line busy for each attempt.     [name] 12/21/2019, 11:25 AM

## 2019-12-21 NOTE — Progress Notes (Signed)
Progress Note    Alicia Duncan  UTM:[id number] DOB: [birth year]  DOA: 12/17/2019 PCP: [name], MD      Brief Narrative:    Medical records reviewed and are as summarized below:  Alicia Duncan is a 44 y.o. female       Assessment/Plan:   Active Problems:   Seizure (HCC)   Rett syndrome   Breakthrough seizure (HCC)   Protein-calorie malnutrition, severe   Nutrition Problem: Severe Malnutrition Etiology: social / environmental circumstances  Signs/Symptoms: severe fat depletion, severe muscle depletion   Body mass index is 15.5 kg/m.  (Underweight)  PLAN  Continue IV Keppra until 12/22/2019 Continue lorazepam, oxcarbazepine and Ti[place]ene Replete potassium and monitor levels Speech therapist recommended dysphagia 1 diet with thin liquids via baby bottle Plan for discharge to home tomorrow   Diet Order            DIET - DYS 1 Room service appropriate? Yes; Fluid consistency: Thin  Diet effective now                    Consultants:  Neurologist  Procedures:  None    Medications:   . baclofen  10 mg Oral TID  . enoxaparin (LOVENOX) injection  30 mg Subcutaneous Q24H  . feeding supplement (ENSURE ENLIVE)  237 mL Oral TID BM  . LORazepam  1 mg Oral BID  . multivitamin with minerals  1 tablet Oral Daily  . OXcarbazepine  600 mg Oral BID  . potassium chloride  40 mEq Oral Q4H  . tiaGABine  4 mg Oral BID   Continuous Infusions: . dextrose 5 % and 0.9% NaCl 100 mL/hr at 12/21/19 0208  . levETIRAcetam 500 mg (12/21/19 0526)     Anti-infectives (From admission, onward)   None             Family Communication/Anticipated D/C date and plan/Code Status   DVT prophylaxis: enoxaparin (LOVENOX) injection 30 mg Start: 12/18/19 2200     Code Status: Full Code  Family Communication:  Disposition Plan:    Status is: Inpatient  Remains inpatient appropriate because:IV treatments appropriate due to intensity of illness  or inability to take PO   Dispo: The patient is from: Home              Anticipated d/c is to: Home              Anticipated d/c date is: 1 day              Patient currently is not medically stable to d/c.           Subjective:   Patient is nonverbal and unable to provide any history.  Her nurse tech was at  The bedside feeding the patient  Objective:    Vitals:   12/20/19 1252 12/20/19 1951 12/21/19 0453 12/21/19 1007  BP: (!) 169/95 137/77 103/60 (!) 145/120  Pulse: 97 75 61 89  Resp: (!) 22 20 18 20   Temp:  97.7 F (36.5 C) (!) 97.5 F (36.4 C) 97.7 F (36.5 C)  TempSrc:  Oral Axillary Oral  SpO2: 98% 93% 99% 100%  Weight:      Height:       No data found.   Intake/Output Summary (Last 24 hours) at 12/21/2019 1038 Last data filed at 12/21/2019 0526 Gross per 24 hour  Intake 2593.76 ml  Output --  Net 2593.76 ml   Filed Weights   12/17/19  1717  Weight: 36 kg    Exam:  GEN: NAD SKIN: Warm and dry EYES: No pallor or icterus ENT: MMM CV: RRR PULM: CTA B ABD: soft, ND, NT, +BS CNS: Alert but nonverbal EXT: No edema or tenderness   Data Reviewed:   I have personally reviewed following labs and imaging studies:  Labs: Labs show the following:   Basic Metabolic Panel: Recent Labs  Lab 12/17/19 1728 12/17/19 1728 12/18/19 0355 12/21/19 0434  NA 139  --  140 142  K 3.5   < > 3.6 2.8*  CL 109  --  111 114*  CO2 20*  --  23 20*  GLUCOSE 159*  --  111* 98  BUN 14  --  11 <5*  CREATININE 0.52  --  0.37* 0.39*  CALCIUM 9.2  --  8.4* 7.7*  MG  --   --  2.4 1.9  PHOS  --   --   --  3.9   < > = values in this interval not displayed.   GFR Estimated Creatinine Clearance: 50.5 mL/min (A) (by C-G formula based on SCr of 0.39 mg/dL (L)). Liver Function Tests: Recent Labs  Lab 12/17/19 1728 12/18/19 0355  AST 38 24  ALT 28 25  ALKPHOS 58 48  BILITOT 0.5 0.6  PROT 7.4 6.5  ALBUMIN 3.9 3.4*   No results for input(s): LIPASE, AMYLASE  in the last 168 hours. No results for input(s): AMMONIA in the last 168 hours. Coagulation profile No results for input(s): INR, PROTIME in the last 168 hours.  CBC: Recent Labs  Lab 12/17/19 1728 12/18/19 0355 12/19/19 0646 12/21/19 0434  WBC 8.6 5.5 5.5 6.0  NEUTROABS 5.8  --   --   --   HGB 12.8 12.0 12.6 11.5*  HCT 39.0 35.4* 37.6 35.1*  MCV 88.2 87.0 87.4 89.3  PLT 170 134* 132* 124*   Cardiac Enzymes: No results for input(s): CKTOTAL, CKMB, CKMBINDEX, TROPONINI in the last 168 hours. BNP (last 3 results) No results for input(s): PROBNP in the last 8760 hours. CBG: No results for input(s): GLUCAP in the last 168 hours. D-Dimer: No results for input(s): DDIMER in the last 72 hours. Hgb A1c: No results for input(s): HGBA1C in the last 72 hours. Lipid Profile: No results for input(s): CHOL, HDL, LDLCALC, TRIG, CHOLHDL, LDLDIRECT in the last 72 hours. Thyroid function studies: No results for input(s): TSH, T4TOTAL, T3FREE, THYROIDAB in the last 72 hours.  Invalid input(s): FREET3 Anemia work up: No results for input(s): VITAMINB12, FOLATE, FERRITIN, TIBC, IRON, RETICCTPCT in the last 72 hours. Sepsis Labs: Recent Labs  Lab 12/17/19 1728 12/17/19 2256 12/18/19 0355 12/19/19 0646 12/21/19 0434  WBC 8.6  --  5.5 5.5 6.0  LATICACIDVEN 3.5* 1.0  --   --   --     Microbiology Recent Results (from the past 240 hour(s))  SARS Coronavirus 2 by RT PCR (hospital order, performed in [facility] hospital lab) Nasopharyngeal Nasopharyngeal Swab     Status: None   Collection Time: 12/17/19 10:56 PM   Specimen: Nasopharyngeal Swab  Result Value Ref Range Status   SARS Coronavirus 2 NEGATIVE NEGATIVE Final    Comment: (NOTE) SARS-CoV-2 target nucleic acids are NOT DETECTED.  The SARS-CoV-2 RNA is generally detectable in upper and lower respiratory specimens during the acute phase of infection. The lowest concentration of SARS-CoV-2 viral copies this assay can detect is  250 copies / mL. A negative result does not preclude SARS-CoV-2 infection  and should not be used as the sole basis for treatment or other patient management decisions.  A negative result may occur with improper specimen collection / handling, submission of specimen other than nasopharyngeal swab, presence of viral mutation(s) within the areas targeted by this assay, and inadequate number of viral copies (<250 copies / mL). A negative result must be combined with clinical observations, patient history, and epidemiological information.  Fact Sheet for Patients:   BoilerBrush.com.cy  Fact Sheet for Healthcare Providers: https://pope.com/  This test is not yet approved or  cleared by the [place] FDA and has been authorized for detection and/or diagnosis of SARS-CoV-2 by FDA under an Emergency Use Authorization (EUA).  This EUA will remain in effect (meaning this test can be used) for the duration of the COVID-19 declaration under Section 564(b)(1) of the Act, 21 U.S.C. section 360bbb-3(b)(1), unless the authorization is terminated or revoked sooner.  Performed at [facility], [address] [place], [place] [address]     Procedures and diagnostic studies:  No results found.             LOS: 3 days   Edvin Albus  [facility]   Pager on [email or web address]. If 7PM-7AM, please contact night-coverage at [email or web address]     12/21/2019, 10:38 AM

## 2019-12-22 LAB — POTASSIUM: Potassium: 3.8 mmol/L (ref 3.5–5.1)

## 2019-12-22 NOTE — Discharge Summary (Signed)
Physician Discharge Summary  Alicia Duncan YQI:[id number] DOB: [birth year] DOA: 12/17/2019  PCP: Alicia Mina, MD  Admit date: 12/17/2019 Discharge date: 12/22/2019  Discharge disposition: Home   Recommendations for Outpatient Follow-Up:   Follow-up with PCP 1 week   Discharge Diagnosis:   Active Problems:   Seizure (HCC)   Rett syndrome   Breakthrough seizure (HCC)   Protein-calorie malnutrition, severe    Discharge Condition: Stable.  Diet recommendation:  Diet Order            DIET - DYS 1           DIET - DYS 1 Room service appropriate? Yes; Fluid consistency: Thin  Diet effective now                   Code Status: Full Code     Hospital Course:   Ms. Alicia Wease Parrisis a 44 y.o.femalewith medical history ofRhett syndrome, developmental delay, seizure disorder, mutism, dystonia who presented to the ED on 12/17/19 with altered mental status due to postictal state after having seizure at home.  Patient had brief seizure on arrival to the ED as well.  She has been seen in the ED on July 8 and August 9 for same presentation.  Patient's legal guardian and primary caregiver is her elderly father.  However, it appears her father has cognitive impairment and there were concerns that he was probably forgetting to administer the seizure medicines to the patient.  The patient was evaluated by the neurologist.  She was treated with IV Keppra and home antiepileptics (lorazepam, oxcarbazepine and Tiagabene) were resumed.  She did not have any further seizure episodes while on admission.  She was evaluated by speech therapist for dysphagia who recommended dysphagia 1 diet with thin liquids via a baby feeding bottle.  She is deemed stable for discharge to home today.  Case manager was consulted to arrange for home health care.  I spoke to her father, Mr. Alicia Duncan, on the day of discharge.  I recommended that patient see her primary care physician within 1 week of  discharge.  Mr. Alicia Duncan told me that patient sees a PCP at the [facility] clinic    Medical Consultants:    Neurologist   Discharge Exam:    Vitals:   12/21/19 1425 12/21/19 2121 12/22/19 0646 12/22/19 1140  BP: 121/65 116/67 (!) 141/84 139/73  Pulse: (!) 106 99 (!) 102 (!) 109  Resp:  15 18 18   Temp:  97.7 F (36.5 C) 98.1 F (36.7 C) 98.8 F (37.1 C)  TempSrc:  Axillary Axillary Oral  SpO2:  99% 97% 97%  Weight:      Height:         GEN: NAD SKIN: Warm and dry EYES: Anicteric ENT: MMM CV: RRR PULM: CTA B ABD: soft, ND, NT, +BS CNS: Alert but nonverbal EXT: No edema or tenderness   The results of significant diagnostics from this hospitalization (including imaging, microbiology, ancillary and laboratory) are listed below for reference.     Procedures and Diagnostic Studies:   CT Head Wo Contrast  Result Date: 12/17/2019 CLINICAL DATA:  Nontraumatic seizure EXAM: CT HEAD WITHOUT CONTRAST TECHNIQUE: Contiguous axial images were obtained from the base of the skull through the vertex without intravenous contrast. Sagittal and coronal MPR images reconstructed from axial data set. COMPARISON:  10/13/2019 FINDINGS: Brain: Generalized atrophy. Normal ventricular morphology. Slight asymmetric prominence of the subdural space RIGHT frontal unchanged, without definite mass effect upon the  hemisphere. No intracranial hemorrhage, mass lesion or evidence of acute infarction. No new extra-axial fluid collections. Vascular: No hyperdense vessels. Skull: Intact Sinuses/Orbits: Clear Other: N/A IMPRESSION: Generalized atrophy. Slight asymmetric prominence of the subdural space in RIGHT frontal region unchanged. No acute intracranial abnormalities. Electronically Signed   By: Ulyses Southward M.D.   On: 12/17/2019 18:43   DG Chest Portable 1 View  Result Date: 12/17/2019 CLINICAL DATA:  Altered mental status, seizure EXAM: PORTABLE CHEST 1 VIEW COMPARISON:  Radiograph 10/13/2019 FINDINGS:  Asymmetric density in the right infrahilar lung is present on comparison compatible with dysmorphic ribs and some scarring seen on comparison CT. No acute consolidative opacities are seen. No convincing features of edema. No pneumothorax or visible effusion. Stable cardiomediastinal contours. Severe dextroscoliotic curvature of the thoracic spine with associated chest wall deformity and prior thoracic fusion, overall appearance of which is unchanged from comparisons. Remote posttraumatic deformity of the mid right clavicle. No acute osseous abnormality. Mild gaseous distention of the upper abdominal bowel, nonspecific. Telemetry leads overlie the chest. IMPRESSION: 1. No acute cardiopulmonary disease. 2. Asymmetric density in the right infrahilar lung is present on comparison likely reflecting dysmorphic ribs and scarring seen on comparison CT. 3. Nonspecific mild gaseous distention of the upper abdominal bowel, correlate for abdominal symptoms. Electronically Signed   By: Kreg Shropshire M.D.   On: 12/17/2019 18:13     Labs:   Basic Metabolic Panel: Recent Labs  Lab 12/17/19 1728 12/17/19 1728 12/18/19 0355 12/18/19 0355 12/21/19 0434 12/22/19 0429  NA 139  --  140  --  142  --   K 3.5   < > 3.6   < > 2.8* 3.8  CL 109  --  111  --  114*  --   CO2 20*  --  23  --  20*  --   GLUCOSE 159*  --  111*  --  98  --   BUN 14  --  11  --  <5*  --   CREATININE 0.52  --  0.37*  --  0.39*  --   CALCIUM 9.2  --  8.4*  --  7.7*  --   MG  --   --  2.4  --  1.9  --   PHOS  --   --   --   --  3.9  --    < > = values in this interval not displayed.   GFR Estimated Creatinine Clearance: 50.5 mL/min (A) (by C-G formula based on SCr of 0.39 mg/dL (L)). Liver Function Tests: Recent Labs  Lab 12/17/19 1728 12/18/19 0355  AST 38 24  ALT 28 25  ALKPHOS 58 48  BILITOT 0.5 0.6  PROT 7.4 6.5  ALBUMIN 3.9 3.4*   No results for input(s): LIPASE, AMYLASE in the last 168 hours. No results for input(s):  AMMONIA in the last 168 hours. Coagulation profile No results for input(s): INR, PROTIME in the last 168 hours.  CBC: Recent Labs  Lab 12/17/19 1728 12/18/19 0355 12/19/19 0646 12/21/19 0434  WBC 8.6 5.5 5.5 6.0  NEUTROABS 5.8  --   --   --   HGB 12.8 12.0 12.6 11.5*  HCT 39.0 35.4* 37.6 35.1*  MCV 88.2 87.0 87.4 89.3  PLT 170 134* 132* 124*   Cardiac Enzymes: No results for input(s): CKTOTAL, CKMB, CKMBINDEX, TROPONINI in the last 168 hours. BNP: Invalid input(s): POCBNP CBG: No results for input(s): GLUCAP in the last 168 hours. D-Dimer No results  for input(s): DDIMER in the last 72 hours. Hgb A1c No results for input(s): HGBA1C in the last 72 hours. Lipid Profile No results for input(s): CHOL, HDL, LDLCALC, TRIG, CHOLHDL, LDLDIRECT in the last 72 hours. Thyroid function studies No results for input(s): TSH, T4TOTAL, T3FREE, THYROIDAB in the last 72 hours.  Invalid input(s): FREET3 Anemia work up No results for input(s): VITAMINB12, FOLATE, FERRITIN, TIBC, IRON, RETICCTPCT in the last 72 hours. Microbiology Recent Results (from the past 240 hour(s))  SARS Coronavirus 2 by RT PCR (hospital order, performed in [facility] hospital lab) Nasopharyngeal Nasopharyngeal Swab     Status: None   Collection Time: 12/17/19 10:56 PM   Specimen: Nasopharyngeal Swab  Result Value Ref Range Status   SARS Coronavirus 2 NEGATIVE NEGATIVE Final    Comment: (NOTE) SARS-CoV-2 target nucleic acids are NOT DETECTED.  The SARS-CoV-2 RNA is generally detectable in upper and lower respiratory specimens during the acute phase of infection. The lowest concentration of SARS-CoV-2 viral copies this assay can detect is 250 copies / mL. A negative result does not preclude SARS-CoV-2 infection and should not be used as the sole basis for treatment or other patient management decisions.  A negative result may occur with improper specimen collection / handling, submission of specimen other than  nasopharyngeal swab, presence of viral mutation(s) within the areas targeted by this assay, and inadequate number of viral copies (<250 copies / mL). A negative result must be combined with clinical observations, patient history, and epidemiological information.  Fact Sheet for Patients:   BoilerBrush.com.cy  Fact Sheet for Healthcare Providers: https://pope.com/  This test is not yet approved or  cleared by the [place] FDA and has been authorized for detection and/or diagnosis of SARS-CoV-2 by FDA under an Emergency Use Authorization (EUA).  This EUA will remain in effect (meaning this test can be used) for the duration of the COVID-19 declaration under Section 564(b)(1) of the Act, 21 U.S.C. section 360bbb-3(b)(1), unless the authorization is terminated or revoked sooner.  Performed at [facility], [address] [place], [place] [address]      Discharge Instructions:   Discharge Instructions    DIET - DYS 1   Complete by: As directed    Use baby bottle for liquids   Fluid consistency: Thin   Increase activity slowly   Complete by: As directed      Allergies as of 12/22/2019      Reactions   Benadryl [diphenhydramine]    Penicillins Other (See Comments)   Has patient had a PCN reaction causing immediate rash, facial/tongue/throat swelling, SOB or lightheadedness with hypotension: Unknown Has patient had a PCN reaction causing severe rash involving mucus membranes or skin necrosis: Unknown Has patient had a PCN reaction that required hospitalization: Unknown Has patient had a PCN reaction occurring within the last 10 years: Unknown If all of the above answers are "NO", then may proceed with Cephalosporin use.      Medication List    TAKE these medications   baclofen 10 MG tablet Commonly known as: LIORESAL Take 10 mg by mouth 3 (three) times daily.   FLUoxetine 10 MG capsule Commonly known as:  PROZAC Take 10 mg by mouth daily.   LORazepam 1 MG tablet Commonly known as: ATIVAN Take 1 tablet (1 mg total) by mouth 2 (two) times daily.   omeprazole 20 MG capsule Commonly known as: PRILOSEC Take 20 mg by mouth daily.   Oxcarbazepine 300 MG tablet Commonly known as:  TRILEPTAL TAKE 2 TABLETS (600 MG TOTAL) BY MOUTH 2 (TWO) TIMES DAILY.   tiaGABine 4 MG tablet Commonly known as: GABITRIL Take 4 mg by mouth 2 (two) times daily.         Time coordinating discharge: 32 minutes  Signed:  Lysander Calixte  [facility] 12/22/2019, 11:52 AM   Pager on [email or web address]. If 7PM-7AM, please contact night-coverage at [email or web address]

## 2019-12-22 NOTE — Care Management Important Message (Signed)
Important Message  Patient Details  Name: Alicia Duncan MRN: [id number] Date of Birth: 1975/10/18   Medicare Important Message Given:  Yes  Reviewed by phone with legal guardian, Robby Pirani.  Verbal consent given to sign Medicare IM.  Copy to be mailed to guardian's attention to home address on file.  Verified address as correct.     [name] 12/22/2019, 1:59 PM

## 2019-12-22 NOTE — TOC Transition Note (Signed)
Transition of Care [facility]) - CM/SW Discharge Note   Patient Details  Name: Alicia Duncan MRN: [id number] Date of Birth: 1975/06/15  [facility] CM/SW Contact:  Chapman Fitch, RN Phone Number: 12/22/2019, 2:25 PM   Clinical Narrative:     Patient to discharge home today Dad who is gaurdian notified and confirmed that he will be at home to accept patient.   Barbara Cower with [facility] notified of discharge EMS packet printed   EMS transport called   Final next level of care: Home w Home Health Services Barriers to Discharge: No Barriers Identified   Patient Goals and CMS Choice   CMS Medicare.gov Compare Post Acute Care list provided to:: Legal Guardian (Pruiett,Randy (Legal Guardian) [phone]) Choice offered to / list presented to : [facility] POA / Guardian  Discharge Placement                Patient to be transferred to facility by: EMS Name of family member notified: Dad Patient and family notified of of transfer: 12/22/19  Discharge Plan and Services In-house Referral: Clinical Social Work   Post Acute Care Choice: Home Health                    HH Arranged: RN, Nurse's Aide, Social Work [employer] Agency: [facility] Date HH Agency Contacted: 12/22/19   Representative spoke with at [facility] Agency: Barbara Cower  Social Determinants of Health (SDOH) Interventions     Readmission Risk Interventions No flowsheet data found.

## 2019-12-28 ENCOUNTER — Other Ambulatory Visit: Payer: Self-pay | Admitting: Neurology

## 2020-01-04 ENCOUNTER — Other Ambulatory Visit: Payer: Self-pay | Admitting: Neurology

## 2020-01-19 ENCOUNTER — Other Ambulatory Visit: Payer: Self-pay | Admitting: Neurology

## 2020-02-13 ENCOUNTER — Inpatient Hospital Stay: Payer: Medicare Other

## 2020-02-13 ENCOUNTER — Emergency Department: Payer: Medicare Other

## 2020-02-13 ENCOUNTER — Observation Stay: Payer: Medicare Other

## 2020-02-13 ENCOUNTER — Inpatient Hospital Stay
Admission: EM | Admit: 2020-02-13 | Discharge: 2020-02-17 | DRG: 056 | Disposition: A | Discharge status: Home / Self Care | Payer: Medicare Other | Attending: Internal Medicine | Admitting: Internal Medicine

## 2020-02-13 DIAGNOSIS — B9620 Unspecified Escherichia coli [E. coli] as the cause of diseases classified elsewhere: Secondary | ICD-10-CM | POA: Diagnosis present

## 2020-02-13 DIAGNOSIS — Z818 Family history of other mental and behavioral disorders: Secondary | ICD-10-CM | POA: Diagnosis not present

## 2020-02-13 DIAGNOSIS — Z681 Body mass index (BMI) 19 or less, adult: Secondary | ICD-10-CM

## 2020-02-13 DIAGNOSIS — R64 Cachexia: Secondary | ICD-10-CM | POA: Diagnosis present

## 2020-02-13 DIAGNOSIS — Z880 Allergy status to penicillin: Secondary | ICD-10-CM | POA: Diagnosis not present

## 2020-02-13 DIAGNOSIS — Z515 Encounter for palliative care: Secondary | ICD-10-CM

## 2020-02-13 DIAGNOSIS — Z20822 Contact with and (suspected) exposure to covid-19: Secondary | ICD-10-CM | POA: Diagnosis present

## 2020-02-13 DIAGNOSIS — N390 Urinary tract infection, site not specified: Secondary | ICD-10-CM | POA: Diagnosis present

## 2020-02-13 DIAGNOSIS — Z9119 Patient's noncompliance with other medical treatment and regimen: Secondary | ICD-10-CM | POA: Diagnosis not present

## 2020-02-13 DIAGNOSIS — Z4659 Encounter for fitting and adjustment of other gastrointestinal appliance and device: Secondary | ICD-10-CM

## 2020-02-13 DIAGNOSIS — R54 Age-related physical debility: Secondary | ICD-10-CM | POA: Diagnosis present

## 2020-02-13 DIAGNOSIS — Z9114 Patient's other noncompliance with medication regimen: Secondary | ICD-10-CM

## 2020-02-13 DIAGNOSIS — F842 Rett's syndrome: Secondary | ICD-10-CM | POA: Diagnosis not present

## 2020-02-13 DIAGNOSIS — E43 Unspecified severe protein-calorie malnutrition: Secondary | ICD-10-CM | POA: Diagnosis not present

## 2020-02-13 DIAGNOSIS — R569 Unspecified convulsions: Secondary | ICD-10-CM

## 2020-02-13 DIAGNOSIS — Z66 Do not resuscitate: Secondary | ICD-10-CM | POA: Diagnosis not present

## 2020-02-13 DIAGNOSIS — G40909 Epilepsy, unspecified, not intractable, without status epilepticus: Secondary | ICD-10-CM

## 2020-02-13 DIAGNOSIS — Z8349 Family history of other endocrine, nutritional and metabolic diseases: Secondary | ICD-10-CM

## 2020-02-13 DIAGNOSIS — Z888 Allergy status to other drugs, medicaments and biological substances status: Secondary | ICD-10-CM | POA: Diagnosis not present

## 2020-02-13 DIAGNOSIS — Z7189 Other specified counseling: Secondary | ICD-10-CM

## 2020-02-13 DIAGNOSIS — Z151 Genetic susceptibility to epilepsy and neurodevelopmental disorders: Secondary | ICD-10-CM | POA: Diagnosis present

## 2020-02-13 DIAGNOSIS — E876 Hypokalemia: Secondary | ICD-10-CM | POA: Diagnosis not present

## 2020-02-13 DIAGNOSIS — R0902 Hypoxemia: Secondary | ICD-10-CM

## 2020-02-13 DIAGNOSIS — Z0189 Encounter for other specified special examinations: Secondary | ICD-10-CM

## 2020-02-13 LAB — COMPREHENSIVE METABOLIC PANEL
ALT: 29 U/L (ref 0–44)
AST: 39 U/L (ref 15–41)
Albumin: 4.2 g/dL (ref 3.5–5.0)
Alkaline Phosphatase: 56 U/L (ref 38–126)
Anion gap: 10 (ref 5–15)
BUN: 20 mg/dL (ref 6–20)
CO2: 24 mmol/L (ref 22–32)
Calcium: 9.4 mg/dL (ref 8.9–10.3)
Chloride: 108 mmol/L (ref 98–111)
Creatinine, Ser: 0.75 mg/dL (ref 0.44–1.00)
GFR, Estimated: >60 mL/min (ref >60)
Glucose, Bld: 97 mg/dL (ref 70–99)
Potassium: 3.7 mmol/L (ref 3.5–5.1)
Sodium: 142 mmol/L (ref 135–145)
Total Bilirubin: 0.7 mg/dL (ref 0.3–1.2)
Total Protein: 7.9 g/dL (ref 6.5–8.1)

## 2020-02-13 LAB — CBC WITH DIFFERENTIAL/PLATELET
Abs Immature Granulocytes: 0.02 10*3/uL (ref 0.00–0.07)
Basophils Absolute: 0.1 10*3/uL (ref 0.0–0.1)
Basophils Relative: 1 %
Eosinophils Absolute: 0 10*3/uL (ref 0.0–0.5)
Eosinophils Relative: 1 %
HCT: 42.9 % (ref 36.0–46.0)
Hemoglobin: 13.7 g/dL (ref 12.0–15.0)
Immature Granulocytes: 0 %
Lymphocytes Relative: 27 %
Lymphs Abs: 1.9 10*3/uL (ref 0.7–4.0)
MCH: 27.5 pg (ref 26.0–34.0)
MCHC: 31.9 g/dL (ref 30.0–36.0)
MCV: 86 fL (ref 80.0–100.0)
Monocytes Absolute: 0.5 10*3/uL (ref 0.1–1.0)
Monocytes Relative: 7 %
Neutro Abs: 4.7 10*3/uL (ref 1.7–7.7)
Neutrophils Relative %: 64 %
Platelets: 126 10*3/uL — ABNORMAL LOW (ref 150–400)
RBC: 4.99 MIL/uL (ref 3.87–5.11)
RDW: 16.4 % — ABNORMAL HIGH (ref 11.5–15.5)
WBC: 7.3 10*3/uL (ref 4.0–10.5)
nRBC: 0 % (ref 0.0–0.2)

## 2020-02-13 LAB — RESPIRATORY PANEL BY RT PCR (FLU A&B, COVID)
Influenza A by PCR: NEGATIVE
Influenza B by PCR: NEGATIVE
SARS Coronavirus 2 by RT PCR: NEGATIVE

## 2020-02-13 MED ORDER — LEVETIRACETAM IN NACL 500 MG/100ML IV SOLN
500.0000 mg | Freq: Two times a day (BID) | INTRAVENOUS | Status: DC
  Filled 2020-02-13 (×2): qty 100

## 2020-02-13 MED ORDER — LORAZEPAM 2 MG/ML IJ SOLN
2.0000 mg | Freq: Once | INTRAMUSCULAR | Status: AC
  Administered 2020-02-13: 2 mg via INTRAVENOUS

## 2020-02-13 MED ORDER — SODIUM CHLORIDE 0.9 % IV SOLN
75.0000 mL/h | INTRAVENOUS | Status: DC
  Administered 2020-02-13: 75 mL/h via INTRAVENOUS

## 2020-02-13 MED ORDER — OXCARBAZEPINE 300 MG PO TABS
600.0000 mg | ORAL_TABLET | Freq: Two times a day (BID) | ORAL | Status: DC
  Administered 2020-02-14 (×2): 600 mg via ORAL
  Filled 2020-02-13 (×4): qty 2

## 2020-02-13 MED ORDER — TIAGABINE HCL 2 MG PO TABS
4.0000 mg | ORAL_TABLET | Freq: Two times a day (BID) | ORAL | Status: DC
  Administered 2020-02-14 (×2): 4 mg via ORAL
  Filled 2020-02-13 (×5): qty 2

## 2020-02-13 MED ORDER — DEXTROSE IN LACTATED RINGERS 5 % IV SOLN: INTRAVENOUS | Status: DC

## 2020-02-13 MED ORDER — LEVETIRACETAM IN NACL 1500 MG/100ML IV SOLN
1500.0000 mg | Freq: Once | INTRAVENOUS | Status: AC
  Administered 2020-02-13: 1500 mg via INTRAVENOUS
  Filled 2020-02-13: qty 100

## 2020-02-13 MED ORDER — ENOXAPARIN SODIUM 40 MG/0.4ML ~~LOC~~ SOLN
20.0000 mg | SUBCUTANEOUS | Status: DC
  Administered 2020-02-14 – 2020-02-17 (×4): 20 mg via SUBCUTANEOUS
  Filled 2020-02-13 (×5): qty 0.4

## 2020-02-13 MED ORDER — LORAZEPAM 2 MG/ML IJ SOLN
INTRAMUSCULAR | Status: AC
  Administered 2020-02-13: 2 mg
  Filled 2020-02-13: qty 1

## 2020-02-13 MED ORDER — SODIUM CHLORIDE 0.9 % IV SOLN: 2000.0000 mg | Freq: Once | INTRAVENOUS | Status: DC

## 2020-02-13 MED ORDER — PANTOPRAZOLE SODIUM 40 MG PO TBEC
40.0000 mg | DELAYED_RELEASE_TABLET | Freq: Every day | ORAL | Status: DC
  Filled 2020-02-13: qty 1

## 2020-02-13 MED ORDER — FLUOXETINE HCL 10 MG PO CAPS
10.0000 mg | ORAL_CAPSULE | Freq: Every day | ORAL | Status: DC
  Administered 2020-02-14 (×2): 10 mg via ORAL
  Filled 2020-02-13 (×2): qty 1

## 2020-02-13 MED ORDER — LACTATED RINGERS IV BOLUS
1000.0000 mL | Freq: Once | INTRAVENOUS | Status: AC
  Administered 2020-02-13: 1000 mL via INTRAVENOUS

## 2020-02-13 MED ORDER — PHENYTOIN SODIUM 50 MG/ML IJ SOLN: 100.0000 mg | Freq: Two times a day (BID) | INTRAMUSCULAR | Status: DC

## 2020-02-13 MED ORDER — LORAZEPAM 2 MG/ML IJ SOLN
1.0000 mg | Freq: Two times a day (BID) | INTRAMUSCULAR | Status: DC
  Administered 2020-02-14 – 2020-02-15 (×5): 1 mg via INTRAVENOUS
  Filled 2020-02-13 (×5): qty 1

## 2020-02-13 NOTE — Progress Notes (Signed)
Unable to obtain abg. Patient not able to understand what I am attempting to do she she jerks away. I fear possibly breaking her arm while attempting to restrain her arm and sticking for abg at same time. Patient is tolerating bipap well as her stats are 100% with volumes 350-400. She is not fighting bipap but resisting needle stick

## 2020-02-13 NOTE — Consult Note (Addendum)
MEDICATION RELATED CONSULT NOTE   Pharmacy Consult for: Drug interaction and monitoring of antiepileptic medications   Allergies  Allergen Reactions  . Benadryl [Diphenhydramine]   . Penicillins Other (See Comments)    Has patient had a PCN reaction causing immediate rash, facial/tongue/throat swelling, SOB or lightheadedness with hypotension: Unknown Has patient had a PCN reaction causing severe rash involving mucus membranes or skin necrosis: Unknown Has patient had a PCN reaction that required hospitalization: Unknown Has patient had a PCN reaction occurring within the last 10 years: Unknown If all of the above answers are "NO", then may proceed with Cephalosporin use.    Medical History: Past Medical History:  Diagnosis Date  . Dystonia   . Rett syndrome   . Seizures (HCC)     Medications:  - Oxcarbazepine  - Tiagabine   Assessment: Pharmacy has been consulted to assess and monitor for drug interactions of antiepileptic medications. Currently there are no drug interactions. Per notes, patient has had multiple seizures today lasting 3-6 minutes. Notable patient's father has been taking of her but he has dementia- caregiver unsure if patient has been regularly getting medications. Patient is being followed by neurology and Keppra IV 1500 mg x1 dose.   Presently, there are no drug interactions with antiepileptic medications.    Plan:   Pharmacy will continue to monitor for drug interactions.   May consider checking oxcarbazepine level due to possible non-compliance, if clinically appropriate.   Katha Cabal 02/13/2020,7:05 PM

## 2020-02-13 NOTE — ED Notes (Signed)
Transported to CT 

## 2020-02-13 NOTE — ED Provider Notes (Signed)
[facility] Emergency Department Provider Note   ____________________________________________   First MD Initiated Contact with Patient 02/13/20 1543     (approximate)  I have reviewed the triage vital signs and the nursing notes.   HISTORY  Chief Complaint Seizures   HPI Alicia Duncan is a 44 y.o. female with past medical history of Rett syndrome, seizures, and dystonia who presents to the ED for seizure.  History is limited as patient is nonverbal at baseline and caregiver not currently present.  EMS reports they were told by caregiver patient has had multiple seizures over the past 45 minutes, each lasting between 3 to 6 minutes.  EMS did not note any seizure activity during transport and caregiver reported she was currently at her neurologic baseline.  She is nonverbal at baseline and has difficulty following commands.  Her caregiver apparently had been away for the past 2 days and was concerned that the patient's father, who she reported has dementia, had not been giving the patient her medications.  Patient currently takes Ativan, oxcarbazepine, and Gabitril for seizure control.  Caregiver was also concerned for UTI given strong odor to urine.        Past Medical History:  Diagnosis Date  . Dystonia   . Rett syndrome   . Seizures [facility])     Patient Active Problem List   Diagnosis Date Noted  . Protein-calorie malnutrition, severe 12/20/2019  . Breakthrough seizure (HCC) 12/18/2019  . Cough 05/13/2016  . Pancytopenia (HCC) 05/13/2016  . Iron deficiency anemia 05/13/2016  . SIRS (systemic inflammatory response syndrome) (HCC) 05/11/2016  . Dental infection 05/11/2016  . 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 Seizure disorder (HCC) 05/11/2016  . Sepsis (HCC) 05/11/2016  . Seizure (HCC) 04/25/2013    No past surgical history on file.  Prior to Admission medications   Medication Sig Start Date End Date Taking? Authorizing Provider  baclofen  (LIORESAL) 10 MG tablet Take 10 mg by mouth 3 (three) times daily.    Yes [provider]  FLUoxetine (PROZAC) 10 MG capsule Take 10 mg by mouth daily. 10/28/19  Yes [provider]  LORazepam (ATIVAN) 1 MG tablet Take 1 tablet (1 mg total) by mouth 2 (two) times daily. 09/14/19  Yes York Spaniel, MD  omeprazole (PRILOSEC) 20 MG capsule Take 20 mg by mouth daily.   Yes [provider]  Oxcarbazepine (TRILEPTAL) 300 MG tablet TAKE 2 TABLETS (600 MG TOTAL) BY MOUTH 2 (TWO) TIMES DAILY. 01/04/20  Yes York Spaniel, MD  tiaGABine (GABITRIL) 4 MG tablet TAKE 1 TABLET (4 MG TOTAL) BY MOUTH 2 (TWO) TIMES DAILY. Patient taking differently: Take 4 mg by mouth 2 (two) times daily.  01/19/20  Yes York Spaniel, MD    Allergies Benadryl [diphenhydramine] and Penicillins  Family History  Problem Relation Age of Onset  . Seizures Mother   . Memory loss Father   . Diverticulosis Father   . Schizophrenia Brother     Social History Social History   Tobacco Use  . Smoking status: Never Smoker  . Smokeless tobacco: Never Used  Substance Use Topics  . Alcohol use: No  . Drug use: No    Review of Systems Unable to obtain secondary to patient nonverbal  ____________________________________________   PHYSICAL EXAM:  VITAL SIGNS: ED Triage Vitals  Enc Vitals Group     BP      Pulse      Resp      Temp  Temp src      SpO2      Weight      Height      Head Circumference      Peak Flow      Pain Score      Pain Loc      Pain Edu?      Excl. in GC?     Constitutional: Awake and alert. Eyes: Conjunctivae are normal.  Pupils equal round and reactive to light bilaterally. Head: Atraumatic. Nose: No congestion/rhinnorhea. Mouth/Throat: Mucous membranes are moist. Neck: Normal ROM Cardiovascular: Tachycardic, regular rhythm. Grossly normal heart sounds. Respiratory: Normal respiratory effort.  No retractions. Lungs CTAB. Gastrointestinal: Soft  and nontender. No distention. Genitourinary: deferred Musculoskeletal: No lower extremity tenderness nor edema. Neurologic: Nonverbal at baseline.  Patient tremulous throughout with gaze to the right. Skin:  Skin is warm, dry and intact. No rash noted. Psychiatric: Mood and affect are normal. Speech and behavior are normal.  ____________________________________________   LABS (all labs ordered are listed, but only abnormal results are displayed)  Labs Reviewed  CBC WITH DIFFERENTIAL/PLATELET - Abnormal; Notable for the following components:      Result Value   RDW 16.4 (*)    Platelets 126 (*)    All other components within normal limits  RESPIRATORY PANEL BY RT PCR (FLU A&B, COVID)  COMPREHENSIVE METABOLIC PANEL  URINALYSIS, COMPLETE (UACMP) WITH MICROSCOPIC  MISC [facility] TEST (SEND OUT)  CBC   ____________________________________________  EKG  ED ECG REPORT I, Chesley Noon, the attending physician, personally viewed and interpreted this ECG.   Date: 02/13/2020  EKG Time: 16:20  Rate: 97  Rhythm: normal EKG, normal sinus rhythm, unchanged from previous tracings  Axis: Normal  Intervals:none  ST&T Change: None   PROCEDURES  Procedure(s) performed (including Critical Care):  Procedures   ____________________________________________   INITIAL IMPRESSION / ASSESSMENT AND PLAN / ED COURSE       44 year old female with past medical history of Rett syndrome, seizures, and dystonia who presents to the ED for multiple reported seizures at home.  On arrival, she appears to have gaze preference to the right with diffuse shaking that could represent recurrent seizure activity although patient reportedly has some shaking activity at baseline.  She was given 2 mg of Ativan with improvement, now coughing and moving all extremities purposefully, although she continues to have significant tremors, especially in her right upper extremity.  Dr. Amada Jupiter of neurology saw the  patient in consultation, recommends additional 2 mg of Ativan and loading with Keppra.  Plan will be to treat patient with her usual seizure medications as long as she does not have any clear generalized seizure activity.  Labs and UA are pending at this time, will also check chest x-ray given her apparent cough.  Lab work unremarkable, unfortunately obtaining urine sample has been difficult despite multiple attempts at In-N-Out catheterization.  Patient increasingly somnolent after receiving Ativan and Keppra, has had episodes of respiratory depression requiring stimulation and supplemental oxygen via nasal cannula.  We have now been able to wean patient off of oxygen and she is maintaining her saturations on room air.  Case discussed with hospitalist for admission, who requests head CT, which was performed and negative for acute process.      ____________________________________________   FINAL CLINICAL IMPRESSION(S) / ED DIAGNOSES  Final diagnoses:  Seizure (HCC)  Rett syndrome     ED Discharge Orders    None       Note:  This document was prepared using Dragon voice recognition software and may include unintentional dictation errors.   Chesley Noon, MD 02/13/20 [phone]

## 2020-02-13 NOTE — Consult Note (Signed)
Neurology Consultation Reason for Consult: Seizures Referring Physician: Chesley Noon  CC: Seizures  History is obtained from: Chart review  HPI: Alicia Duncan is a 44 y.o. female with a history of rhett sybndrom eand intractable epilepsy who presents with recurrent seizures today. She has "myoclonic like movements" that do not impair her daily life as well as seizures. She had several witnessed seizures earlier prompting family to bring her to the ER today. There is question of whether she may have missed doses of her medications due to her caregiver being away, and her father having dementia. She had another seizure after arrival to the ER and was given ativan.    ROS: Unable to obtain due to altered mental status.   Past Medical History:  Diagnosis Date  . Dystonia   . Rett syndrome   . Seizures (HCC)     Family History  Problem Relation Age of Onset  . Seizures Mother   . Memory loss Father   . Diverticulosis Father   . Schizophrenia Brother      Social History:  reports that she has never smoked. She has never used smokeless tobacco. She reports that she does not drink alcohol and does not use drugs.   Exam: Current vital signs: BP (!) 144/129 (BP Location: Right Arm)   Pulse 80   Temp (!) 96.3 F (35.7 C) (Rectal)   Resp 10   SpO2 100%  Vital signs in last 24 hours: Temp:  [96.3 F (35.7 C)] 96.3 F (35.7 C) (11/08 1656) Pulse Rate:  [80-92] 80 (11/08 1700) Resp:  [10-29] 10 (11/08 1700) BP: (144)/(129) 144/129 (11/08 1620) SpO2:  [100 %] 100 % (11/08 1700)   Physical Exam  Constitutional: Appears chronically ill Psych: does not answer questions Eyes: No scleral injection HENT: No OP obstruction MSK: no joint deformities.  Cardiovascular: Normal rate and regular rhythm.  Respiratory: Effort normal, non-labored breathing GI: Soft.  No distension. There is no tenderness.  Skin: WDI  Neuro: Mental Status: Patient is awake, and she fixates on the  examiner, but does not clearly follow commands(nonverbal at baseline) Cranial Nerves: II: fixates and tracks, but does not clearly blink to threat. Pupils are equal, round, and reactive to light.   III,IV, VI: crosses midlien in both dricections.  V:VII: blinks to eyelid stimulation x 2  VIII: hearing is intact to voice X: Uvula elevates symmetrically XI: Shoulder shrug is symmetric. XII: tongue is midline without atrophy or fasciculations.  Motor: She has increased tone, with some subtle clonic activity of the RUE. She has purposeful movements of all extremities including the RUE.  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 She does not appear to be seizing, though I am concerned about her right arm movements. I would not pursue this aggressively, but she may benefit from ativan/keppra load. I would restart her home meds and would not hold them, even if NG is needed.  Recommendations: 1) Keppra 1500mg  x 1, ativan 2mg  x 1 2) continue home gabatril 4mg  BID 3) Continue home trileptal 600mg  BID(check level) 4) continue home ativan 1mg  bid 5) EEG routine in the AM   , MD [facility] [phone]  If 7pm- 7am, please page neurology on call as listed in [place].

## 2020-02-13 NOTE — ED Notes (Signed)
Pt

## 2020-02-13 NOTE — ED Notes (Addendum)
Tried to wake pt up to give her meds. Pt is not alert to verbal stimuli. She is moving extremeties with painful stimul, but definitely not alert enough to take meds PO. Pt does not have corneal reflex, but pupils are reactive to light.   Steward Drone NP notified and at bedside   Esperanza Heir NP at bedside, pt O2 sat dropped down in the 40s. Gracie RN bagged pt.  Respiratory called and pt placed on Bipap. O2 sat are now 100%.

## 2020-02-13 NOTE — ED Notes (Addendum)
This RN at bedside. Pt oxygen sats low 80s. Pt sleeping. Pt placed on 2L [place]. Sats improved 100%. No seizure activity noted. MD made aware.

## 2020-02-13 NOTE — Progress Notes (Signed)
Cross cover  approximately 2130 RN messaged me with concerns regarding patients inability to swallow and giving antiseizure meds. At bedside sats noted to be in 70's and continued to decline requiring BVM ventilation to improve oxygenation.  She was placed on bipap and sats 94-95% with good lung volumes. Initiall appearance patient using with abdominal breathing and chest expansion looked asymmetrical with decreased air movement throughout right with crackles throughout.  After BVM and bipap placement, she had course crackle throughout and better air movement in right.  Findings possibly just related to her deformities, or other differential, acute aspiration, mucous plug, PE , apnea. Checking D dimer.  Unable to obtain ABG due to patient fighting procedure. Decreasing support to high flow .  If tolerates, nursing instructed to place NGT so her oral antiseizure meds can be given. If this can not be achieved will need to reeval IV meds. Initial use of keppra not recommended by neurologist so if needed will utilize dilantin

## 2020-02-13 NOTE — Progress Notes (Signed)
Per NP Jon Billings, trial to high flow cannula.  Patient on 6 liters tolerating well.  bipap sb

## 2020-02-13 NOTE — ED Notes (Signed)
This RN, Student RN and Delsa Grana all attempted in and out catheter without success. Purewick placed on pt. MD made aware.

## 2020-02-13 NOTE — ED Triage Notes (Addendum)
Pt to ED via [facility] from home. Per EMS pt has regular caregiver that has been away x2-3days. Caregiver stating pt's father has been caring for her but he has dementia. Caregiver tating she is unsure if pt received regular medications or has been fed or cleaned. Caregiver stating pt has been having multiple seizures today lasting 3-6 mins.   Upon arrival pt started having seizure lasting approx . Strong ammonia smell present. 2mg  ativan given per MD verbal order.

## 2020-02-13 NOTE — Progress Notes (Signed)
Brief note, full consult note to follow:  Multiple seizure sin the setting of possible non-compliance.   She has purposeful movement bilaterally, but some persistent RUE clonic like movements.   Will give additional ativan, load with IV keppra. I would ensure that she gets her evening medications, whether through NG tube placement or orally. I would not continue keppra long term at this point.   Ritta Slot, MD [facility] [phone]  If 7pm- 7am, please page neurology on call as listed in AMION.

## 2020-02-13 NOTE — H&P (Signed)
History and Physical   Alicia Duncan LOV:[id number] DOB: Mar 30, 1976 DOA: 02/13/2020  PCP: [name], MD  Outpatient Specialists: [facility],  Patient coming from: home  I have personally briefly reviewed patient's old medical records in [facility] EMR.  Chief Concern: seizures  HPI: Alicia Duncan is a 44 y.o. female with medical history significant for seizure.  Mr. [name] states that Faria developed seizures at approximately 9 AM, for approximately 1 hour.   I spoke with Mr. [name] that I was the doctor admitting Alicia Duncan into the hospital.  And I  updated him regarding Ms. [name] current status and that she is sleeping and not having seizures.  I ask Mr. [name] if the patient has been given her seizure medications daily.  He reports that he thinks so however he is not sure as the sitter gives the medications everyday.  When I ask if the sitter comes every day in order to give the patient her medications, Mr. [name] states that his memory does not allow him to think that far back.  He said "I am sure she does come every day" and "I am sure that Alicia Duncan gets her medicines everyday."   When I asked if a sitter does not come, who gives Alicia Duncan her medications. Mr. [name] states, "I go to the place, and we give [name] her medications." Mr. [name] was not able to tell me where the place is that he obtains the Patient's medications.   Social history: lives with father who is primary guardian and caregiver  ED Course: discussed with ED provider.  Per ED provider, Alicia Duncan has a Comptroller that comes regularly to help give her medications.  However ED provider states that a sitter has been out for the last 2 days and her father taking care of her.  It is unclear whether or not Alicia Duncan received her seizure medications.  ED provider states that patient was seizing for 45 minutes, when father called EMS.  CT head w/o contrast ordered to assess for  acute abnormality.   Review of Systems: unable to obtain as patient has mutism  Assessment/Plan  Principal Problem:   Seizure [facility]) Active Problems:   Rett syndrome   Seizure disorder (HCC)   Protein-calorie malnutrition, severe   Seizure-in setting of established seizure disorder -Etiology is unclear at this time however suspect secondary to difficulty with medication administration -Status post loading dose of Keppra at 1500 mg IV in the emergency department, and 2 doses of 2 mg Ativan has been given -Neurologist has been consulted and recommends resumption of home seizure medications and will follow - EEG in the AM ordered per neurologist recommendation -Oxcarbamazepine 600 mg p.o. twice daily, tiagabine 4 mg PO VID have been resumed -Nursing instruction until discontinued: 'Please ensure [name] gets her evening medications. If she can not take PO, please place NG tube. If NG tube needs to be placed, please let provider know so that I can order cxr to ensure gastric placement. Thank you.' -P.o. 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 -Diet: Dysphagia 1 with thin liquid via baby feeding bottle has been ordered  DVT prophylaxis: Enoxaparin Code Status: Full code Diet: Dysphagia 1 with  thin liquids via the baby feeding bottle Family Communication: Called to elderly father, Alicia Duncan Disposition Plan: pending neurology recommendation Consults called: neurology Admission status: observation with telemetry  Past Medical History:  Diagnosis Date  . Dystonia   . Rett syndrome   . Seizures (HCC)    No past surgical history on file.  Social History:  reports that she has never smoked. She has never used smokeless tobacco. She reports that she does not drink alcohol and does not use drugs.  Allergies  Allergen Reactions  . Benadryl [Diphenhydramine]   . Penicillins Other (See Comments)    Has patient had a PCN reaction causing immediate rash, facial/tongue/throat swelling, SOB or lightheadedness with hypotension: Unknown Has patient had a PCN reaction causing severe rash involving mucus membranes or skin necrosis: Unknown Has patient had a PCN reaction that required hospitalization: Unknown Has patient had a PCN reaction occurring within the last 10 years: Unknown If all of the above answers are "NO", then may proceed with Cephalosporin use.    Family History  Problem Relation Age of Onset  . Seizures Mother   . Memory loss Father   . Diverticulosis Father   . Schizophrenia Brother    Family history: Family history reviewed and not pertinent  Prior to Admission medications   Medication Sig Start Date End Date Taking? Authorizing Provider  baclofen (LIORESAL) 10 MG tablet Take 10 mg by mouth 3 (three) times daily.     [provider]  FLUoxetine (PROZAC) 10 MG capsule Take 10 mg by mouth daily. 10/28/19   [provider]  LORazepam (ATIVAN) 1 MG tablet Take 1 tablet (1 mg total) by mouth 2 (two) times daily. 09/14/19   York Spaniel, MD  omeprazole (PRILOSEC) 20 MG capsule Take 20 mg by mouth daily.    [provider]  Oxcarbazepine (TRILEPTAL) 300 MG tablet TAKE 2 TABLETS (600 MG TOTAL) BY MOUTH 2 (TWO) TIMES DAILY. 01/04/20   York Spaniel, MD  tiaGABine (GABITRIL) 4 MG tablet TAKE 1 TABLET (4 MG TOTAL) BY MOUTH 2 (TWO) TIMES DAILY. 01/19/20   York Spaniel, MD   Physical Exam: Vitals:   02/13/20 1700 02/13/20 1730 02/13/20 1815 02/13/20 1845  BP:  119/68 (!) 122/107   Pulse: 80 63 70 74  Resp: 10 15 16 16   Temp:      TempSrc:      SpO2: 100% 100% 93% 99%   Constitutional: appears cachectic, frail, NAD, calm, comfortable Eyes: PERRL, lids and conjunctivae normal ENMT: Mucous membranes are moist. Posterior pharynx clear of any exudate or lesions. poor dentition. Neck: normal, supple, no masses, no thyromegaly Respiratory: clear to auscultation bilaterally, no wheezing, no crackles. Normal respiratory effort. No accessory muscle use.  Cardiovascular: Regular rate and rhythm, no murmurs / rubs / gallops. No extremity edema. 2+ pedal pulses. No carotid bruits.  Abdomen: no tenderness, no masses palpated, no hepatosplenomegaly. Bowel sounds positive.  Musculoskeletal: underdeveloped, no clubbing / cyanosis. no contractures, diffused extremity atrophy. Skin: no rashes, lesions, ulcers. No induration Neurologic: PERRL, response minimally with sternal rub, neurologic response is appropriate for patient is sleeping after seizure and receiving keppra and  ativan Psychiatric: unable to assess as patient is sleeping   EKG: Independently reviewed, showing sinus rhythm rate of 97  Chest x-ray on Admission: Personally reviewed and I agree with radiologist reading as below.  CT Head Wo Contrast  Result Date: 02/13/2020 CLINICAL DATA:  44 year old female status post multiple seizures today. EXAM: CT HEAD WITHOUT CONTRAST TECHNIQUE: Contiguous axial images were obtained from the base of the skull through the vertex without intravenous contrast. COMPARISON:  Head CT 12/17/2019 and earlier. FINDINGS: Brain: Stable chronic cerebral volume loss and mildly asymmetric subarachnoid CSF along the right frontal convexity. No midline  shift, ventriculomegaly, mass effect, evidence of mass lesion, intracranial hemorrhage or evidence of cortically based acute infarction. Gray-white matter differentiation is within normal limits throughout the brain. Vascular: No suspicious intracranial vascular hyperdensity. Skull: Stable visualized osseous structures. Sinuses/Orbits: Visualized paranasal sinuses and mastoids are stable and well pneumatized. Other: Visualized orbits and scalp soft tissues are within normal limits. IMPRESSION: Stable noncontrast CT appearance of the brain, negative aside from chronic volume loss Electronically Signed   By: Odessa Fleming M.D.   On: 02/13/2020 18:19   DG Chest Portable 1 View  Result Date: 02/13/2020 CLINICAL DATA:  Cough, seizures EXAM: PORTABLE CHEST 1 VIEW COMPARISON:  12/17/2019 FINDINGS: Single frontal view of the chest demonstrates a stable cardiac silhouette. Chronic scarring throughout the lungs without airspace disease, effusion, or pneumothorax. Stable postsurgical changes from spinal fusion. IMPRESSION: 1. Stable exam, no acute process. Electronically Signed   By: Sharlet Salina M.D.   On: 02/13/2020 16:41    Labs on Admission: I have personally reviewed following labs  CBC: Recent Labs  Lab 02/13/20 1614  WBC 7.3  NEUTROABS 4.7  HGB 13.7  HCT 42.9  MCV 86.0  PLT 126*   Basic Metabolic Panel: Recent Labs  Lab 02/13/20 1614  NA 142  K 3.7  CL 108  CO2 24  GLUCOSE 97  BUN 20  CREATININE 0.75  CALCIUM 9.4   GFR: CrCl cannot be calculated (Unknown ideal weight.). Liver Function Tests: Recent Labs  Lab 02/13/20 1614  AST 39  ALT 29  ALKPHOS 56  BILITOT 0.7  PROT 7.9  ALBUMIN 4.2   Urine analysis:    Component Value Date/Time   COLORURINE AMBER (A) 11/14/2019 2039   APPEARANCEUR TURBID (A) 11/14/2019 2039   LABSPEC 1.024 11/14/2019 2039   PHURINE 7.0 11/14/2019 2039   GLUCOSEU >=500 (A) 11/14/2019 2039   HGBUR NEGATIVE 11/14/2019 2039   BILIRUBINUR NEGATIVE  11/14/2019 2039   KETONESUR NEGATIVE 11/14/2019 2039   PROTEINUR NEGATIVE 11/14/2019 2039   UROBILINOGEN 1.0 04/25/2013 1427   NITRITE NEGATIVE 11/14/2019 2039   LEUKOCYTESUR NEGATIVE 11/14/2019 2039   Asyria Kolander N Roshan Salamon D.O. [facility]  If 12AM-7AM, please contact overnight-coverage provider If 7AM-7PM, please contact day coverage provider [email or web address]  02/13/2020, 7:24 PM

## 2020-02-14 ENCOUNTER — Inpatient Hospital Stay: Payer: Medicare Other

## 2020-02-14 ENCOUNTER — Encounter: Payer: Self-pay | Admitting: Internal Medicine

## 2020-02-14 DIAGNOSIS — F842 Rett's syndrome: Principal | ICD-10-CM

## 2020-02-14 DIAGNOSIS — R569 Unspecified convulsions: Secondary | ICD-10-CM

## 2020-02-14 DIAGNOSIS — E43 Unspecified severe protein-calorie malnutrition: Secondary | ICD-10-CM

## 2020-02-14 LAB — PHOSPHORUS: Phosphorus: 2.5 mg/dL (ref 2.5–4.6)

## 2020-02-14 LAB — URINALYSIS, COMPLETE (UACMP) WITH MICROSCOPIC
Bilirubin Urine: NEGATIVE
Glucose, UA: NEGATIVE mg/dL
Hgb urine dipstick: NEGATIVE
Ketones, ur: 5 mg/dL — AB
Leukocytes,Ua: NEGATIVE
Nitrite: POSITIVE — AB
Protein, ur: NEGATIVE mg/dL
Specific Gravity, Urine: 1.023 (ref 1.005–1.030)
pH: 5 (ref 5.0–8.0)

## 2020-02-14 LAB — CBC
HCT: 39.3 % (ref 36.0–46.0)
Hemoglobin: 12.1 g/dL (ref 12.0–15.0)
MCH: 27.4 pg (ref 26.0–34.0)
MCHC: 30.8 g/dL (ref 30.0–36.0)
MCV: 89.1 fL (ref 80.0–100.0)
Platelets: 97 10*3/uL — ABNORMAL LOW (ref 150–400)
RBC: 4.41 MIL/uL (ref 3.87–5.11)
RDW: 16 % — ABNORMAL HIGH (ref 11.5–15.5)
WBC: 9.2 10*3/uL (ref 4.0–10.5)
nRBC: 0 % (ref 0.0–0.2)

## 2020-02-14 LAB — GLUCOSE, CAPILLARY
Glucose-Capillary: 107 mg/dL — ABNORMAL HIGH (ref 70–99)
Glucose-Capillary: 107 mg/dL — ABNORMAL HIGH (ref 70–99)
Glucose-Capillary: 90 mg/dL (ref 70–99)
Glucose-Capillary: 96 mg/dL (ref 70–99)
Glucose-Capillary: 87 mg/dL (ref 70–99)
Glucose-Capillary: 99 mg/dL (ref 70–99)
Glucose-Capillary: 82 mg/dL (ref 70–99)

## 2020-02-14 LAB — FIBRIN DERIVATIVES D-DIMER (ARMC ONLY): Fibrin derivatives D-dimer (ARMC): 742.86 ng/mL (FEU) — ABNORMAL HIGH (ref 0.00–499.00)

## 2020-02-14 LAB — MAGNESIUM
Magnesium: 1.9 mg/dL (ref 1.7–2.4)
Magnesium: 1.8 mg/dL (ref 1.7–2.4)

## 2020-02-14 LAB — MRSA PCR SCREENING: MRSA by PCR: NEGATIVE

## 2020-02-14 MED ORDER — OSMOLITE 1.2 CAL PO LIQD
1000.0000 mL | ORAL | Status: DC
  Administered 2020-02-14: 1000 mL

## 2020-02-14 MED ORDER — VITAL HIGH PROTEIN PO LIQD: 1000.0000 mL | ORAL | Status: DC

## 2020-02-14 MED ORDER — PANTOPRAZOLE SODIUM 40 MG IV SOLR
40.0000 mg | Freq: Every day | INTRAVENOUS | Status: DC
  Administered 2020-02-14 – 2020-02-15 (×2): 40 mg via INTRAVENOUS
  Filled 2020-02-14 (×2): qty 40

## 2020-02-14 MED ORDER — OXCARBAZEPINE 300 MG PO TABS
600.0000 mg | ORAL_TABLET | Freq: Two times a day (BID) | ORAL | Status: DC
  Administered 2020-02-15 (×3): 600 mg
  Filled 2020-02-14 (×5): qty 2

## 2020-02-14 MED ORDER — TIAGABINE HCL 4 MG PO TABS
4.0000 mg | ORAL_TABLET | Freq: Two times a day (BID) | ORAL | Status: DC
  Administered 2020-02-15 (×3): 4 mg
  Filled 2020-02-14 (×2): qty 2
  Filled 2020-02-14 (×2): qty 1
  Filled 2020-02-14 (×2): qty 2
  Filled 2020-02-14: qty 1

## 2020-02-14 MED ORDER — FLUOXETINE HCL 10 MG PO CAPS
10.0000 mg | ORAL_CAPSULE | Freq: Every day | ORAL | Status: DC
  Administered 2020-02-15: 10 mg
  Filled 2020-02-14 (×2): qty 1

## 2020-02-14 MED ORDER — SODIUM CHLORIDE 0.9 % IV BOLUS
250.0000 mL | Freq: Once | INTRAVENOUS | Status: AC
  Administered 2020-02-14: 250 mL via INTRAVENOUS

## 2020-02-14 MED ORDER — CHLORHEXIDINE GLUCONATE CLOTH 2 % EX PADS
6.0000 | MEDICATED_PAD | Freq: Every day | CUTANEOUS | Status: DC
  Administered 2020-02-14 – 2020-02-17 (×2): 6 via TOPICAL

## 2020-02-14 NOTE — Progress Notes (Addendum)
Initial Nutrition Assessment  DOCUMENTATION CODES:   Severe malnutrition in context of social or environmental circumstances, Underweight  INTERVENTION:  Plan is for initiation of tube feeds today: -Initiate Osmolite 1.2 Cal at 15 mL/hr and advance by 15 mL/hr every 12 hours to goal rate of 45 mL/hr -Provides 1296 kcal, 60 grams of protein, 886 mL H2O daily -Provide minimum free water flush of 20-30 mL Q4hrs to maintain tube patency -If IV fluids are discontinued recommend free water flush of 50 mL Q4hrs to meet hydration needs (would provide total of 1186 mL H2O daily including water in tube feeding)  Monitor magnesium, potassium, and phosphorus daily for at least 3 days, MD to replete as needed, as pt is at risk for refeeding syndrome given severe malnutrition.  NUTRITION DIAGNOSIS:   Severe Malnutrition related to social / environmental circumstances (suspected inadequate oral intake) as evidenced by severe fat depletion, severe muscle depletion, 19.3% weight loss over 3 months.  GOAL:   Patient will meet greater than or equal to 90% of their needs  MONITOR:   PO intake, Labs, Weight trends, TF tolerance, I & O's  REASON FOR ASSESSMENT:   Consult Assessment of nutrition requirement/status, Enteral/tube feeding initiation and management  ASSESSMENT:   44 year old female with PMHx of Rett syndrome, seizures admitted with breakthrough seizures.   Met with patient and her father at bedside. Patient known to this RD from previous assessment in 05/2016. Patient is nonverbal so unable to provide any history. In 2018 patient's father was a good historian and was able to provide information on PO intake. Patient's father is more confused today and is unable to provide much information regarding patient's usual intake at home. Patient is edentulous (last remaining teeth extracted in 2018). She eats very little but may have a meal at dinner time [employer] or baby food). He  reports she also likes flavored yogurt, pudding, and cream of wheat. Patient drinks Ensure 2-3 times per day. Father unable to endorse which type of Ensure it is, but in 2018 it was Ensure Plus. Patient will also drink cranberry grape juice, other fruit juice, or water some between meals. Patient drinks liquid from baby bottle. During recent admission patient was recommended to be on puree diet with thin liquids by SLP. She is currently ordered for this diet but is unable to take PO at this time. When RD was in room assessing patient RN attempted to see if patient would accept small spoonful of applesauce but patient shook head and did not accept.  Patient has NGT that was placed for medication administration. Discussed with MD via secure chat and plan is to initiate tube feeds.  Patient's father unable to provide any information on weight history today. Per conversation in 2018 patient's UBW was 85 lbs and she had lost down to 78.5 lbs. Patient is now only 29.3 kg (64.6 lbs). Per chart she was 36.3 kg on 11/14/2019. That is a weight loss of 7 kg (19.3% body weight) over the past 3 months, which is significant for time frame.  Per literature review, patients with Rett syndrome typically experience poor growth. In the pediatric population, a good clinical guideline for growth is the 25th percentile for BMI-for-age. Other than that, it is recommended to estimate calorie needs above recommendations for weight until patient is normal body weight (BMI >/= 18.5). Per this guideline, a good weight goal for patient would be approximately 44.4 kg (97.68 lbs).  Medications reviewed and include: Ativan,  Protonix, D5LR at 50 mL/hr.  Labs reviewed: CBG 96-107.  Enteral Access: 63 Fr. NGT placed 11/8; terminates in gastric body per chest x-ray 11/9  RN present in room during RD assessment.  NUTRITION - FOCUSED PHYSICAL EXAM:    Most Recent Value  Orbital Region Moderate depletion  Upper Arm Region Severe  depletion  Thoracic and Lumbar Region Severe depletion  Buccal Region Moderate depletion  Temple Region Severe depletion  Clavicle Bone Region Severe depletion  Clavicle and Acromion Bone Region Severe depletion  Scapular Bone Region Severe depletion  Dorsal Hand Severe depletion  Patellar Region Severe depletion  Anterior Thigh Region Severe depletion  Posterior Calf Region Severe depletion  Edema (RD Assessment) None  Hair Reviewed  Eyes Reviewed  Mouth Reviewed  Skin Reviewed  Nails Reviewed     Diet Order:   Diet Order            Diet NPO time specified  Diet effective now                EDUCATION NEEDS:   No education needs have been identified at this time  Skin:  Skin Assessment: Reviewed RN Assessment  Last BM:  PTA  Height:   Ht Readings from Last 1 Encounters:  02/14/20 5' 1"  (1.549 m)   Weight:   Wt Readings from Last 1 Encounters:  02/14/20 29.3 kg   BMI:  Body mass index is 12.21 kg/m.  Estimated Nutritional Needs:   Kcal:  1300-1500  Protein:  50-60 grams  Fluid:  1.2 L/day  Jacklynn Barnacle, MS, RD, LDN Pager number available on Amion

## 2020-02-14 NOTE — Procedures (Signed)
Patient Name: MERCI WALTHERS  MRN: [id number]  Epilepsy Attending: Charlsie Quest  Referring Physician/Provider: Dr Londell Moh Date: 02/14/2020 Duration: 25.39 mins  Patient history: 44 yo F with rett syndrome and breakthrough seizures. EEG to evaluate for seizure  Level of alertness: Awake  AEDs during EEG study: OXC, LRZ  Technical aspects: This EEG study was done with scalp electrodes positioned according to the 10-20 International system of electrode placement. Electrical activity was acquired at a sampling rate of 500Hz  and reviewed with a high frequency filter of 70Hz  and a low frequency filter of 1Hz . EEG data were recorded continuously and digitally stored.   Description: No posterior dominant rhythm was seen. EEG showed continuous generalized 3 to 6 Hz theta-delta slowing admixed with 15-18Hz  generalized beta activity.  Hyperventilation and photic stimulation were not performed.     ABNORMALITY -Continuous slow, generalized  IMPRESSION: This study is suggestive of moderate diffuse encephalopathy, nonspecific etiology. No seizures or epileptiform discharges were seen throughout the recording.   Robet Crutchfield 

## 2020-02-14 NOTE — Progress Notes (Signed)
VSS, weaned to rm air. Pt responds to voice. Unable to follow commands. Pt too lethargic for PO. Tube feeds started. Report called to Joe RN on 1C. Transported via bed.

## 2020-02-14 NOTE — Progress Notes (Signed)
eeg done °

## 2020-02-14 NOTE — Progress Notes (Signed)
PHARMACIST - PHYSICIAN COMMUNICATION  CONCERNING:  Enoxaparin (Lovenox) for DVT Prophylaxis    RECOMMENDATION: Patient was prescribed enoxaprin 40mg  q24 hours for VTE prophylaxis.   Filed Weights   02/13/20 2011 02/14/20 0009  Weight: 36 kg (79 lb 5.9 oz) 29.3 kg (64 lb 9.5 oz)    Body mass index is 12.21 kg/m.  Estimated Creatinine Clearance: 41.1 mL/min (by C-G formula based on SCr of 0.75 mg/dL).  Patient is candidate for enoxaparin 20mg  every 24 hours based on Weight <30kg  DESCRIPTION: Pharmacy has adjusted enoxaparin dose per [facility] policy.  Patient is now receiving enoxaparin 20 mg every 24 hours    , PharmD Clinical Pharmacist  02/14/2020 12:24 AM

## 2020-02-14 NOTE — Progress Notes (Signed)
CH visited w/pt.'s father briefly in ICU hallway; pt. admitted to hospital w/seizures yesterday; she has been struggling with numerous health issues for a long time, father shared, and she lives w/parents.  CH will monitor family/pt. needs and hopes to follow up later this week.

## 2020-02-14 NOTE — Progress Notes (Signed)
Subjective: Notes overnight noted, had some desats requiring bipap. No further seizures.   Exam: Vitals:   02/14/20 0700 02/14/20 0800  BP: 116/80 123/73  Pulse: (!) 102 96  Resp: 20 19  Temp:  (!) 96.7 F (35.9 C)  SpO2: 98% 98%   Gen: In bed, NAD Resp: non-labored breathing, no acute distress Abd: soft, nt  Neuro: MS: Keeps eyes tightly closed, does not follow commands CN: Pupils reactive bilaterally, keeps eyes tightly closed when trying to open them. 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 She had some difficulty yesterday with respiratory issues, seems sleepy this AM.   Recommendations: 1) Continue home medications 2) EEG  3) Will follow.   Ritta Slot, MD [facility] [phone]  If 7pm- 7am, please page neurology on call as listed in AMION.

## 2020-02-14 NOTE — Progress Notes (Addendum)
PROGRESS NOTE    Alicia Duncan   EAV:[id number]  DOB: Oct 02, 1975  PCP: [name], MD    DOA: 02/13/2020 LOS: 1   Brief Narrative   No notes on file    Assessment & Plan   Principal Problem:   Seizure (HCC) Active Problems:   Rett syndrome   Seizure disorder (HCC)   Protein-calorie malnutrition, severe   Seizures (HCC)   Seizures -  POA.  Per report, caregiver who typically administer meds went out of town.  Patient's father with dementia is unable to remember if/when meds last given, suspect missed doses at home led to breakthrough seizures again.   --Neurology following --Pt is back on home regimen with Gabitril and Lamictal, currently by NG tube --EEG pending --Ativan PRN seizure activity --Seizure precautions   Severe protein calorie malnutrition - will start on tube feeds by NG tube.  High risk for refeeding syndrome.  Monitor K, Mg, Phos.  Dietician following. -When last evaluated by SLP for dysphagia, recommended dysphagia 1 diet with thin liquids via baby feeding bottle.   Abnormal UA - follow urine culture.  Hold antibiotics for now given afebrile, no leukocytosis. Monitor clinically with low threshold to initiate antibiotics.    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   Of note, father with dementia unable to manage patient medications if caregiver(s) not available.  Caregiver out of town led to this admission. 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   Patient BMI: Body mass index is 12.21 kg/m.   DVT prophylaxis: enoxaparin (LOVENOX) injection 20 mg Start: 02/14/20 1000 Place TED hose Start: 02/13/20 1849   Diet:  Diet Orders (From admission, onward)    Start     Ordered   02/14/20 1308  Diet NPO time specified  Diet effective now        02/14/20 1307            Code  Status: Full Code    Subjective 02/14/20    Pt sleeping comfortably in stepdown, likely sedated from medications.  No acute events reported.  Father not currently at bedside.    Disposition Plan & Communication   Status is: Inpatient  Inpatient status remains appropriate due to severity of illness.  Patient with acute hypoxic respiratory failure on oxygen after witnessed aspiration episode.  Dispo: The patient is from: home with caregivers, father with dementia              Anticipated d/c is to: home if caregivers resumed for medication administration              Anticipated d/c date is: 2 days              Patient currently is not medically stable for d/c.    Family Communication: none at bedside on rounds, will attempt to call or meet father if visiting this afternoon   Consults, Procedures, Significant Events   Consultants:   Neurology  Procedures:   None  Antimicrobials:  Anti-infectives (From admission, onward)   None        Objective   Vitals:   02/14/20 1600 02/14/20 1700 02/14/20 1802 02/14/20 2018  BP: 113/63 132/74 124/74 (!) 151/65  Pulse: 82 95 82 93  Resp: 17 (!) Temp:   97.7 F (36.5 C) 98.5 F (36.9 C)  TempSrc:   Oral Oral  SpO2: 96% 99% 100% 96%  Weight:      Height:        Intake/Output Summary (Last 24 hours) at 02/14/2020 2237 Last data filed at 02/14/2020 1539 Gross per 24 hour  Intake 1266.07 ml  Output 325 ml  Net 941.07 ml   Filed Weights   02/13/20 2011 02/14/20 0009  Weight: 36 kg 29.3 kg    Physical Exam:  General exam: sleeping, sedated, no acute distress Respiratory system: CTAB, normal respiratory effort, on 6 L HFNC oxygen. Cardiovascular system: normal S1/S2, RRR, no pedal edema.   Gastrointestinal system: soft, NT, ND, +bowel sounds. Central nervous system: patient sedated, unable to assess at this time  Labs   Data Reviewed: I have personally reviewed following labs and imaging  studies  CBC: Recent Labs  Lab 02/13/20 1614 02/14/20 0046  WBC 7.3 9.2  NEUTROABS 4.7  --   HGB 13.7 12.1  HCT 42.9 39.3  MCV 86.0 89.1  PLT 126* 97*   Basic Metabolic Panel: Recent Labs  Lab 02/13/20 1614 02/14/20 0046 02/14/20 1659  NA 142  --   --   K 3.7  --   --   CL 108  --   --   CO2 24  --   --   GLUCOSE 97  --   --   BUN 20  --   --   CREATININE 0.75  --   --   CALCIUM 9.4  --   --   MG  --  1.9 1.8  PHOS  --   --  2.5   GFR: Estimated Creatinine Clearance: 41.1 mL/min (by C-G formula based on SCr of 0.75 mg/dL). Liver Function Tests: Recent Labs  Lab 02/13/20 1614  AST 39  ALT 29  ALKPHOS 56  BILITOT 0.7  PROT 7.9  ALBUMIN 4.2   No results for input(s): LIPASE, AMYLASE in the last 168 hours. No results for input(s): AMMONIA in the last 168 hours. Coagulation Profile: No results for input(s): INR, PROTIME in the last 168 hours. Cardiac Enzymes: No results for input(s): CKTOTAL, CKMB, CKMBINDEX, TROPONINI in the last 168 hours. BNP (last 3 results) No results for input(s): PROBNP in the last 8760 hours. HbA1C: No results for input(s): HGBA1C in the last 72 hours. CBG: Recent Labs  Lab 02/14/20 0747 02/14/20 1116 02/14/20 1551 02/14/20 1759 02/14/20 2024  GLUCAP 107* 96 87 99 82   Lipid Profile: No results for input(s): CHOL, HDL, LDLCALC, TRIG, CHOLHDL, LDLDIRECT in the last 72 hours. Thyroid Function Tests: No results for input(s): TSH, T4TOTAL, FREET4, T3FREE, THYROIDAB in the last 72 hours. Anemia Panel: No results for input(s): VITAMINB12, FOLATE, FERRITIN, TIBC, IRON, RETICCTPCT in the last 72 hours. Sepsis Labs: No results for input(s): PROCALCITON, LATICACIDVEN in the last 168 hours.  Recent Results (from the past 240 hour(s))  Respiratory Panel by RT PCR (Flu A&B, Covid) - Nasopharyngeal Swab     Status: None   Collection Time: 02/13/20  4:14 PM   Specimen: Nasopharyngeal Swab  Result Value Ref Range Status   SARS  Coronavirus 2 by RT PCR NEGATIVE NEGATIVE Final    Comment: (NOTE) SARS-CoV-2 target nucleic acids are NOT DETECTED.  The SARS-CoV-2 RNA is generally detectable in upper respiratoy specimens during the acute phase of infection. The lowest concentration of SARS-CoV-2 viral copies this assay can detect is 131 copies/mL. A negative result does not preclude SARS-Cov-2 infection and should not be used as the sole basis for treatment  or other patient management decisions. A negative result may occur with  improper specimen collection/handling, submission of specimen other than nasopharyngeal swab, presence of viral mutation(s) within the areas targeted by this assay, and inadequate number of viral copies (<131 copies/mL). A negative result must be combined with clinical observations, patient history, and epidemiological information. The expected result is Negative.  Fact Sheet for Patients:  https://www.moore.com/https://www.fda.gov/media/142436/download  Fact Sheet for Healthcare Providers:  https://www.young.biz/https://www.fda.gov/media/142435/download  This test is no t yet approved or cleared by the [place] FDA and  has been authorized for detection and/or diagnosis of SARS-CoV-2 by FDA under an Emergency Use Authorization (EUA). This EUA will remain  in effect (meaning this test can be used) for the duration of the COVID-19 declaration under Section 564(b)(1) of the Act, 21 U.S.C. section 360bbb-3(b)(1), unless the authorization is terminated or revoked sooner.     Influenza A by PCR NEGATIVE NEGATIVE Final   Influenza B by PCR NEGATIVE NEGATIVE Final    Comment: (NOTE) The Xpert Xpress SARS-CoV-2/FLU/RSV assay is intended as an aid in  the diagnosis of influenza from Nasopharyngeal swab specimens and  should not be used as a sole basis for treatment. Nasal washings and  aspirates are unacceptable for Xpert Xpress SARS-CoV-2/FLU/RSV  testing.  Fact Sheet for  Patients: https://www.moore.com/https://www.fda.gov/media/142436/download  Fact Sheet for Healthcare Providers: https://www.young.biz/https://www.fda.gov/media/142435/download  This test is not yet approved or cleared by the [place] FDA and  has been authorized for detection and/or diagnosis of SARS-CoV-2 by  FDA under an Emergency Use Authorization (EUA). This EUA will remain  in effect (meaning this test can be used) for the duration of the  Covid-19 declaration under Section 564(b)(1) of the Act, 21  U.S.C. section 360bbb-3(b)(1), unless the authorization is  terminated or revoked. Performed at [facility], [address] [place], [place] [address]   MRSA PCR Screening     Status: None   Collection Time: 02/14/20 12:08 AM   Specimen: Nasopharyngeal  Result Value Ref Range Status   MRSA by PCR NEGATIVE NEGATIVE Final    Comment:        The GeneXpert MRSA Assay (FDA approved for NASAL specimens only), is one component of a comprehensive MRSA colonization surveillance program. It is not intended to diagnose MRSA infection nor to guide or monitor treatment for MRSA infections. Performed at [facility], 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 EEG to evaluate for seizure Level of alertness: Awake AEDs during EEG study: OXC, LRZ Technical aspects: This EEG study was done with scalp electrodes positioned according to the 10-20 International system of electrode placement. Electrical activity was acquired at a sampling rate of 500Hz  and reviewed with a high frequency filter of 70Hz  and a low frequency filter of 1Hz . EEG data were recorded continuously and digitally  stored. Description: No posterior dominant rhythm was seen. EEG showed continuous generalized 3 to 6 Hz theta-delta slowing admixed with 15-18Hz  generalized beta activity.  Hyperventilation and photic stimulation were not performed.   ABNORMALITY -Continuous slow, generalized IMPRESSION: This study is suggestive of moderate diffuse encephalopathy, nonspecific etiology. No seizures or epileptiform discharges were seen throughout the  recording. Charlsie Quest   CT Head Wo Contrast  Result Date: 02/13/2020 CLINICAL DATA:  44 year old female status post multiple seizures today. EXAM: CT HEAD WITHOUT CONTRAST TECHNIQUE: Contiguous axial images were obtained from the base of the skull through the vertex without intravenous contrast. COMPARISON:  Head CT 12/17/2019 and earlier. FINDINGS: Brain: Stable chronic cerebral volume loss and mildly asymmetric subarachnoid CSF along the right frontal convexity. No midline shift, ventriculomegaly, mass effect, evidence of mass lesion, intracranial hemorrhage or evidence of cortically based acute infarction. Gray-white matter differentiation is within normal limits throughout the brain. Vascular: No suspicious intracranial vascular hyperdensity. Skull: Stable visualized osseous structures. Sinuses/Orbits: Visualized paranasal sinuses and mastoids are stable and well pneumatized. Other: Visualized orbits and scalp soft tissues are within normal limits. IMPRESSION: Stable noncontrast CT appearance of the brain, negative aside from chronic volume loss Electronically Signed   By: Odessa Fleming M.D.   On: 02/13/2020 18:19   DG Chest Port 1 View  Result Date: 02/14/2020 CLINICAL DATA:  Hypoxia.  Possible aspiration. EXAM: PORTABLE CHEST 1 VIEW COMPARISON:  One-view chest x-ray 12/15/2019 at 12:02 a.m. FINDINGS: OG tube is in place. Heart is enlarged. New left-sided airspace opacities are present. Right lung is clear. Postoperative changes are present in the spine. Remote right clavicle  and rib fractures are present. IMPRESSION: 1. New left-sided airspace disease compatible with pneumonia or aspiration. 2. Cardiomegaly without failure. 3. OG tube in place. Electronically Signed   By: Marin Roberts M.D.   On: 02/14/2020 10:07   DG Chest Port 1 View  Result Date: 02/14/2020 CLINICAL DATA:  NG tube placement, history of seizures EXAM: PORTABLE CHEST 1 VIEW COMPARISON:  Radiograph 02/13/2020 FINDINGS: Interval placement of a transesophageal tube with the tip and side port terminating in the left upper quadrant, below the GE junction. Likely within the gastric body. Telemetry leads and external support devices overlie the patient as well. Redemonstration of the long segment thoracolumbar fusion for a chronic dextroconvex thoracic scoliosis. Some low volumes and atelectasis are noted. Stable cardiomediastinal contours. Chronic right lower rib deformities are similar to prior. Healed right clavicular deformity is also unchanged. No acute osseous or soft tissue abnormality. IMPRESSION: Interval placement of a transesophageal tube with the tip and side port terminating in the left upper quadrant, below the GE junction. No other significant interval change. Electronically Signed   By: Kreg Shropshire M.D.   On: 02/14/2020 00:20   DG Chest Port 1 View  Result Date: 02/13/2020 CLINICAL DATA:  44 year old female status post seizure today. Hypoxia on room air. EXAM: PORTABLE CHEST 1 VIEW COMPARISON:  Portable chest 1611 hours today.  Chest CT 05/12/2016. FINDINGS: Portable AP supine view at 2131 hours. Chronic dextroconvex thoracic scoliosis and posterior spinal rods. Lung volumes and mediastinal contours appear stable since the 2018 CT. No pneumothorax or pleural effusion is evident. Allowing for portable technique the lungs are clear. Stable visible bowel gas pattern. Chronic right clavicle deformity. Chronic right lower posterior rib deformities. Stable visualized osseous structures. IMPRESSION:  No acute cardiopulmonary abnormality. Electronically Signed   By: Odessa Fleming M.D.   On: 02/13/2020 21:41   DG Chest Portable 1 View  Result Date: 02/13/2020 CLINICAL DATA:  Cough, seizures EXAM: PORTABLE CHEST 1 VIEW COMPARISON:  12/17/2019 FINDINGS: Single frontal view of the chest demonstrates a stable cardiac silhouette. Chronic scarring throughout the lungs without airspace disease, effusion, or pneumothorax. Stable postsurgical changes from spinal fusion. IMPRESSION: 1. Stable exam, no acute process. Electronically  Signed   By: Sharlet Salina M.D.   On: 02/13/2020 16:41     Medications   Scheduled Meds: . Chlorhexidine Gluconate Cloth  6 each Topical Q0600  . enoxaparin (LOVENOX) injection  20 mg Subcutaneous Q24H  . [START ON 02/15/2020] FLUoxetine  10 mg Per Tube Daily  . LORazepam  1 mg Intravenous BID  . Oxcarbazepine  600 mg Per Tube BID  . pantoprazole (PROTONIX) IV  40 mg Intravenous QHS  . tiaGABine  4 mg Per Tube BID   Continuous Infusions: . dextrose 5% lactated ringers 50 mL/hr at 02/14/20 1539  . feeding supplement (OSMOLITE 1.2 CAL) 1,000 mL (02/14/20 1442)       LOS: 1 day    Time spent: 30 minutes with > 50% spent in coordination of care and direct patient contact.    Pennie Banter, DO [facility]  02/14/2020, 10:37 PM    If 7PM-7AM, please contact night-coverage. How to contact the [facility] Attending or Consulting provider 7A - 7P or covering provider during after hours 7P -7A, for this patient?    1. Check the care team in [facility] and look for a) attending/consulting [facility] provider listed and b) the [facility] team listed 2. Log into [email or web address] and use 's universal password to access. If you do not have the password, please contact the hospital operator. 3. Locate the [facility] provider you are looking for under [facility] and page to a number that you can be directly reached. 4. If you still have difficulty reaching the provider, please page the  [facility] (Director on Call) for the Hospitalists listed on amion for assistance.

## 2020-02-15 ENCOUNTER — Inpatient Hospital Stay: Payer: Medicare Other

## 2020-02-15 DIAGNOSIS — Z7189 Other specified counseling: Secondary | ICD-10-CM

## 2020-02-15 DIAGNOSIS — Z515 Encounter for palliative care: Secondary | ICD-10-CM

## 2020-02-15 DIAGNOSIS — Z66 Do not resuscitate: Secondary | ICD-10-CM

## 2020-02-15 LAB — BASIC METABOLIC PANEL
Anion gap: 7 (ref 5–15)
BUN: 11 mg/dL (ref 6–20)
CO2: 23 mmol/L (ref 22–32)
Calcium: 8.3 mg/dL — ABNORMAL LOW (ref 8.9–10.3)
Chloride: 103 mmol/L (ref 98–111)
Creatinine, Ser: 0.43 mg/dL — ABNORMAL LOW (ref 0.44–1.00)
GFR, Estimated: >60 mL/min (ref >60)
Glucose, Bld: 100 mg/dL — ABNORMAL HIGH (ref 70–99)
Potassium: 3.3 mmol/L — ABNORMAL LOW (ref 3.5–5.1)
Sodium: 133 mmol/L — ABNORMAL LOW (ref 135–145)

## 2020-02-15 LAB — MAGNESIUM
Magnesium: 1.9 mg/dL (ref 1.7–2.4)
Magnesium: 1.8 mg/dL (ref 1.7–2.4)

## 2020-02-15 LAB — GLUCOSE, CAPILLARY
Glucose-Capillary: 86 mg/dL (ref 70–99)
Glucose-Capillary: 92 mg/dL (ref 70–99)
Glucose-Capillary: 172 mg/dL — ABNORMAL HIGH (ref 70–99)
Glucose-Capillary: 138 mg/dL — ABNORMAL HIGH (ref 70–99)
Glucose-Capillary: 116 mg/dL — ABNORMAL HIGH (ref 70–99)
Glucose-Capillary: 83 mg/dL (ref 70–99)

## 2020-02-15 LAB — PHOSPHORUS
Phosphorus: 2.5 mg/dL (ref 2.5–4.6)
Phosphorus: 3.2 mg/dL (ref 2.5–4.6)

## 2020-02-15 MED ORDER — LACTATED RINGERS IV SOLN: INTRAVENOUS | Status: DC

## 2020-02-15 MED ORDER — POTASSIUM CHLORIDE 20 MEQ PO PACK
40.0000 meq | PACK | Freq: Once | ORAL | Status: AC
  Administered 2020-02-15: 17:00:00 40 meq via ORAL
  Filled 2020-02-15: qty 2

## 2020-02-15 MED ORDER — SODIUM CHLORIDE 0.9 % IV SOLN
1.0000 g | INTRAVENOUS | Status: DC
  Administered 2020-02-15 – 2020-02-16 (×2): 1 g via INTRAVENOUS
  Filled 2020-02-15 (×2): qty 1

## 2020-02-15 MED ORDER — POTASSIUM CHLORIDE 10 MEQ/100ML IV SOLN
10.0000 meq | INTRAVENOUS | Status: DC
  Administered 2020-02-15: 12:00:00 10 meq via INTRAVENOUS
  Filled 2020-02-15: qty 100

## 2020-02-15 NOTE — Consult Note (Signed)
Consultation Note Date: 02/15/2020   Patient Name: Alicia Duncan  DOB: 02-16-1976  MRN: [id number]  Age / Sex: 44 y.o., female  PCP: [name], MD Referring Physician: Briant Cedar, MD  Reason for Consultation: Establishing goals of care  HPI/Patient Profile: 44 y.o. female  with past medical history of Rett syndrome and seizures admitted on 02/13/2020 with seizures and malnutrition. Patient likely did not get seizure medications - now with NG tube and receiving medications. PMT consulted for GOC.   Clinical Assessment and Goals of Care: I have reviewed medical records including EPIC notes, labs and imaging, received report from RN, assessed the patient and then spoke with her brother Alicia Duncan who is also her legal guardian  to discuss diagnosis prognosis, GOC, EOL wishes, disposition and options.  Alicia Duncan shares that he recently became legal guardian. He shares that patient's mother passed away over a year ago following complications from stroke. Patient's father has early onset dementia. Patient does have another brother who has schizophrenia and is unable to serve as Management consultant.   I introduced Palliative Medicine as specialized medical care for people living with serious illness. It focuses on providing relief from the symptoms and stress of a serious illness. The goal is to improve quality of life for both the patient and the family.  Alicia Duncan shares that patient currently lives her father and other brother. Alicia Duncan lives in [place]. He sees patient about every 10 days. Patient has a caregiver through [facility], [facility]alyn [facility]. Alicia Duncan stays with patient every day for about ten hours - recently she was out of town and this is what led to admission.   Patient lived with her mother until 2017 when mother's medical condition prevented her from providing necessary care. She has lived with her father since that time but his early onset  dementia has complicated care giving.   As far as functional and nutritional status, Alicia Duncan tells me patient is non ambulatory and dependent on other for all ADLs. Does require others to feed her. She does not speak. Alicia Duncan tells me he thinks she eats pretty good and notes that there is usually an ensure near her.    We discussed patient's current illness and what it means in the larger context of patient's on-going co-morbidities.  Natural disease trajectory and expectations at EOL were discussed. We discuss Rett syndrome and limited life expectancy. Alicia Duncan discusses increase in hospital admission and shares that he knows that her life expectancy is shorter than someone without Rett syndrome.   I attempted to elicit values and goals of care important to the patient. The difference between aggressive medical intervention and comfort care was considered in light of the patient's goals of care. Advance directives, concepts specific to code status, artificial feeding and hydration, and rehospitalization were considered and discussed.  Alicia Duncan shares that their family usually leans away from aggressive medical interventions - he tells me of his mother opting for hospice care instead of dialysis. He tells me of his desire to focus on Mckenzee's quality of life and treat the treatable but not put her through aggressive interventions.   I completed a MOST form today. Alicia Duncan outlined his wishes for the following treatment decisions:  Cardiopulmonary Resuscitation: Do Not Attempt Resuscitation (DNR/No CPR)  Medical Interventions: Limited Additional Interventions: Use medical treatment, IV fluids and cardiac monitoring as indicated, DO NOT USE intubation or mechanical ventilation. May consider use of less invasive airway support such as BiPAP or CPAP. Also provide comfort measures.  Transfer to the hospital if indicated. Avoid intensive care.   Antibiotics: Antibiotics if indicated  IV Fluids: IV fluids if indicated    Feeding Tube: Feeding tube for a defined trial period   Discussed with Alicia Duncan the importance of continued conversation with family and the medical providers regarding overall plan of care and treatment options, ensuring decisions are within the context of the patient's values and GOCs.    Hospice and Palliative Care services outpatient were explained and offered. Alicia Duncan is very familiar with hospice as his mother received services through hospice. Seems that he would be agreeable whenever this is appropriate for Alicia Duncan. I did offer palliative care outpatient however he declined as he worries the visits to the home would upset his father.   Alicia Duncan is hopeful patient can return home with her father and full time caregiver as he worries transition to a facility would upset both patient and father.   Questions and concerns were addressed. The family was encouraged to call with questions or concerns.   Primary Decision Maker LEGAL GUARDIAN - brother Alicia Duncan [phone]    SUMMARY OF RECOMMENDATIONS   Code status changed to DNR/DNI and MOST completed as above Alicia Duncan tells me patient has full time caregiver in the home 10 hrs everyday - he is hopeful she can return home Not interested in outpatient palliative follow up - would likely be interested in hospice services when this is more appropriate  Code Status/Advance Care Planning:  DNR  Prognosis:   Unable to determine  Discharge Planning: family hopeful for return to home with caregiver      Primary Diagnoses: Present on Admission: . Rett syndrome . Protein-calorie malnutrition, severe   I have reviewed the medical record, interviewed the patient and family, and examined the patient. The following aspects are pertinent.  Past Medical History:  Diagnosis Date  . Dystonia   . Rett syndrome   . Seizures (HCC)    Social History   Socioeconomic History  . Marital status: Single    Spouse name: Not on file  . Number of  children: Not on file  . Years of education: Not on file  . Highest education level: Not on file  Occupational History  . Not on file  Tobacco Use  . Smoking status: Never Smoker  . Smokeless tobacco: Never Used  Substance and Sexual Activity  . Alcohol use: No  . Drug use: No  . Sexual activity: Not on file  Other Topics Concern  . Not on file  Social History Narrative  . Not on file   Social Determinants of Health   Financial Resource Strain:   . Difficulty of Paying Living Expenses: Not on file  Food Insecurity:   . Worried About Programme researcher, broadcasting/film/video in the Last Year: Not on file  . Ran Out of Food in the Last Year: Not on file  Transportation Needs:   . Lack of Transportation (Medical): Not on file  . Lack of Transportation (Non-Medical): Not on file  Physical Activity:   . Days of Exercise per Week: Not on file  . Minutes of Exercise per Session: Not on file  Stress:   . Feeling of Stress : Not on file  Social Connections:   . Frequency of Communication with Friends and Family: Not on file  . Frequency of Social Gatherings with Friends and Family: Not on file  . Attends Religious Services: Not on file  . Active Member of Clubs or Organizations: Not on  file  . Attends Banker Meetings: Not on file  . Marital Status: Not on file   Family History  Problem Relation Age of Onset  . Seizures Mother   . Memory loss Father   . Diverticulosis Father   . Schizophrenia Brother    Scheduled Meds: . Chlorhexidine Gluconate Cloth  6 each Topical Q0600  . enoxaparin (LOVENOX) injection  20 mg Subcutaneous Q24H  . FLUoxetine  10 mg Per Tube Daily  . LORazepam  1 mg Intravenous BID  . Oxcarbazepine  600 mg Per Tube BID  . pantoprazole (PROTONIX) IV  40 mg Intravenous QHS  . tiaGABine  4 mg Per Tube BID   Continuous Infusions: . cefTRIAXone (ROCEPHIN)  IV 1 g (02/15/20 1027)  . feeding supplement (OSMOLITE 1.2 CAL) 45 mL/hr at 02/15/20 0430  . lactated  ringers    . potassium chloride 10 mEq (02/15/20 1223)   PRN Meds:. Allergies  Allergen Reactions  . Benadryl [Diphenhydramine]   . Penicillins Other (See Comments)    Has patient had a PCN reaction causing immediate rash, facial/tongue/throat swelling, SOB or lightheadedness with hypotension: Unknown Has patient had a PCN reaction causing severe rash involving mucus membranes or skin necrosis: Unknown Has patient had a PCN reaction that required hospitalization: Unknown Has patient had a PCN reaction occurring within the last 10 years: Unknown If all of the above answers are "NO", then may proceed with Cephalosporin use.    Review of Systems  Unable to perform ROS: Patient nonverbal    Physical Exam Constitutional:      General: She is not in acute distress. Skin:    General: Skin is warm and dry.  Neurological:     Mental Status: She is alert.     Comments: Nonverbal     Vital Signs: BP 112/73 (BP Location: Right Arm)   Pulse 76   Temp 97.6 F (36.4 C) (Oral)   Resp 20   Ht 5\' 1"  (1.549 m)   Wt 29.3 kg   SpO2 100%   BMI 12.21 kg/m  Pain Scale: Faces   Pain Score: 0-No pain   SpO2: SpO2: 100 % O2 Device:SpO2: 100 % O2 Flow Rate: .O2 Flow Rate (L/min): 1 L/min  IO: Intake/output summary:   Intake/Output Summary (Last 24 hours) at 02/15/2020 1355 Last data filed at 02/14/2020 1539 Gross per 24 hour  Intake 395.59 ml  Output --  Net 395.59 ml    LBM: Last BM Date:  (PTA) Baseline Weight: Weight: 36 kg Most recent weight: Weight: 29.3 kg     Palliative Assessment/Data: PPS 30%    Time Total: 85 minutes Greater than 50%  of this time was spent counseling and coordinating care related to the above assessment and plan.  13/12/2019, DNP, AGNP-C Palliative Medicine Team [phone] Pager: [phone]

## 2020-02-15 NOTE — TOC Initial Note (Addendum)
Transition of Care [facility]) - Initial/Assessment Note    Patient Details  Name: Alicia Duncan MRN: [id number] Date of Birth: Aug 29, 1975  [facility] CM/SW Contact:    Liliana Cline, LCSW Phone Number: 02/15/2020, 3:28 PM  Clinical Narrative:            [facility] consult for concerns about care at home. CSW reviewed patient's chart and spoke with NP [facility], patient appears well cared for, however concerns about patient's Father being able to meet patient's needs when her sitter is not there. Concern that paid sitter was out of town and Father forgot to give patient her medications which led to this hospitalization.  CSW called patient's brother/Legal Guardian Genelle Bal to follow up. Genelle Bal reported patient lives with their Father who has early onset Dementia and another brother who has mental health concerns. Patient has a in home aide through [employer] for 10 hours per day 6 days per week. Genelle Bal sees the family around once a week and checks in via phone several days a week. Genelle Bal reported this aide has been out of town for a week and he thinks it is possible patient's Father did not give patient her medications which could have led to this hospitalization. CSW informed Genelle Bal of the medical team's concerns about Arron's needs when her aide is off/out of town. He reported he has been working with the agency to get relief for the aide so that services are not interrupted. CSW asked about his plan until then when the aide is not available. He reported he is unable to provide care but is there to be a "voice for patient." He reported he will reach out to the Supervisor at [facility] to let them know hospital staff concerns and ask about the status of getting additional staff in the home for patient.   4:05- Consulted with TOC Supervisor. No APS report at this time but will continue to monitor. Please inform TOC of any additional concerns or updates. that arise.   Expected Discharge Plan:  Home/Self Care (home with aide services) Barriers to Discharge: Continued Medical Work up   Patient Goals and CMS Choice Patient states their goals for this hospitalization and ongoing recovery are:: to return home CMS Medicare.gov Compare Post Acute Care list provided to:: Patient Represenative (must comment) Choice offered to / list presented to : [facility] POA / Guardian  Expected Discharge Plan and Services Expected Discharge Plan: Home/Self Care (home with aide services)       Living arrangements for the past 2 months: Single Family Home                                      Prior Living Arrangements/Services Living arrangements for the past 2 months: Single Family Home Lives with:: Siblings, Parents          Need for Family Participation in Patient Care: Yes (Comment) Care giver support system in place?: Yes (comment) Current home services: Homehealth aide Criminal Activity/Legal Involvement Pertinent to Current Situation/Hospitalization: No - Comment as needed  Activities of Daily Living      Permission Sought/Granted                  Emotional Assessment         Alcohol / Substance Use: Not Applicable Psych Involvement: No (comment)  Admission diagnosis:  Rett syndrome [F84.2] Seizure (HCC) [R56.9] Seizures (HCC) [F42.9]  Hypoxia [R09.02] Encounter for imaging study to confirm nasogastric (NG) tube placement [Z01.89] Patient Active Problem List   Diagnosis Date Noted  . Goals of care, counseling/discussion   . Palliative care by specialist   . DNR (do not resuscitate)   . Seizures (HCC) 02/13/2020  . Protein-calorie malnutrition, severe 12/20/2019  . Breakthrough seizure (HCC) 12/18/2019  . Cough 05/13/2016  . Pancytopenia (HCC) 05/13/2016  . Iron deficiency anemia 05/13/2016  . SIRS (systemic inflammatory response syndrome) (HCC) 05/11/2016  . Dental infection 05/11/2016  . 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 Seizure disorder (HCC) 05/11/2016  .  Sepsis (HCC) 05/11/2016  . Seizure (HCC) 04/25/2013   PCP:  [name], MD Pharmacy:   [facility] [phone] [facility], [facility] - [address] [place] [address] Phone: [phone] Fax: [phone]     Social Determinants of Health (SDOH) Interventions    Readmission Risk Interventions No flowsheet data found.

## 2020-02-15 NOTE — Progress Notes (Signed)
PROGRESS NOTE  LARIN DEPAOLI QPY:[id number] DOB: 1975/09/16 DOA: 02/13/2020 PCP: [name], MD  HPI/Recap of past 24 hours: HPI from Dr Sedalia Muta Herbert Seta Alicia Duncan is a 44 y.o. female with medical history significant for seizure. Mr. [name] states that Malin developed seizures at approximately 9 AM, for approximately 1 hour.  Likely 2/2 noncompliance to medication due to caregiver not being available and father who possibly has some cognitive deficits.  Patient lives with father who is primary guardian and caregiver. Per ED provider, Ms. Hanny Elsberry has a Comptroller that comes regularly to help give her medications.  However ED provider states that a sitter has been out for the last 2 days and her father taking care of her.  It is unclear whether or not Alicia Duncan received her seizure medications. ED provider states that patient was seizing for 45 minutes, when father called EMS.  Patient admitted for further management.    Today, noted to be comfortable, no witnessed seizures since admission.  Patient has mutism and ROS could not be done.   Assessment/Plan: Principal Problem:   Seizure (HCC) Active Problems:   Rett syndrome   Seizure disorder (HCC)   Protein-calorie malnutrition, severe   Seizures (HCC)   Goals of care, counseling/discussion   Palliative care by specialist   DNR (do not resuscitate)  Breakthrough seizure History of seizures Likely 2/2 noncompliant Neurology consulted, restart home regimen with Gabitril and Lamictal via NG tube, Ativan as needed for seizure activity EEG with no active seizure activity Seizure precautions  UTI Currently afebrile, with no leukocytosis UA positive for UTI UC growing greater than 100,000 E. Coli Start IV ceftriaxone  Severe protein calorie malnutrition Continue tube feeds, monitor for refeeding syndrome Last evaluation by SLP for dysphagia recommended dysphagia 1 diet with thin liquids via baby feeding bottle Continue IV  fluids Plan to restart p.o. 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   Weight as of this encounter: 29.3 kg.     Code Status: DNR  Family Communication: None at bedside  Disposition Plan: Status is: Inpatient  Remains inpatient appropriate because:Inpatient level of care appropriate due to severity of illness   Dispo: The patient is from: Home              Anticipated d/c is to: Home              Anticipated d/c date is: 2 days              Patient currently is not medically stable to d/c.    Consultants:  Neurology  Procedures:  None  Antimicrobials:  Ceftriaxone  DVT prophylaxis: Lovenox   Objective: Vitals:   02/15/20 0425 02/15/20 0702 02/15/20 1138 02/15/20 1602  BP: 115/71 110/76 112/73 95/65  Pulse: 89 80 76 85  Resp:  18 20 17  Temp:  97.9 F (36.6 C) 97.6 F (36.4 C) 98.4 F (36.9 C)  TempSrc:  Oral Oral Oral  SpO2:  98% 100% 99%  Weight:      Height:       No intake or output data in the 24 hours ending 02/15/20 1758 Filed Weights   02/13/20 2011 02/14/20 0009  Weight: 36 kg 29.3 kg    Exam:  General: NAD, awake, alert, mute  Cardiovascular: S1, S2 present  Respiratory: CTAB  Abdomen: Soft, nontender,  nondistended, bowel sounds present  Musculoskeletal: No bilateral pedal edema noted  Skin: Normal  Psychiatry:  Unable to assess   Data Reviewed: CBC: Recent Labs  Lab 02/13/20 1614 02/14/20 0046  WBC 7.3 9.2  NEUTROABS 4.7  --   HGB 13.7 12.1  HCT 42.9 39.3  MCV 86.0 89.1  PLT 126* 97*   Basic Metabolic Panel: Recent Labs  Lab 02/13/20 1614 02/14/20 0046 02/14/20 1659 02/15/20 0514 02/15/20 1637  NA 142  --   --  133*  --   K 3.7  --   --  3.3*  --   CL 108  --   --  103  --   CO2 24  --   --  23  --   GLUCOSE 97  --   --  100*  --   BUN 20  --   --  11  --   CREATININE 0.75  --   --  0.43*  --   CALCIUM 9.4  --   --  8.3*  --   MG  --  1.9 1.8 1.9 1.8  PHOS  --   --  2.5 2.5 3.2   GFR: Estimated Creatinine Clearance: 41.1 mL/min (A) (by C-G formula based on SCr of 0.43 mg/dL (L)). Liver Function Tests: Recent Labs  Lab 02/13/20 1614  AST 39  ALT 29  ALKPHOS 56  BILITOT 0.7  PROT 7.9  ALBUMIN 4.2   No results for input(s): LIPASE, AMYLASE in the last 168 hours. No results for input(s): AMMONIA in the last 168 hours. Coagulation Profile: No results for input(s): INR, PROTIME in the last 168 hours. Cardiac Enzymes: No results for input(s): CKTOTAL, CKMB, CKMBINDEX, TROPONINI in the last 168 hours. BNP (last 3 results) No results for input(s): PROBNP in the last 8760 hours. HbA1C: No results for input(s): HGBA1C in the last 72 hours. CBG: Recent Labs  Lab 02/15/20 0112 02/15/20 0357 02/15/20 0704 02/15/20 1233 02/15/20 1545  GLUCAP 86 92 172* 138* 116*   Lipid Profile: No results for input(s): CHOL, HDL, LDLCALC, TRIG, CHOLHDL, LDLDIRECT in the last 72 hours. Thyroid Function Tests: No results for input(s): TSH, T4TOTAL, FREET4, T3FREE, THYROIDAB in the last 72 hours. Anemia Panel: No results for input(s): VITAMINB12, FOLATE, FERRITIN, TIBC, IRON, RETICCTPCT in the last 72 hours. Urine analysis:    Component Value Date/Time   COLORURINE  YELLOW (A) 02/13/2020 0455   APPEARANCEUR HAZY (A) 02/13/2020 0455   LABSPEC 1.023 02/13/2020 0455   PHURINE 5.0 02/13/2020 0455   GLUCOSEU NEGATIVE 02/13/2020 0455   HGBUR NEGATIVE 02/13/2020 0455   BILIRUBINUR NEGATIVE 02/13/2020 0455   KETONESUR 5 (A) 02/13/2020 0455   PROTEINUR NEGATIVE 02/13/2020 0455   UROBILINOGEN 1.0 04/25/2013 1427   NITRITE POSITIVE (A) 02/13/2020 0455   LEUKOCYTESUR NEGATIVE 02/13/2020 0455   Sepsis Labs: @LABRCNTIP (procalcitonin:4,lacticidven:4)  ) Recent Results (from the past 240 hour(s))  Respiratory Panel by RT PCR (Flu A&B, Covid) - Nasopharyngeal Swab  Status: None   Collection Time: 02/13/20  4:14 PM   Specimen: Nasopharyngeal Swab  Result Value Ref Range Status   SARS Coronavirus 2 by RT PCR NEGATIVE NEGATIVE Final    Comment: (NOTE) SARS-CoV-2 target nucleic acids are NOT DETECTED.  The SARS-CoV-2 RNA is generally detectable in upper respiratoy specimens during the acute phase of infection. The lowest concentration of SARS-CoV-2 viral copies this assay can detect is 131 copies/mL. A negative result does not preclude SARS-Cov-2 infection and should not be used as the sole basis for treatment or other patient management decisions. A negative result may occur with  improper specimen collection/handling, submission of specimen other than nasopharyngeal swab, presence of viral mutation(s) within the areas targeted by this assay, and inadequate number of viral copies (<131 copies/mL). A negative result must be combined with clinical observations, patient history, and epidemiological information. The expected result is Negative.  Fact Sheet for Patients:  https://www.moore.com/  Fact Sheet for Healthcare Providers:  https://www.young.biz/  This test is no t yet approved or cleared by the [place] FDA and  has been authorized for detection and/or diagnosis of SARS-CoV-2 by FDA under an Emergency  Use Authorization (EUA). This EUA will remain  in effect (meaning this test can be used) for the duration of the COVID-19 declaration under Section 564(b)(1) of the Act, 21 U.S.C. section 360bbb-3(b)(1), unless the authorization is terminated or revoked sooner.     Influenza A by PCR NEGATIVE NEGATIVE Final   Influenza B by PCR NEGATIVE NEGATIVE Final    Comment: (NOTE) The Xpert Xpress SARS-CoV-2/FLU/RSV assay is intended as an aid in  the diagnosis of influenza from Nasopharyngeal swab specimens and  should not be used as a sole basis for treatment. Nasal washings and  aspirates are unacceptable for Xpert Xpress SARS-CoV-2/FLU/RSV  testing.  Fact Sheet for Patients: https://www.moore.com/  Fact Sheet for Healthcare Providers: https://www.young.biz/  This test is not yet approved or cleared by the [place] FDA and  has been authorized for detection and/or diagnosis of SARS-CoV-2 by  FDA under an Emergency Use Authorization (EUA). This EUA will remain  in effect (meaning this test can be used) for the duration of the  Covid-19 declaration under Section 564(b)(1) of the Act, 21  U.S.C. section 360bbb-3(b)(1), unless the authorization is  terminated or revoked. Performed at [facility], [address] [place], [place] [address]   MRSA PCR Screening     Status: None   Collection Time: 02/14/20 12:08 AM   Specimen: Nasopharyngeal  Result Value Ref Range Status   MRSA by PCR NEGATIVE NEGATIVE Final    Comment:        The GeneXpert MRSA Assay (FDA approved for NASAL specimens only), is one component of a comprehensive MRSA colonization surveillance program. It is not intended to diagnose MRSA infection nor to guide or monitor treatment for MRSA infections. Performed at [facility], [address] [place], [place] [address]   Urine Culture     Status: Abnormal (Preliminary result)   Collection Time: 02/14/20   4:55 AM   Specimen: Urine, Random  Result Value Ref Range Status   Specimen Description   Final    URINE, RANDOM Performed at [facility], [address] [place], [place] [address]    Special Requests   Final    NONE Performed at [facility], [address] [place], [place] [address]    Culture (A)  Final    >=100,000 COLONIES/mL ESCHERICHIA  COLI SUSCEPTIBILITIES TO FOLLOW Performed at [facility], [address] [place], [place] [address]    Report Status PENDING  Incomplete      Studies: DG Abd Portable 1V  Result Date: 02/15/2020 CLINICAL DATA:  NG tube placement EXAM: PORTABLE ABDOMEN - 1 VIEW COMPARISON:  February 14, 2020 FINDINGS: The NG tube appears grossly well position with the tip projecting over the gastric body. There is extensive gaseous distention of loops of small bowel and colon scattered throughout the abdomen. Fusion hardware is noted throughout the lumbar spine. IMPRESSION: 1. NG tube as above. 2. Persistent gaseous distention of small bowel and colon. Electronically Signed   By: Katherine Mantlehristopher  Green M.D.   On: 02/15/2020 03:54   DG Abd Portable 1V  Result Date: 02/14/2020 CLINICAL DATA:  Enteric catheter placement EXAM: PORTABLE ABDOMEN - 1 VIEW COMPARISON:  02/14/2020 at 9:36 p.m. FINDINGS: Two upright frontal views of the abdomen and pelvis are obtained. Enteric catheter is seen previously has been removed in the interim. I do not identify an enteric catheter on this exam. There is improved aeration at the left lung base since prior study. Lung bases are clear. Diffuse gaseous distention of the bowel is noted without evidence of obstruction. No free gas in the greater peritoneal sac. Stable spinal fusion rods. IMPRESSION: 1. Previous enteric catheter has been removed, and no enteric catheter is identified on this exam. 2. Improved aeration at the left lung base. 3. Nonspecific gaseous distention of the bowel without evidence of  high-grade obstruction. Electronically Signed   By: Sharlet SalinaMichael  Brown M.D.   On: 02/14/2020 23:24    Scheduled Meds: . Chlorhexidine Gluconate Cloth  6 each Topical Q0600  . enoxaparin (LOVENOX) injection  20 mg Subcutaneous Q24H  . FLUoxetine  10 mg Per Tube Daily  . LORazepam  1 mg Intravenous BID  . Oxcarbazepine  600 mg Per Tube BID  . pantoprazole (PROTONIX) IV  40 mg Intravenous QHS  . tiaGABine  4 mg Per Tube BID    Continuous Infusions: . cefTRIAXone (ROCEPHIN)  IV 1 g (02/15/20 1027)  . feeding supplement (OSMOLITE 1.2 CAL) 45 mL/hr at 02/15/20 0430  . lactated ringers 50 mL/hr at 02/15/20 1245     LOS: 2 days     Briant CedarNkeiruka J Kellie Murrill, MD [facility]  If 7PM-7AM, please contact night-coverage [email or web address] 02/15/2020, 5:58 PM

## 2020-02-15 NOTE — Progress Notes (Signed)
Subjective: No further seizures, she is much more awake today  Exam: Vitals:   02/15/20 0425 02/15/20 0702  BP: 115/71 110/76  Pulse: 89 80  Resp:  18  Temp:  97.9 F (36.6 C)  SpO2:  98%   Gen: In bed, NAD Resp: non-labored breathing, no acute distress Abd: soft, nt  Neuro: MS: Awake, does not follow commands, but she does fixate the examiner CN: Fixates, but does not clearly look all the way to the left Motor: She moves all extremities, with spastic quadriparesis Sensory: Response to noxious stimulation in all four extremities  Pertinent Labs: Creatinine 0.43  Impression: 44 year old female with breakthrough seizures in the setting of medication noncompliance.  The social situation is complicated due to health problems with the father, social services have been involved.   From a neurological perspective the patient is able to take her medications, the NG tube could be discontinued, however there are concerns for whether she is getting adequate nutrition.  Recommendations: 1) Continue home antiepileptic medications 2) I do think that there needs to be some assurance that she has a safe environment to return to.  Ritta Slot, MD [facility] [phone]  If 7pm- 7am, please page neurology on call as listed in AMION.

## 2020-02-16 LAB — URINE CULTURE: Culture: >=100000 — AB

## 2020-02-16 LAB — GLUCOSE, CAPILLARY
Glucose-Capillary: 108 mg/dL — ABNORMAL HIGH (ref 70–99)
Glucose-Capillary: 111 mg/dL — ABNORMAL HIGH (ref 70–99)
Glucose-Capillary: 121 mg/dL — ABNORMAL HIGH (ref 70–99)
Glucose-Capillary: 81 mg/dL (ref 70–99)
Glucose-Capillary: 93 mg/dL (ref 70–99)
Glucose-Capillary: 95 mg/dL (ref 70–99)

## 2020-02-16 LAB — BASIC METABOLIC PANEL
Anion gap: 5 (ref 5–15)
BUN: 10 mg/dL (ref 6–20)
CO2: 25 mmol/L (ref 22–32)
Calcium: 8.1 mg/dL — ABNORMAL LOW (ref 8.9–10.3)
Chloride: 107 mmol/L (ref 98–111)
Creatinine, Ser: 0.44 mg/dL (ref 0.44–1.00)
GFR, Estimated: >60 mL/min (ref >60)
Glucose, Bld: 118 mg/dL — ABNORMAL HIGH (ref 70–99)
Potassium: 3.8 mmol/L (ref 3.5–5.1)
Sodium: 137 mmol/L (ref 135–145)

## 2020-02-16 LAB — MISC LABCORP TEST (SEND OUT): Labcorp test code: 716928

## 2020-02-16 LAB — PHOSPHORUS: Phosphorus: 3.3 mg/dL (ref 2.5–4.6)

## 2020-02-16 LAB — MAGNESIUM: Magnesium: 1.9 mg/dL (ref 1.7–2.4)

## 2020-02-16 MED ORDER — CEPHALEXIN 500 MG PO CAPS
500.0000 mg | ORAL_CAPSULE | Freq: Two times a day (BID) | ORAL | Status: DC
  Administered 2020-02-17: 09:00:00 500 mg via ORAL
  Filled 2020-02-16: qty 1

## 2020-02-16 MED ORDER — LORAZEPAM 1 MG PO TABS
1.0000 mg | ORAL_TABLET | Freq: Two times a day (BID) | ORAL | Status: DC
  Administered 2020-02-16 – 2020-02-17 (×3): 1 mg via ORAL
  Filled 2020-02-16 (×3): qty 1

## 2020-02-16 MED ORDER — OXCARBAZEPINE 300 MG PO TABS
600.0000 mg | ORAL_TABLET | Freq: Two times a day (BID) | ORAL | Status: DC
  Administered 2020-02-16 – 2020-02-17 (×2): 600 mg via ORAL
  Filled 2020-02-16 (×3): qty 2

## 2020-02-16 MED ORDER — CEPHALEXIN 500 MG PO CAPS: 500.0000 mg | ORAL_CAPSULE | Freq: Two times a day (BID) | ORAL | Status: DC

## 2020-02-16 MED ORDER — TIAGABINE HCL 4 MG PO TABS
4.0000 mg | ORAL_TABLET | Freq: Two times a day (BID) | ORAL | Status: DC
  Administered 2020-02-16 – 2020-02-17 (×3): 4 mg via ORAL
  Filled 2020-02-16 (×4): qty 1

## 2020-02-16 MED ORDER — PANTOPRAZOLE SODIUM 40 MG PO TBEC
40.0000 mg | DELAYED_RELEASE_TABLET | Freq: Two times a day (BID) | ORAL | Status: DC
  Administered 2020-02-16 – 2020-02-17 (×2): 40 mg via ORAL
  Filled 2020-02-16 (×2): qty 1

## 2020-02-16 MED ORDER — ORAL CARE MOUTH RINSE
15.0000 mL | Freq: Two times a day (BID) | OROMUCOSAL | Status: DC
  Administered 2020-02-17: 15 mL via OROMUCOSAL

## 2020-02-16 MED ORDER — FLUOXETINE HCL 10 MG PO CAPS
10.0000 mg | ORAL_CAPSULE | Freq: Every day | ORAL | Status: DC
  Administered 2020-02-17: 10 mg via ORAL
  Filled 2020-02-16: qty 1

## 2020-02-16 MED ORDER — ENSURE ENLIVE PO LIQD
237.0000 mL | Freq: Three times a day (TID) | ORAL | Status: DC
  Administered 2020-02-16 – 2020-02-17 (×3): 237 mL via ORAL

## 2020-02-16 NOTE — Evaluation (Signed)
Clinical/Bedside Swallow Evaluation Patient Details  Name: Alicia Duncan MRN: [id number] Date of Birth: 03-29-1976  Today's Date: 02/16/2020 Time: SLP Start Time (ACUTE ONLY): 1100 SLP Stop Time (ACUTE ONLY): 1200 SLP Time Calculation (min) (ACUTE ONLY): 60 min  Past Medical History:  Past Medical History:  Diagnosis Date  . Dystonia   . Rett syndrome   . Seizures (HCC)    Past Surgical History: No past surgical history on file. HPI:  Pt is a 44 y.o. female with past medical history of Rett syndrome, Cognitive decline/developmental status decline and noverbal, seizures, and dystonia who presents to the ED for seizure.  History is limited as patient is nonverbal at baseline and caregiver not currently present.  EMS reports they were told by caregiver patient has had multiple seizures over the past 45 minutes, each lasting between 3 to 6 minutes.  EMS did not note any seizure activity during transport and caregiver reported she was currently at her neurologic baseline.  She is nonverbal at baseline and has difficulty following commands.  Her caregiver apparently had been away for the past 2 days and was concerned that the patient's father, who she reported has dementia, had not been giving the patient her medications.  Patient currently takes Ativan, oxcarbazepine, and Gabitril for seizure control.  Caregiver was also concerned for UTI given strong odor to urine.     Assessment / Plan / Recommendation Clinical Impression  Pt has Baseline Oral phase Dysphagia as per previous assessment during admit. Currently, pt has a NG for meds, some TFs. At this BSE, pt appears to present at/near her baseline re: oropharyngeal swallowing. Per her baseline, pt exhibits oral phase deficits but w/ a modified diet and use of a Baby Bottle for drinking thin liquids, pt was able to consume po's of thin liquids and purees w/ no overt, clinical s/s of aspiration noted.  Pt presents w/ oral phase dysphagia but w/  support of feeding and use of a Baby Bottle for drinking liquids, as well as general aspiration precautions, she was able to consume po trials of a modified diet w/ no overt, clinical s/s of aspiration noted. Per chart notes/history, there is report of pt using a Baby Bottle for drinking liquids at home(w/ the Father). With the level of Cognitive/mental/developmental status decline Baseline, and the need for FULL, 100% Supervision/Support at all meals for feeding(all ADLs), pt is at risk for not meeting her nutrition/hydration needs adequately. Recommend Dietician f/u for needs. Suspect pt's oral phase dysphagia is directly impacted by her Baseline dx of Rett Syndrome and other Cognitive/Mental/Developmenta abilities decline(pt is a 44 y.o.femalewith medical history significant ofseizure disorder, mutism, dystonia, Cognitive decline/mental abilities decline, and Rett syndrome). During the oral phase, pt exhibited a more tongue forward/protrusion during bolus prep/acceptance w/ anterior loss occurring. W/ continued support during bolus delivery, pt accepted bolus trials (pureed foods) orally. Noted lingual smacking and min increased oral phase time for bolus management, A-P transfer, and overall oral clearing moreo w/ purees. W/ Baby Bottle drinking, pt exhibited an adequate labial seal around nipple then consecutive sucking to pull fluids from nipple and consume 3+ ozs of fluids, thin liquid consistency w/ No overt, clinical s/s of aspiration noted - no decline in respiratory status, no coughing/throat clearing. When pt did not want po's, she turned her head and put her hand up. OM exam cursory - poor dentition status but adequate lingual/labial strength/control/coordination to drink from a Baby Bottle.  Recommend a Puree diet (dysphagia level 1)  w/ thin liquids VIA Baby Bottle - Dr. Theora Gianotti w/ #4 nipple utilized this session -- similar flow nipples could be used. General aspiration precautions. Pills Crushed  and given in Puree; dissolved in liquids if completely dissolvable for Bottle. FULL, 100% Supervision/Support feeding at all meals. Dietician f/u for support. Pt appears to enjoy drinking Ensure at her baseline. Pt appears at her Baseline as per chart notes/presentation. Education completed w/ NSG; precautions posted and Baby Bottle/nipples left in room. MD updated. NSG to reconsult if new needs arise this admit. Father arrived post session and given education on pt's diet w/ use of the Baby Bottle; washing and care of the nipples.   SLP Visit Diagnosis: Dysphagia, oral phase (R13.11) (baseline Cognitive decline)    Aspiration Risk  Mild aspiration risk;Risk for inadequate nutrition/hydration (reduced following precautions)    Diet Recommendation  Dysphagia level 1 (puree) w/ Thin liquids use of Baby Bottle; general aspiration precautions. Feeding support at meals - reduce distractions.  Medication Administration: Crushed with puree    Other  Recommendations Recommended Consults:  (Dietician f/u) Oral Care Recommendations: Oral care BID;Oral care before and after PO;Staff/trained caregiver to provide oral care Other Recommendations:  (n/a)   Follow up Recommendations None      Frequency and Duration  (n/a)   (n/a)       Prognosis Prognosis for Safe Diet Advancement: Fair Barriers to Reach Goals: Cognitive deficits;Language deficits;Severity of deficits;Behavior;Time post onset      Swallow Study   General Date of Onset: 02/13/20 HPI: Pt is a 44 y.o. female with past medical history of Rett syndrome, Cognitive decline/developmental status decline and noverbal, seizures, and dystonia who presents to the ED for seizure.  History is limited as patient is nonverbal at baseline and caregiver not currently present.  EMS reports they were told by caregiver patient has had multiple seizures over the past 45 minutes, each lasting between 3 to 6 minutes.  EMS did not note any seizure activity  during transport and caregiver reported she was currently at her neurologic baseline.  She is nonverbal at baseline and has difficulty following commands.  Her caregiver apparently had been away for the past 2 days and was concerned that the patient's father, who she reported has dementia, had not been giving the patient her medications.  Patient currently takes Ativan, oxcarbazepine, and Gabitril for seizure control.  Caregiver was also concerned for UTI given strong odor to urine.   Type of Study: Bedside Swallow Evaluation Previous Swallow Assessment: 12/2019; 2018 Diet Prior to this Study: Dysphagia 1 (puree);Thin liquids (via Baby Bottle) Temperature Spikes Noted: No (wbc 9.2) Respiratory Status: Room air History of Recent Intubation: No Behavior/Cognition: Alert;Cooperative;Pleasant mood;Requires cueing;Doesn't follow directions (baseline Cognitive/developmental decline) Oral Cavity Assessment: Dry (min appearing) Oral Care Completed by SLP:  (attempted) Oral Cavity - Dentition: Poor condition;Missing dentition Vision: Functional for self-feeding (holds Baby Bottle to drink) Self-Feeding Abilities: Able to feed self (holds Baby Bottle) Patient Positioning: Postural control adequate for testing Baseline Vocal Quality:  (nonverbal) Volitional Cough: Cognitively unable to elicit Volitional Swallow: Unable to elicit    Oral/Motor/Sensory Function Overall Oral Motor/Sensory Function:  (appeared adequate for using Baby Bottle; spoon)   Ice Chips Ice chips: Not tested   Thin Liquid Thin Liquid: Within functional limits Presentation:  (Baby Bottle - ~3 ozs) Other Comments: baseline    Nectar Thick Nectar Thick Liquid: Not tested   Honey Thick Honey Thick Liquid: Not tested   Puree Puree: Impaired Presentation: Spoon (fed;  10 trials) Oral Phase Impairments: Reduced labial seal;Reduced lingual movement/coordination (min; increased lingual smacking ) Oral Phase Functional Implications:  Prolonged oral transit (min) Pharyngeal Phase Impairments:  (none)   Solid     Solid: Not tested        Jerilynn Som, MS, CCC-SLP Speech Language Pathologist Rehab Services [phone] Sherilee Smotherman 02/16/2020,3:17 PM

## 2020-02-16 NOTE — Progress Notes (Signed)
PROGRESS NOTE  Alicia Duncan TTS:[id number] DOB: Aug 06, 1975 DOA: 02/13/2020 PCP: [name], MD  HPI/Recap of past 24 hours: HPI from Dr Sedalia Muta Herbert Seta Alicia Duncan is a 44 y.o. female with medical history significant for seizure. Mr. [name] states that [name] developed seizures at approximately 9 AM, for approximately 1 hour.  Likely 2/2 noncompliance to medication due to caregiver not being available and father who possibly has some cognitive deficits.  Patient lives with father who is primary guardian and caregiver. Per ED provider, [name] has a Comptroller that comes regularly to help give her medications.  However ED provider states that a sitter has been out for the last 2 days and her father taking care of her.  It is unclear whether or not [name] received her seizure medications. ED provider states that patient was seizing for 45 minutes, when father called EMS.  Patient admitted for further management.    Today, appears to be in no distress, no further seizures noted.   Assessment/Plan: Principal Problem:   Seizure (HCC) Active Problems:   Rett syndrome   Seizure disorder (HCC)   Protein-calorie malnutrition, severe   Seizures (HCC)   Goals of care, counseling/discussion   Palliative care by specialist   DNR (do not resuscitate)  Breakthrough seizure History of seizures Likely 2/2 noncompliant Neurology consulted, restart home regimen with Gabitril and Lamictal via NG tube, Ativan as needed for seizure activity EEG with no active seizure activity Seizure precautions  UTI Currently afebrile, with no leukocytosis UA positive for UTI UC growing greater than 100,000 E. 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 Patient requires full-time care Caregiver out of town, led to this admission SW consulted  History of mutism Currently at baseline  GOC Pt with poor prognosis, developmental delay, severe malnutriton Palliative care consulted, made DNR         Malnutrition Type:  Nutrition Problem: Severe Malnutrition Etiology: social / environmental circumstances (suspected inadequate oral intake)   Malnutrition Characteristics:  Signs/Symptoms: severe fat depletion, severe muscle depletion, percent weight loss Percent weight loss: 19.3 %   Nutrition Interventions:  Interventions: Refer to RD note for recommendations    Estimated body mass index is 12.21 kg/m as calculated from the following:   Height as of this encounter: 5\' 1"  (1.549 m).   Weight as of this encounter: 29.3 kg.     Code Status: DNR  Family Communication: None at bedside  Disposition Plan: Status is: Inpatient  Remains inpatient appropriate because:Inpatient level of care appropriate due to severity of illness   Dispo: The patient is from: Home              Anticipated d/c is to: Home              Anticipated d/c date is: 1 day              Patient currently is not medically stable to d/c.    Consultants:  Neurology  Procedures:  None  Antimicrobials:  Ceftriaxone-->PO Keflex  DVT prophylaxis: Lovenox   Objective: Vitals:   02/16/20 0029 02/16/20 0447 02/16/20 0751 02/16/20 1216  BP: (!) 126/56  123/68 128/65  Pulse: 78 77 87 (!)  105  Resp: 16  20 20   Temp: 98.3 F (36.8 C) 98.8 F (37.1 C) 97.9 F (36.6 C) 98.2 F (36.8 C)  TempSrc: Oral Oral Axillary Oral  SpO2: 97% 98% 94% 100%  Weight:      Height:        Intake/Output Summary (Last 24 hours) at 02/16/2020 1402 Last data filed at 02/16/2020 0358 Gross per 24 hour  Intake 1746.58 ml  Output --  Net 1746.58 ml   Filed Weights   02/13/20 2011 02/14/20 0009   Weight: 36 kg 29.3 kg    Exam:  General: NAD, awake, alert, mute  Cardiovascular: S1, S2 present  Respiratory: CTAB  Abdomen: Soft, nontender, nondistended, bowel sounds present  Musculoskeletal: No bilateral pedal edema noted  Skin: Normal  Psychiatry:  Unable to assess   Data Reviewed: CBC: Recent Labs  Lab 02/13/20 1614 02/14/20 0046  WBC 7.3 9.2  NEUTROABS 4.7  --   HGB 13.7 12.1  HCT 42.9 39.3  MCV 86.0 89.1  PLT 126* 97*   Basic Metabolic Panel: Recent Labs  Lab 02/13/20 1614 02/14/20 0046 02/14/20 1659 02/15/20 0514 02/15/20 1637 02/16/20 0519  NA 142  --   --  133*  --  137  K 3.7  --   --  3.3*  --  3.8  CL 108  --   --  103  --  107  CO2 24  --   --  23  --  25  GLUCOSE 97  --   --  100*  --  118*  BUN 20  --   --  11  --  10  CREATININE 0.75  --   --  0.43*  --  0.44  CALCIUM 9.4  --   --  8.3*  --  8.1*  MG  --  1.9 1.8 1.9 1.8 1.9  PHOS  --   --  2.5 2.5 3.2 3.3   GFR: Estimated Creatinine Clearance: 41.1 mL/min (by C-G formula based on SCr of 0.44 mg/dL). Liver Function Tests: Recent Labs  Lab 02/13/20 1614  AST 39  ALT 29  ALKPHOS 56  BILITOT 0.7  PROT 7.9  ALBUMIN 4.2   No results for input(s): LIPASE, AMYLASE in the last 168 hours. No results for input(s): AMMONIA in the last 168 hours. Coagulation Profile: No results for input(s): INR, PROTIME in the last 168 hours. Cardiac Enzymes: No results for input(s): CKTOTAL, CKMB, CKMBINDEX, TROPONINI in the last 168 hours. BNP (last 3 results) No results for input(s): PROBNP in the last 8760 hours. HbA1C: No results for input(s): HGBA1C in the last 72 hours. CBG: Recent Labs  Lab 02/15/20 2100 02/16/20 0044 02/16/20 0448 02/16/20 0747 02/16/20 1153  GLUCAP 83 108* 95 111* 121*   Lipid Profile: No results for input(s): CHOL, HDL, LDLCALC, TRIG, CHOLHDL, LDLDIRECT in the last 72 hours. Thyroid Function Tests: No results for input(s): TSH, T4TOTAL, FREET4, T3FREE,  THYROIDAB in the last 72 hours. Anemia Panel: No results for input(s): VITAMINB12, FOLATE, FERRITIN, TIBC, IRON, RETICCTPCT in the last 72 hours. Urine analysis:    Component Value Date/Time   COLORURINE YELLOW (A) 02/13/2020 0455   APPEARANCEUR HAZY (A) 02/13/2020 0455   LABSPEC 1.023 02/13/2020 0455   PHURINE 5.0 02/13/2020 0455   GLUCOSEU NEGATIVE 02/13/2020 0455   HGBUR NEGATIVE 02/13/2020 0455   BILIRUBINUR NEGATIVE 02/13/2020 0455   KETONESUR 5 (A) 02/13/2020 0455   PROTEINUR NEGATIVE 02/13/2020 0455   UROBILINOGEN 1.0 04/25/2013  1427   NITRITE POSITIVE (A) 02/13/2020 0455   LEUKOCYTESUR NEGATIVE 02/13/2020 0455   Sepsis Labs: @LABRCNTIP (procalcitonin:4,lacticidven:4)  ) Recent Results (from the past 240 hour(s))  Respiratory Panel by RT PCR (Flu A&B, Covid) - Nasopharyngeal Swab     Status: None   Collection Time: 02/13/20  4:14 PM   Specimen: Nasopharyngeal Swab  Result Value Ref Range Status   SARS Coronavirus 2 by RT PCR NEGATIVE NEGATIVE Final    Comment: (NOTE) SARS-CoV-2 target nucleic acids are NOT DETECTED.  The SARS-CoV-2 RNA is generally detectable in upper respiratoy specimens during the acute phase of infection. The lowest concentration of SARS-CoV-2 viral copies this assay can detect is 131 copies/mL. A negative result does not preclude SARS-Cov-2 infection and should not be used as the sole basis for treatment or other patient management decisions. A negative result may occur with  improper specimen collection/handling, submission of specimen other than nasopharyngeal swab, presence of viral mutation(s) within the areas targeted by this assay, and inadequate number of viral copies (<131 copies/mL). A negative result must be combined with clinical observations, patient history, and epidemiological information. The expected result is Negative.  Fact Sheet for Patients:  13/08/21  Fact Sheet for Healthcare Providers:    https://www.moore.com/  This test is no t yet approved or cleared by the https://www.young.biz/ FDA and  has been authorized for detection and/or diagnosis of SARS-CoV-2 by FDA under an Emergency Use Authorization (EUA). This EUA will remain  in effect (meaning this test can be used) for the duration of the COVID-19 declaration under Section 564(b)(1) of the Act, 21 U.S.C. section 360bbb-3(b)(1), unless the authorization is terminated or revoked sooner.     Influenza A by PCR NEGATIVE NEGATIVE Final   Influenza B by PCR NEGATIVE NEGATIVE Final    Comment: (NOTE) The Xpert Xpress SARS-CoV-2/FLU/RSV assay is intended as an aid in  the diagnosis of influenza from Nasopharyngeal swab specimens and  should not be used as a sole basis for treatment. Nasal washings and  aspirates are unacceptable for Xpert Xpress SARS-CoV-2/FLU/RSV  testing.  Fact Sheet for Patients: [place]  Fact Sheet for Healthcare Providers: https://www.moore.com/  This test is not yet approved or cleared by the https://www.young.biz/ FDA and  has been authorized for detection and/or diagnosis of SARS-CoV-2 by  FDA under an Emergency Use Authorization (EUA). This EUA will remain  in effect (meaning this test can be used) for the duration of the  Covid-19 declaration under Section 564(b)(1) of the Act, 21  U.S.C. section 360bbb-3(b)(1), unless the authorization is  terminated or revoked. Performed at [facility], [address] [place], [place]   MRSA PCR Screening     Status: None   Collection Time: 02/14/20 12:08 AM   Specimen: Nasopharyngeal  Result Value Ref Range Status   MRSA by PCR NEGATIVE NEGATIVE Final    Comment:        The GeneXpert MRSA Assay (FDA approved for NASAL specimens only), is one component of a comprehensive MRSA colonization surveillance program. It is not intended to diagnose MRSA infection nor to guide  or monitor treatment for MRSA infections. Performed at [facility], [address] [place], [place]   Urine Culture     Status: Abnormal   Collection Time: 02/14/20  4:55 AM   Specimen: Urine, Random  Result Value Ref Range Status   Specimen Description   Final    URINE, RANDOM Performed at [facility], [address] [place], [place] [address]    Special Requests   Final    NONE Performed at [facility], [address] [place], [place] [address]    Culture >=100,000 COLONIES/mL ESCHERICHIA COLI (A)  Final   Report Status 02/16/2020 FINAL  Final   Organism ID, Bacteria ESCHERICHIA COLI (A)  Final      Susceptibility   Escherichia coli - MIC*    AMPICILLIN 4 SENSITIVE Sensitive     CEFAZOLIN <=4 SENSITIVE Sensitive     CEFEPIME <=0.12 SENSITIVE Sensitive     CEFTRIAXONE <=0.25 SENSITIVE Sensitive     CIPROFLOXACIN <=0.25 SENSITIVE Sensitive     GENTAMICIN <=1 SENSITIVE Sensitive     IMIPENEM <=0.25 SENSITIVE Sensitive     NITROFURANTOIN <=16 SENSITIVE Sensitive     TRIMETH/SULFA <=20 SENSITIVE Sensitive     AMPICILLIN/SULBACTAM <=2 SENSITIVE Sensitive     PIP/TAZO <=4 SENSITIVE Sensitive     * >=100,000 COLONIES/mL ESCHERICHIA COLI      Studies: No results found.  Scheduled Meds: . [START ON 02/17/2020] cephALEXin  500 mg Oral Q12H  . Chlorhexidine Gluconate Cloth  6 each Topical Q0600  . enoxaparin (LOVENOX) injection  20 mg Subcutaneous Q24H  . [START ON 02/17/2020] FLUoxetine  10 mg Oral Daily  . LORazepam  1 mg Oral BID  . Oxcarbazepine  600 mg Oral BID  . pantoprazole  40 mg Oral BID AC  . tiaGABine  4 mg Oral BID    Continuous Infusions: . feeding supplement (OSMOLITE 1.2 CAL) 45 mL/hr at 02/15/20 0430  . lactated ringers 50 mL/hr at 02/16/20 0615     LOS: 3 days     Briant CedarNkeiruka J Chamia Schmutz, MD [facility]  If 7PM-7AM, please contact night-coverage [email or web address] 02/16/2020, 2:02 PM

## 2020-02-16 NOTE — Progress Notes (Signed)
Nutrition Follow-up  DOCUMENTATION CODES:   Severe malnutrition in context of social or environmental circumstances, Underweight  INTERVENTION:  Provide Ensure Enlive po TID, each supplement provides 350 kcal and 20 grams of protein. Patient drinks through a baby bottle.  NUTRITION DIAGNOSIS:   Severe Malnutrition related to social / environmental circumstances (suspected inadequate oral intake) as evidenced by severe fat depletion, severe muscle depletion, percent weight loss.  Ongoing.  GOAL:   Patient will meet greater than or equal to 90% of their needs  Met with TF regimen.  MONITOR:   PO intake, Labs, Weight trends, TF tolerance, I & O's  REASON FOR ASSESSMENT:   Consult Assessment of nutrition requirement/status, Enteral/tube feeding initiation and management  ASSESSMENT:   44 year old female with PMHx of Rett syndrome, seizures admitted with breakthrough seizures.  11/11 tube feeds initiated  Met with patient at bedside this morning. Patient is nonverbal. Air cabin crew present in room. Patient's tube feeds were infusing at goal rate via NGT. Patient had mitts in place and appears uncomfortable with NGT in place as she continues to put her hand up to tube. Following RD assessment patient was assessed by SLP and diet was advanced to dysphagia 1 with thin liquids.  Medications reviewed and include: Protonix, ceftriaxone, LR at 50 mL/hr.  Labs reviewed: CBG 95-121.  Enteral Access: 14 Fr. NGT placed 11/8; terminates in gastric body per chest x-ray 11/9  TF regimen: Osmolite 1.2 Cal at 45 mL/hr  I/O: 1 occurrence unmeasured UOP yesterday; 2 occurrences BMs yesterday  No weight to trend since 11/9  Discussed with RN and MD via secure chat. Plan is to remove NGT now that diet is being advanced. Palliative Medicine has been consulted to discuss goals of care.  Diet Order:   Diet Order            DIET - DYS 1 Room service appropriate? Yes with Assist; Fluid  consistency: Thin  Diet effective now                EDUCATION NEEDS:   No education needs have been identified at this time  Skin:  Skin Assessment: Reviewed RN Assessment  Last BM:  02/16/2020 - medium type 6  Height:   Ht Readings from Last 1 Encounters:  02/14/20 5' 1" (1.549 m)   Weight:   Wt Readings from Last 1 Encounters:  02/14/20 29.3 kg   BMI:  Body mass index is 12.21 kg/m.  Estimated Nutritional Needs:   Kcal:  1300-1500  Protein:  50-60 grams  Fluid:  1.2 L/day  Jacklynn Barnacle, MS, RD, LDN Pager number available on Amion

## 2020-02-16 NOTE — Progress Notes (Signed)
PHARMACIST - PHYSICIAN COMMUNICATION  DR:   Sharolyn Douglas  CONCERNING: IV to Oral Route Change Policy  RECOMMENDATION: This patient is receiving lorazepam/protonix by the intravenous route.  Based on criteria approved by the Pharmacy and Therapeutics Committee, the intravenous medication(s) is/are being converted to the equivalent oral dose form(s).   DESCRIPTION: These criteria include:  The patient is eating (either orally or via tube) and/or has been taking other orally administered medications for a least 24 hours  The patient has no evidence of active gastrointestinal bleeding or impaired GI absorption (gastrectomy, short bowel, patient on TNA or NPO).  If you have questions about this conversion, please contact the Pharmacy Department  []   [phone] )  ( [phone] [x]   [phone] )  [facility] []   [phone] )  [facility] []   [phone] )  [facility] []   [phone] )  [facility]   ( [phone], PharmD, BCPS Clinical Pharmacist 02/16/2020 12:02 PM

## 2020-02-16 NOTE — Plan of Care (Signed)
  Problem: Education: Goal: Knowledge of General Education information will improve Description: Including pain rating scale, medication(s)/side effects and non-pharmacologic comfort measures 02/16/2020 2120 by Raford Pitcher, RN Outcome: Progressing 02/16/2020 2120 by Raford Pitcher, RN Outcome: Progressing   Problem: Health Behavior/Discharge Planning: Goal: Ability to manage health-related needs will improve 02/16/2020 2120 by Raford Pitcher, RN Outcome: Progressing 02/16/2020 2120 by Raford Pitcher, RN Outcome: Progressing   Problem: Clinical Measurements: Goal: Ability to maintain clinical measurements within normal limits will improve 02/16/2020 2120 by Raford Pitcher, RN Outcome: Progressing 02/16/2020 2120 by Raford Pitcher, RN Outcome: Progressing Goal: Will remain free from infection 02/16/2020 2120 by Raford Pitcher, RN Outcome: Progressing 02/16/2020 2120 by Raford Pitcher, RN Outcome: Progressing Goal: Diagnostic test results will improve 02/16/2020 2120 by Raford Pitcher, RN Outcome: Progressing 02/16/2020 2120 by Raford Pitcher, RN Outcome: Progressing Goal: Respiratory complications will improve 02/16/2020 2120 by Raford Pitcher, RN Outcome: Progressing 02/16/2020 2120 by Raford Pitcher, RN Outcome: Progressing Goal: Cardiovascular complication will be avoided 02/16/2020 2120 by Raford Pitcher, RN Outcome: Progressing 02/16/2020 2120 by Raford Pitcher, RN Outcome: Progressing   Problem: Activity: Goal: Risk for activity intolerance will decrease 02/16/2020 2120 by Raford Pitcher, RN Outcome: Progressing 02/16/2020 2120 by Raford Pitcher, RN Outcome: Progressing   Problem: Nutrition: Goal: Adequate nutrition will be maintained 02/16/2020 2120 by Raford Pitcher, RN Outcome: Progressing 02/16/2020 2120 by Raford Pitcher, RN Outcome: Progressing   Problem: Coping: Goal: Level of anxiety will decrease 02/16/2020  2120 by Raford Pitcher, RN Outcome: Progressing 02/16/2020 2120 by Raford Pitcher, RN Outcome: Progressing   Problem: Elimination: Goal: Will not experience complications related to bowel motility 02/16/2020 2120 by Raford Pitcher, RN Outcome: Progressing 02/16/2020 2120 by Raford Pitcher, RN Outcome: Progressing Goal: Will not experience complications related to urinary retention 02/16/2020 2120 by Raford Pitcher, RN Outcome: Progressing 02/16/2020 2120 by Raford Pitcher, RN Outcome: Progressing   Problem: Pain Managment: Goal: General experience of comfort will improve 02/16/2020 2120 by Raford Pitcher, RN Outcome: Progressing 02/16/2020 2120 by Raford Pitcher, RN Outcome: Progressing   Problem: Safety: Goal: Ability to remain free from injury will improve 02/16/2020 2120 by Raford Pitcher, RN Outcome: Progressing 02/16/2020 2120 by Raford Pitcher, RN Outcome: Progressing   Problem: Skin Integrity: Goal: Risk for impaired skin integrity will decrease 02/16/2020 2120 by Raford Pitcher, RN Outcome: Progressing 02/16/2020 2120 by Raford Pitcher, RN Outcome: Progressing   Problem: Medication: Goal: Risk for medication side effects will decrease 02/16/2020 2120 by Raford Pitcher, RN Outcome: Progressing 02/16/2020 2120 by Raford Pitcher, RN Outcome: Progressing   Problem: Self-Concept: Goal: Level of anxiety will decrease 02/16/2020 2120 by Raford Pitcher, RN Outcome: Progressing 02/16/2020 2120 by Raford Pitcher, RN Outcome: Progressing Goal: Ability to verbalize feelings about condition will improve 02/16/2020 2120 by Raford Pitcher, RN Outcome: Progressing 02/16/2020 2120 by Raford Pitcher, RN Outcome: Progressing

## 2020-02-16 NOTE — Plan of Care (Signed)
PT NG tube removed. Pt tolerated well. Pt tolerating PO intake. Medications crushed in ice cream. Ensure and fluids given via baby bottle. 1:1 safety sitter at beside.  

## 2020-02-17 ENCOUNTER — Other Ambulatory Visit: Payer: Self-pay

## 2020-02-17 DIAGNOSIS — G40909 Epilepsy, unspecified, not intractable, without status epilepticus: Secondary | ICD-10-CM

## 2020-02-17 LAB — GLUCOSE, CAPILLARY
Glucose-Capillary: 77 mg/dL (ref 70–99)
Glucose-Capillary: 78 mg/dL (ref 70–99)
Glucose-Capillary: 78 mg/dL (ref 70–99)
Glucose-Capillary: 88 mg/dL (ref 70–99)

## 2020-02-17 MED ORDER — CEPHALEXIN 500 MG PO CAPS: 500.0000 mg | ORAL_CAPSULE | Freq: Two times a day (BID) | ORAL | 0 refills | Status: AC

## 2020-02-17 NOTE — TOC Progression Note (Signed)
Transition of Care [facility]) - Progression Note    Patient Details  Name: Alicia Duncan MRN: [id number] Date of Birth: 1976-03-20  [facility]) CM/SW Contact  Liliana Cline, LCSW Phone Number: 02/17/2020, 9:13 AM  Clinical Narrative:   Called patient's brother/Guardian Alicia Duncan about patient discharging today. Asked about transport. Alicia Duncan reported typically they set up EMS transport home and that there is noone available to transport patient home today. Alicia Duncan is calling his dad to confirm he knows that patient is discharging today and will be home. Alicia Duncan to call CSW back after he talks to his dad.    Expected Discharge Plan: Home/Self Care (home with aide services) Barriers to Discharge: Continued Medical Work up  Expected Discharge Plan and Services Expected Discharge Plan: Home/Self Care (home with aide services)       Living arrangements for the past 2 months: Single Family Home Expected Discharge Date: 02/17/20                                     Social Determinants of Health (SDOH) Interventions    Readmission Risk Interventions No flowsheet data found.

## 2020-02-17 NOTE — TOC Transition Note (Signed)
Transition of Care [facility]) - CM/SW Discharge Note   Patient Details  Name: Alicia Duncan MRN: [id number] Date of Birth: Nov 06, 1975  Transition of Care [facility]) CM/SW Contact:  Alicia Cline, LCSW Phone Number: 02/17/2020, 12:10 PM   Clinical Narrative:   Patient's Legal Guardian confirmed patient's father, brother Alicia Duncan, and sitter Alicia Duncan are all at the house and ready for patient to discharge home. [facility] transport arranged for 2:00 pick up. RN aware.     Final next level of care: Home w Home Health Services Barriers to Discharge: Barriers Resolved   Patient Goals and CMS Choice Patient states their goals for this hospitalization and ongoing recovery are:: to return home CMS Medicare.gov Compare Post Acute Care list provided to:: Patient Represenative (must comment) Choice offered to / list presented to : [facility] POA / Guardian  Discharge Placement                Patient to be transferred to facility by: EMS Name of family member notified: Genelle Bal Patient and family notified of of transfer: 02/17/20  Discharge Plan and Services                                     Social Determinants of Health (SDOH) Interventions     Readmission Risk Interventions No flowsheet data found.

## 2020-02-17 NOTE — Progress Notes (Addendum)
APS report made to [facility] DSS Intake Worker Rolly Salter due to staff concerns of patient's supervision/needs when Ophelia Charter is not there.  Alfonso Ramus, [place] [phone]

## 2020-02-17 NOTE — Care Management Important Message (Signed)
Important Message  Patient Details  Name: Alicia Duncan MRN: [id number] Date of Birth: 01-23-1976   Medicare Important Message Given:  Yes  Talked with Jerline Linzy, legal guardian [phone]) for [place] and reviewed the Important Message from Medicare over the phone.  Stated he understood the patient rights for the appeal process and said he was in agreement with the discharge.   [name] A Nedda Gains 02/17/2020, 11:19 AM

## 2020-02-17 NOTE — Discharge Summary (Signed)
Discharge Summary  Alicia Duncan:[id number] DOB: 25-Oct-1975  PCP: [name], MD  Admit date: 02/13/2020 Discharge date: 02/17/2020  Time spent: 40 mins   Recommendations for Outpatient Follow-up:  1. PCP in 1 week   Discharge Diagnoses:  Active Hospital Problems   Diagnosis Date Noted  . Seizure (HCC) 04/25/2013  . Goals of care, counseling/discussion   . Palliative care by specialist   . DNR (do not resuscitate)   . Seizures (HCC) 02/13/2020  . Protein-calorie malnutrition, severe 12/20/2019  . 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 Seizure disorder (HCC) 05/11/2016    Resolved Hospital Problems  No resolved problems to display.    Discharge Condition: Stable  Diet recommendation: Dysphagia type I via feeding bottle  Vitals:   02/17/20 0413 02/17/20 0748  BP: 95/67 123/80  Pulse: 72 96  Resp: 16 17  Temp: 98.3 F (36.8 C) 97.8 F (36.6 C)  SpO2: 99% 100%    History of present illness:  Alicia Duncan a 44 y.o.femalewith medical history significant forseizure. Mr. Roda Shutters that Heatherdeveloped seizures at approximately 9 AM, for approximately 1 hour.  Likely 2/2 noncompliance to medication due to caregiver not being available and father who possibly has some cognitive deficits.  Patient lives with father who is primary guardian and caregiver. Per ED provider, Ms. Mckensie Scotti has a Comptroller that comes regularly to help give her medications. However ED provider states that asitter has been out for the last 2 days and her father taking care of her. It is unclear whether or not Shanavia received her seizure medications. ED provider states that patient was seizing for 45 minutes, when father called EMS.  Patient admitted for further management.    Today, patient appears to be in no distress, no further seizures noted throughout hospitalization.  Patient stable to be discharged home, which family members are present and have 24/7 care for patient.      Hospital Course:  Principal Problem:   Seizure [facility]) Active Problems:   Rett syndrome   Seizure disorder (HCC)   Protein-calorie malnutrition, severe   Seizures (HCC)   Goals of care, counseling/discussion   Palliative care by specialist   DNR (do not resuscitate)   Breakthrough seizure History of seizures Likely 2/2 noncompliant Neurology consulted, restart home regimen with Gabitril and Lamictal, Ativan as needed for seizure activity EEG with no active seizure activity Follow-up with PCP  UTI Currently afebrile, with no leukocytosis UA positive for UTI UC growing greater than 100,000 E. 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   Weight as of this encounter: 34.8  kg.    Procedures:  None  Consultations:  Neurology  Discharge Exam: BP 123/80 (BP Location: Left Arm)   Pulse 96   Temp 97.8 F (36.6 C)   Resp 17   Ht 5\' 1"   (1.549 m)   Wt 34.8 kg   SpO2 100%   BMI 14.51 kg/m   General: NAD, awake, alert, mute Cardiovascular: S1, S2 present Respiratory: CTA B Abdomen: Soft, nontender, nondistended, bowel sounds present    Discharge Instructions You were cared for by a hospitalist during your hospital stay. If you have any questions about your discharge medications or the care you received while you were in the hospital after you are discharged, you can call the unit and asked to speak with the hospitalist on call if the hospitalist that took care of you is not available. Once you are discharged, your primary care physician will handle any further medical issues. Please note that NO REFILLS for any discharge medications will be authorized once you are discharged, as it is imperative that you return to your primary care physician (or establish a relationship with a primary care physician if you do not have one) for your aftercare needs so that they can reassess your need for medications and monitor your lab values.  Discharge Instructions    Diet - low sodium heart healthy   Complete by: As directed    Continue dysphagia 1 via feeding bottle     Allergies as of 02/17/2020      Reactions   Benadryl [diphenhydramine]    Penicillins Other (See Comments)   Has patient had a PCN reaction causing immediate rash, facial/tongue/throat swelling, SOB or lightheadedness with hypotension: Unknown Has patient had a PCN reaction causing severe rash involving mucus membranes or skin necrosis: Unknown Has patient had a PCN reaction that required hospitalization: Unknown Has patient had a PCN reaction occurring within the last 10 years: Unknown If all of the above answers are "NO", then may proceed with Cephalosporin use.      Medication List    TAKE these medications   baclofen 10 MG tablet Commonly known as: LIORESAL Take 10 mg by mouth 3 (three) times daily.   cephALEXin 500 MG capsule Commonly known as:  KEFLEX Take 1 capsule (500 mg total) by mouth every 12 (twelve) hours for 3 days.   FLUoxetine 10 MG capsule Commonly known as: PROZAC Take 10 mg by mouth daily.   LORazepam 1 MG tablet Commonly known as: ATIVAN Take 1 tablet (1 mg total) by mouth 2 (two) times daily.   omeprazole 20 MG capsule Commonly known as: PRILOSEC Take 20 mg by mouth daily.   Oxcarbazepine 300 MG tablet Commonly known as: TRILEPTAL TAKE 2 TABLETS (600 MG TOTAL) BY MOUTH 2 (TWO) TIMES DAILY.   tiaGABine 4 MG tablet Commonly known as: GABITRIL TAKE 1 TABLET (4 MG TOTAL) BY MOUTH 2 (TWO) TIMES DAILY. What changed: See the new instructions.      Allergies  Allergen Reactions  . Benadryl [Diphenhydramine]   . Penicillins Other (See Comments)    Has patient had a PCN reaction causing immediate rash, facial/tongue/throat swelling, SOB or lightheadedness with hypotension: Unknown Has patient had a PCN reaction causing severe rash involving mucus membranes or skin necrosis: Unknown Has patient had a PCN reaction that required hospitalization: Unknown Has patient had a PCN reaction occurring within the last 10 years: Unknown If all of the above answers are "NO", then may proceed with Cephalosporin use.  Follow-up Information    [name], MD. Schedule an appointment as soon as possible for a visit in 1 week(s).   Specialty: Family Medicine Contact information: [address] [facility] [place] [address] [phone]                The results of significant diagnostics from this hospitalization (including imaging, microbiology, ancillary and laboratory) are listed below for reference.    Significant Diagnostic Studies: EEG  Result Date: 02/14/2020 Charlsie Quest, MD     02/14/2020  5:02 PM Patient Name: CICELY ORTNER MRN: [id number] Epilepsy Attending: Charlsie Quest Referring Physician/Provider: Dr Londell Moh Date: 02/14/2020 Duration: 25.39 mins Patient history: 44 yo  F with rett syndrome and breakthrough seizures. EEG to evaluate for seizure Level of alertness: Awake AEDs during EEG study: OXC, LRZ Technical aspects: This EEG study was done with scalp electrodes positioned according to the 10-20 International system of electrode placement. Electrical activity was acquired at a sampling rate of 500Hz  and reviewed with a high frequency filter of 70Hz  and a low frequency filter of 1Hz . EEG data were recorded continuously and digitally stored. Description: No posterior dominant rhythm was seen. EEG showed continuous generalized 3 to 6 Hz theta-delta slowing admixed with 15-18Hz  generalized beta activity.  Hyperventilation and photic stimulation were not performed.   ABNORMALITY -Continuous slow, generalized IMPRESSION: This study is suggestive of moderate diffuse encephalopathy, nonspecific etiology. No seizures or epileptiform discharges were seen throughout the recording.   CT Head Wo Contrast  Result Date: 02/13/2020 CLINICAL DATA:  44 year old female status post multiple seizures today. EXAM: CT HEAD WITHOUT CONTRAST TECHNIQUE: Contiguous axial images were obtained from the base of the skull through the vertex without intravenous contrast. COMPARISON:  Head CT 12/17/2019 and earlier. FINDINGS: Brain: Stable chronic cerebral volume loss and mildly asymmetric subarachnoid CSF along the right frontal convexity. No midline shift, ventriculomegaly, mass effect, evidence of mass lesion, intracranial hemorrhage or evidence of cortically based acute infarction. Gray-white matter differentiation is within normal limits throughout the brain. Vascular: No suspicious intracranial vascular hyperdensity. Skull: Stable visualized osseous structures. Sinuses/Orbits: Visualized paranasal sinuses and mastoids are stable and well pneumatized. Other: Visualized orbits and scalp soft tissues are within normal limits. IMPRESSION: Stable noncontrast CT appearance of the brain,  negative aside from chronic volume loss Electronically Signed   By: 13/11/2019 M.D.   On: 02/13/2020 18:19   DG Chest Port 1 View  Result Date: 02/14/2020 CLINICAL DATA:  Hypoxia.  Possible aspiration. EXAM: PORTABLE CHEST 1 VIEW COMPARISON:  One-view chest x-ray 12/15/2019 at 12:02 a.m. FINDINGS: OG tube is in place. Heart is enlarged. New left-sided airspace opacities are present. Right lung is clear. Postoperative changes are present in the spine. Remote right clavicle and rib fractures are present. IMPRESSION: 1. New left-sided airspace disease compatible with pneumonia or aspiration. 2. Cardiomegaly without failure. 3. OG tube in place. Electronically Signed   By: 13/11/2019 M.D.   On: 02/14/2020 10:07   DG Chest Port 1 View  Result Date: 02/14/2020 CLINICAL DATA:  NG tube placement, history of seizures EXAM: PORTABLE CHEST 1 VIEW COMPARISON:  Radiograph 02/13/2020 FINDINGS: Interval placement of a transesophageal tube with the tip and side port terminating in the left upper quadrant, below the GE junction. Likely within the gastric body. Telemetry leads and external support devices overlie the patient as well. Redemonstration of the long segment thoracolumbar fusion for a chronic dextroconvex thoracic scoliosis. Some  low volumes and atelectasis are noted. Stable cardiomediastinal contours. Chronic right lower rib deformities are similar to prior. Healed right clavicular deformity is also unchanged. No acute osseous or soft tissue abnormality. IMPRESSION: Interval placement of a transesophageal tube with the tip and side port terminating in the left upper quadrant, below the GE junction. No other significant interval change. Electronically Signed   By: Kreg Shropshire M.D.   On: 02/14/2020 00:20   DG Chest Port 1 View  Result Date: 02/13/2020 CLINICAL DATA:  44 year old female status post seizure today. Hypoxia on room air. EXAM: PORTABLE CHEST 1 VIEW COMPARISON:  Portable chest 1611 hours  today.  Chest CT 05/12/2016. FINDINGS: Portable AP supine view at 2131 hours. Chronic dextroconvex thoracic scoliosis and posterior spinal rods. Lung volumes and mediastinal contours appear stable since the 2018 CT. No pneumothorax or pleural effusion is evident. Allowing for portable technique the lungs are clear. Stable visible bowel gas pattern. Chronic right clavicle deformity. Chronic right lower posterior rib deformities. Stable visualized osseous structures. IMPRESSION: No acute cardiopulmonary abnormality. Electronically Signed   By: Odessa Fleming M.D.   On: 02/13/2020 21:41   DG Chest Portable 1 View  Result Date: 02/13/2020 CLINICAL DATA:  Cough, seizures EXAM: PORTABLE CHEST 1 VIEW COMPARISON:  12/17/2019 FINDINGS: Single frontal view of the chest demonstrates a stable cardiac silhouette. Chronic scarring throughout the lungs without airspace disease, effusion, or pneumothorax. Stable postsurgical changes from spinal fusion. IMPRESSION: 1. Stable exam, no acute process. Electronically Signed   By: Sharlet Salina M.D.   On: 02/13/2020 16:41   DG Abd Portable 1V  Result Date: 02/15/2020 CLINICAL DATA:  NG tube placement EXAM: PORTABLE ABDOMEN - 1 VIEW COMPARISON:  February 14, 2020 FINDINGS: The NG tube appears grossly well position with the tip projecting over the gastric body. There is extensive gaseous distention of loops of small bowel and colon scattered throughout the abdomen. Fusion hardware is noted throughout the lumbar spine. IMPRESSION: 1. NG tube as above. 2. Persistent gaseous distention of small bowel and colon. Electronically Signed   By: Katherine Mantle M.D.   On: 02/15/2020 03:54   DG Abd Portable 1V  Result Date: 02/14/2020 CLINICAL DATA:  Enteric catheter placement EXAM: PORTABLE ABDOMEN - 1 VIEW COMPARISON:  02/14/2020 at 9:36 p.m. FINDINGS: Two upright frontal views of the abdomen and pelvis are obtained. Enteric catheter is seen previously has been removed in the interim. I do  not identify an enteric catheter on this exam. There is improved aeration at the left lung base since prior study. Lung bases are clear. Diffuse gaseous distention of the bowel is noted without evidence of obstruction. No free gas in the greater peritoneal sac. Stable spinal fusion rods. IMPRESSION: 1. Previous enteric catheter has been removed, and no enteric catheter is identified on this exam. 2. Improved aeration at the left lung base. 3. Nonspecific gaseous distention of the bowel without evidence of high-grade obstruction. Electronically Signed   By: Sharlet Salina M.D.   On: 02/14/2020 23:24    Microbiology: Recent Results (from the past 240 hour(s))  Respiratory Panel by RT PCR (Flu A&B, Covid) - Nasopharyngeal Swab     Status: None   Collection Time: 02/13/20  4:14 PM   Specimen: Nasopharyngeal Swab  Result Value Ref Range Status   SARS Coronavirus 2 by RT PCR NEGATIVE NEGATIVE Final    Comment: (NOTE) SARS-CoV-2 target nucleic acids are NOT DETECTED.  The SARS-CoV-2 RNA is generally detectable in upper respiratoy specimens  during the acute phase of infection. The lowest concentration of SARS-CoV-2 viral copies this assay can detect is 131 copies/mL. A negative result does not preclude SARS-Cov-2 infection and should not be used as the sole basis for treatment or other patient management decisions. A negative result may occur with  improper specimen collection/handling, submission of specimen other than nasopharyngeal swab, presence of viral mutation(s) within the areas targeted by this assay, and inadequate number of viral copies (<131 copies/mL). A negative result must be combined with clinical observations, patient history, and epidemiological information. The expected result is Negative.  Fact Sheet for Patients:  https://www.moore.com/  Fact Sheet for Healthcare Providers:  https://www.young.biz/  This test is no t yet approved or  cleared by the [place] FDA and  has been authorized for detection and/or diagnosis of SARS-CoV-2 by FDA under an Emergency Use Authorization (EUA). This EUA will remain  in effect (meaning this test can be used) for the duration of the COVID-19 declaration under Section 564(b)(1) of the Act, 21 U.S.C. section 360bbb-3(b)(1), unless the authorization is terminated or revoked sooner.     Influenza A by PCR NEGATIVE NEGATIVE Final   Influenza B by PCR NEGATIVE NEGATIVE Final    Comment: (NOTE) The Xpert Xpress SARS-CoV-2/FLU/RSV assay is intended as an aid in  the diagnosis of influenza from Nasopharyngeal swab specimens and  should not be used as a sole basis for treatment. Nasal washings and  aspirates are unacceptable for Xpert Xpress SARS-CoV-2/FLU/RSV  testing.  Fact Sheet for Patients: https://www.moore.com/  Fact Sheet for Healthcare Providers: https://www.young.biz/  This test is not yet approved or cleared by the [place] FDA and  has been authorized for detection and/or diagnosis of SARS-CoV-2 by  FDA under an Emergency Use Authorization (EUA). This EUA will remain  in effect (meaning this test can be used) for the duration of the  Covid-19 declaration under Section 564(b)(1) of the Act, 21  U.S.C. section 360bbb-3(b)(1), unless the authorization is  terminated or revoked. Performed at [facility], [address] [place], [place] [address]   MRSA PCR Screening     Status: None   Collection Time: 02/14/20 12:08 AM   Specimen: Nasopharyngeal  Result Value Ref Range Status   MRSA by PCR NEGATIVE NEGATIVE Final    Comment:        The GeneXpert MRSA Assay (FDA approved for NASAL specimens only), is one component of a comprehensive MRSA colonization surveillance program. It is not intended to diagnose MRSA infection nor to guide or monitor treatment for MRSA infections. Performed at [facility],  [address] [place], [place] [address]   Urine Culture     Status: Abnormal   Collection Time: 02/14/20  4:55 AM   Specimen: Urine, Random  Result Value Ref Range Status   Specimen Description   Final    URINE, RANDOM Performed at [facility], [address] [place], [place] [address]    Special Requests   Final    NONE Performed at [facility] , [address] [place], [place] [address]    Culture >=100,000 COLONIES/mL ESCHERICHIA COLI (A)  Final   Report Status 02/16/2020 FINAL  Final   Organism ID, Bacteria ESCHERICHIA COLI (A)  Final      Susceptibility   Escherichia coli - MIC*    AMPICILLIN 4 SENSITIVE Sensitive     CEFAZOLIN <=4 SENSITIVE Sensitive     CEFEPIME <=0.12 SENSITIVE Sensitive     CEFTRIAXONE <=0.25  SENSITIVE Sensitive     CIPROFLOXACIN <=0.25 SENSITIVE Sensitive     GENTAMICIN <=1 SENSITIVE Sensitive     IMIPENEM <=0.25 SENSITIVE Sensitive     NITROFURANTOIN <=16 SENSITIVE Sensitive     TRIMETH/SULFA <=20 SENSITIVE Sensitive     AMPICILLIN/SULBACTAM <=2 SENSITIVE Sensitive     PIP/TAZO <=4 SENSITIVE Sensitive     * >=100,000 COLONIES/mL ESCHERICHIA COLI     Labs: Basic Metabolic Panel: Recent Labs  Lab 02/13/20 1614 02/14/20 0046 02/14/20 1659 02/15/20 0514 02/15/20 1637 02/16/20 0519  NA 142  --   --  133*  --  137  K 3.7  --   --  3.3*  --  3.8  CL 108  --   --  103  --  107  CO2 24  --   --  23  --  25  GLUCOSE 97  --   --  100*  --  118*  BUN 20  --   --  11  --  10  CREATININE 0.75  --   --  0.43*  --  0.44  CALCIUM 9.4  --   --  8.3*  --  8.1*  MG  --  1.9 1.8 1.9 1.8 1.9  PHOS  --   --  2.5 2.5 3.2 3.3   Liver Function Tests: Recent Labs  Lab 02/13/20 1614  AST 39  ALT 29  ALKPHOS 56  BILITOT 0.7  PROT 7.9  ALBUMIN 4.2   No results for input(s): LIPASE, AMYLASE in the last 168 hours. No results for input(s): AMMONIA in the last 168 hours. CBC: Recent Labs  Lab 02/13/20 1614 02/14/20 0046  WBC  7.3 9.2  NEUTROABS 4.7  --   HGB 13.7 12.1  HCT 42.9 39.3  MCV 86.0 89.1  PLT 126* 97*   Cardiac Enzymes: No results for input(s): CKTOTAL, CKMB, CKMBINDEX, TROPONINI in the last 168 hours. BNP: BNP (last 3 results) No results for input(s): BNP in the last 8760 hours.  ProBNP (last 3 results) No results for input(s): PROBNP in the last 8760 hours.  CBG: Recent Labs  Lab 02/16/20 1959 02/17/20 0018 02/17/20 0410 02/17/20 0742 02/17/20 1144  GLUCAP 93 88 [phone]       Signed:  Briant CedarNkeiruka J Amalio Loe, MD [facility] 02/17/2020, 2:37 PM

## 2020-02-17 NOTE — Progress Notes (Signed)
Text paged and MD twice and paged for d/c reconciliation and need DNR signed

## 2020-02-17 NOTE — Plan of Care (Signed)
  Problem: Education: Goal: Knowledge of General Education information will improve Description: Including pain rating scale, medication(s)/side effects and non-pharmacologic comfort measures Outcome: Adequate for Discharge   Problem: Health Behavior/Discharge Planning: Goal: Ability to manage health-related needs will improve Outcome: Adequate for Discharge   Problem: Clinical Measurements: Goal: Ability to maintain clinical measurements within normal limits will improve Outcome: Adequate for Discharge Goal: Will remain free from infection Outcome: Adequate for Discharge Goal: Diagnostic test results will improve Outcome: Adequate for Discharge Goal: Respiratory complications will improve Outcome: Adequate for Discharge Goal: Cardiovascular complication will be avoided Outcome: Adequate for Discharge   Problem: Clinical Measurements: Goal: Will remain free from infection Outcome: Adequate for Discharge   Problem: Clinical Measurements: Goal: Diagnostic test results will improve Outcome: Adequate for Discharge   Problem: Clinical Measurements: Goal: Respiratory complications will improve Outcome: Adequate for Discharge   

## 2020-02-21 ENCOUNTER — Other Ambulatory Visit: Payer: Self-pay | Admitting: Neurology

## 2020-03-03 ENCOUNTER — Other Ambulatory Visit: Payer: Self-pay | Admitting: Neurology

## 2020-03-14 ENCOUNTER — Other Ambulatory Visit: Payer: Self-pay | Admitting: Neurology

## 2020-03-15 ENCOUNTER — Telehealth: Payer: Self-pay | Admitting: Neurology

## 2020-03-15 ENCOUNTER — Ambulatory Visit (INDEPENDENT_AMBULATORY_CARE_PROVIDER_SITE_OTHER): Payer: Medicare Other | Admitting: Neurology

## 2020-03-15 ENCOUNTER — Encounter: Payer: Self-pay | Admitting: Neurology

## 2020-03-15 VITALS — BP 118/72 | HR 74 | Ht 61.0 in

## 2020-03-15 DIAGNOSIS — G40909 Epilepsy, unspecified, not intractable, without status epilepticus: Secondary | ICD-10-CM

## 2020-03-15 DIAGNOSIS — F842 Rett's syndrome: Secondary | ICD-10-CM

## 2020-03-15 MED ORDER — OXCARBAZEPINE 300 MG PO TABS: 600.0000 mg | ORAL_TABLET | Freq: Two times a day (BID) | ORAL | 5 refills | Status: DC

## 2020-03-15 MED ORDER — LORAZEPAM 1 MG PO TABS: 1.0000 mg | ORAL_TABLET | Freq: Two times a day (BID) | ORAL | 3 refills | Status: DC

## 2020-03-15 MED ORDER — TIAGABINE HCL 4 MG PO TABS: 4.0000 mg | ORAL_TABLET | Freq: Two times a day (BID) | ORAL | 5 refills | Status: DC

## 2020-03-15 NOTE — Telephone Encounter (Signed)
Can you try to get in touch with [name].  Alicia Duncan has a current aide who is with her 7 days a week, 10 hours a day, I have been told by family and DDS agent, she is supposed to have a second aid, someone with her until bedtime.  Have been looking since August. Also, check to see if they can assume medication administration to ensure she is getting her medications twice daily. Do they do any pill pack delivery? I would be happy to talk with supervisor if you have someone.

## 2020-03-15 NOTE — Progress Notes (Signed)
I have read the note, and I agree with the clinical assessment and plan.  David Towson K Azarian Starace   

## 2020-03-15 NOTE — Progress Notes (Signed)
PATIENT: Alicia Duncan DOB: 03-Jun-1975  REASON FOR VISIT: follow up HISTORY FROM: patient  HISTORY OF PRESENT ILLNESS: Today 03/15/20 Alicia Duncan is a 44 year old female with history of Rett syndrome and severe mental retardation.  Hospitalized in November for seizures felt related to medication noncompliance, her sitter was out.  In hospital, had NG tube, it was removed.  EEG in the hospital suggestive of diffuse moderate encephalopathy no seizures seen.  Previously had hospital admission in September for seizure activity, concern her father was forgetting to administer seizure medications. Her brother, Alicia Duncan has become her legal guardian, he lives in [place], they see him every 1-2 weeks. Her father has early onset dementia, Alicia Duncan's brother who has mental health issues is also in the home.  Has a caregiver, Alicia Duncan who stays with the patient everyday for 10 hours. Is currently taking Ativan 1 mg twice a day, Trileptal 600 mg twice a day, Gabitril 4 mg twice a day. Has been in the ER or Hospitalized 4 times in the last 6 months for AMS or seizures.  No seizures in the last 30 days, other than a brief myoclonus episodes.  Kadesha gets her medications every morning, according to her sitter, questions if her father administers the evening doses?  I checked the drug registry, Ativan has not been filled since September.  She has to be fed, has a fair appetite, drinks ensures everyday.  Is able to walk with assistance if someone holds her arms.  Her sitter, has been with her for 4 years, from the [facility].  They are looking for a backup sitter, even someone to stay until 10 pm.  Apparently social services has been contacted, the case has been closed.  She presents today for evaluation accompanied by her father and her sitter, Alicia Duncan.   HISTORY 09/14/2019 Dr. Anne Hahn: Alicia Duncan is a 44 year old white female with a history of Rett syndrome with severe mental retardation at this point.   The patient has intermittent episodes of brief myoclonus throughout the day, she has more prominent events of seizure type episodes with rolling movements that may take her 5 to 30 minutes to resolve.  She has not had any events since last seen.  The patient is on low-dose Gabitril and she takes Trileptal.  In the past, she has been on Dilantin, clonazepam, and carbamazepine.  The patient is thin, but her family states that she eats fairly well, she has to be fed.  She has no teeth and has to go on a soft mechanical diet.  The patient is nonambulatory essentially, she has not had any falls or injuries since last seen.  She is nonverbal.   REVIEW OF SYSTEMS: Out of a complete 14 system review of symptoms, the patient complains only of the following symptoms, and all other reviewed systems are negative.  Seizures  ALLERGIES: Allergies  Allergen Reactions  . Benadryl [Diphenhydramine]   . Penicillins Other (See Comments)    Has patient had a PCN reaction causing immediate rash, facial/tongue/throat swelling, SOB or lightheadedness with hypotension: Unknown Has patient had a PCN reaction causing severe rash involving mucus membranes or skin necrosis: Unknown Has patient had a PCN reaction that required hospitalization: Unknown Has patient had a PCN reaction occurring within the last 10 years: Unknown If all of the above answers are "NO", then may proceed with Cephalosporin use.     HOME MEDICATIONS: Outpatient Medications Prior to Visit  Medication Sig Dispense Refill  . baclofen (LIORESAL)  10 MG tablet Take 10 mg by mouth 3 (three) times daily.     Marland Kitchen FLUoxetine (PROZAC) 10 MG capsule Take 10 mg by mouth daily.    Marland Kitchen omeprazole (PRILOSEC) 20 MG capsule Take 20 mg by mouth daily.    Marland Kitchen LORazepam (ATIVAN) 1 MG tablet Take 1 tablet (1 mg total) by mouth 2 (two) times daily. 60 tablet 5  . Oxcarbazepine (TRILEPTAL) 300 MG tablet TAKE 2 TABLETS (600 MG TOTAL) BY MOUTH 2 (TWO) TIMES DAILY. 120 tablet 1   . tiaGABine (GABITRIL) 4 MG tablet TAKE 1 TABLET BY MOUTH 2 TIMES DAILY. 60 tablet 0   No facility-administered medications prior to visit.    PAST MEDICAL HISTORY: Past Medical History:  Diagnosis Date  . Dystonia   . Rett syndrome   . Seizures (HCC)     PAST SURGICAL HISTORY: No past surgical history on file.  FAMILY HISTORY: Family History  Problem Relation Age of Onset  . Seizures Mother   . Memory loss Father   . Diverticulosis Father   . Schizophrenia Brother     SOCIAL HISTORY: Social History   Socioeconomic History  . Marital status: Single    Spouse name: Not on file  . Number of children: Not on file  . Years of education: Not on file  . Highest education level: Not on file  Occupational History  . Not on file  Tobacco Use  . Smoking status: Never Smoker  . Smokeless tobacco: Never Used  Substance and Sexual Activity  . Alcohol use: No  . Drug use: No  . Sexual activity: Not on file  Other Topics Concern  . Not on file  Social History Narrative  . Not on file   Social Determinants of Health   Financial Resource Strain: Not on file  Food Insecurity: Not on file  Transportation Needs: Not on file  Physical Activity: Not on file  Stress: Not on file  Social Connections: Not on file  Intimate Partner Violence: Not on file   PHYSICAL EXAM  Vitals:   03/15/20 1042  BP: 118/72  Pulse: 74  Height: 5\' 1"  (1.549 m)   Body mass index is 14.51 kg/m.  Generalized: Thin appearing woman, in a wheelchair Neurological examination  Mentation: Alert, is nonverbal, does not follow commands Cranial nerve II-XII: Pupils were equal round reactive to light.  Facial symmetry is noted. Tongue movement. Motor: Good strength to all extremities Sensory: Sensory testing is intact  Coordination: Cannot perform Gait and station: Was not ambulated, in a wheelchair, bilateral AFOs Reflexes: Deep tendon reflexes are symmetric    DIAGNOSTIC DATA (LABS, IMAGING,  TESTING) - I reviewed patient records, labs, notes, testing and imaging myself where available.  Lab Results  Component Value Date   WBC 9.2 02/14/2020   HGB 12.1 02/14/2020   HCT 39.3 02/14/2020   MCV 89.1 02/14/2020   PLT 97 (L) 02/14/2020      Component Value Date/Time   NA 137 02/16/2020 0519   NA 151 (H) 09/14/2019 1046   K 3.8 02/16/2020 0519   CL 107 02/16/2020 0519   CO2 25 02/16/2020 0519   GLUCOSE 118 (H) 02/16/2020 0519   BUN 10 02/16/2020 0519   BUN 20 09/14/2019 1046   CREATININE 0.44 02/16/2020 0519   CALCIUM 8.1 (L) 02/16/2020 0519   PROT 7.9 02/13/2020 1614   PROT 8.0 09/14/2019 1046   ALBUMIN 4.2 02/13/2020 1614   ALBUMIN 4.8 09/14/2019 1046   AST 39 02/13/2020  1614   ALT 29 02/13/2020 1614   ALKPHOS 56 02/13/2020 1614   BILITOT 0.7 02/13/2020 1614   BILITOT 0.4 09/14/2019 1046   GFRNONAA >60 02/16/2020 0519   GFRAA >60 12/21/2019 0434   No results found for: CHOL, HDL, LDLCALC, LDLDIRECT, TRIG, CHOLHDL No results found for: WUJW1X Lab Results  Component Value Date   VITAMINB12 880 05/13/2016   Lab Results  Component Value Date   TSH 2.300 03/16/2019   ASSESSMENT AND PLAN 44 y.o. year old female  has a past medical history of Dystonia, Rett syndrome, and Seizures (HCC). here with:  1.  Rett syndrome 2.  Seizures  -Continue current medications (Gabitril 4 mg twice a day, Trileptal 600 mg twice a day, Ativan 1 mg twice a day), refill for Ativan sent last filled 12/30/2019 prescription was written in June # 60 tablets (question medication noncompliance?)  -Check routine labs, including Trileptal level  -Will reach out to [facility] agency, request the sitters assist with medications, as father has been resistant to this, question if her medications are being given as scheduled? in the ER or Hospital 4 times last 6 months for seizure/questionable seizures related to medication noncompliance  -Her father has early onset dementia and seems  to be declining, he is confused during the visit, poor historian  -I called her brother, Alicia Duncan who is now her guardian, social service has been  called Clearence Cheek DDS [phone]) they have closed case, feel she is getting the best care possible at home with home health; I called Cathlean Cower, case has been closed, Home health was supposed to be looking for sitter who can stay until Markel goes to bed  -Also think it would be helpful if another sitter was an option to give her current sitter some respite and back up; also possibly extending the hours, current sitter is working 8-6, 7 days a week, reportedly having a hard time getting staff, going on since August, Will reach out to home health agency  -Call for seizure activity, follow-up in 6 months or sooner if needed   I spent 30 minutes of face-to-face and non-face-to-face time with patient.  This included previsit chart review, lab review, study review, order entry, electronic health record documentation, patient education.  Margie Ege, AGNP-C, DNP 03/15/2020, 11:15 AM [facility] [address], [address] [place], [place] [address] [phone]

## 2020-03-15 NOTE — Patient Instructions (Signed)
Continue current medications Check labs today  Will reach out to home health agency  See you back in 6 months

## 2020-03-18 LAB — CBC WITH DIFFERENTIAL/PLATELET
Basophils Absolute: 0 10*3/uL (ref 0.0–0.2)
Basos: 1 %
EOS (ABSOLUTE): 0.1 10*3/uL (ref 0.0–0.4)
Eos: 3 %
Hematocrit: 42 % (ref 34.0–46.6)
Hemoglobin: 13.5 g/dL (ref 11.1–15.9)
Immature Grans (Abs): 0 10*3/uL (ref 0.0–0.1)
Immature Granulocytes: 0 %
Lymphocytes Absolute: 1.6 10*3/uL (ref 0.7–3.1)
Lymphs: 36 %
MCH: 27.1 pg (ref 26.6–33.0)
MCHC: 32.1 g/dL (ref 31.5–35.7)
MCV: 84 fL (ref 79–97)
Monocytes Absolute: 0.3 10*3/uL (ref 0.1–0.9)
Monocytes: 7 %
Neutrophils Absolute: 2.4 10*3/uL (ref 1.4–7.0)
Neutrophils: 53 %
Platelets: 121 10*3/uL — ABNORMAL LOW (ref 150–450)
RBC: 4.98 x10E6/uL (ref 3.77–5.28)
RDW: 16.5 % — ABNORMAL HIGH (ref 11.7–15.4)
WBC: 4.5 10*3/uL (ref 3.4–10.8)

## 2020-03-18 LAB — COMPREHENSIVE METABOLIC PANEL
ALT: 29 IU/L (ref 0–32)
AST: 28 IU/L (ref 0–40)
Albumin/Globulin Ratio: 1.5 (ref 1.2–2.2)
Albumin: 4.7 g/dL (ref 3.8–4.8)
Alkaline Phosphatase: 76 IU/L (ref 44–121)
BUN/Creatinine Ratio: 39 — ABNORMAL HIGH (ref 9–23)
BUN: 22 mg/dL (ref 6–24)
Bilirubin Total: 0.2 mg/dL (ref 0.0–1.2)
CO2: 18 mmol/L — ABNORMAL LOW (ref 20–29)
Calcium: 9.6 mg/dL (ref 8.7–10.2)
Chloride: 107 mmol/L — ABNORMAL HIGH (ref 96–106)
Creatinine, Ser: 0.57 mg/dL (ref 0.57–1.00)
GFR calc Af Amer: 129 mL/min/{1.73_m2} (ref >59)
GFR calc non Af Amer: 112 mL/min/{1.73_m2} (ref >59)
Globulin, Total: 3.2 g/dL (ref 1.5–4.5)
Glucose: 76 mg/dL (ref 65–99)
Potassium: 4.1 mmol/L (ref 3.5–5.2)
Sodium: 145 mmol/L — ABNORMAL HIGH (ref 134–144)
Total Protein: 7.9 g/dL (ref 6.0–8.5)

## 2020-03-18 LAB — 10-HYDROXYCARBAZEPINE: Oxcarbazepine SerPl-Mcnc: 21 ug/mL (ref 10–35)

## 2020-03-19 NOTE — Telephone Encounter (Signed)
I called and LMVM for [employer], intake coordinator  [phone]. I then call home of pt.  Sarah, caregiver in am, gave me amy chapman case manager  [phone].

## 2020-03-19 NOTE — Telephone Encounter (Signed)
Okay for staff to give medications if father will allow, in fact his may be preferable. I think she needs someone there with her until she goes to bed, I called the DDS case worker who indicated that was supposed to be the case. Please emphasis, the importance of findings additional help.

## 2020-03-19 NOTE — Telephone Encounter (Signed)
Spoke to [employer], [phone] relayed concerns for pt.  Needing 2nd caregiver for pt, whom father has ALZ.Dementia.  Huntley Dec stays with pt 730 - 6pm father gives medications in am.  Working on getting 2 nd caregiver, not easy due to all dynamics.  Genelle Bal, brother of pt, is guardian of pt.  Huntley Dec or father given meds in am and and sara give at 6pm.  Work?

## 2020-03-19 NOTE — Telephone Encounter (Signed)
LMVM for Alicia Duncan, to return call about issues that came up on appt with pt.  [phone].

## 2020-03-20 NOTE — Telephone Encounter (Signed)
I called and LMVM for Amy Sibyl Parr with [facility] services relayed that if it would be helpful for sara to remind father ( or can she give meds) in am and again prior to leaving at 1800 (as close to 12 hours as possible.  If she had questions to call me back.

## 2020-04-09 ENCOUNTER — Emergency Department
Admission: EM | Admit: 2020-04-09 | Discharge: 2020-04-09 | Disposition: A | Discharge status: Home / Self Care | Payer: Medicare Other | Attending: Emergency Medicine | Admitting: Emergency Medicine

## 2020-04-09 ENCOUNTER — Emergency Department: Payer: Medicare Other

## 2020-04-09 DIAGNOSIS — F842 Rett's syndrome: Secondary | ICD-10-CM | POA: Diagnosis not present

## 2020-04-09 DIAGNOSIS — R569 Unspecified convulsions: Secondary | ICD-10-CM | POA: Diagnosis not present

## 2020-04-09 DIAGNOSIS — D696 Thrombocytopenia, unspecified: Secondary | ICD-10-CM | POA: Diagnosis not present

## 2020-04-09 DIAGNOSIS — R7309 Other abnormal glucose: Secondary | ICD-10-CM | POA: Diagnosis not present

## 2020-04-09 DIAGNOSIS — R718 Other abnormality of red blood cells: Secondary | ICD-10-CM | POA: Diagnosis not present

## 2020-04-09 DIAGNOSIS — Z79899 Other long term (current) drug therapy: Secondary | ICD-10-CM | POA: Diagnosis not present

## 2020-04-09 LAB — COMPREHENSIVE METABOLIC PANEL
ALT: 32 U/L (ref 0–44)
AST: 35 U/L (ref 15–41)
Albumin: 4.2 g/dL (ref 3.5–5.0)
Alkaline Phosphatase: 53 U/L (ref 38–126)
Anion gap: 11 (ref 5–15)
BUN: 19 mg/dL (ref 6–20)
CO2: 25 mmol/L (ref 22–32)
Calcium: 9.3 mg/dL (ref 8.9–10.3)
Chloride: 103 mmol/L (ref 98–111)
Creatinine, Ser: 0.52 mg/dL (ref 0.44–1.00)
GFR, Estimated: >60 mL/min (ref >60)
Glucose, Bld: 67 mg/dL — ABNORMAL LOW (ref 70–99)
Potassium: 3.8 mmol/L (ref 3.5–5.1)
Sodium: 139 mmol/L (ref 135–145)
Total Bilirubin: 0.5 mg/dL (ref 0.3–1.2)
Total Protein: 7.9 g/dL (ref 6.5–8.1)

## 2020-04-09 LAB — CBC WITH DIFFERENTIAL/PLATELET
Abs Immature Granulocytes: 0.01 10*3/uL (ref 0.00–0.07)
Basophils Absolute: 0 10*3/uL (ref 0.0–0.1)
Basophils Relative: 1 %
Eosinophils Absolute: 0.1 10*3/uL (ref 0.0–0.5)
Eosinophils Relative: 2 %
HCT: 42.2 % (ref 36.0–46.0)
Hemoglobin: 13.3 g/dL (ref 12.0–15.0)
Immature Granulocytes: 0 %
Lymphocytes Relative: 31 %
Lymphs Abs: 1.4 10*3/uL (ref 0.7–4.0)
MCH: 26.8 pg (ref 26.0–34.0)
MCHC: 31.5 g/dL (ref 30.0–36.0)
MCV: 85.1 fL (ref 80.0–100.0)
Monocytes Absolute: 0.4 10*3/uL (ref 0.1–1.0)
Monocytes Relative: 9 %
Neutro Abs: 2.5 10*3/uL (ref 1.7–7.7)
Neutrophils Relative %: 57 %
Platelets: 113 10*3/uL — ABNORMAL LOW (ref 150–400)
RBC: 4.96 MIL/uL (ref 3.87–5.11)
RDW: 18.6 % — ABNORMAL HIGH (ref 11.5–15.5)
WBC: 4.5 10*3/uL (ref 4.0–10.5)
nRBC: 0 % (ref 0.0–0.2)

## 2020-04-09 LAB — CBG MONITORING, ED
Glucose-Capillary: 109 mg/dL — ABNORMAL HIGH (ref 70–99)
Glucose-Capillary: 64 mg/dL — ABNORMAL LOW (ref 70–99)

## 2020-04-09 MED ORDER — TIAGABINE HCL 2 MG PO TABS
4.0000 mg | ORAL_TABLET | ORAL | Status: AC
  Administered 2020-04-09: 4 mg via ORAL
  Filled 2020-04-09: qty 2

## 2020-04-09 MED ORDER — LORAZEPAM 2 MG/ML IJ SOLN: 2.0000 mg | Freq: Once | INTRAMUSCULAR | Status: AC

## 2020-04-09 MED ORDER — OXCARBAZEPINE 300 MG PO TABS: 600.0000 mg | ORAL_TABLET | ORAL | Status: DC

## 2020-04-09 MED ORDER — BACLOFEN 10 MG PO TABS
10.0000 mg | ORAL_TABLET | ORAL | Status: AC
  Administered 2020-04-09: 10 mg via ORAL

## 2020-04-09 MED ORDER — LORAZEPAM 1 MG PO TABS
1.0000 mg | ORAL_TABLET | Freq: Two times a day (BID) | ORAL | Status: DC
  Administered 2020-04-09: 1 mg via ORAL
  Filled 2020-04-09: qty 1

## 2020-04-09 MED ORDER — DEXTROSE IN LACTATED RINGERS 5 % IV SOLN: INTRAVENOUS | Status: AC

## 2020-04-09 MED ORDER — LACTATED RINGERS IV BOLUS
1000.0000 mL | Freq: Once | INTRAVENOUS | Status: AC
  Administered 2020-04-09: 1000 mL via INTRAVENOUS

## 2020-04-09 MED ORDER — SODIUM CHLORIDE 0.9% FLUSH: 3.0000 mL | INTRAVENOUS | Status: DC | PRN

## 2020-04-09 MED ORDER — SODIUM CHLORIDE 0.9% FLUSH: 3.0000 mL | Freq: Two times a day (BID) | INTRAVENOUS | Status: DC

## 2020-04-09 MED ORDER — TIAGABINE HCL 2 MG PO TABS
4.0000 mg | ORAL_TABLET | Freq: Two times a day (BID) | ORAL | Status: DC
  Filled 2020-04-09 (×2): qty 2

## 2020-04-09 MED ORDER — SODIUM CHLORIDE 0.9 % IV SOLN
250.0000 mL | INTRAVENOUS | Status: DC | PRN
  Administered 2020-04-09: 250 mL via INTRAVENOUS

## 2020-04-09 MED ORDER — BACLOFEN 10 MG PO TABS
10.0000 mg | ORAL_TABLET | Freq: Three times a day (TID) | ORAL | Status: DC
  Filled 2020-04-09 (×2): qty 1

## 2020-04-09 MED ORDER — LORAZEPAM 2 MG/ML IJ SOLN
INTRAMUSCULAR | Status: AC
  Administered 2020-04-09: 2 mg via INTRAVENOUS
  Filled 2020-04-09: qty 1

## 2020-04-09 MED ORDER — OXCARBAZEPINE 300 MG PO TABS: 600.0000 mg | ORAL_TABLET | Freq: Two times a day (BID) | ORAL | Status: DC

## 2020-04-09 NOTE — ED Triage Notes (Signed)
Per ems 4 second seizure  , pt appeared to be having a seizure , twitching eyes

## 2020-04-09 NOTE — ED Provider Notes (Signed)
[facility] Emergency Department Provider Note   ____________________________________________   Event Date/Time   First MD Initiated Contact with Patient 04/09/20 1346     (approximate)  I have reviewed the triage vital signs and the nursing notes.   HISTORY  Chief Complaint Seizures (Per ems 4 second seizure  , pt appeared to be having a seizure , twitching eyes )    HPI Alicia Duncan is a 45 y.o. female with past medical history of Rett syndrome, seizures, and cognitive disability who presents to the ED for seizures.  History is limited as patient nonverbal at baseline and no family present.  Per EMS, patient noted by family to have seizure earlier today.  They were unable to state to EMS what the seizure looks like or how long it lasted.  It is also unclear whether the seizure today is different from her usual seizure episodes.  EMS was concerned that patient had another seizure during transport as they noted her turning towards her right side with shaking on that side.  Per EMS, this episode lasted for only a few seconds, but on arrival to the ED, patient looking towards the right with diffuse shaking.        Past Medical History:  Diagnosis Date  . Dystonia   . Rett syndrome   . Seizures [facility])     Patient Active Problem List   Diagnosis Date Noted  . Goals of care, counseling/discussion   . Palliative care by specialist   . DNR (do not resuscitate)   . Seizures ([place]) 02/13/2020  . Protein-calorie malnutrition, severe 12/20/2019  . Breakthrough seizure ([place]) 12/18/2019  . Cough 05/13/2016  . Pancytopenia ([place]) 05/13/2016  . Iron deficiency anemia 05/13/2016  . SIRS (systemic inflammatory response syndrome) ([place]) 05/11/2016  . Dental infection 05/11/2016  . 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 Seizure disorder ([place]) 05/11/2016  . Sepsis ([place]) 05/11/2016  . Seizure ([place]) 04/25/2013    History reviewed. No pertinent surgical history.  Prior to  Admission medications   Medication Sig Start Date End Date Taking? Authorizing Provider  baclofen (LIORESAL) 10 MG tablet Take 10 mg by mouth 3 (three) times daily.    Yes [provider]  FLUoxetine (PROZAC) 10 MG capsule Take 10 mg by mouth daily. 10/28/19  Yes [provider]  LORazepam (ATIVAN) 1 MG tablet Take 1 tablet (1 mg total) by mouth 2 (two) times daily. 03/15/20  Yes Suzzanne Cloud, NP  omeprazole (PRILOSEC) 20 MG capsule Take 20 mg by mouth daily.   Yes [provider]  Oxcarbazepine (TRILEPTAL) 300 MG tablet Take 2 tablets (600 mg total) by mouth 2 (two) times daily. 03/15/20  Yes Suzzanne Cloud, NP  tiaGABine (GABITRIL) 4 MG tablet Take 1 tablet (4 mg total) by mouth 2 (two) times daily. 03/15/20  Yes Suzzanne Cloud, NP    Allergies Benadryl [diphenhydramine] and Penicillins  Family History  Problem Relation Age of Onset  . Seizures Mother   . Memory loss Father   . Diverticulosis Father   . Schizophrenia Brother     Social History Social History   Tobacco Use  . Smoking status: Never Smoker  . Smokeless tobacco: Never Used  Substance Use Topics  . Alcohol use: No  . Drug use: No    Review of Systems Unable to obtain due to patient nonverbal.  ____________________________________________   PHYSICAL EXAM:  VITAL SIGNS: ED Triage Vitals  Enc Vitals Group  BP      Pulse      Resp      Temp      Temp src      SpO2      Weight      Height      Head Circumference      Peak Flow      Pain Score      Pain Loc      Pain Edu?      Excl. in GC?     Constitutional: Awake and alert, nonverbal. Eyes: Conjunctivae are normal.  Gaze deviation to the right, does not track with eyes.  Pupils equal and reactive to light bilaterally. Head: Atraumatic. Nose: No congestion/rhinnorhea. Mouth/Throat: Mucous membranes are moist. Neck: Normal ROM Cardiovascular: Normal rate, regular rhythm. Grossly normal heart sounds. Respiratory:  Normal respiratory effort.  No retractions. Lungs CTAB. Gastrointestinal: Soft and nontender. No distention. Genitourinary: deferred Musculoskeletal: No lower extremity tenderness nor edema. Neurologic: Spastic movements and right upper and lower extremities, withdraws from pain on the left but not on the right. Skin:  Skin is warm, dry and intact. No rash noted. Psychiatric: Mood and affect are normal. Speech and behavior are normal.  ____________________________________________   LABS (all labs ordered are listed, but only abnormal results are displayed)  Labs Reviewed  CBC WITH DIFFERENTIAL/PLATELET - Abnormal; Notable for the following components:      Result Value   RDW 18.6 (*)    Platelets 113 (*)    All other components within normal limits  COMPREHENSIVE METABOLIC PANEL - Abnormal; Notable for the following components:   Glucose, Bld 67 (*)    All other components within normal limits  MISC [facility] TEST (SEND OUT)  MISC [facility] TEST (SEND OUT)  MISC [facility] TEST (SEND OUT)  CBG MONITORING, ED  CBG MONITORING, ED  CBG MONITORING, ED   ____________________________________________  EKG  ED ECG REPORT I, Chesley Noon, the attending physician, personally viewed and interpreted this ECG.   Date: 04/09/2020  EKG Time: 14:18  Rate: 98  Rhythm: normal sinus rhythm  Axis: Normal  Intervals:none  ST&T Change: None   PROCEDURES  Procedure(s) performed (including Critical Care):  Procedures   ____________________________________________   INITIAL IMPRESSION / ASSESSMENT AND PLAN / ED COURSE       45 year old female with history of Rett syndrome and seizures, nonverbal at baseline, presents to the ED from home with concern for seizure.  On arrival, concerned that patient continues to seize with right gaze deviation and spastic movements noted on the right.  She does have a history of frequent myoclonus, making it difficult to discern what is a seizure for her.   She was given 2 mg of IV Ativan and showed significant improvement in her condition.  Neurology was called to evaluate the patient, by the time they arrived, patient appeared to be back to her baseline.  She is now tracking with her eyes with spastic movements resolved.  Labs are unremarkable, CT head reviewed by me and shows no obvious hemorrhage, negative for acute process per radiology.  EKG shows no evidence of arrhythmia or ischemia.  Radiology spoke with patient's caregiver, who obtained paperwork earlier today stating she could give seizure medications rather than the patient's father as this has been causing issues with medication noncompliance in the past.  We will check levels of her medications and give her her usual home dose here in the ED, after which neurology states she would be appropriate  for discharge home as long as she remains seizure-free.  Patient turned over to oncoming provider pending observation for any further seizure activity, we will also send off labs for seizure medication levels.      ____________________________________________   FINAL CLINICAL IMPRESSION(S) / ED DIAGNOSES  Final diagnoses:  Seizure (HCC)  Rett syndrome     ED Discharge Orders    None       Note:  This document was prepared using Dragon voice recognition software and may include unintentional dictation errors.   Chesley Noon, MD 04/09/20 [phone]

## 2020-04-09 NOTE — ED Notes (Signed)
[facility]  CALLED  FOR  TRANSPORT  HOME 

## 2020-04-09 NOTE — ED Notes (Signed)
pts brother poa notified and there are people pts father in home , all questions answered

## 2020-04-09 NOTE — Consult Note (Signed)
Neurology Consultation Reason for Consult: Seizure  Requesting Physician: Dr. Larinda Buttery  CC: Seizure  History is obtained from: Chart review,  [phone] Alicia Duncan -- Caregiver Brother, guardian, as listed in the chart  HPI: Alicia Duncan is a 45 y.o. 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  Per caregiver, she jerking more than usual then started "seizing fully" when Alicia Duncan went to change her. Went on 6 minutes before EMS arrived. She reports patient's father (who has dementia and prior to this had been the primary caregiver for his beloved baby daughter) gets belligerent if she is in the room when he is handling the patient's medication so the caregiver cannot be sure what's being given.   Per the patient's brother/guardian, breakthrough seizures have been happening more frequently due to father not consistently giving her meds. Alicia Duncan, another brother, lives in the home too, but is schizophrenic.   To be allowed to administer medication, Alicia Duncan needed a form completed by the patient's guardian which was just completed earlier today.  Per the patient's guardian, the family feels it is in everyone's best interest to keep [name] home -- both for her sake as well as for her father's.  Of note, the patient's guardian lives separately in [place] and is not necessarily up-to-date on a day-to-day basis of the events of the home.  WUJ:[id number] to obtain due to altered mental status.  However Alicia Duncan reports the patient appeared to have been bloated as she was starting more than usual and not wanting to eat solid food until yesterday.  She did have a large bowel movement yesterday and since then the slight agitation and gas she had was improved.  She has not been having emesis, fevers, sweats or other signs or symptoms of infection.  She has been eating normally and Alicia Duncan feels her weight has been stable  Past Medical History:   Diagnosis Date  . Dystonia   . Rett syndrome   . Seizures (HCC)     Current Facility-Administered Medications:  .  LORazepam (ATIVAN) tablet 1 mg, 1 mg, Oral, BID, Yeison Sippel L, MD .  Oxcarbazepine (TRILEPTAL) tablet 600 mg, 600 mg, Oral, BID, Amiylah Anastos L, MD .  tiaGABine (GABITRIL) tablet 4 mg, 4 mg, Oral, BID, Naliah Eddington L, MD  Current Outpatient Medications:  .  baclofen (LIORESAL) 10 MG tablet, Take 10 mg by mouth 3 (three) times daily. , Disp: , Rfl:  .  FLUoxetine (PROZAC) 10 MG capsule, Take 10 mg by mouth daily., Disp: , Rfl:  .  LORazepam (ATIVAN) 1 MG tablet, Take 1 tablet (1 mg total) by mouth 2 (two) times daily., Disp: 60 tablet, Rfl: 3 .  omeprazole (PRILOSEC) 20 MG capsule, Take 20 mg by mouth daily., Disp: , Rfl:  .  Oxcarbazepine (TRILEPTAL) 300 MG tablet, Take 2 tablets (600 mg total) by mouth 2 (two) times daily., Disp: 120 tablet, Rfl: 5 .  tiaGABine (GABITRIL) 4 MG tablet, Take 1 tablet (4 mg total) by mouth 2 (two) times daily., Disp: 60 tablet, Rfl: 5 - Also takes an iron supplement per Alicia Duncan  Family History  Problem Relation Age of Onset  . Seizures Mother   . Memory loss Father   . Diverticulosis Father   . Schizophrenia Brother    Social History:  reports that she has never smoked. She has never used smokeless tobacco. She reports that she does not drink alcohol and does not use drugs.  Exam:  Current vital signs: BP 133/81 (BP Location: Right Arm)   Pulse 85   Temp 98.8 F (37.1 C) (Axillary)   Resp 20   SpO2 98%  Vital signs in last 24 hours: Temp:  [98.8 F (37.1 C)] 98.8 F (37.1 C) (01/03 1353) Pulse Rate:  [85] 85 (01/03 1353) Resp:  [20] 20 (01/03 1353) BP: (133)/(81) 133/81 (01/03 1353) SpO2:  [98 %] 98 % (01/03 1353)   Physical Exam  Constitutional: Thin, chronically ill Psych: Calm Eyes: No scleral injection HENT: No OP obstruction MSK: Contractures throughout Cardiovascular: Normal rate and regular rhythm.   Respiratory: Effort normal, non-labored breathing GI: Soft.  No distension. There is no tenderness.  Skin: Warm dry and intact visible skin  Neuro --after Ativan administered by ED provider Mental Status: Patient is awake, alert,  Cranial Nerves: II:  Pupils are equal, round, and reactive to light.   III,IV, VI: Makes quick saccadic movements in all directions of gaze V: Facial sensation is grossly symmetric VII: Facial movement is symmetric, with frequent sucking type movements, tongue protrusions, the arms VIII: hearing is intact to voice, orients to sound XII: tongue is midline without atrophy or fasciculations when she spontaneously protrudes it Motor: Tone is severely spastic throughout. Bulk is cachectic throughout.  She had some slight rhythmic movement of her left thumb, but this stopped with gentle touch Sensory: Equally reactive to tickle in all 4 extremities   I have reviewed labs in epic and the results pertinent to this consultation are: Normal CBC, normal CMP other than mildly low glucose at 67 mildly increased RDW and mildly low platelets (113, within her baseline range)  I have reviewed the images obtained: No new imaging indicated HCT 10/13/19, 12/17/19 and 02/13/20 were all stable with similar presentations; 02/13/20 HCT personally reviewed  Impression: This is a 45 year old woman with past medical history significant for Rett syndrome and epilepsy, with a complicated home social situation.  Guardian recently(just earlier today) completed paperwork that allows the caregiver to be responsible for administering medication, which should assist with medication compliance going forward.  Caregiver now feels confident that patient will be able to get medications as prescribed.  Given unremarkable work-up to date and patient quickly back to baseline with a dose of Ativan, as well as reassuring review of systems as provided by caregiver, do not feel that any further inpatient  neurological work-up is needed.  Recommendations: -Levels sent of Gabitril, Trileptal and Baclofen to gauge compliance -Continue home medications without any dose changes  Gabitril 4 mg twice a day,   Trileptal 600 mg twice a day,   Ativan 1 mg twice a day -Continue Baclofen 10 mg TID (missed doses can cause withdrawal seizures)  -Close PCP follow-up -Full medical clearance per ED; caregiver feels comfortable with patient returning home  Brooke Dare MD-PhD [facility] [phone]

## 2020-04-12 ENCOUNTER — Telehealth: Payer: Self-pay | Admitting: *Deleted

## 2020-04-12 NOTE — Telephone Encounter (Signed)
Received from [employer] re: pt. Needing singed and dated list of medications precribed for pt by [place].  Medlist printed amended to show what meds we prescribe and how to give.  To be  Signed then to fax back.  [phone] Noreene Larsson foster RN agency nurse.  [phone], fax [phone].

## 2020-07-16 ENCOUNTER — Emergency Department
Admission: EM | Admit: 2020-07-16 | Discharge: 2020-07-16 | Disposition: A | Discharge status: Home / Self Care | Payer: Medicare Other | Attending: Emergency Medicine | Admitting: Emergency Medicine

## 2020-07-16 ENCOUNTER — Emergency Department: Payer: Medicare Other

## 2020-07-16 ENCOUNTER — Other Ambulatory Visit: Payer: Self-pay

## 2020-07-16 DIAGNOSIS — G40909 Epilepsy, unspecified, not intractable, without status epilepticus: Secondary | ICD-10-CM | POA: Insufficient documentation

## 2020-07-16 DIAGNOSIS — R569 Unspecified convulsions: Secondary | ICD-10-CM | POA: Diagnosis present

## 2020-07-16 LAB — URINALYSIS, COMPLETE (UACMP) WITH MICROSCOPIC
Bilirubin Urine: NEGATIVE
Glucose, UA: NEGATIVE mg/dL
Hgb urine dipstick: NEGATIVE
Ketones, ur: 5 mg/dL — AB
Leukocytes,Ua: NEGATIVE
Nitrite: NEGATIVE
Protein, ur: NEGATIVE mg/dL
Specific Gravity, Urine: 1.023 (ref 1.005–1.030)
WBC, UA: NONE SEEN WBC/hpf (ref 0–5)
pH: 8 (ref 5.0–8.0)

## 2020-07-16 LAB — CBC WITH DIFFERENTIAL/PLATELET
Abs Immature Granulocytes: 0.01 10*3/uL (ref 0.00–0.07)
Basophils Absolute: 0 10*3/uL (ref 0.0–0.1)
Basophils Relative: 0 %
Eosinophils Absolute: 0 10*3/uL (ref 0.0–0.5)
Eosinophils Relative: 1 %
HCT: 41.6 % (ref 36.0–46.0)
Hemoglobin: 13.8 g/dL (ref 12.0–15.0)
Immature Granulocytes: 0 %
Lymphocytes Relative: 23 %
Lymphs Abs: 1.7 10*3/uL (ref 0.7–4.0)
MCH: 29.5 pg (ref 26.0–34.0)
MCHC: 33.2 g/dL (ref 30.0–36.0)
MCV: 88.9 fL (ref 80.0–100.0)
Monocytes Absolute: 0.6 10*3/uL (ref 0.1–1.0)
Monocytes Relative: 8 %
Neutro Abs: 5 10*3/uL (ref 1.7–7.7)
Neutrophils Relative %: 68 %
Platelets: 113 10*3/uL — ABNORMAL LOW (ref 150–400)
RBC: 4.68 MIL/uL (ref 3.87–5.11)
RDW: 18.2 % — ABNORMAL HIGH (ref 11.5–15.5)
WBC: 7.3 10*3/uL (ref 4.0–10.5)
nRBC: 0 % (ref 0.0–0.2)

## 2020-07-16 LAB — COMPREHENSIVE METABOLIC PANEL
ALT: 75 U/L — ABNORMAL HIGH (ref 0–44)
AST: 43 U/L — ABNORMAL HIGH (ref 15–41)
Albumin: 3.8 g/dL (ref 3.5–5.0)
Alkaline Phosphatase: 59 U/L (ref 38–126)
Anion gap: 9 (ref 5–15)
BUN: 15 mg/dL (ref 6–20)
CO2: 22 mmol/L (ref 22–32)
Calcium: 8.8 mg/dL — ABNORMAL LOW (ref 8.9–10.3)
Chloride: 108 mmol/L (ref 98–111)
Creatinine, Ser: 0.48 mg/dL (ref 0.44–1.00)
GFR, Estimated: >60 mL/min (ref >60)
Glucose, Bld: 110 mg/dL — ABNORMAL HIGH (ref 70–99)
Potassium: 3.6 mmol/L (ref 3.5–5.1)
Sodium: 139 mmol/L (ref 135–145)
Total Bilirubin: 0.4 mg/dL (ref 0.3–1.2)
Total Protein: 7.1 g/dL (ref 6.5–8.1)

## 2020-07-16 NOTE — ED Notes (Signed)
Called EMS for transport home 

## 2020-07-16 NOTE — ED Provider Notes (Signed)
5  [facility] Emergency Department Provider Note ____________________________________________   Event Date/Time   First MD Initiated Contact with Patient 07/16/20 1455     (approximate)  I have reviewed the triage vital signs and the nursing notes.  HISTORY  Chief Complaint Seizures   HPI Alicia Duncan is a 45 y.o. femalewho presents to the ED for evaluation of seizure.   Chart review indicates history of Rett syndrome, severe cognitive disability, seizure disorder on Trileptal and Gabitril.  Last saw neurology as an outpatient 4 months ago and medications were maintained without change.  History is limited due to patient being nonverbal at baseline.  Patient visited the ED via EMS due to a seizure at home and concern for respiratory congestion.  EMS reports a generalized tonic-clonic seizure at home that self resolved and she is currently at her behavioral baseline.  His father, who I speak with over the phone.  He reports that patient has been doing well and denies any recent illnesses.  Reports of brief seizure that he did not witness while he was out of the house during brief errands, witnessed by patient's caregiver.  No reported need for abortive medications.   Past Medical History:  Diagnosis Date  . Dystonia   . Rett syndrome   . Seizures [facility])     Patient Active Problem List   Diagnosis Date Noted  . Goals of care, counseling/discussion   . Palliative care by specialist   . DNR (do not resuscitate)   . Seizures (HCC) 02/13/2020  . Protein-calorie malnutrition, severe 12/20/2019  . Breakthrough seizure (HCC) 12/18/2019  . Cough 05/13/2016  . Pancytopenia (HCC) 05/13/2016  . Iron deficiency anemia 05/13/2016  . SIRS (systemic inflammatory response syndrome) (HCC) 05/11/2016  . Dental infection 05/11/2016  . 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 Seizure disorder (HCC) 05/11/2016  . Sepsis (HCC) 05/11/2016  . Seizure (HCC) 04/25/2013    No  past surgical history on file.  Prior to Admission medications   Medication Sig Start Date End Date Taking? Authorizing Provider  baclofen (LIORESAL) 10 MG tablet Take 10 mg by mouth 3 (three) times daily.     [provider]  FLUoxetine (PROZAC) 10 MG capsule Take 10 mg by mouth daily. 10/28/19   [provider]  LORazepam (ATIVAN) 1 MG tablet Take 1 tablet (1 mg total) by mouth 2 (two) times daily. 03/15/20   Glean Salvo, NP  omeprazole (PRILOSEC) 20 MG capsule Take 20 mg by mouth daily.    [provider]  Oxcarbazepine (TRILEPTAL) 300 MG tablet Take 2 tablets (600 mg total) by mouth 2 (two) times daily. 03/15/20   Glean Salvo, NP  tiaGABine (GABITRIL) 4 MG tablet Take 1 tablet (4 mg total) by mouth 2 (two) times daily. 03/15/20   Glean Salvo, NP    Allergies Benadryl [diphenhydramine] and Penicillins  Family History  Problem Relation Age of Onset  . Seizures Mother   . Memory loss Father   . Diverticulosis Father   . Schizophrenia Brother     Social History Social History   Tobacco Use  . Smoking status: Never Smoker  . Smokeless tobacco: Never Used  Substance Use Topics  . Alcohol use: No  . Drug use: No    Review of Systems  Unable to be assessed due to nonverbal patient _____________________________________   PHYSICAL EXAM:  VITAL SIGNS: Vitals:   07/16/20 1753 07/16/20 1953  BP: 121/81 129/79  Pulse: 80 68  Resp: 17 16  Temp:    SpO2: 95% 96%     Constitutional: Alert.  Cachectic.  Nonverbal. Eyes: Conjunctivae are normal. PERRL. EOMI. Head: Atraumatic. Nose: No congestion/rhinnorhea. Mouth/Throat: Mucous membranes are moist.  Oropharynx non-erythematous. Frequent tongue smacking is noted, the reportedly at baseline patient Neck: No stridor. No cervical spine tenderness to palpation. Cardiovascular: Normal rate, regular rhythm. Grossly normal heart sounds.  Good peripheral circulation. Respiratory: Normal respiratory  effort.  No retractions. Lungs CTAB. Gastrointestinal: Soft , nondistended, seems to grimace with suprapubic palpation, but no guarding.  Otherwise benign without peritoneal features or apparent tenderness throughout.Marland Kitchen No CVA tenderness. Musculoskeletal: No lower extremity tenderness nor edema.  No joint effusions. No signs of acute trauma. Neurologic: . No gross focal neurologic deficits are appreciated.  Moving all 4, but not following commands. Skin:  Skin is warm, dry and intact. No rash noted. Psychiatric: Mood and affect are unable to be assessed  ____________________________________________   LABS (all labs ordered are listed, but only abnormal results are displayed)  Labs Reviewed  URINALYSIS, COMPLETE (UACMP) WITH MICROSCOPIC - Abnormal; Notable for the following components:      Result Value   Color, Urine YELLOW (*)    APPearance CLOUDY (*)    Ketones, ur 5 (*)    Bacteria, UA RARE (*)    All other components within normal limits  COMPREHENSIVE METABOLIC PANEL - Abnormal; Notable for the following components:   Glucose, Bld 110 (*)    Calcium 8.8 (*)    AST 43 (*)    ALT 75 (*)    All other components within normal limits  CBC WITH DIFFERENTIAL/PLATELET - Abnormal; Notable for the following components:   RDW 18.2 (*)    Platelets 113 (*)    All other components within normal limits   ____________________________________________  12 Lead EKG   ____________________________________________  RADIOLOGY  ED MD interpretation: 1 view CXR reviewed by me without evidence of acute cardiopulmonary pathology.  Official radiology report(s): CT Head Wo Contrast  Result Date: 07/16/2020 CLINICAL DATA:  Altered mental status EXAM: CT HEAD WITHOUT CONTRAST TECHNIQUE: Contiguous axial images were obtained from the base of the skull through the vertex without intravenous contrast. COMPARISON:  04/09/2020 FINDINGS: Brain: age advanced volume loss. No acute intracranial  abnormality. Specifically, no hemorrhage, hydrocephalus, mass lesion, acute infarction, or significant intracranial injury. Vascular: No hyperdense vessel or unexpected calcification. Skull: No acute calvarial abnormality. Sinuses/Orbits: Opacified right maxillary sinus, stable. No acute findings. Other: None IMPRESSION: Mild age advanced cerebral volume loss. No acute intracranial abnormality. Electronically Signed   By: Charlett Nose M.D.   On: 07/16/2020 16:29   DG Chest Portable 1 View  Result Date: 07/16/2020 CLINICAL DATA:  Congestion.  Seizure. EXAM: PORTABLE CHEST 1 VIEW COMPARISON:  Chest x-ray dated February 14, 2020. FINDINGS: The heart size and mediastinal contours are within normal limits. Normal pulmonary vascularity. No focal consolidation, pleural effusion, or pneumothorax. No acute osseous abnormality. Old healed right clavicle fracture. Prior thoracolumbar fusion for scoliosis. IMPRESSION: 1. No active disease. Electronically Signed   By: Obie Dredge M.D.   On: 07/16/2020 15:53    ____________________________________________   PROCEDURES and INTERVENTIONS  Procedure(s) performed (including Critical Care):  .1-3 Lead EKG Interpretation Performed by: Delton Prairie, MD Authorized by: Delton Prairie, MD     Interpretation: normal     ECG rate:  80   ECG rate assessment: normal     Rhythm: sinus rhythm     Ectopy: none  Conduction: normal      Medications - No data to display  ____________________________________________   MDM / ED COURSE   Nonverbal 44 year old female with known seizure disorder presents to the ED after an isolated self resolving seizure, without evidence of acute medical organic pathology precipitating the seizure, and amenable to outpatient management with neurology follow-up.  Normal vitals on room air.  Exam is difficult due to her inability to participate, but I see no evidence of acute derangements.  Family reports she is at her behavioral  baseline.  Discussed the case with her father, who has no specific concerns.  Patient still has medications at home, and no recent changes to her regimen.  CT head without evidence of ICH or central pathology causing her breakthrough seizure.  No evidence of UTI, CAP or other metabolic derangements to precipitate the seizure.  She remains asymptomatic here in the ED without seizure-like activity while being observed for a few hours.  Urged parents to return the patient back to her neurologist to discuss increasing her antiepileptic regimen.  We discussed return precautions to the ED prior to discharge.   Clinical Course as of 07/17/20 0041  Mon Jul 16, 2020  1542 Called father, Nehemiah Montee, he was doing some errands. When he returned, he learned a seizure had happened while he was out. A caregiver was there. "it was not short, it was not very long, it was somewhere in between." No recent illnesses. No changes to or missed meds.  [DS]    Clinical Course User Index [DS] Delton Prairie, MD    ____________________________________________   FINAL CLINICAL IMPRESSION(S) / ED DIAGNOSES  Final diagnoses:  Seizure (HCC)  Seizure disorder [facility])     ED Discharge Orders    None       Carissa Musick   Note:  This document was prepared using Dragon voice recognition software and may include unintentional dictation errors.   Delton Prairie, MD 07/17/20 [phone]

## 2020-07-16 NOTE — Discharge Instructions (Signed)
Thankfully, no evidence of illnesses such as pneumonia, UTI to cause Alicia Duncan to have a breakthrough seizure.  Please continue to provide her her typical medications and antiseizure medications.  Follow-up with your PCP and neurologist.  Return to the ED with any further seizures

## 2020-07-16 NOTE — ED Triage Notes (Addendum)
Pt presents to the [facility] via EMS from home with c/o possible seizure. EMS states that they were notified by pt's family after she had episode of shaking to bilateral lower extremities.Pt does have history of seizures. Pt also has hx of Rett syndrome and currently at baseline.

## 2020-09-03 ENCOUNTER — Inpatient Hospital Stay
Admission: EM | Admit: 2020-09-03 | Discharge: 2020-11-01 | DRG: 917 | Disposition: A | Discharge status: Skilled Nursing Facility | Payer: Medicare Other | Attending: Internal Medicine | Admitting: Internal Medicine

## 2020-09-03 DIAGNOSIS — F32A Depression, unspecified: Secondary | ICD-10-CM

## 2020-09-03 DIAGNOSIS — R112 Nausea with vomiting, unspecified: Secondary | ICD-10-CM

## 2020-09-03 DIAGNOSIS — T428X1A Poisoning by antiparkinsonism drugs and other central muscle-tone depressants, accidental (unintentional), initial encounter: Principal | ICD-10-CM | POA: Diagnosis present

## 2020-09-03 DIAGNOSIS — Z0189 Encounter for other specified special examinations: Secondary | ICD-10-CM

## 2020-09-03 DIAGNOSIS — K560 Paralytic ileus: Secondary | ICD-10-CM | POA: Diagnosis not present

## 2020-09-03 DIAGNOSIS — E860 Dehydration: Secondary | ICD-10-CM | POA: Diagnosis not present

## 2020-09-03 DIAGNOSIS — R509 Fever, unspecified: Secondary | ICD-10-CM

## 2020-09-03 DIAGNOSIS — Z681 Body mass index (BMI) 19 or less, adult: Secondary | ICD-10-CM

## 2020-09-03 DIAGNOSIS — K5641 Fecal impaction: Secondary | ICD-10-CM

## 2020-09-03 DIAGNOSIS — E611 Iron deficiency: Secondary | ICD-10-CM | POA: Diagnosis not present

## 2020-09-03 DIAGNOSIS — E870 Hyperosmolality and hypernatremia: Secondary | ICD-10-CM | POA: Diagnosis not present

## 2020-09-03 DIAGNOSIS — B3731 Acute candidiasis of vulva and vagina: Secondary | ICD-10-CM

## 2020-09-03 DIAGNOSIS — Z818 Family history of other mental and behavioral disorders: Secondary | ICD-10-CM

## 2020-09-03 DIAGNOSIS — R1311 Dysphagia, oral phase: Secondary | ICD-10-CM | POA: Diagnosis present

## 2020-09-03 DIAGNOSIS — E872 Acidosis: Secondary | ICD-10-CM | POA: Diagnosis not present

## 2020-09-03 DIAGNOSIS — F79 Unspecified intellectual disabilities: Secondary | ICD-10-CM | POA: Diagnosis present

## 2020-09-03 DIAGNOSIS — R627 Adult failure to thrive: Secondary | ICD-10-CM | POA: Diagnosis not present

## 2020-09-03 DIAGNOSIS — Z20822 Contact with and (suspected) exposure to covid-19: Secondary | ICD-10-CM | POA: Diagnosis present

## 2020-09-03 DIAGNOSIS — F842 Rett's syndrome: Secondary | ICD-10-CM | POA: Diagnosis present

## 2020-09-03 DIAGNOSIS — F419 Anxiety disorder, unspecified: Secondary | ICD-10-CM | POA: Diagnosis present

## 2020-09-03 DIAGNOSIS — Z820 Family history of epilepsy and other diseases of the nervous system: Secondary | ICD-10-CM

## 2020-09-03 DIAGNOSIS — J690 Pneumonitis due to inhalation of food and vomit: Secondary | ICD-10-CM | POA: Diagnosis not present

## 2020-09-03 DIAGNOSIS — R140 Abdominal distension (gaseous): Secondary | ICD-10-CM

## 2020-09-03 DIAGNOSIS — Z8379 Family history of other diseases of the digestive system: Secondary | ICD-10-CM

## 2020-09-03 DIAGNOSIS — Z888 Allergy status to other drugs, medicaments and biological substances status: Secondary | ICD-10-CM

## 2020-09-03 DIAGNOSIS — G40909 Epilepsy, unspecified, not intractable, without status epilepticus: Secondary | ICD-10-CM

## 2020-09-03 DIAGNOSIS — R52 Pain, unspecified: Secondary | ICD-10-CM

## 2020-09-03 DIAGNOSIS — R4701 Aphasia: Secondary | ICD-10-CM

## 2020-09-03 DIAGNOSIS — B373 Candidiasis of vulva and vagina: Secondary | ICD-10-CM | POA: Diagnosis present

## 2020-09-03 DIAGNOSIS — Z880 Allergy status to penicillin: Secondary | ICD-10-CM

## 2020-09-03 DIAGNOSIS — Z741 Need for assistance with personal care: Secondary | ICD-10-CM

## 2020-09-03 DIAGNOSIS — R6250 Unspecified lack of expected normal physiological development in childhood: Secondary | ICD-10-CM | POA: Diagnosis present

## 2020-09-03 DIAGNOSIS — K5900 Constipation, unspecified: Secondary | ICD-10-CM

## 2020-09-03 DIAGNOSIS — E43 Unspecified severe protein-calorie malnutrition: Secondary | ICD-10-CM

## 2020-09-03 DIAGNOSIS — D696 Thrombocytopenia, unspecified: Secondary | ICD-10-CM | POA: Diagnosis present

## 2020-09-03 DIAGNOSIS — Z4659 Encounter for fitting and adjustment of other gastrointestinal appliance and device: Secondary | ICD-10-CM

## 2020-09-03 DIAGNOSIS — A419 Sepsis, unspecified organism: Secondary | ICD-10-CM | POA: Diagnosis not present

## 2020-09-03 DIAGNOSIS — Z66 Do not resuscitate: Secondary | ICD-10-CM | POA: Diagnosis present

## 2020-09-03 DIAGNOSIS — E559 Vitamin D deficiency, unspecified: Secondary | ICD-10-CM | POA: Diagnosis present

## 2020-09-03 DIAGNOSIS — Z79899 Other long term (current) drug therapy: Secondary | ICD-10-CM

## 2020-09-03 DIAGNOSIS — K567 Ileus, unspecified: Secondary | ICD-10-CM

## 2020-09-03 LAB — RESP PANEL BY RT-PCR (FLU A&B, COVID) ARPGX2
Influenza A by PCR: NEGATIVE
Influenza B by PCR: NEGATIVE
SARS Coronavirus 2 by RT PCR: NEGATIVE

## 2020-09-03 LAB — CBC WITH DIFFERENTIAL/PLATELET
Abs Immature Granulocytes: 0.04 10*3/uL (ref 0.00–0.07)
Basophils Absolute: 0 10*3/uL (ref 0.0–0.1)
Basophils Relative: 0 %
Eosinophils Absolute: 0 10*3/uL (ref 0.0–0.5)
Eosinophils Relative: 0 %
HCT: 42.4 % (ref 36.0–46.0)
Hemoglobin: 13.7 g/dL (ref 12.0–15.0)
Immature Granulocytes: 0 %
Lymphocytes Relative: 6 %
Lymphs Abs: 0.9 10*3/uL (ref 0.7–4.0)
MCH: 29.7 pg (ref 26.0–34.0)
MCHC: 32.3 g/dL (ref 30.0–36.0)
MCV: 92 fL (ref 80.0–100.0)
Monocytes Absolute: 0.7 10*3/uL (ref 0.1–1.0)
Monocytes Relative: 5 %
Neutro Abs: 12.2 10*3/uL — ABNORMAL HIGH (ref 1.7–7.7)
Neutrophils Relative %: 89 %
Platelets: 134 10*3/uL — ABNORMAL LOW (ref 150–400)
RBC: 4.61 MIL/uL (ref 3.87–5.11)
RDW: 17.1 % — ABNORMAL HIGH (ref 11.5–15.5)
WBC: 13.8 10*3/uL — ABNORMAL HIGH (ref 4.0–10.5)
nRBC: 0 % (ref 0.0–0.2)

## 2020-09-03 LAB — COMPREHENSIVE METABOLIC PANEL
ALT: 34 U/L (ref 0–44)
AST: 52 U/L — ABNORMAL HIGH (ref 15–41)
Albumin: 4 g/dL (ref 3.5–5.0)
Alkaline Phosphatase: 50 U/L (ref 38–126)
Anion gap: 10 (ref 5–15)
BUN: 17 mg/dL (ref 6–20)
CO2: 24 mmol/L (ref 22–32)
Calcium: 9.2 mg/dL (ref 8.9–10.3)
Chloride: 104 mmol/L (ref 98–111)
Creatinine, Ser: 0.61 mg/dL (ref 0.44–1.00)
GFR, Estimated: >60 mL/min (ref >60)
Glucose, Bld: 152 mg/dL — ABNORMAL HIGH (ref 70–99)
Potassium: 3.9 mmol/L (ref 3.5–5.1)
Sodium: 138 mmol/L (ref 135–145)
Total Bilirubin: 0.4 mg/dL (ref 0.3–1.2)
Total Protein: 7.4 g/dL (ref 6.5–8.1)

## 2020-09-03 MED ORDER — ONDANSETRON HCL 4 MG PO TABS: 4.0000 mg | ORAL_TABLET | Freq: Four times a day (QID) | ORAL | Status: DC | PRN

## 2020-09-03 MED ORDER — NYSTATIN 100000 UNIT/GM EX CREA
TOPICAL_CREAM | Freq: Two times a day (BID) | CUTANEOUS | Status: DC
  Administered 2020-09-03: 1 via TOPICAL
  Filled 2020-09-03: qty 15

## 2020-09-03 MED ORDER — FLUOXETINE HCL 10 MG PO CAPS
10.0000 mg | ORAL_CAPSULE | Freq: Every day | ORAL | Status: DC
  Filled 2020-09-03: qty 1

## 2020-09-03 MED ORDER — SENNOSIDES-DOCUSATE SODIUM 8.6-50 MG PO TABS: 1.0000 | ORAL_TABLET | Freq: Every evening | ORAL | Status: DC | PRN

## 2020-09-03 MED ORDER — NYSTATIN 100000 UNIT/GM EX POWD
Freq: Once | CUTANEOUS | Status: DC
  Filled 2020-09-03: qty 15

## 2020-09-03 MED ORDER — BACLOFEN 1 MG/ML ORAL SUSPENSION
10.0000 mg | Freq: Three times a day (TID) | ORAL | Status: DC
  Administered 2020-09-03 – 2020-09-04 (×2): 10 mg via ORAL
  Filled 2020-09-03 (×4): qty 10

## 2020-09-03 MED ORDER — ENOXAPARIN SODIUM 30 MG/0.3ML IJ SOSY
30.0000 mg | PREFILLED_SYRINGE | INTRAMUSCULAR | Status: DC
  Administered 2020-09-03 – 2020-10-31 (×59): 30 mg via SUBCUTANEOUS
  Filled 2020-09-03 (×59): qty 0.3

## 2020-09-03 MED ORDER — ONDANSETRON HCL 4 MG/2ML IJ SOLN
4.0000 mg | Freq: Four times a day (QID) | INTRAMUSCULAR | Status: DC | PRN
  Administered 2020-09-16 – 2020-09-23 (×2): 4 mg via INTRAVENOUS
  Filled 2020-09-03 (×2): qty 2

## 2020-09-03 MED ORDER — TIAGABINE HCL 2 MG PO TABS
4.0000 mg | ORAL_TABLET | Freq: Two times a day (BID) | ORAL | Status: DC
  Filled 2020-09-03: qty 2

## 2020-09-03 MED ORDER — BACLOFEN 10 MG PO TABS
10.0000 mg | ORAL_TABLET | Freq: Three times a day (TID) | ORAL | Status: DC
  Filled 2020-09-03 (×2): qty 1

## 2020-09-03 MED ORDER — OXCARBAZEPINE 300 MG/5ML PO SUSP
600.0000 mg | Freq: Two times a day (BID) | ORAL | Status: DC
  Administered 2020-09-03 – 2020-09-24 (×41): 600 mg via ORAL
  Filled 2020-09-03 (×44): qty 10

## 2020-09-03 MED ORDER — PANTOPRAZOLE SODIUM 40 MG IV SOLR
40.0000 mg | Freq: Once | INTRAVENOUS | Status: AC
  Administered 2020-09-03: 40 mg via INTRAVENOUS
  Filled 2020-09-03: qty 40

## 2020-09-03 MED ORDER — FLUOXETINE HCL 20 MG/5ML PO SOLN
10.0000 mg | Freq: Every day | ORAL | Status: DC
  Administered 2020-09-03 – 2020-09-24 (×22): 10 mg via ORAL
  Filled 2020-09-03 (×4): qty 2.5
  Filled 2020-09-03: qty 5
  Filled 2020-09-03 (×2): qty 2.5
  Filled 2020-09-03: qty 5
  Filled 2020-09-03 (×14): qty 2.5

## 2020-09-03 MED ORDER — LACTATED RINGERS IV BOLUS
1000.0000 mL | Freq: Once | INTRAVENOUS | Status: AC
  Administered 2020-09-03: 1000 mL via INTRAVENOUS

## 2020-09-03 MED ORDER — LORAZEPAM 1 MG PO TABS: 1.0000 mg | ORAL_TABLET | Freq: Two times a day (BID) | ORAL | Status: DC

## 2020-09-03 MED ORDER — LORAZEPAM 2 MG/ML IJ SOLN
1.0000 mg | Freq: Two times a day (BID) | INTRAMUSCULAR | Status: DC
  Administered 2020-09-03 – 2020-09-05 (×3): 1 mg via INTRAVENOUS
  Filled 2020-09-03 (×3): qty 1

## 2020-09-03 MED ORDER — TIAGABINE HCL 2 MG PO TABS
4.0000 mg | ORAL_TABLET | Freq: Two times a day (BID) | ORAL | Status: DC
  Filled 2020-09-03 (×4): qty 2

## 2020-09-03 MED ORDER — OXCARBAZEPINE 300 MG PO TABS: 600.0000 mg | ORAL_TABLET | Freq: Two times a day (BID) | ORAL | Status: DC

## 2020-09-03 MED ORDER — PANTOPRAZOLE SODIUM 40 MG PO TBEC
40.0000 mg | DELAYED_RELEASE_TABLET | Freq: Every day | ORAL | Status: DC
  Filled 2020-09-03: qty 1

## 2020-09-03 MED ORDER — ACETAMINOPHEN 325 MG PO TABS
650.0000 mg | ORAL_TABLET | Freq: Four times a day (QID) | ORAL | Status: DC | PRN
  Administered 2020-09-23: 20:00:00 650 mg via ORAL
  Filled 2020-09-03: qty 2

## 2020-09-03 MED ORDER — ACETAMINOPHEN 650 MG RE SUPP: 650.0000 mg | Freq: Four times a day (QID) | RECTAL | Status: DC | PRN

## 2020-09-03 NOTE — ED Notes (Signed)
Pt placed on bed alarm

## 2020-09-03 NOTE — ED Notes (Signed)
Pt has been cleaned and new diaper, chuck pads, and linens have been provided.

## 2020-09-03 NOTE — ED Triage Notes (Signed)
Pt BIB EMS because pt brother and father "did not know what to do" after a week of pt caregiver being on vacation.  Pt has not had any meds and family could not provide history of last meal or cleaning for pt.  Pt is nonverbal at baseline. Pt father and brother are both disabled.

## 2020-09-03 NOTE — ED Notes (Signed)
MD notified. RN waiting for additional instructions on next steps for medication route.

## 2020-09-03 NOTE — ED Notes (Signed)
Oral challenge attempted again. Pt is still pursing her lips and turning her head away from RN when RN offers water at this time.

## 2020-09-03 NOTE — ED Notes (Signed)
RN attempted to give pt her medications. Pt spit out medication and RN attempted again with reeducation, and pt turned her head away and kept her lips pursed shut.

## 2020-09-03 NOTE — H&P (Signed)
History and Physical    Alicia Duncan DZH:[id number] DOB: 1975/11/13 DOA: 09/03/2020  PCP: [name], MD  Patient coming from: Home via EMS  I have personally briefly reviewed patient's old medical records in [facility]   Chief Complaint: Failure to thrive  HPI: Alicia Duncan is a 45 y.o. 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  Patient is unable to provide any history due to nonverbal status and cognitive impairment and father unable to provide history due to dementia therefore entirety of history is obtained by EDP and chart review.  Per records, patient usually has a caregiver at home to provide full assistance for patient's care.  Caregiver has been on vacation for several days and family at home have not been able to adequately take care of the patient.  Patient's father reportedly has dementia and brother reportedly has schizophrenia.  Patient is awake and alert but nonverbal and not interactive.  She will look around the room but will not follow commands.  She has not been cooperative with oral challenge attempted by nursing.  She was admitted last in November 2021 due to the same issue of not having a caregiver at home which resulted in seizures due to not receiving her home medications.  ED Course:  Initial vitals show BP 147/76, pulse 90, RR 20, temperature not recorded, SPO2 96% on room air.  Labs show WBC 13.8, hemoglobin 13.7, platelets 134,000, sodium 138, potassium 3.9, bicarb 24, BUN 17, creatinine 0.61, serum glucose 152.  SARS-CoV-2 PCR collected and pending.  Patient was given IV Protonix.  Home Ativan was converted to IV due to patient refusing medications.  She was given 1 L LR.  Topical nystatin was ordered for vulvovaginal candidiasis.  TOC, PT, SLP  consults were placed and pending.  Due to unsafe home environment and failure to thrive, the hospitalist service was consulted to admit.  Review of Systems:  Unable to obtain full review of systems due to nonverbal status.   Past Medical History:  Diagnosis Date  . Dystonia   . Rett syndrome   . Seizures (HCC)     No past surgical history on file.  Social History:  reports that she has never smoked. She has never used smokeless tobacco. She reports that she does not drink alcohol and does not use drugs.  Allergies  Allergen Reactions  . Benadryl [Diphenhydramine]   . Penicillins Other (See Comments)    Has patient had a PCN reaction causing immediate rash, facial/tongue/throat swelling, SOB or lightheadedness with hypotension: Unknown Has patient had a PCN reaction causing severe rash involving mucus membranes or skin necrosis: Unknown Has patient had a PCN reaction that required hospitalization: Unknown Has patient had a PCN reaction occurring within the last 10 years: Unknown If all of the above answers are "NO", then may proceed with Cephalosporin use.     Family History  Problem Relation Age of Onset  . Seizures Mother   . Memory loss Father   . Diverticulosis Father   . Schizophrenia Brother      Prior to Admission medications   Medication Sig Start Date End Date Taking? Authorizing Provider  LORazepam (ATIVAN) 1 MG tablet Take 1 tablet (1 mg total) by mouth 2 (two) times daily. 03/15/20  Yes Glean Salvo, NP  Oxcarbazepine (TRILEPTAL) 300  MG tablet Take 2 tablets (600 mg total) by mouth 2 (two) times daily. 03/15/20  Yes Glean Salvo, NP  tiaGABine (GABITRIL) 4 MG tablet Take 1 tablet (4 mg total) by mouth 2 (two) times daily. 03/15/20  Yes Glean Salvo, NP  baclofen (LIORESAL) 10 MG tablet Take 10 mg by mouth 3 (three) times daily.     [provider]  FLUoxetine (PROZAC) 10 MG capsule Take 10 mg by mouth daily. 10/28/19   [provider]   omeprazole (PRILOSEC) 20 MG capsule Take 20 mg by mouth daily.    [provider]    Physical Exam: Vitals:   09/03/20 1613 09/03/20 1630  BP:  (!) 147/76  Pulse:  90  Resp:  20  SpO2:  96%  Weight: 40.8 kg   Height: 4\' 11"  (1.499 m)    Exam limited due to cognitive disability. Constitutional: Thin woman resting in bed with head elevated, awake and looking around the room but not interactive, appears comfortable Eyes: PERRL, lids and conjunctivae normal ENMT: Mucous membranes are moist. Posterior pharynx clear of any exudate or lesions. Neck: normal, supple, no masses. Respiratory: clear to auscultation anteriorly.  Normal respiratory effort. No accessory muscle use.  Cardiovascular: Regular rate and rhythm, no murmurs / rubs / gallops. No extremity edema. 2+ pedal pulses. Abdomen: no tenderness, no masses palpated. No hepatosplenomegaly.  Musculoskeletal: Underdeveloped with muscle wasting all extremities, no clubbing / cyanosis.  Skin: vulvovaginal candidal rash, no induration Neurologic: Sensation appears intact, strength appears equal Psychiatric: Awake but nonverbal, not following commands.  Labs on Admission: I have personally reviewed following labs and imaging studies  CBC: Recent Labs  Lab 09/03/20 1645  WBC 13.8*  NEUTROABS 12.2*  HGB 13.7  HCT 42.4  MCV 92.0  PLT 134*   Basic Metabolic Panel: Recent Labs  Lab 09/03/20 1645  NA 138  K 3.9  CL 104  CO2 24  GLUCOSE 152*  BUN 17  CREATININE 0.61  CALCIUM 9.2   GFR: Estimated Creatinine Clearance: 56.6 mL/min (by C-G formula based on SCr of 0.61 mg/dL). Liver Function Tests: Recent Labs  Lab 09/03/20 1645  AST 52*  ALT 34  ALKPHOS 50  BILITOT 0.4  PROT 7.4  ALBUMIN 4.0   No results for input(s): LIPASE, AMYLASE in the last 168 hours. No results for input(s): AMMONIA in the last 168 hours. Coagulation Profile: No results for input(s): INR, PROTIME in the last 168 hours. Cardiac  Enzymes: No results for input(s): CKTOTAL, CKMB, CKMBINDEX, TROPONINI in the last 168 hours. BNP (last 3 results) No results for input(s): PROBNP in the last 8760 hours. HbA1C: No results for input(s): HGBA1C in the last 72 hours. CBG: No results for input(s): GLUCAP in the last 168 hours. Lipid Profile: No results for input(s): CHOL, HDL, LDLCALC, TRIG, CHOLHDL, LDLDIRECT in the last 72 hours. Thyroid Function Tests: No results for input(s): TSH, T4TOTAL, FREET4, T3FREE, THYROIDAB in the last 72 hours. Anemia Panel: No results for input(s): VITAMINB12, FOLATE, FERRITIN, TIBC, IRON, RETICCTPCT in the last 72 hours. Urine analysis:    Component Value Date/Time   COLORURINE YELLOW (A) 07/16/2020 1728   APPEARANCEUR CLOUDY (A) 07/16/2020 1728   LABSPEC 1.023 07/16/2020 1728   PHURINE 8.0 07/16/2020 1728   GLUCOSEU NEGATIVE 07/16/2020 1728   HGBUR NEGATIVE 07/16/2020 1728   BILIRUBINUR NEGATIVE 07/16/2020 1728   KETONESUR 5 (A) 07/16/2020 1728   PROTEINUR NEGATIVE 07/16/2020 1728   UROBILINOGEN 1.0 04/25/2013 1427  NITRITE NEGATIVE 07/16/2020 1728   LEUKOCYTESUR NEGATIVE 07/16/2020 1728    Radiological Exams on Admission: No results found.  EKG: Personally reviewed. Sinus rhythm without acute ischemic changes.  Assessment/Plan Principal Problem:   Failure to thrive in adult Active Problems:   Rett syndrome   Seizure disorder (HCC)   Alicia Duncan is a 45 y.o. 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  Failure to thrive/Rett syndrome with cognitive disability/developmental delay/nonverbal with oral phase dysphagia at baseline requiring full-time care/full dependence on others: Presenting after caregiver only on vacation with no arrangement for respite care.  Family unable to adequately care for  patient with reported history of dementia in her father and schizophrenia in her brother whom she lives with.  Similar admit in November 2021 at which time she developed seizures due to not receiving her medications. -TOC consult, needs 24/7 HH versus placement -SLP eval, place on dysphagia 1 thin liquids via baby bottle -Convert home meds to IV/solution as able, crush pills and give in pure or dissolve in liquids if able  Seizure disorder: No seizure activity at this time although at risk if unable to administer her medications. -Convert Ativan to IV 1 mg twice daily -Convert Trileptal to oral suspension 600 mg twice daily -Continue Gabitril 4 mg p.o. twice daily, crush and give with pure/applesauce -If still unable to administer antiepileptics this way then consider NG tube placement -Seizure precautions  Anxiety/depression: Convert fluoxetine to solution and continue IV Ativan as above.  Candida vulvovaginitis: Start nystatin cream twice daily until healed.  DVT prophylaxis: Lovenox Code Status: DNR Family Communication: None available on admission Disposition Plan: From home, dispo pending arrangement of safe environment with 24/7 caregiving on discharge Consults called: None Level of care: Med-Surg Admission status:  Status is: Observation  The patient remains OBS appropriate and will d/c before 2 midnights.  Dispo: The patient is from: Home              Anticipated d/c is to: Home with 24/7 home health versus SNF versus long-term care              Patient currently is medically stable to d/c.   Difficult to place patient Yes  Darreld Mclean MD [facility]  If 7PM-7AM, please contact night-coverage [email or web address]  09/03/2020, 6:49 PM

## 2020-09-03 NOTE — ED Provider Notes (Signed)
[facility] Emergency Department Provider Note  ____________________________________________   Event Date/Time   First MD Initiated Contact with Patient 09/03/20 1605     (approximate)  I have reviewed the triage vital signs and the nursing notes.   HISTORY  Chief Complaint social work   HPI Alicia Duncan is a 45 y.o. female Rett syndrome, severe cognitive disability, seizure disorder nonverbal at baseline who presents to EMS with concerns that she does not have a caregiver at home.  History is very limited as patient is nonverbal and unable provide a history.  Per EMS they were called out by family member as patient caregiver has been on vacation for several days and family at home including patient's father and son do not feel that he can adequately take care of the patient.  Patient's father reportedly has dementia and her brother reportedly has schizophrenia.  I did attempt to reach her father who was unable provide any helpful information secondary to dementia over the phone.  I was able to reach his brother who lives in [place] who is not aware she came to the emergency room and only recently found out today that her caregiver had been on medication and was not exactly sure when the caregiver would return.  No other history is immediately available on patient arrival.         Past Medical History:  Diagnosis Date  . Dystonia   . Rett syndrome   . Seizures [facility])     Patient Active Problem List   Diagnosis Date Noted  . Failure to thrive in adult 09/03/2020  . Goals of care, counseling/discussion   . Palliative care by specialist   . DNR (do not resuscitate)   . Seizures (HCC) 02/13/2020  . Protein-calorie malnutrition, severe 12/20/2019  . Breakthrough seizure (HCC) 12/18/2019  . Cough 05/13/2016  . Pancytopenia (HCC) 05/13/2016  . Iron deficiency anemia 05/13/2016  . SIRS (systemic inflammatory response syndrome) (HCC) 05/11/2016  .  Dental infection 05/11/2016  . 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 Seizure disorder (HCC) 05/11/2016  . Sepsis (HCC) 05/11/2016  . Seizure (HCC) 04/25/2013    No past surgical history on file.  Prior to Admission medications   Medication Sig Start Date End Date Taking? Authorizing Provider  LORazepam (ATIVAN) 1 MG tablet Take 1 tablet (1 mg total) by mouth 2 (two) times daily. 03/15/20  Yes Glean Salvo, NP  Oxcarbazepine (TRILEPTAL) 300 MG tablet Take 2 tablets (600 mg total) by mouth 2 (two) times daily. 03/15/20  Yes Glean Salvo, NP  tiaGABine (GABITRIL) 4 MG tablet Take 1 tablet (4 mg total) by mouth 2 (two) times daily. 03/15/20  Yes Glean Salvo, NP  baclofen (LIORESAL) 10 MG tablet Take 10 mg by mouth 3 (three) times daily.     [provider]  FLUoxetine (PROZAC) 10 MG capsule Take 10 mg by mouth daily. 10/28/19   [provider]  omeprazole (PRILOSEC) 20 MG capsule Take 20 mg by mouth daily.    [provider]    Allergies Benadryl [diphenhydramine] and Penicillins  Family History  Problem Relation Age of Onset  . Seizures Mother   . Memory loss Father   . Diverticulosis Father   . Schizophrenia Brother     Social History Social History   Tobacco Use  . Smoking status: Never Smoker  . Smokeless tobacco: Never Used  Substance Use Topics  . Alcohol use: No  . Drug use: No  Review of Systems  Review of Systems  Unable to perform ROS: Patient nonverbal      ____________________________________________   PHYSICAL EXAM:  VITAL SIGNS: ED Triage Vitals  Enc Vitals Group     BP 09/03/20 1630 (!) 147/76     Pulse Rate 09/03/20 1630 90     Resp 09/03/20 1630 20     Temp --      Temp src --      SpO2 09/03/20 1630 96 %     Weight 09/03/20 1613 90 lb (40.8 kg)     Height 09/03/20 1613 4\' 11"  (1.499 m)     Head Circumference --      Peak Flow --      Pain Score --      Pain Loc --      Pain Edu? --      Excl. in GC? --     Vitals:   09/03/20 1630  BP: (!) 147/76  Pulse: 90  Resp: 20  SpO2: 96%   Physical Exam Vitals and nursing note reviewed.  Constitutional:      Appearance: Normal appearance. She is cachectic.  HENT:     Head: Normocephalic and atraumatic.     Right Ear: External ear normal.     Left Ear: External ear normal.     Nose: Nose normal.     Mouth/Throat:     Mouth: Mucous membranes are moist.  Eyes:     Extraocular Movements: Extraocular movements intact.     Pupils: Pupils are equal, round, and reactive to light.  Cardiovascular:     Rate and Rhythm: Normal rate and regular rhythm.     Pulses: Normal pulses.  Pulmonary:     Effort: Pulmonary effort is normal. No respiratory distress.     Breath sounds: No wheezing.  Abdominal:     Tenderness: There is no right CVA tenderness or left CVA tenderness.  Musculoskeletal:     Cervical back: No rigidity.     Right lower leg: No edema.     Left lower leg: No edema.  Skin:    General: Skin is warm.     Capillary Refill: Capillary refill takes less than 2 seconds.  Neurological:     Mental Status: She is alert. Mental status is at baseline.     There is some erythema macular patches around the vagina and some small punctate lesions on the gluteus consistent with possible folliculitis versus dermatitis. ____________________________________________   LABS (all labs ordered are listed, but only abnormal results are displayed)  Labs Reviewed  CBC WITH DIFFERENTIAL/PLATELET - Abnormal; Notable for the following components:      Result Value   WBC 13.8 (*)    RDW 17.1 (*)    Platelets 134 (*)    Neutro Abs 12.2 (*)    All other components within normal limits  COMPREHENSIVE METABOLIC PANEL - Abnormal; Notable for the following components:   Glucose, Bld 152 (*)    AST 52 (*)    All other components within normal limits  RESP PANEL BY RT-PCR (FLU A&B, COVID) [id number]    ____________________________________________  EKG  Sinus rhythm with ventricular rate of 86 without evidence of acute ischemia or significant underlying arrhythmia.  Unremarkable intervals.  ____________________________________________  RADIOLOGY  ED MD interpretation:    Official radiology report(s): No results found.  ____________________________________________   PROCEDURES  Procedure(s) performed (including Critical Care):  Procedures   ____________________________________________   INITIAL IMPRESSION / ASSESSMENT AND  PLAN / ED COURSE      Patient presents with above-stated history and exam via EMS from home with concerns that she is unable to carefor self by family.  Patient is unable provide any history secondary to being nonverbal at baseline.  Family member I was able to reach on first attempt sounded somewhat demented confused over the phone which is accurate with reported history of dementia.  Patient does not appear acutely injured or septic but she does have an infection appears to be vaginal candidiasis and possibly some dermatitis on her gluteal cleft from leaking urine.  She otherwise appears calm although free to take anything by mouth when attempting to give doses of her home seizure medicines.  Given I am concerned she will have a safe place to go and is unable to perform her ADLs on her own and is not tolerating her medicines by mouth or any oral nutrition will admit to medicine service.  She will likely require PT OT and speech and swallowing consults to assess for placement as given history of caregivers with reported dementia and intermittent help from home health agency do not think she go back home at this time.       ____________________________________________   FINAL CLINICAL IMPRESSION(S) / ED DIAGNOSES  Final diagnoses:  Requires assistance with activities of daily living (ADL)  Nonverbal  Vaginal candidiasis    Medications  baclofen  (LIORESAL) tablet 10 mg (has no administration in time range)  FLUoxetine (PROZAC) capsule 10 mg (has no administration in time range)  Oxcarbazepine (TRILEPTAL) tablet 600 mg (has no administration in time range)  tiaGABine (GABITRIL) tablet 4 mg (has no administration in time range)  LORazepam (ATIVAN) injection 1 mg (1 mg Intravenous Given 09/03/20 1817)  nystatin (MYCOSTATIN/NYSTOP) topical powder (has no administration in time range)  lactated ringers bolus 1,000 mL (1,000 mLs Intravenous New Bag/Given 09/03/20 1817)  pantoprazole (PROTONIX) injection 40 mg (40 mg Intravenous Given 09/03/20 1817)     ED Discharge Orders    None       Note:  This document was prepared using Dragon voice recognition software and may include unintentional dictation errors.   Gilles Chiquito, MD 09/03/20 [phone]

## 2020-09-03 NOTE — ED Notes (Signed)
RN attempting to give pt water and ice cream. Pt is turning her head away, swatting RN's hand away, and pursing her lips closed.  MD notified and  Making plans for alternate medication route.

## 2020-09-04 DIAGNOSIS — R627 Adult failure to thrive: Secondary | ICD-10-CM | POA: Diagnosis not present

## 2020-09-04 LAB — COMPREHENSIVE METABOLIC PANEL
ALT: 28 U/L (ref 0–44)
AST: 34 U/L (ref 15–41)
Albumin: 3.1 g/dL — ABNORMAL LOW (ref 3.5–5.0)
Alkaline Phosphatase: 34 U/L — ABNORMAL LOW (ref 38–126)
Anion gap: 6 (ref 5–15)
BUN: 12 mg/dL (ref 6–20)
CO2: 24 mmol/L (ref 22–32)
Calcium: 8.3 mg/dL — ABNORMAL LOW (ref 8.9–10.3)
Chloride: 107 mmol/L (ref 98–111)
Creatinine, Ser: 0.65 mg/dL (ref 0.44–1.00)
GFR, Estimated: >60 mL/min (ref >60)
Glucose, Bld: 79 mg/dL (ref 70–99)
Potassium: 3.9 mmol/L (ref 3.5–5.1)
Sodium: 137 mmol/L (ref 135–145)
Total Bilirubin: 0.6 mg/dL (ref 0.3–1.2)
Total Protein: 5.9 g/dL — ABNORMAL LOW (ref 6.5–8.1)

## 2020-09-04 LAB — MAGNESIUM: Magnesium: 1.9 mg/dL (ref 1.7–2.4)

## 2020-09-04 LAB — PHOSPHORUS: Phosphorus: 4 mg/dL (ref 2.5–4.6)

## 2020-09-04 MED ORDER — DEXTROSE-NACL 5-0.45 % IV SOLN: INTRAVENOUS | Status: DC

## 2020-09-04 MED ORDER — BACLOFEN 1 MG/ML ORAL SUSPENSION
10.0000 mg | Freq: Three times a day (TID) | ORAL | Status: DC
  Filled 2020-09-04 (×2): qty 10

## 2020-09-04 MED ORDER — ENSURE ENLIVE PO LIQD
237.0000 mL | Freq: Three times a day (TID) | ORAL | Status: DC
  Administered 2020-09-05 – 2020-09-23 (×55): 237 mL via ORAL

## 2020-09-04 MED ORDER — TIAGABINE HCL 4 MG PO TABS
4.0000 mg | ORAL_TABLET | Freq: Two times a day (BID) | ORAL | Status: DC
  Administered 2020-09-04 – 2020-09-24 (×39): 4 mg via ORAL
  Filled 2020-09-04 (×46): qty 1

## 2020-09-04 MED ORDER — ORA-SWEET PO SYRP
10.0000 mg | Freq: Three times a day (TID) | ORAL | Status: DC
  Filled 2020-09-04 (×5): qty 0.5

## 2020-09-04 NOTE — Evaluation (Signed)
Clinical/Bedside Swallow Evaluation Patient Details  Name: Alicia Duncan MRN: [id number] Date of Birth: [birth year]  Today's Date: 09/04/2020 Time: SLP Start Time (ACUTE ONLY): 0825 SLP Stop Time (ACUTE ONLY): 0925 SLP Time Calculation (min) (ACUTE ONLY): 60 min  Past Medical History:  Past Medical History:  Diagnosis Date  . Dystonia   . Rett syndrome   . Seizures (HCC)    Past Surgical History: No past surgical history on file. HPI:  Pt is a 45 y.o. female with past medical history of Rett syndrome, Cognitive decline/developmental status decline and noverbal, seizures, cachectic-appearing, and dystonia who presents to the ED for seizure.  History is limited as patient is nonverbal at baseline and caregiver/family not currently present.  EMS reports they brought pt in d/t unsafe situation at home. Per records, patient usually has a caregiver at home to provide full assistance for patient's care.  Caregiver has been on vacation for several days and family at home have not been able to adequately take care of the patient.  Patient's father reportedly has dementia and brother reportedly has schizophrenia.  She is nonverbal at baseline and does not follow commands.  She appears at her Neurological baseline. Her caregiver was apparently away b/f at the last admission last year and pt had not been given her medications then either.  Patient currently takes Ativan, oxcarbazepine, and Gabitril for seizure control.  CXR: no active disease. Pt has BASELINE DYSPHAGIA and drinks thin liquids VIA BABY BOTTLE. She is able to tolerate TSP trials of Puree fed to her IF she wants it.   Assessment / Plan / Recommendation Clinical Impression  Pt has Baseline Oral phase Dysphagia as per previous assessments during admits in 2021, 2018. Currently, pt has a NG for meds, some TFs. At this BSE, pt appears to present at/near her baseline re: oropharyngeal swallowing. Per her baseline, pt exhibits Oral phase deficits  but w/ a modified diet(puree) and use of a Baby Bottle for drinking thin liquids, pt was able to consume po's of thin liquids and purees w/ no overt, clinical s/s of aspiration noted.   Pt presents w/ oral phase dysphagia but w/ support of feeding and use of a Baby Bottle for drinking liquids, as well as general aspiration precautions, she was able to consume po trials of a modified diet(puree) w/ no overt, clinical s/s of aspiration noted. Per chart notes/history, pt uses a Baby Bottle for drinking liquids at home(w/ the Father, Caregiver). With the level of Cognitive/mental/developmental status decline Baseline, and the need for FULL, 100% Supervision/Support at all meals for feeding(all ADLs), pt is at risk for not meeting her nutrition/hydration needs adequately. Recommend Dietician f/u for needs. A Caregiver at home seems to address much of the feeding/care; the Caregiver has not been present for a few days leading to this admit. (similar situation last admit, per MD notes) Suspect pt's oral phase dysphagia is directly impacted by her Baseline dx of Rett Syndrome and other Cognitive/Mental/Developmenta abilities decline(pt is a 45 y.o. female with medical history significant of seizure disorder, mutism, dystonia, Cognitive decline/mental abilities decline, and Rett syndrome).  During the oral phase, pt exhibited a more tongue forward/protrusion during bolus prep/acceptance w/ anterior loss occurring. W/ continued support during bolus delivery, pt accepted bolus trials (pureed foods) orally. Noted lingual smacking and min increased oral phase time for bolus management, A-P transfer, and overall oral clearing moreo w/ purees. W/ Baby Bottle drinking, pt exhibited an adequate labial seal around nipple then consecutive sucking  to pull fluids from nipple and consume ~12+ ozs of fluids, thin liquid consistency w/ No overt, clinical s/s of aspiration noted - no decline in respiratory status, no coughing/throat  clearing. When pt did not want po's, she turned her head and put her hand up. OM exam cursory - poor dentition status but adequate lingual/labial strength/control/coordination to drink from a Baby Bottle.   Recommend continue a Puree diet (dysphagia level 1) w/ thin liquids VIA Baby Bottle - Dr. Theora Gianotti w/ #4 nipple utilized this session -- similar flow nipples could be used. General aspiration precautions. Pills Crushed and given in Puree; dissolved in liquids if completely dissolvable for Bottle. FULL, 100% Supervision/Support feeding at all meals. Dietician f/u for support. Pt appears to enjoy drinking Ensure at her baseline.  Pt appears at her Baseline as per chart notes/presentation. Education completed w/ NSG; precautions posted and Baby Bottle/nipples left in room. MD updated. NSG to reconsult if new needs arise this admit.  SLP Visit Diagnosis: Dysphagia, oral phase (R13.11) (baseline Cognitive decline; drinks from baby bottle)    Aspiration Risk  Mild aspiration risk;Risk for inadequate nutrition/hydration (reduced w/ feeding precautions/assist)    Diet Recommendation   Puree diet (dysphagia level 1) w/ thin liquids VIA Baby Bottle - Dr. Theora Gianotti w/ #4 nipple utilized this session -- similar flow nipples could be used. General aspiration precautions. FULL, 100% Supervision/Support feeding at all meals. Pt likes to drink Ensure -- Dietician f/u to support.  Medication Administration: Crushed with puree    Other  Recommendations Recommended Consults:  (Palliative Care; Dietician f/u) Oral Care Recommendations: Oral care BID;Oral care before and after PO;Staff/trained caregiver to provide oral care Other Recommendations:  (n/a)   Follow up Recommendations None      Frequency and Duration  (n/a)   (n/a)       Prognosis Prognosis for Safe Diet Advancement: Guarded (-Fair) Barriers to Reach Goals: Cognitive deficits;Language deficits;Time post onset;Severity of deficits;Behavior       Swallow Study   General Date of Onset: 09/03/20 HPI: Pt is a 45 y.o. female with past medical history of Rett syndrome, Cognitive decline/developmental status decline and noverbal, seizures, cachectic-appearing, and dystonia who presents to the ED for seizure.  History is limited as patient is nonverbal at baseline and caregiver/family not currently present.  EMS reports they brought pt in d/t unsafe situation at home. Per records, patient usually has a caregiver at home to provide full assistance for patient's care.  Caregiver has been on vacation for several days and family at home have not been able to adequately take care of the patient.  Patient's father reportedly has dementia and brother reportedly has schizophrenia.  She is nonverbal at baseline and does not follow commands.  She appears at her Neurological baseline. Her caregiver was apparently away b/f at the last admission last year and pt had not been given her medications then either.  Patient currently takes Ativan, oxcarbazepine, and Gabitril for seizure control.  CXR: no active disease. Type of Study: Bedside Swallow Evaluation Previous Swallow Assessment: 2021 - see chart notes, multiple Diet Prior to this Study: Dysphagia 1 (puree);Thin liquids (via baby bottle - baseline) Temperature Spikes Noted: No (min elevated) Respiratory Status: Room air History of Recent Intubation: No Behavior/Cognition: Alert;Cooperative;Pleasant mood;Confused;Doesn't follow directions Oral Cavity Assessment:  (CNT d/t cognitive decline) Oral Care Completed by SLP: Recent completion by staff Oral Cavity - Dentition: Missing dentition;Poor condition Vision:  (n/a) Self-Feeding Abilities: Needs assist;Needs set up;Total assist (she can hold  her own bottle to drink from) Patient Positioning: Upright in bed Baseline Vocal Quality:  (nonverbal) Volitional Cough: Cognitively unable to elicit Volitional Swallow: Unable to elicit    Oral/Motor/Sensory  Function Overall Oral Motor/Sensory Function:  (adequate for accepting purees; drinking from bottle)   Ice Chips Ice chips: Not tested   Thin Liquid Thin Liquid: Within functional limits Presentation:  (drinking from baby bottle - ~12 ozs) Other Comments: using a baby bottle to drink from is Baseline for pt    Nectar Thick Nectar Thick Liquid: Not tested   Honey Thick Honey Thick Liquid: Not tested   Puree Puree: Impaired Presentation: Spoon (fed; 8 trials b/t bottle drinking) Oral Phase Impairments: Poor awareness of bolus Oral Phase Functional Implications: Prolonged oral transit Pharyngeal Phase Impairments:  (no overt s/s)   Solid     Solid: Not tested Other Comments: baseline on puree        Jerilynn Som, MS, [facility] [phone] Alicia Duncan 09/04/2020,10:58 AM

## 2020-09-04 NOTE — Progress Notes (Signed)
PT Cancellation Note  Patient Details Name: Alicia Duncan MRN: [id number] DOB: [birth year]   Cancelled Treatment:    Reason Eval/Treat Not Completed: PT screened, no needs identified, will sign off. Chart reviewed. Patient is non ambulatory and requires total care at baseline. No apparent acute PT needs at this time. PT will sign off.   Donna Bernard, PT, MPT  Ina Homes 09/04/2020, 10:01 AM

## 2020-09-04 NOTE — Progress Notes (Signed)
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  GOAL:     MONITOR:  PO intake,Supplement acceptance,Labs,Weight trends  REASON FOR ASSESSMENT:  Other (Comment),Malnutrition Screening Tool (Low BMI, 13.48)    ASSESSMENT:  Pt brought to ED after concerns were raised over her care at home. Caregiver has been on vacation for several days and family at home have not been able to adequately take care of the patient. 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   Pt sleeping at the time of visit, did not wake when name was called. Pt nonverbal at baseline. Dinner tray at bedside had just been delivered. SLP consulting, pt is to drink all liquids through a baby bottle.   Severe muscle and fat deficits noted on exam, severely underweight with little to no muscle mass on the lower half of pt's body. Noted that pt is nonambulatory at baseline. 11.6% weight loss in the last 6 months (11/12-5/30) which is severe. Will add nutrition supplements to augment intake.    Average Meal Intake: . 5/30-5/31: 50% intake x 1 recorded meal  Nutritionally Relevant Medications Scheduled Meds: . baclofen  10 mg Oral TID  . OXcarbazepine  600 mg Oral BID  . tiaGABine  4 mg Oral BID   PRN Meds: ondansetron, senna-docusate  Labs reviewed  NUTRITION -  FOCUSED PHYSICAL EXAM: Flowsheet Row Most Recent Value  Orbital Region Mild depletion  Upper Arm Region Severe depletion  Thoracic and Lumbar Region Severe depletion  Buccal Region Mild depletion  Temple Region Mild depletion  Clavicle Bone Region Mild depletion  Clavicle and Acromion Bone Region Severe depletion  Scapular Bone Region Severe depletion  Dorsal Hand Mild depletion  Patellar Region Severe depletion  Anterior Thigh Region Severe depletion  Posterior Calf Region Severe depletion  Edema (RD Assessment) None  Hair Reviewed  Eyes Unable to assess  Mouth Unable to assess  Skin Reviewed  Nails Reviewed     Diet Order:   Diet Order            DIET - DYS 1 Room service appropriate? Yes with Assist; Fluid consistency: Thin  Diet effective now                 EDUCATION NEEDS:  Not appropriate for education at this time  Skin:  Skin Assessment: Reviewed RN Assessment  Last BM:  5/30 per RN documentation  Height:  Ht Readings from Last 1 Encounters:  09/03/20 4\' 11"  (1.499 m)    Weight:  Wt Readings from Last 1 Encounters:  09/03/20 30.3 kg    Ideal Body Weight:  44.3 kg  BMI:  Body mass index is 13.49 kg/m.  Estimated Nutritional Needs:   Kcal:  1400-1600 kcal/d  Protein:  70-85 g/d  Fluid:  >1500 mL/d  09/05/20, RD, LDN Clinical Dietitian Pager on Amion

## 2020-09-04 NOTE — Progress Notes (Signed)
OT Cancellation Note  Patient Details Name: Alicia Duncan MRN: [id number] DOB: 06/05/1975   Cancelled Treatment:    Reason Eval/Treat Not Completed: OT screened, no needs identified, will sign off. Pt is bed bound with dependent care at baseline. No acute OT needs at this time. OT to SIGN OFF.  Jackquline Denmark, MS, OTR/L , CBIS ascom [phone]  09/04/20, 8:50 AM   09/04/2020, 8:50 AM

## 2020-09-04 NOTE — Plan of Care (Signed)
Pt admitted to 1C overnight from ED. Nonverbal at baseline, briefly will make eye contact but does not follow commands. Unable to complete admission intake d/t mental status and no family present at bedside. VSS, on room air. No s/s pain or difficulty breathing. Incontinence care provided, nystatin powder to groin and mepilex placed over small blanchable red area on sacrum. Able to take PO meds in applesauce followed by a moderate amount of ensure; no issues noted with swallowing. Fall/safety/seizure precautions in place.   Problem: Education: Goal: Knowledge of General Education information will improve Description: Including pain rating scale, medication(s)/side effects and non-pharmacologic comfort measures 09/04/2020 0111 by Michail Sermon, RN Outcome: Progressing 09/03/2020 2343 by Michail Sermon, RN Outcome: Progressing   Problem: Health Behavior/Discharge Planning: Goal: Ability to manage health-related needs will improve 09/04/2020 0111 by Michail Sermon, RN Outcome: Progressing 09/03/2020 2343 by Michail Sermon, RN Outcome: Progressing   Problem: Clinical Measurements: Goal: Ability to maintain clinical measurements within normal limits will improve 09/04/2020 0111 by Michail Sermon, RN Outcome: Progressing 09/03/2020 2343 by Michail Sermon, RN Outcome: Progressing Goal: Will remain free from infection 09/04/2020 0111 by Michail Sermon, RN Outcome: Progressing 09/03/2020 2343 by Michail Sermon, RN Outcome: Progressing Goal: Diagnostic test results will improve 09/04/2020 0111 by Michail Sermon, RN Outcome: Progressing 09/03/2020 2343 by Michail Sermon, RN Outcome: Progressing Goal: Respiratory complications will improve 09/04/2020 0111 by Michail Sermon, RN Outcome: Progressing 09/03/2020 2343 by Michail Sermon, RN Outcome: Progressing Goal: Cardiovascular complication will be avoided 09/04/2020 0111 by Michail Sermon, RN Outcome: Progressing 09/03/2020 2343 by  Michail Sermon, RN Outcome: Progressing   Problem: Activity: Goal: Risk for activity intolerance will decrease 09/04/2020 0111 by Michail Sermon, RN Outcome: Progressing 09/03/2020 2343 by Michail Sermon, RN Outcome: Progressing   Problem: Nutrition: Goal: Adequate nutrition will be maintained 09/04/2020 0111 by Michail Sermon, RN Outcome: Progressing 09/03/2020 2343 by Michail Sermon, RN Outcome: Progressing   Problem: Coping: Goal: Level of anxiety will decrease 09/04/2020 0111 by Michail Sermon, RN Outcome: Progressing 09/03/2020 2343 by Michail Sermon, RN Outcome: Progressing   Problem: Pain Managment: Goal: General experience of comfort will improve 09/04/2020 0111 by Michail Sermon, RN Outcome: Progressing 09/03/2020 2343 by Michail Sermon, RN Outcome: Progressing   Problem: Safety: Goal: Ability to remain free from injury will improve 09/04/2020 0111 by Michail Sermon, RN Outcome: Progressing 09/03/2020 2343 by Michail Sermon, RN Outcome: Progressing   Problem: Skin Integrity: Goal: Risk for impaired skin integrity will decrease 09/04/2020 0111 by Michail Sermon, RN Outcome: Progressing 09/03/2020 2343 by Michail Sermon, RN Outcome: Progressing   Problem: Elimination: Goal: Will not experience complications related to bowel motility 09/04/2020 0111 by Michail Sermon, RN Outcome: Progressing 09/03/2020 2343 by Michail Sermon, RN Outcome: Progressing Goal: Will not experience complications related to urinary retention 09/04/2020 0111 by Michail Sermon, RN Outcome: Progressing 09/03/2020 2343 by Michail Sermon, RN Outcome: Progressing

## 2020-09-04 NOTE — Progress Notes (Signed)
PROGRESS NOTE    Alicia Duncan  FKC:[id number] DOB: 05-01-1975 DOA: 09/03/2020 PCP: [name], MD    Brief Narrative:  Alicia Duncan is a 45 y.o. 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  Patient is unable to provide any history due to nonverbal status and cognitive impairment and father unable to provide history due to dementia therefore entirety of history is obtained by EDP and chart review.Caregiver has been on vacation for several days and family at home have not been able to adequately take care of the patient.  Patient's father reportedly has dementia and brother reportedly has schizophrenia. Thus patient was admitted to the hospital until care taker returns. Per case management today, this usually happens once a year when caregiver goes on vacation. Not sure when care giver will return at this time      Consultants:     Procedures:   Antimicrobials:       Subjective: Nonverbal, sleeping and snoring . Does not wake up with sternal rub  Objective: Vitals:   09/04/20 0542 09/04/20 0728 09/04/20 0803 09/04/20 1120  BP: 98/70 (!) 170/92 91/66 111/77  Pulse: (!) 56 81 63 96  Resp: 16 17 16 17   Temp: (!) 96.9 F (36.1 C) 97.6 F (36.4 C) 97.7 F (36.5 C) 97.6 F (36.4 C)  TempSrc:      SpO2: 100% 100% 96% 94%  Weight:      Height:        Intake/Output Summary (Last 24 hours) at 09/04/2020 1201 Last data filed at 09/04/2020 1017 Gross per 24 hour  Intake 1070 ml  Output --  Net 1070 ml   Filed Weights   09/03/20 1613 09/03/20 2240  Weight: 40.8 kg 30.3 kg    Examination:  General exam: Appears calm and comfortable , sleeping and snoring Respiratory system: Clear to auscultation. Respiratory effort normal. Cardiovascular system: S1 & S2 heard, RRR.  No JVD, murmurs, rubs, gallops or clicks.  Gastrointestinal system: Abdomen is nondistended, soft and nontender. Normal bowel sounds heard. Central nervous system:unable to assess Extremities: no edema Skin: warm dry     Data Reviewed: I have personally reviewed following labs and imaging studies  CBC: Recent Labs  Lab 09/03/20 1645  WBC 13.8*  NEUTROABS 12.2*  HGB 13.7  HCT 42.4  MCV 92.0  PLT 134*   Basic Metabolic Panel: Recent Labs  Lab 09/03/20 1645 09/04/20 0351  NA 138 137  K 3.9 3.9  CL 104 107  CO2 24 24  GLUCOSE 152* 79  BUN 17 12  CREATININE 0.61 0.65  CALCIUM 9.2 8.3*  MG  --  1.9  PHOS  --  4.0   GFR: Estimated Creatinine Clearance: 42 mL/min (by C-G formula based on SCr of 0.65 mg/dL). Liver Function Tests: Recent Labs  Lab 09/03/20 1645 09/04/20 0351  AST 52* 34  ALT 34 28  ALKPHOS 50 34*  BILITOT 0.4 0.6  PROT 7.4 5.9*  ALBUMIN 4.0 3.1*   No results for input(s): LIPASE, AMYLASE in the last 168 hours. No results for input(s): AMMONIA in the last 168 hours. Coagulation Profile: No results for input(s): INR, PROTIME in the last 168 hours. Cardiac Enzymes: No results for input(s): CKTOTAL, CKMB, CKMBINDEX, TROPONINI in the last 168 hours. BNP (last 3 results) No results for input(s): PROBNP  in the last 8760 hours. HbA1C: No results for input(s): HGBA1C in the last 72 hours. CBG: No results for input(s): GLUCAP in the last 168 hours. Lipid Profile: No results for input(s): CHOL, HDL, LDLCALC, TRIG, CHOLHDL, LDLDIRECT in the last 72 hours. Thyroid Function Tests: No results for input(s): TSH, T4TOTAL, FREET4, T3FREE, THYROIDAB in the last 72 hours. Anemia Panel: No results for input(s): VITAMINB12, FOLATE, FERRITIN, TIBC, IRON, RETICCTPCT in the last 72 hours. Sepsis Labs: No results for input(s): PROCALCITON, LATICACIDVEN in the last 168 hours.  Recent Results (from the past 240 hour(s))  Resp Panel by RT-PCR (Flu A&B, Covid)  Nasopharyngeal Swab     Status: None   Collection Time: 09/03/20  4:45 PM   Specimen: Nasopharyngeal Swab; Nasopharyngeal(NP) swabs in vial transport medium  Result Value Ref Range Status   SARS Coronavirus 2 by RT PCR NEGATIVE NEGATIVE Final    Comment: (NOTE) SARS-CoV-2 target nucleic acids are NOT DETECTED.  The SARS-CoV-2 RNA is generally detectable in upper respiratory specimens during the acute phase of infection. The lowest concentration of SARS-CoV-2 viral copies this assay can detect is 138 copies/mL. A negative result does not preclude SARS-Cov-2 infection and should not be used as the sole basis for treatment or other patient management decisions. A negative result may occur with  improper specimen collection/handling, submission of specimen other than nasopharyngeal swab, presence of viral mutation(s) within the areas targeted by this assay, and inadequate number of viral copies(<138 copies/mL). A negative result must be combined with clinical observations, patient history, and epidemiological information. The expected result is Negative.  Fact Sheet for Patients:  BloggerCourse.com  Fact Sheet for Healthcare Providers:  SeriousBroker.it  This test is no t yet approved or cleared by the [place] FDA and  has been authorized for detection and/or diagnosis of SARS-CoV-2 by FDA under an Emergency Use Authorization (EUA). This EUA will remain  in effect (meaning this test can be used) for the duration of the COVID-19 declaration under Section 564(b)(1) of the Act, 21 U.S.C.section 360bbb-3(b)(1), unless the authorization is terminated  or revoked sooner.       Influenza A by PCR NEGATIVE NEGATIVE Final   Influenza B by PCR NEGATIVE NEGATIVE Final    Comment: (NOTE) The Xpert Xpress SARS-CoV-2/FLU/RSV plus assay is intended as an aid in the diagnosis of influenza from Nasopharyngeal swab specimens and should not be  used as a sole basis for treatment. Nasal washings and aspirates are unacceptable for Xpert Xpress SARS-CoV-2/FLU/RSV testing.  Fact Sheet for Patients: BloggerCourse.com  Fact Sheet for Healthcare Providers: SeriousBroker.it  This test is not yet approved or cleared by the [place] FDA and has been authorized for detection and/or diagnosis of SARS-CoV-2 by FDA under an Emergency Use Authorization (EUA). This EUA will remain in effect (meaning this test can be used) for the duration of the COVID-19 declaration under Section 564(b)(1) of the Act, 21 U.S.C. section 360bbb-3(b)(1), unless the authorization is terminated or revoked.  Performed at [facility] [place] [facility], [address], [place], [place] [address]          Radiology Studies: No results found.      Scheduled Meds: . baclofen  10 mg Oral TID  . enoxaparin (LOVENOX) injection  30 mg Subcutaneous Q24H  . FLUoxetine  10 mg Oral Daily  . LORazepam  1 mg Intravenous BID  . nystatin cream   Topical BID  . OXcarbazepine  600 mg Oral BID  . tiaGABine  4 mg  Oral BID   Continuous Infusions:  Assessment & Plan:   Principal Problem:   Failure to thrive in adult Active Problems:   Rett syndrome   Seizure disorder (HCC)   Alicia Duncan is a 45 y.o. 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   Failure to thrive/Rett syndrome with cognitive disability/developmental delay/nonverbal with oral phase dysphagia at baseline requiring full-time care/full dependence on others: Presenting after caregiver only on vacation with no arrangement for respite care.  Family unable to adequately care for patient with reported history  of dementia in her father and schizophrenia in her brother whom she lives with.  Similar admit in November 2021 at which time she developed seizures due to not receiving her medications. 5/31-TOC consulted, will find out when caregiver will return as patient needs 24/7 care Speech consulted please see note Converted home meds to IV/solution as able, crushed pills and given.   Continue ivf  Seizure disorder: No seizure activity at this time although at risk if unable to administer her medications. 5/31 continue Ativan 1 mg IV twice daily Continue Trileptal Continue Gabitril Seizure precautions    Anxiety/depression: Continue fluoxetine and IV Ativan   convert fluoxetine to solution and continue IV Ativan as above.   Candida vulvovaginitis: Continue nystatin cream until healed   DVT prophylaxis: Lovenox Code Status: DNR Family Communication: None at bedside  Status is: Observation  The patient remains OBS appropriate and will d/c before 2 midnights.  Dispo: The patient is from: Home              Anticipated d/c is to: TBD              Patient currently is not medically stable to d/c.   Difficult to place patient No    Disposition- case management will find out when caregiver returns inorder to dc pt back home.        LOS: 0 days   Time spent: 35 min with >50% on coc    Lynn Ito, MD [facility] Pager [phone]  If 7PM-7AM, please contact night-coverage 09/04/2020, 12:01 PM

## 2020-09-04 NOTE — TOC Progression Note (Signed)
Transition of Care [facility]) - Progression Note    Patient Details  Name: Alicia Duncan MRN: [id number] Date of Birth: [birth year]  Transition of Care [facility]) CM/SW Contact  Hetty Ely, RN Phone Number: 09/04/2020, 2:27 PM  Clinical Narrative: Called and spoke with Steward Ros who informed me that patients caregiver Maralyn Sago should be back from vacation and can take care of patient. Called [facility], who supervises Sarah to get information on discharge process, left message answer pending. Called Bret back and was given Maralyn Sago number [phone], spoke with Maralyn Sago who says she is back and patient can be discharged to father's home and she will take care of her as previously assigned.           Expected Discharge Plan and Services                                                 Social Determinants of Health (SDOH) Interventions    Readmission Risk Interventions No flowsheet data found.

## 2020-09-05 DIAGNOSIS — A419 Sepsis, unspecified organism: Secondary | ICD-10-CM | POA: Diagnosis not present

## 2020-09-05 DIAGNOSIS — Z79899 Other long term (current) drug therapy: Secondary | ICD-10-CM | POA: Diagnosis not present

## 2020-09-05 DIAGNOSIS — Z880 Allergy status to penicillin: Secondary | ICD-10-CM | POA: Diagnosis not present

## 2020-09-05 DIAGNOSIS — Z681 Body mass index (BMI) 19 or less, adult: Secondary | ICD-10-CM | POA: Diagnosis not present

## 2020-09-05 DIAGNOSIS — Z818 Family history of other mental and behavioral disorders: Secondary | ICD-10-CM | POA: Diagnosis not present

## 2020-09-05 DIAGNOSIS — R627 Adult failure to thrive: Secondary | ICD-10-CM | POA: Diagnosis present

## 2020-09-05 DIAGNOSIS — K567 Ileus, unspecified: Secondary | ICD-10-CM | POA: Diagnosis not present

## 2020-09-05 DIAGNOSIS — Z888 Allergy status to other drugs, medicaments and biological substances status: Secondary | ICD-10-CM | POA: Diagnosis not present

## 2020-09-05 DIAGNOSIS — K5900 Constipation, unspecified: Secondary | ICD-10-CM | POA: Diagnosis not present

## 2020-09-05 DIAGNOSIS — F32A Depression, unspecified: Secondary | ICD-10-CM | POA: Diagnosis present

## 2020-09-05 DIAGNOSIS — B373 Candidiasis of vulva and vagina: Secondary | ICD-10-CM | POA: Diagnosis present

## 2020-09-05 DIAGNOSIS — E43 Unspecified severe protein-calorie malnutrition: Secondary | ICD-10-CM | POA: Diagnosis present

## 2020-09-05 DIAGNOSIS — E860 Dehydration: Secondary | ICD-10-CM | POA: Diagnosis not present

## 2020-09-05 DIAGNOSIS — K560 Paralytic ileus: Secondary | ICD-10-CM | POA: Diagnosis not present

## 2020-09-05 DIAGNOSIS — R1311 Dysphagia, oral phase: Secondary | ICD-10-CM | POA: Diagnosis present

## 2020-09-05 DIAGNOSIS — Z20822 Contact with and (suspected) exposure to covid-19: Secondary | ICD-10-CM | POA: Diagnosis present

## 2020-09-05 DIAGNOSIS — K5904 Chronic idiopathic constipation: Secondary | ICD-10-CM | POA: Diagnosis not present

## 2020-09-05 DIAGNOSIS — F842 Rett's syndrome: Secondary | ICD-10-CM | POA: Diagnosis present

## 2020-09-05 DIAGNOSIS — K5641 Fecal impaction: Secondary | ICD-10-CM | POA: Diagnosis not present

## 2020-09-05 DIAGNOSIS — Z820 Family history of epilepsy and other diseases of the nervous system: Secondary | ICD-10-CM | POA: Diagnosis not present

## 2020-09-05 DIAGNOSIS — R112 Nausea with vomiting, unspecified: Secondary | ICD-10-CM | POA: Diagnosis not present

## 2020-09-05 DIAGNOSIS — D696 Thrombocytopenia, unspecified: Secondary | ICD-10-CM | POA: Diagnosis present

## 2020-09-05 DIAGNOSIS — R41841 Cognitive communication deficit: Secondary | ICD-10-CM | POA: Diagnosis not present

## 2020-09-05 DIAGNOSIS — R000 Tachycardia, unspecified: Secondary | ICD-10-CM | POA: Diagnosis not present

## 2020-09-05 DIAGNOSIS — J690 Pneumonitis due to inhalation of food and vomit: Secondary | ICD-10-CM | POA: Diagnosis not present

## 2020-09-05 DIAGNOSIS — R140 Abdominal distension (gaseous): Secondary | ICD-10-CM | POA: Diagnosis not present

## 2020-09-05 DIAGNOSIS — E870 Hyperosmolality and hypernatremia: Secondary | ICD-10-CM | POA: Diagnosis not present

## 2020-09-05 DIAGNOSIS — T428X1A Poisoning by antiparkinsonism drugs and other central muscle-tone depressants, accidental (unintentional), initial encounter: Secondary | ICD-10-CM | POA: Diagnosis present

## 2020-09-05 DIAGNOSIS — Z66 Do not resuscitate: Secondary | ICD-10-CM | POA: Diagnosis present

## 2020-09-05 DIAGNOSIS — G40909 Epilepsy, unspecified, not intractable, without status epilepticus: Secondary | ICD-10-CM | POA: Diagnosis present

## 2020-09-05 DIAGNOSIS — E872 Acidosis: Secondary | ICD-10-CM | POA: Diagnosis not present

## 2020-09-05 DIAGNOSIS — F79 Unspecified intellectual disabilities: Secondary | ICD-10-CM | POA: Diagnosis present

## 2020-09-05 MED ORDER — LORAZEPAM 2 MG/ML PO CONC
1.0000 mg | Freq: Two times a day (BID) | ORAL | Status: DC
  Administered 2020-09-05 – 2020-09-24 (×38): 1 mg via ORAL
  Filled 2020-09-05 (×7): qty 1
  Filled 2020-09-05: qty 0.5
  Filled 2020-09-05 (×30): qty 1

## 2020-09-05 NOTE — Progress Notes (Signed)
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  Patient is unable to provide any history due to nonverbal status and cognitive impairment.  Father unable to provide history due to dementia, therefore entire history is obtained by ED and chart review.  Caregiver has been on vacation for several days and family at home have not been able to adequately take care of the patient.  Patient's father reportedly has dementia and brother reportedly has a schizophrenia.  Patient was admitted to the hospital until caretaker returns.   Patient will now be placed in facility.   Assessment & Plan:   Principal Problem:   Failure to thrive in adult Active Problems:   Rett syndrome   Seizure disorder (HCC)   1-Failure to thrive/Rett syndrome with cognitive disability/developmental delay/nonverbal with oral phase dysphagia at baseline, requiring full-time care for dependence on others: -Patient was admitted to the hospital, due to unsafe home environment.  Father has dementia, brother has a schizophrenia.  Caregiver was on vacation. -Change medications to oral  2-History of disorder: No seizure activity this admission. Continue with Ativan, Trileptal, Gabitril  3-Baclofen, unintentional increased dose of baclofen on 5/30 and 5/31: Patient received wrong dose of medication, Baclofen 100 mg two doses. Effect should be out of system by this afternoon.  Patient is alert today.  Plan to resume baclofen home dose tomorrow.   4-Anxiety/depression:  Continue with fluoxetin and ativan.   Candida Vulvovaginitis; on Nystatin.     Nutrition Problem: Severe Malnutrition Etiology:  chronic illness (Rett syndrome)    Signs/Symptoms: severe muscle depletion,severe fat depletion,percent weight loss (11.6% x 6 months) Percent weight loss: 11.6 % (x 6 months)    Interventions: Ensure Enlive (each supplement provides 350kcal and 20 grams of protein)  Estimated body mass index is 13.49 kg/m as calculated from the following:   Height as of this encounter: 4\' 11"  (1.499 m).   Weight as of this encounter: 30.3 kg.   DVT prophylaxis: Lovenox Code Status: DNR Family Communication: No family at bedside Disposition Plan:  Status is: Observation  The patient will require care spanning > 2 midnights and should be moved to inpatient because: IV treatments appropriate due to intensity of illness or inability to take PO  Dispo: The patient is from: Home              Anticipated d/c is to: SNF              Patient currently is not medically stable to d/c.   Difficult to place patient No        Consultants:   none  Procedures:     Antimicrobials:    Subjective: Alert, non verbal.   Objective: Vitals:   09/04/20 1523 09/04/20 1925 09/05/20 0049 09/05/20 0408  BP: 104/70 112/73 102/67 105/66  Pulse: 79 67 65 72  Resp: 17 15 16 16   Temp: 97.6 F (36.4 C) 98.2 F (36.8 C) 98 F (36.7 C) (!) 97.5 F (36.4 C)  TempSrc:  Oral  Oral  SpO2: 97% 93% 94% 99%  Weight:      Height:        Intake/Output Summary (Last 24 hours) at 09/05/2020 0740 Last data  filed at 09/05/2020 0513 Gross per 24 hour  Intake 1079.31 ml  Output --  Net 1079.31 ml   Filed Weights   09/03/20 1613 09/03/20 2240  Weight: 40.8 kg 30.3 kg    Examination:  General exam: thin  Respiratory system: Clear to auscultation. Respiratory effort normal. Cardiovascular system: S1 & S2 heard, RRR. No JVD, murmurs, rubs, gallops or clicks. No pedal edema. Gastrointestinal system: Abdomen is nondistended, soft and nontender. No organomegaly or masses felt. Normal bowel sounds  heard. Central nervous system: Alert, non Verbal. Contracted.    Data Reviewed: I have personally reviewed following labs and imaging studies  CBC: Recent Labs  Lab 09/03/20 1645  WBC 13.8*  NEUTROABS 12.2*  HGB 13.7  HCT 42.4  MCV 92.0  PLT 134*   Basic Metabolic Panel: Recent Labs  Lab 09/03/20 1645 09/04/20 0351  NA 138 137  K 3.9 3.9  CL 104 107  CO2 24 24  GLUCOSE 152* 79  BUN 17 12  CREATININE 0.61 0.65  CALCIUM 9.2 8.3*  MG  --  1.9  PHOS  --  4.0   GFR: Estimated Creatinine Clearance: 42 mL/min (by C-G formula based on SCr of 0.65 mg/dL). Liver Function Tests: Recent Labs  Lab 09/03/20 1645 09/04/20 0351  AST 52* 34  ALT 34 28  ALKPHOS 50 34*  BILITOT 0.4 0.6  PROT 7.4 5.9*  ALBUMIN 4.0 3.1*   No results for input(s): LIPASE, AMYLASE in the last 168 hours. No results for input(s): AMMONIA in the last 168 hours. Coagulation Profile: No results for input(s): INR, PROTIME in the last 168 hours. Cardiac Enzymes: No results for input(s): CKTOTAL, CKMB, CKMBINDEX, TROPONINI in the last 168 hours. BNP (last 3 results) No results for input(s): PROBNP in the last 8760 hours. HbA1C: No results for input(s): HGBA1C in the last 72 hours. CBG: No results for input(s): GLUCAP in the last 168 hours. Lipid Profile: No results for input(s): CHOL, HDL, LDLCALC, TRIG, CHOLHDL, LDLDIRECT in the last 72 hours. Thyroid Function Tests: No results for input(s): TSH, T4TOTAL, FREET4, T3FREE, THYROIDAB in the last 72 hours. Anemia Panel: No results for input(s): VITAMINB12, FOLATE, FERRITIN, TIBC, IRON, RETICCTPCT in the last 72 hours. Sepsis Labs: No results for input(s): PROCALCITON, LATICACIDVEN in the last 168 hours.  Recent Results (from the past 240 hour(s))  Resp Panel by RT-PCR (Flu A&B, Covid) Nasopharyngeal Swab     Status: None   Collection Time: 09/03/20  4:45 PM   Specimen: Nasopharyngeal Swab; Nasopharyngeal(NP) swabs in vial transport medium   Result Value Ref Range Status   SARS Coronavirus 2 by RT PCR NEGATIVE NEGATIVE Final    Comment: (NOTE) SARS-CoV-2 target nucleic acids are NOT DETECTED.  The SARS-CoV-2 RNA is generally detectable in upper respiratory specimens during the acute phase of infection. The lowest concentration of SARS-CoV-2 viral copies this assay can detect is 138 copies/mL. A negative result does not preclude SARS-Cov-2 infection and should not be used as the sole basis for treatment or other patient management decisions. A negative result may occur with  improper specimen collection/handling, submission of specimen other than nasopharyngeal swab, presence of viral mutation(s) within the areas targeted by this assay, and inadequate number of viral copies(<138 copies/mL). A negative result must be combined with clinical observations, patient history, and epidemiological information. The expected result is Negative.  Fact Sheet for Patients:  BloggerCourse.com  Fact Sheet for Healthcare Providers:  SeriousBroker.it  This test is no t yet approved or  cleared by the [place] and  has been authorized for detection and/or diagnosis of SARS-CoV-2 by FDA under an Emergency Use Authorization (EUA). This EUA will remain  in effect (meaning this test can be used) for the duration of the COVID-19 declaration under Section 564(b)(1) of the Act, 21 U.S.C.section 360bbb-3(b)(1), unless the authorization is terminated  or revoked sooner.       Influenza A by PCR NEGATIVE NEGATIVE Final   Influenza B by PCR NEGATIVE NEGATIVE Final    Comment: (NOTE) The Xpert Xpress SARS-CoV-2/FLU/RSV plus assay is intended as an aid in the diagnosis of influenza from Nasopharyngeal swab specimens and should not be used as a sole basis for treatment. Nasal washings and aspirates are unacceptable for Xpert Xpress SARS-CoV-2/FLU/RSV testing.  Fact Sheet for  Patients: BloggerCourse.com  Fact Sheet for Healthcare Providers: SeriousBroker.it  This test is not yet approved or cleared by the [place] FDA and has been authorized for detection and/or diagnosis of SARS-CoV-2 by FDA under an Emergency Use Authorization (EUA). This EUA will remain in effect (meaning this test can be used) for the duration of the COVID-19 declaration under Section 564(b)(1) of the Act, 21 U.S.C. section 360bbb-3(b)(1), unless the authorization is terminated or revoked.  Performed at [facility], [address] [place], [place] [address]          Radiology Studies: No results found.      Scheduled Meds: . enoxaparin (LOVENOX) injection  30 mg Subcutaneous Q24H  . feeding supplement  237 mL Oral TID BM  . FLUoxetine  10 mg Oral Daily  . LORazepam  1 mg Intravenous BID  . nystatin cream   Topical BID  . OXcarbazepine  600 mg Oral BID  . tiaGABine  4 mg Oral BID   Continuous Infusions: . dextrose 5 % and 0.45% NaCl 75 mL/hr at 09/05/20 0513     LOS: 0 days    Time spent: 35 minutes.     Alba Cory, MD [facility]   If 7PM-7AM, please contact night-coverage [email or web address]  09/05/2020, 7:40 AM

## 2020-09-05 NOTE — NC FL2 (Signed)
[place] MEDICAID FL2 LEVEL OF CARE SCREENING TOOL     IDENTIFICATION  Patient Name: Alicia Duncan Birthdate: Feb 15, 1976 Sex: female Admission Date (Current Location): 09/03/2020  [place] and [place] Number:  Chiropodist and Address:  [facility], [address] [place], [place] [address]      Provider Number: [id number]  Attending Physician Name and Address:  Alba Cory, MD  Relative Name and Phone Number:  Arvie Villarruel (brother and legal guardian) [phone]    Current Level of Care: Hospital Recommended Level of Care: Skilled Nursing Facility Prior Approval Number:    Date Approved/Denied:   PASRR Number: pending  Discharge Plan: SNF    Current Diagnoses: Patient Active Problem List   Diagnosis Date Noted  . Failure to thrive in adult 09/03/2020  . Goals of care, counseling/discussion   . Palliative care by specialist   . DNR (do not resuscitate)   . Seizures (HCC) 02/13/2020  . Protein-calorie malnutrition, severe 12/20/2019  . Breakthrough seizure (HCC) 12/18/2019  . Cough 05/13/2016  . Pancytopenia (HCC) 05/13/2016  . Iron deficiency anemia 05/13/2016  . SIRS (systemic inflammatory response syndrome) (HCC) 05/11/2016  . Dental infection 05/11/2016  . 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 Seizure disorder (HCC) 05/11/2016  . Sepsis (HCC) 05/11/2016  . Seizure (HCC) 04/25/2013    Orientation RESPIRATION BLADDER Height & Weight        Normal Incontinent Weight: 30.3 kg Height:  4\' 11"  (149.9 cm)  BEHAVIORAL SYMPTOMS/MOOD NEUROLOGICAL BOWEL NUTRITION STATUS      Incontinent Diet (Dysphagia diet 1)  AMBULATORY STATUS COMMUNICATION OF NEEDS Skin   Total Care Does not communicate Normal                       Personal Care Assistance Level of Assistance  Total care       Total Care Assistance: Maximum assistance   Functional Limitations Info             SPECIAL CARE FACTORS FREQUENCY                        Contractures Contractures Info: Present (arms, legs, feet)    Additional Factors Info  Code Status,Allergies Code Status Info: DNR Allergies Info: Benadryl, PCN           Current Medications (09/05/2020):  This is the current hospital active medication list Current Facility-Administered Medications  Medication Dose Route Frequency Provider Last Rate Last Admin  . acetaminophen (TYLENOL) tablet 650 mg  650 mg Oral Q6H PRN 11/05/2020, MD       Or  . acetaminophen (TYLENOL) suppository 650 mg  650 mg Rectal Q6H PRN Charlsie Quest R, MD      . dextrose 5 %-0.45 % sodium chloride infusion   Intravenous Continuous Darreld Mclean, MD 75 mL/hr at 09/05/20 0513 Infusion Verify at 09/05/20 0513  . enoxaparin (LOVENOX) injection 30 mg  30 mg Subcutaneous Q24H 11/05/20 R, MD   30 mg at 09/04/20 2051  . feeding supplement (ENSURE ENLIVE / ENSURE PLUS) liquid 237 mL  237 mL Oral TID BM 2052, MD   237 mL at 09/05/20 1059  . FLUoxetine (PROZAC) 20 MG/5ML solution 10 mg  10 mg Oral Daily 11/05/20, MD   10 mg at 09/05/20 1058  . LORazepam (ATIVAN) injection 1 mg  1 mg Intravenous BID 11/05/20, MD   1 mg at  09/05/20 1058  . nystatin cream (MYCOSTATIN)   Topical BID Charlsie Quest, MD   Given at 09/05/20 1058  . ondansetron (ZOFRAN) tablet 4 mg  4 mg Oral Q6H PRN Charlsie Quest, MD       Or  . ondansetron (ZOFRAN) injection 4 mg  4 mg Intravenous Q6H PRN Darreld Mclean R, MD      . OXcarbazepine (TRILEPTAL) 300 MG/5ML suspension 600 mg  600 mg Oral BID Gilles Chiquito, MD   600 mg at 09/05/20 1058  . senna-docusate (Senokot-S) tablet 1 tablet  1 tablet Oral QHS PRN Darreld Mclean R, MD      . tiaGABine (GABITRIL) tablet 4 mg  4 mg Oral BID Otelia Sergeant, RPH   4 mg at 09/05/20 1058     Discharge Medications: Please see discharge summary for a list of discharge medications.  Relevant Imaging Results:  Relevant Lab Results:   Additional  Information SS# [id number]  Allayne Butcher, RN

## 2020-09-05 NOTE — TOC Progression Note (Addendum)
Transition of Care [facility]) - Progression Note    Patient Details  Name: Alicia Duncan MRN: [id number] Date of Birth: November 05, 1975  Transition of Care [facility]) CM/SW Contact  Hetty Ely, RN Phone Number: 09/05/2020, 8:16 AM  Clinical Narrative: Spoke with Robyn Haber, Manager of [facility], [phone] who says Caretaker Sarah hours are Monday-Friday 8am-6pm, then family is responsible for patients care and due to father's progressed Dementia patient is not getting the care needed. Bertell Maria, patients brother is now the legal guardian and he lives in [place]. Ms. Sibyl Parr says she is having staffing issues and unable to provide coverage for caretaker, Maralyn Sago when she is not there. Ms. Sibyl Parr feels that patient is not receiving care needed after Maralyn Sago is not available.  Genelle Bal called this morning asking when patient will be coming home, he also voices need assistance with finding place for patient because of father's progressed dementia and he living in [place] and unable to take care of patient. Genelle Bal says he is not a health care provider and did not know how to change her diaper or care for patient. Fae Pippin agrees that patient should be placed in a facility to receive consistant care needed. Will discuss with my co-worker to start the process.  Ms. Sibyl Parr just informed me that there is a APS open case already, Ladell Heads, [phone].         Expected Discharge Plan and Services                                                 Social Determinants of Health (SDOH) Interventions    Readmission Risk Interventions No flowsheet data found.

## 2020-09-06 MED ORDER — DOCUSATE SODIUM 50 MG/5ML PO LIQD
100.0000 mg | Freq: Two times a day (BID) | ORAL | Status: DC
  Administered 2020-09-06 – 2020-09-24 (×24): 100 mg via ORAL
  Filled 2020-09-06 (×38): qty 10

## 2020-09-06 MED ORDER — SENNOSIDES-DOCUSATE SODIUM 8.6-50 MG PO TABS: 1.0000 | ORAL_TABLET | Freq: Two times a day (BID) | ORAL | Status: DC

## 2020-09-06 MED ORDER — BACLOFEN 1 MG/ML ORAL SUSPENSION
10.0000 mg | Freq: Three times a day (TID) | ORAL | Status: DC
  Administered 2020-09-06 – 2020-09-07 (×3): 10 mg via ORAL
  Filled 2020-09-06 (×5): qty 10

## 2020-09-06 NOTE — Progress Notes (Signed)
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  Patient is unable to provide any history due to nonverbal status and cognitive impairment.  Father unable to provide history due to dementia, therefore entire history is obtained by ED and chart review.  Caregiver has been on vacation for several days and family at home have not been able to adequately take care of the patient.  Patient's father reportedly has dementia and brother reportedly has a schizophrenia.  Patient was admitted to the hospital until caretaker returns.   Patient will now be placed in facility.   Assessment & Plan:   Principal Problem:   Failure to thrive in adult Active Problems:   Rett syndrome   Seizure disorder (HCC)   1-Failure to thrive/Rett syndrome with cognitive disability/developmental delay/nonverbal with oral phase dysphagia at baseline, requiring full-time care for dependence on others: -Patient was admitted to the hospital, due to unsafe home environment.  Father has dementia, brother has a schizophrenia.  Caregiver was on vacation. -Change medications to oral -Stable, awaiting placement.   2-History of disorder: No seizure activity this admission. Continue with Ativan, Trileptal, Gabitril  3-Baclofen, unintentional increased dose of baclofen on 5/30 and 5/31: Patient received wrong dose of medication, Baclofen 100 mg two doses. Effect should be out of system by this afternoon.  Plan to resume baclofen home dose today.   4-Anxiety/depression:  Continue with fluoxetin and ativan.   Candida Vulvovaginitis; on Nystatin.     Nutrition Problem: Severe Malnutrition Etiology:  chronic illness (Rett syndrome)    Signs/Symptoms: severe muscle depletion,severe fat depletion,percent weight loss (11.6% x 6 months) Percent weight loss: 11.6 % (x 6 months)    Interventions: Ensure Enlive (each supplement provides 350kcal and 20 grams of protein)  Estimated body mass index is 13.49 kg/m as calculated from the following:   Height as of this encounter: 4\' 11"  (1.499 m).   Weight as of this encounter: 30.3 kg.   DVT prophylaxis: Lovenox Code Status: DNR Family Communication: No family at bedside. Try calling brother.  Disposition Plan:  Status is: Observation  The patient will require care spanning > 2 midnights and should be moved to inpatient because: IV treatments appropriate due to intensity of illness or inability to take PO  Dispo: The patient is from: Home              Anticipated d/c is to: SNF              Patient currently is medically stable to d/c. awaiting placement.    Difficult to place patient No        Consultants:   none  Procedures:     Antimicrobials:    Subjective: Alert, non verbal. Appear comfortable.   Objective: Vitals:   09/06/20 0405 09/06/20 0729 09/06/20 1100 09/06/20 1531  BP: 101/65 (!) 88/60 96/65 105/65  Pulse: 83 80 81 84  Resp: 16 16 16 16   Temp: (!) 97.4 F (36.3 C) 97.9 F (36.6 C) 97.6 F (36.4 C) 98.2 F (36.8 C)  TempSrc: Oral  Oral   SpO2: (!) 83% 90% 91%   Weight:      Height:        Intake/Output  Summary (Last 24 hours) at 09/06/2020 1605 Last data filed at 09/06/2020 0900 Gross per 24 hour  Intake 260 ml  Output --  Net 260 ml   Filed Weights   09/03/20 1613 09/03/20 2240  Weight: 40.8 kg 30.3 kg    Examination:  General exam: Thin Respiratory system: CTA Cardiovascular system: S 1, S 2 RRR Gastrointestinal system: BS present, soft, nt Central nervous system: alert, non verbal.    Data Reviewed: I have personally reviewed following labs and imaging studies  CBC: Recent  Labs  Lab 09/03/20 1645  WBC 13.8*  NEUTROABS 12.2*  HGB 13.7  HCT 42.4  MCV 92.0  PLT 134*   Basic Metabolic Panel: Recent Labs  Lab 09/03/20 1645 09/04/20 0351  NA 138 137  K 3.9 3.9  CL 104 107  CO2 24 24  GLUCOSE 152* 79  BUN 17 12  CREATININE 0.61 0.65  CALCIUM 9.2 8.3*  MG  --  1.9  PHOS  --  4.0   GFR: Estimated Creatinine Clearance: 42 mL/min (by C-G formula based on SCr of 0.65 mg/dL). Liver Function Tests: Recent Labs  Lab 09/03/20 1645 09/04/20 0351  AST 52* 34  ALT 34 28  ALKPHOS 50 34*  BILITOT 0.4 0.6  PROT 7.4 5.9*  ALBUMIN 4.0 3.1*   No results for input(s): LIPASE, AMYLASE in the last 168 hours. No results for input(s): AMMONIA in the last 168 hours. Coagulation Profile: No results for input(s): INR, PROTIME in the last 168 hours. Cardiac Enzymes: No results for input(s): CKTOTAL, CKMB, CKMBINDEX, TROPONINI in the last 168 hours. BNP (last 3 results) No results for input(s): PROBNP in the last 8760 hours. HbA1C: No results for input(s): HGBA1C in the last 72 hours. CBG: No results for input(s): GLUCAP in the last 168 hours. Lipid Profile: No results for input(s): CHOL, HDL, LDLCALC, TRIG, CHOLHDL, LDLDIRECT in the last 72 hours. Thyroid Function Tests: No results for input(s): TSH, T4TOTAL, FREET4, T3FREE, THYROIDAB in the last 72 hours. Anemia Panel: No results for input(s): VITAMINB12, FOLATE, FERRITIN, TIBC, IRON, RETICCTPCT in the last 72 hours. Sepsis Labs: No results for input(s): PROCALCITON, LATICACIDVEN in the last 168 hours.  Recent Results (from the past 240 hour(s))  Resp Panel by RT-PCR (Flu A&B, Covid) Nasopharyngeal Swab     Status: None   Collection Time: 09/03/20  4:45 PM   Specimen: Nasopharyngeal Swab; Nasopharyngeal(NP) swabs in vial transport medium  Result Value Ref Range Status   SARS Coronavirus 2 by RT PCR NEGATIVE NEGATIVE Final    Comment: (NOTE) SARS-CoV-2 target nucleic acids are NOT DETECTED.  The  SARS-CoV-2 RNA is generally detectable in upper respiratory specimens during the acute phase of infection. The lowest concentration of SARS-CoV-2 viral copies this assay can detect is 138 copies/mL. A negative result does not preclude SARS-Cov-2 infection and should not be used as the sole basis for treatment or other patient management decisions. A negative result may occur with  improper specimen collection/handling, submission of specimen other than nasopharyngeal swab, presence of viral mutation(s) within the areas targeted by this assay, and inadequate number of viral copies(<138 copies/mL). A negative result must be combined with clinical observations, patient history, and epidemiological information. The expected result is Negative.  Fact Sheet for Patients:  BloggerCourse.com  Fact Sheet for Healthcare Providers:  SeriousBroker.it  This test is no t yet approved or cleared by the [place] FDA and  has been authorized for detection and/or diagnosis of SARS-CoV-2 by FDA  under an Emergency Use Authorization (EUA). This EUA will remain  in effect (meaning this test can be used) for the duration of the COVID-19 declaration under Section 564(b)(1) of the Act, 21 U.S.C.section 360bbb-3(b)(1), unless the authorization is terminated  or revoked sooner.       Influenza A by PCR NEGATIVE NEGATIVE Final   Influenza B by PCR NEGATIVE NEGATIVE Final    Comment: (NOTE) The Xpert Xpress SARS-CoV-2/FLU/RSV plus assay is intended as an aid in the diagnosis of influenza from Nasopharyngeal swab specimens and should not be used as a sole basis for treatment. Nasal washings and aspirates are unacceptable for Xpert Xpress SARS-CoV-2/FLU/RSV testing.  Fact Sheet for Patients: BloggerCourse.com  Fact Sheet for Healthcare Providers: SeriousBroker.it  This test is not yet approved or  cleared by the [place] FDA and has been authorized for detection and/or diagnosis of SARS-CoV-2 by FDA under an Emergency Use Authorization (EUA). This EUA will remain in effect (meaning this test can be used) for the duration of the COVID-19 declaration under Section 564(b)(1) of the Act, 21 U.S.C. section 360bbb-3(b)(1), unless the authorization is terminated or revoked.  Performed at [facility], [address] [place], [place] [address]          Radiology Studies: No results found.      Scheduled Meds: . baclofen  10 mg Oral TID  . docusate  100 mg Oral BID  . enoxaparin (LOVENOX) injection  30 mg Subcutaneous Q24H  . feeding supplement  237 mL Oral TID BM  . FLUoxetine  10 mg Oral Daily  . LORazepam  1 mg Oral BID  . nystatin cream   Topical BID  . OXcarbazepine  600 mg Oral BID  . tiaGABine  4 mg Oral BID   Continuous Infusions: . dextrose 5 % and 0.45% NaCl 75 mL/hr at 09/05/20 0513     LOS: 1 day    Time spent: 35 minutes.     Alba Cory, MD [facility]   If 7PM-7AM, please contact night-coverage [email or web address]  09/06/2020, 4:05 PM

## 2020-09-06 NOTE — TOC Progression Note (Signed)
Transition of Care [facility]) - Progression Note    Patient Details  Name: Alicia Duncan MRN: [id number] Date of Birth: 02/03/1976  Transition of Care [facility]) CM/SW Contact  Allayne Butcher, RN Phone Number: 09/06/2020, 1:37 PM  Clinical Narrative:    RNCM reached back out to [facility] today to review bed offers.  [facility] and [facility] [place] have both offered a bed.  Genelle Bal will look up both facilities and get back to [facility] with preference.  Patient's Passr went to level 2 and is still pending.    Expected Discharge Plan: Skilled Nursing Facility Barriers to Discharge: Continued Medical Work up  Expected Discharge Plan and Services Expected Discharge Plan: Skilled Nursing Facility   Discharge Planning Services: CM Consult Post Acute Care Choice: Nursing Home,Skilled Nursing Facility Living arrangements for the past 2 months: Single Family Home                 DME Arranged: N/A DME Agency: NA         HH Agency: NA         Social Determinants of Health (SDOH) Interventions    Readmission Risk Interventions No flowsheet data found.

## 2020-09-06 NOTE — TOC Initial Note (Signed)
Transition of Care [facility]) - Initial/Assessment Note    Patient Details  Name: Alicia Duncan MRN: [id number] Date of Birth: Oct 22, 1975  Transition of Care [name]) CM/SW Contact:    Allayne Butcher, RN Phone Number: 09/06/2020, 1:34 PM  Clinical Narrative:                 Patient admitted to the hospital with failure to thrive.  Patient has Rett Syndrome and is total care at home.  Patient is non verbal and bed bound and has been being fed from a baby bottle.  Patient has had caregivers at home but the company providing services is unable to support the patient's needs.  Brother  Alicia Duncan) agrees that patient should be placed under long term care at a skilled facility.   Bed search started, passr pending.  Expected Discharge Plan: Skilled Nursing Facility Barriers to Discharge: Continued Medical Work up   Patient Goals and CMS Choice Patient states their goals for this hospitalization and ongoing recovery are:: patient unable to voice goals- brother would like for patient to be placed in long term care CMS Medicare.gov Compare Post Acute Care list provided to:: Legal Guardian Choice offered to / list presented to : [facility] POA / Guardian,Sibling  Expected Discharge Plan and Services Expected Discharge Plan: Skilled Nursing Facility   Discharge Planning Services: CM Consult Post Acute Care Choice: Nursing Home,Skilled Nursing Facility Living arrangements for the past 2 months: Single Family Home                 DME Arranged: N/A DME Agency: NA         HH Agency: NA        Prior Living Arrangements/Services Living arrangements for the past 2 months: Single Family Home Lives with:: Relatives Patient language and need for interpreter reviewed:: Yes Do you feel safe going back to the place where you live?: No   caregiver not available 24/7  Need for Family Participation in Patient Care: Yes (Comment) (Rett Syndrome) Care giver support system in place?: Yes (comment) (brother) Current  home services: Homehealth aide Criminal Activity/Legal Involvement Pertinent to Current Situation/Hospitalization: No - Comment as needed  Activities of Daily Living Home Assistive Devices/Equipment: Other (Comment) (Unsure, seen with home health at home) ADL Screening (condition at time of admission) Patient's cognitive ability adequate to safely complete daily activities?: No Is the patient deaf or have difficulty hearing?: No Does the patient have difficulty seeing, even when wearing glasses/contacts?: No Does the patient have difficulty concentrating, remembering, or making decisions?: Yes Patient able to express need for assistance with ADLs?: No Does the patient have difficulty dressing or bathing?: Yes Independently performs ADLs?: No Communication: Dependent Is this a change from baseline?: Pre-admission baseline Dressing (OT): Dependent Is this a change from baseline?: Pre-admission baseline Grooming: Dependent Is this a change from baseline?: Pre-admission baseline Feeding: Dependent Is this a change from baseline?: Pre-admission baseline Bathing: Dependent Is this a change from baseline?: Pre-admission baseline Toileting: Dependent Is this a change from baseline?: Pre-admission baseline In/Out Bed: Dependent Is this a change from baseline?: Pre-admission baseline Walks in Home: Dependent Is this a change from baseline?: Pre-admission baseline Does the patient have difficulty walking or climbing stairs?: Yes Weakness of Legs: Both Weakness of Arms/Hands: Both  Permission Sought/Granted Permission sought to share information with : Case Manager,Facility Contact Representative,Guardian Permission granted to share information with : Yes, Verbal Permission Granted  Share Information with NAME: Safiyah Cisney  Permission granted to share  info w AGENCY: SNF's  Permission granted to share info w Relationship: Brother (legal guardian)     Emotional Assessment Appearance::  Appears older than stated age Attitude/Demeanor/Rapport: Unable to Assess Affect (typically observed): Unable to Assess   Alcohol / Substance Use: Not Applicable Psych Involvement: No (comment)  Admission diagnosis:  Vaginal candidiasis [B37.3] Failure to thrive in adult [R62.7] Nonverbal [R47.01] Requires assistance with activities of daily living (ADL) [Z74.1] Patient Active Problem List   Diagnosis Date Noted  . Failure to thrive in adult 09/03/2020  . Goals of care, counseling/discussion   . Palliative care by specialist   . DNR (do not resuscitate)   . Seizures (HCC) 02/13/2020  . Protein-calorie malnutrition, severe 12/20/2019  . Breakthrough seizure (HCC) 12/18/2019  . Cough 05/13/2016  . Pancytopenia (HCC) 05/13/2016  . Iron deficiency anemia 05/13/2016  . SIRS (systemic inflammatory response syndrome) (HCC) 05/11/2016  . Dental infection 05/11/2016  . 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 Seizure disorder (HCC) 05/11/2016  . Sepsis (HCC) 05/11/2016  . Seizure (HCC) 04/25/2013   PCP:  [name], MD Pharmacy:   [facility] [phone], [facility] - [address] [place] [address] Phone: [phone] Fax: [phone]     Social Determinants of Health (SDOH) Interventions    Readmission Risk Interventions No flowsheet data found.

## 2020-09-07 LAB — SARS CORONAVIRUS 2 (TAT 6-24 HRS): SARS Coronavirus 2: NEGATIVE

## 2020-09-07 NOTE — Care Management Important Message (Signed)
Important Message  Patient Details  Name: Alicia Duncan MRN: [id number] Date of Birth: [birth year]   Medicare Important Message Given:  N/A - LOS <3 / Initial given by admissions     [name] A Mikaelyn Arthurs 09/07/2020, 7:22 AM

## 2020-09-07 NOTE — TOC Progression Note (Signed)
Transition of Care [facility]) - Progression Note    Patient Details  Name: Alicia Duncan MRN: [id number] Date of Birth: Sep 10, 1975  Transition of Care [facility]) CM/SW Contact  Allayne Butcher, RN Phone Number: 09/07/2020, 2:51 PM  Clinical Narrative:    RNCM spoke with patient's brother (legal guardian) about bed offers.  Only 2 bed offers one from [facility] and one from [facility].  Brother, Genelle Bal chooses [facility].  [facility] can accept, Passr is still pending.  Passr needs to come back before they can admit.  RNCM will follow up with Malena Peer from [facility] early next week.     Expected Discharge Plan: Skilled Nursing Facility Barriers to Discharge: Unsafe home situation,Awaiting State Approval (PASRR)  Expected Discharge Plan and Services Expected Discharge Plan: Skilled Nursing Facility   Discharge Planning Services: CM Consult Post Acute Care Choice: Nursing Home,Skilled Nursing Facility Living arrangements for the past 2 months: Single Family Home                 DME Arranged: N/A DME Agency: NA         HH Agency: NA         Social Determinants of Health (SDOH) Interventions    Readmission Risk Interventions No flowsheet data found.

## 2020-09-07 NOTE — Progress Notes (Signed)
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  Patient is unable to provide any history due to nonverbal status and cognitive impairment.  Father unable to provide history due to dementia, therefore entire history is obtained by ED and chart review.  Caregiver has been on vacation for several days and family at home have not been able to adequately take care of the patient.  Patient's father reportedly has dementia and brother reportedly has a schizophrenia.  Patient was admitted to the hospital until caretaker returns.   Patient will now be placed in facility.   Assessment & Plan:   Principal Problem:   Failure to thrive in adult Active Problems:   Rett syndrome   Seizure disorder (HCC)   1-Failure to thrive/Rett syndrome with cognitive disability/developmental delay/nonverbal with oral phase dysphagia at baseline, requiring full-time care for dependence on others: -Patient was admitted to the hospital, due to unsafe home environment.  Father has dementia, brother has a schizophrenia.  Caregiver was on vacation. Plan is for placement.  -On oral medications.  -Stable, awaiting placement.   2-History of disorder: No seizure activity this admission. Continue with Ativan, Trileptal, Gabitril.  3-Baclofen, unintentional increased dose of baclofen on 5/30 and 5/31: Patient received wrong dose of medication, Baclofen 100 mg two doses. No significant adverse effect from it.  -I was informed by Pharmacist, that patient has received again the wrong dose of Baclofen( 6/03), 100 mg this am. I have discussed with staff to monitor mental status and Vitals. Patient has  remain alert. Family has been informed.  Will plan to hold further doses of baclofen today.   4-Anxiety/depression:  Continue with fluoxetin and ativan.   Candida Vulvovaginitis; on Nystatin.     Nutrition Problem: Severe Malnutrition Etiology: chronic illness (Rett syndrome)    Signs/Symptoms: severe muscle depletion,severe fat depletion,percent weight loss (11.6% x 6 months) Percent weight loss: 11.6 % (x 6 months)    Interventions: Ensure Enlive (each supplement provides 350kcal and 20 grams of protein)  Estimated body mass index is 13.49 kg/m as calculated from the following:   Height as of this encounter: 4\' 11"  (1.499 m).   Weight as of this encounter: 30.3 kg.   DVT prophylaxis: Lovenox Code Status: DNR Family Communication: Brother updated.  Disposition Plan:  Status is: Observation  The patient will require care spanning > 2 midnights and should be moved to inpatient because: IV treatments appropriate due to intensity of illness or inability to take PO  Dispo: The patient is from: Home              Anticipated d/c is to: SNF              Patient currently is medically stable to d/c. awaiting placement.    Difficult to place patient No        Consultants:   none  Procedures:     Antimicrobials:    Subjective: She is alert, non verbal at baseline.   Objective: Vitals:   09/07/20 0106 09/07/20 0539 09/07/20 0748 09/07/20 1245  BP: (!) 97/57 (!) 92/59 (!) 96/51 106/72  Pulse: 85 74 74 83  Resp: 18 18 14 15   Temp: (!) 97.5  F (36.4 C) (!) 97.5 F (36.4 C) 97.7 F (36.5 C) 98.3 F (36.8 C)  TempSrc: Oral Oral Oral   SpO2: 96% 95% 97% 100%  Weight:      Height:       No intake or output data in the 24 hours ending 09/07/20 1248 Filed Weights   09/03/20 1613 09/03/20 2240  Weight: 40.8 kg 30.3 kg    Examination:  General exam: Thin, NAD, Alert Respiratory system: CTA Cardiovascular system: S 1,, S 2 RRR Gastrointestinal  system: BS present, soft, nt Central nervous system: Alert, non verbal.    Data Reviewed: I have personally reviewed following labs and imaging studies  CBC: Recent Labs  Lab 09/03/20 1645  WBC 13.8*  NEUTROABS 12.2*  HGB 13.7  HCT 42.4  MCV 92.0  PLT 134*   Basic Metabolic Panel: Recent Labs  Lab 09/03/20 1645 09/04/20 0351  NA 138 137  K 3.9 3.9  CL 104 107  CO2 24 24  GLUCOSE 152* 79  BUN 17 12  CREATININE 0.61 0.65  CALCIUM 9.2 8.3*  MG  --  1.9  PHOS  --  4.0   GFR: Estimated Creatinine Clearance: 42 mL/min (by C-G formula based on SCr of 0.65 mg/dL). Liver Function Tests: Recent Labs  Lab 09/03/20 1645 09/04/20 0351  AST 52* 34  ALT 34 28  ALKPHOS 50 34*  BILITOT 0.4 0.6  PROT 7.4 5.9*  ALBUMIN 4.0 3.1*   No results for input(s): LIPASE, AMYLASE in the last 168 hours. No results for input(s): AMMONIA in the last 168 hours. Coagulation Profile: No results for input(s): INR, PROTIME in the last 168 hours. Cardiac Enzymes: No results for input(s): CKTOTAL, CKMB, CKMBINDEX, TROPONINI in the last 168 hours. BNP (last 3 results) No results for input(s): PROBNP in the last 8760 hours. HbA1C: No results for input(s): HGBA1C in the last 72 hours. CBG: No results for input(s): GLUCAP in the last 168 hours. Lipid Profile: No results for input(s): CHOL, HDL, LDLCALC, TRIG, CHOLHDL, LDLDIRECT in the last 72 hours. Thyroid Function Tests: No results for input(s): TSH, T4TOTAL, FREET4, T3FREE, THYROIDAB in the last 72 hours. Anemia Panel: No results for input(s): VITAMINB12, FOLATE, FERRITIN, TIBC, IRON, RETICCTPCT in the last 72 hours. Sepsis Labs: No results for input(s): PROCALCITON, LATICACIDVEN in the last 168 hours.  Recent Results (from the past 240 hour(s))  Resp Panel by RT-PCR (Flu A&B, Covid) Nasopharyngeal Swab     Status: None   Collection Time: 09/03/20  4:45 PM   Specimen: Nasopharyngeal Swab; Nasopharyngeal(NP) swabs in vial transport  medium  Result Value Ref Range Status   SARS Coronavirus 2 by RT PCR NEGATIVE NEGATIVE Final    Comment: (NOTE) SARS-CoV-2 target nucleic acids are NOT DETECTED.  The SARS-CoV-2 RNA is generally detectable in upper respiratory specimens during the acute phase of infection. The lowest concentration of SARS-CoV-2 viral copies this assay can detect is 138 copies/mL. A negative result does not preclude SARS-Cov-2 infection and should not be used as the sole basis for treatment or other patient management decisions. A negative result may occur with  improper specimen collection/handling, submission of specimen other than nasopharyngeal swab, presence of viral mutation(s) within the areas targeted by this assay, and inadequate number of viral copies(<138 copies/mL). A negative result must be combined with clinical observations, patient history, and epidemiological information. The expected result is Negative.  Fact Sheet for Patients:  BloggerCourse.com  Fact Sheet for Healthcare Providers:  SeriousBroker.it  This  test is no t yet approved or cleared by the [place] and  has been authorized for detection and/or diagnosis of SARS-CoV-2 by FDA under an Emergency Use Authorization (EUA). This EUA will remain  in effect (meaning this test can be used) for the duration of the COVID-19 declaration under Section 564(b)(1) of the Act, 21 U.S.C.section 360bbb-3(b)(1), unless the authorization is terminated  or revoked sooner.       Influenza A by PCR NEGATIVE NEGATIVE Final   Influenza B by PCR NEGATIVE NEGATIVE Final    Comment: (NOTE) The Xpert Xpress SARS-CoV-2/FLU/RSV plus assay is intended as an aid in the diagnosis of influenza from Nasopharyngeal swab specimens and should not be used as a sole basis for treatment. Nasal washings and aspirates are unacceptable for Xpert Xpress SARS-CoV-2/FLU/RSV testing.  Fact Sheet for  Patients: BloggerCourse.com  Fact Sheet for Healthcare Providers: SeriousBroker.it  This test is not yet approved or cleared by the [place] FDA and has been authorized for detection and/or diagnosis of SARS-CoV-2 by FDA under an Emergency Use Authorization (EUA). This EUA will remain in effect (meaning this test can be used) for the duration of the COVID-19 declaration under Section 564(b)(1) of the Act, 21 U.S.C. section 360bbb-3(b)(1), unless the authorization is terminated or revoked.  Performed at [facility], [address] [place], [place] [address]   SARS CORONAVIRUS 2 (TAT 6-24 HRS) Nasopharyngeal Nasopharyngeal Swab     Status: None   Collection Time: 09/06/20  3:00 PM   Specimen: Nasopharyngeal Swab  Result Value Ref Range Status   SARS Coronavirus 2 NEGATIVE NEGATIVE Final    Comment: (NOTE) SARS-CoV-2 target nucleic acids are NOT DETECTED.  The SARS-CoV-2 RNA is generally detectable in upper and lower respiratory specimens during the acute phase of infection. Negative results do not preclude SARS-CoV-2 infection, do not rule out co-infections with other pathogens, and should not be used as the sole basis for treatment or other patient management decisions. Negative results must be combined with clinical observations, patient history, and epidemiological information. The expected result is Negative.  Fact Sheet for Patients: HairSlick.no  Fact Sheet for Healthcare Providers: quierodirigir.com  This test is not yet approved or cleared by the [place] FDA and  has been authorized for detection and/or diagnosis of SARS-CoV-2 by FDA under an Emergency Use Authorization (EUA). This EUA will remain  in effect (meaning this test can be used) for the duration of the COVID-19 declaration under Se ction 564(b)(1) of the Act, 21 U.S.C. section  360bbb-3(b)(1), unless the authorization is terminated or revoked sooner.  Performed at [facility], [address] [place], [place] [address]          Radiology Studies: No results found.      Scheduled Meds: . docusate  100 mg Oral BID  . enoxaparin (LOVENOX) injection  30 mg Subcutaneous Q24H  . feeding supplement  237 mL Oral TID BM  . FLUoxetine  10 mg Oral Daily  . LORazepam  1 mg Oral BID  . nystatin cream   Topical BID  . OXcarbazepine  600 mg Oral BID  . tiaGABine  4 mg Oral BID   Continuous Infusions: . dextrose 5 % and 0.45% NaCl 75 mL/hr at 09/05/20 0513     LOS: 2 days    Time spent: 35 minutes.     Alba Cory, MD [facility]   If 7PM-7AM, please contact night-coverage [email or web address]  09/07/2020, 12:48 PM

## 2020-09-08 LAB — BASIC METABOLIC PANEL
Anion gap: 9 (ref 5–15)
BUN: 22 mg/dL — ABNORMAL HIGH (ref 6–20)
CO2: 23 mmol/L (ref 22–32)
Calcium: 8.2 mg/dL — ABNORMAL LOW (ref 8.9–10.3)
Chloride: 100 mmol/L (ref 98–111)
Creatinine, Ser: 0.44 mg/dL (ref 0.44–1.00)
GFR, Estimated: >60 mL/min (ref >60)
Glucose, Bld: 84 mg/dL (ref 70–99)
Potassium: 3.9 mmol/L (ref 3.5–5.1)
Sodium: 132 mmol/L — ABNORMAL LOW (ref 135–145)

## 2020-09-08 MED ORDER — SODIUM CHLORIDE 0.9 % IV SOLN: INTRAVENOUS | Status: DC

## 2020-09-08 NOTE — TOC Progression Note (Signed)
Transition of Care [facility]) - Progression Note    Patient Details  Name: Alicia Duncan MRN: [id number] Date of Birth: March 25, 1976  [facility]) CM/SW Contact  Gildardo Griffes, [place] Phone Number: 09/08/2020, 8:52 AM  Clinical Narrative:     PASARR number still pending, patient to discharge to [facility] once PASARR number is assigned.   TOC continues to follow for discharge planning.   Expected Discharge Plan: Skilled Nursing Facility Barriers to Discharge: Unsafe home situation,Awaiting State Approval (PASRR)  Expected Discharge Plan and Services Expected Discharge Plan: Skilled Nursing Facility   Discharge Planning Services: CM Consult Post Acute Care Choice: Nursing Home,Skilled Nursing Facility Living arrangements for the past 2 months: Single Family Home                 DME Arranged: N/A DME Agency: NA         HH Agency: NA         Social Determinants of Health (SDOH) Interventions    Readmission Risk Interventions No flowsheet data found.

## 2020-09-08 NOTE — Progress Notes (Signed)
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  Patient is unable to provide any history due to nonverbal status and cognitive impairment.  Father unable to provide history due to dementia, therefore entire history is obtained by ED and chart review.  Caregiver has been on vacation for several days and family at home have not been able to adequately take care of the patient.  Patient's father reportedly has dementia and brother reportedly has a schizophrenia.  Patient was admitted to the hospital until caretaker returns.   Patient will now be placed in facility.   Assessment & Plan:   Principal Problem:   Failure to thrive in adult Active Problems:   Rett syndrome   Seizure disorder (HCC)   1-Failure to thrive/Rett syndrome with cognitive disability/developmental delay/nonverbal with oral phase dysphagia at baseline, requiring full-time care for dependence on others: -Patient was admitted to the hospital, due to unsafe home environment.  Father has dementia, brother has a schizophrenia.  Caregiver was on vacation. Plan is for placement.  -On oral medications.  -Stable, awaiting placement.   2-History of disorder: No seizure activity this admission. Continue with Ativan, Trileptal, Gabitril.  3-Baclofen, unintentional increased dose of baclofen on 5/30 and 5/31: Patient received wrong dose of medication, Baclofen 100 mg two doses. No significant adverse effect from it.  -I was informed by Pharmacist, that patient has received again the wrong dose of Baclofen( 6/03), 100 mg this am. I have discussed with staff to monitor mental status and Vitals. Patient has  remain alert. Family has been informed.  -Patient has remain stable. Per nurse report today patient has been sleepy. Plan to hold baclofen today.   4-Anxiety/depression:  Continue with fluoxetin and ativan.   Candida Vulvovaginitis; on Nystatin.     Nutrition Problem: Severe Malnutrition Etiology: chronic illness (Rett syndrome)    Signs/Symptoms: severe muscle depletion,severe fat depletion,percent weight loss (11.6% x 6 months) Percent weight loss: 11.6 % (x 6 months)    Interventions: Ensure Enlive (each supplement provides 350kcal and 20 grams of protein)  Estimated body mass index is 13.49 kg/m as calculated from the following:   Height as of this encounter: 4\' 11"  (1.499 m).   Weight as of this encounter: 30.3 kg.   DVT prophylaxis: Lovenox Code Status: DNR Family Communication: Brother updated.  Disposition Plan:  Status is: Observation  The patient will require care spanning > 2 midnights and should be moved to inpatient because: IV treatments appropriate due to intensity of illness or inability to take PO  Dispo: The patient is from: Home              Anticipated d/c is to: SNF              Patient currently is medically stable to d/c. awaiting placement.    Difficult to place patient No        Consultants:   none  Procedures:     Antimicrobials:    Subjective: She is alert, open eyes, non verbal.   Objective: Vitals:   09/07/20 2326 09/08/20 0349 09/08/20 0800 09/08/20 1154  BP: 95/67 97/61 100/70 98/64  Pulse: 73 67 80 78  Resp: 14 14 14  14  Temp: (!) 97.4 F (36.3 C) (!) 97.3 F (36.3 C) 97.8 F (36.6 C) 97.8 F (36.6 C)  TempSrc: Oral Oral  Axillary  SpO2: 95% 96% 95% 96%  Weight:      Height:       No intake or output data in the 24 hours ending 09/08/20 1603 Filed Weights   09/03/20 1613 09/03/20 2240  Weight: 40.8 kg 30.3 kg    Examination:  General exam: Thin appearing, NAD Respiratory system:  CTA Cardiovascular system: S 1, S 2 RRR Gastrointestinal system: BS present, soft.  Central nervous system: alert, non verbal.     Data Reviewed: I have personally reviewed following labs and imaging studies  CBC: Recent Labs  Lab 09/03/20 1645  WBC 13.8*  NEUTROABS 12.2*  HGB 13.7  HCT 42.4  MCV 92.0  PLT 134*   Basic Metabolic Panel: Recent Labs  Lab 09/03/20 1645 09/04/20 0351 09/08/20 0754  NA 138 137 132*  K 3.9 3.9 3.9  CL 104 107 100  CO2 24 24 23   GLUCOSE 152* 79 84  BUN 17 12 22*  CREATININE 0.61 0.65 0.44  CALCIUM 9.2 8.3* 8.2*  MG  --  1.9  --   PHOS  --  4.0  --    GFR: Estimated Creatinine Clearance: 42 mL/min (by C-G formula based on SCr of 0.44 mg/dL). Liver Function Tests: Recent Labs  Lab 09/03/20 1645 09/04/20 0351  AST 52* 34  ALT 34 28  ALKPHOS 50 34*  BILITOT 0.4 0.6  PROT 7.4 5.9*  ALBUMIN 4.0 3.1*   No results for input(s): LIPASE, AMYLASE in the last 168 hours. No results for input(s): AMMONIA in the last 168 hours. Coagulation Profile: No results for input(s): INR, PROTIME in the last 168 hours. Cardiac Enzymes: No results for input(s): CKTOTAL, CKMB, CKMBINDEX, TROPONINI in the last 168 hours. BNP (last 3 results) No results for input(s): PROBNP in the last 8760 hours. HbA1C: No results for input(s): HGBA1C in the last 72 hours. CBG: No results for input(s): GLUCAP in the last 168 hours. Lipid Profile: No results for input(s): CHOL, HDL, LDLCALC, TRIG, CHOLHDL, LDLDIRECT in the last 72 hours. Thyroid Function Tests: No results for input(s): TSH, T4TOTAL, FREET4, T3FREE, THYROIDAB in the last 72 hours. Anemia Panel: No results for input(s): VITAMINB12, FOLATE, FERRITIN, TIBC, IRON, RETICCTPCT in the last 72 hours. Sepsis Labs: No results for input(s): PROCALCITON, LATICACIDVEN in the last 168 hours.  Recent Results (from the past 240 hour(s))  Resp Panel by RT-PCR (Flu A&B, Covid) Nasopharyngeal Swab     Status: None    Collection Time: 09/03/20  4:45 PM   Specimen: Nasopharyngeal Swab; Nasopharyngeal(NP) swabs in vial transport medium  Result Value Ref Range Status   SARS Coronavirus 2 by RT PCR NEGATIVE NEGATIVE Final    Comment: (NOTE) SARS-CoV-2 target nucleic acids are NOT DETECTED.  The SARS-CoV-2 RNA is generally detectable in upper respiratory specimens during the acute phase of infection. The lowest concentration of SARS-CoV-2 viral copies this assay can detect is 138 copies/mL. A negative result does not preclude SARS-Cov-2 infection and should not be used as the sole basis for treatment or other patient management decisions. A negative result may occur with  improper specimen collection/handling, submission of specimen other than nasopharyngeal swab, presence of viral mutation(s) within the areas targeted by this assay, and inadequate number of viral copies(<138 copies/mL). A negative result must be combined with clinical observations, patient history, and epidemiological information.  The expected result is Negative.  Fact Sheet for Patients:  BloggerCourse.com  Fact Sheet for Healthcare Providers:  SeriousBroker.it  This test is no t yet approved or cleared by the [place] FDA and  has been authorized for detection and/or diagnosis of SARS-CoV-2 by FDA under an Emergency Use Authorization (EUA). This EUA will remain  in effect (meaning this test can be used) for the duration of the COVID-19 declaration under Section 564(b)(1) of the Act, 21 U.S.C.section 360bbb-3(b)(1), unless the authorization is terminated  or revoked sooner.       Influenza A by PCR NEGATIVE NEGATIVE Final   Influenza B by PCR NEGATIVE NEGATIVE Final    Comment: (NOTE) The Xpert Xpress SARS-CoV-2/FLU/RSV plus assay is intended as an aid in the diagnosis of influenza from Nasopharyngeal swab specimens and should not be used as a sole basis for treatment.  Nasal washings and aspirates are unacceptable for Xpert Xpress SARS-CoV-2/FLU/RSV testing.  Fact Sheet for Patients: BloggerCourse.com  Fact Sheet for Healthcare Providers: SeriousBroker.it  This test is not yet approved or cleared by the [place] FDA and has been authorized for detection and/or diagnosis of SARS-CoV-2 by FDA under an Emergency Use Authorization (EUA). This EUA will remain in effect (meaning this test can be used) for the duration of the COVID-19 declaration under Section 564(b)(1) of the Act, 21 U.S.C. section 360bbb-3(b)(1), unless the authorization is terminated or revoked.  Performed at [facility], [address] [place], [place] [address]   SARS CORONAVIRUS 2 (TAT 6-24 HRS) Nasopharyngeal Nasopharyngeal Swab     Status: None   Collection Time: 09/06/20  3:00 PM   Specimen: Nasopharyngeal Swab  Result Value Ref Range Status   SARS Coronavirus 2 NEGATIVE NEGATIVE Final    Comment: (NOTE) SARS-CoV-2 target nucleic acids are NOT DETECTED.  The SARS-CoV-2 RNA is generally detectable in upper and lower respiratory specimens during the acute phase of infection. Negative results do not preclude SARS-CoV-2 infection, do not rule out co-infections with other pathogens, and should not be used as the sole basis for treatment or other patient management decisions. Negative results must be combined with clinical observations, patient history, and epidemiological information. The expected result is Negative.  Fact Sheet for Patients: HairSlick.no  Fact Sheet for Healthcare Providers: quierodirigir.com  This test is not yet approved or cleared by the [place] FDA and  has been authorized for detection and/or diagnosis of SARS-CoV-2 by FDA under an Emergency Use Authorization (EUA). This EUA will remain  in effect (meaning this test can be  used) for the duration of the COVID-19 declaration under Se ction 564(b)(1) of the Act, 21 U.S.C. section 360bbb-3(b)(1), unless the authorization is terminated or revoked sooner.  Performed at [facility], [address] [place], [place] [address]          Radiology Studies: No results found.      Scheduled Meds: . docusate  100 mg Oral BID  . enoxaparin (LOVENOX) injection  30 mg Subcutaneous Q24H  . feeding supplement  237 mL Oral TID BM  . FLUoxetine  10 mg Oral Daily  . LORazepam  1 mg Oral BID  . nystatin cream   Topical BID  . OXcarbazepine  600 mg Oral BID  . tiaGABine  4 mg Oral BID   Continuous Infusions: . sodium chloride       LOS: 3 days    Time spent: 35 minutes.     Alba Cory, MD [facility]   If 7PM-7AM, please contact night-coverage [email or web address]  09/08/2020, 4:03 PM

## 2020-09-09 MED ORDER — BISACODYL 10 MG RE SUPP
10.0000 mg | Freq: Once | RECTAL | Status: AC
  Administered 2020-09-09: 13:00:00 10 mg via RECTAL
  Filled 2020-09-09: qty 1

## 2020-09-09 MED ORDER — BACLOFEN 10 MG PO TABS
10.0000 mg | ORAL_TABLET | Freq: Three times a day (TID) | ORAL | Status: DC
  Administered 2020-09-09 – 2020-09-23 (×44): 10 mg via ORAL
  Filled 2020-09-09 (×49): qty 1

## 2020-09-09 NOTE — Progress Notes (Signed)
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  Patient is unable to provide any history due to nonverbal status and cognitive impairment.  Father unable to provide history due to dementia, therefore entire history is obtained by ED and chart review.  Caregiver has been on vacation for several days and family at home have not been able to adequately take care of the patient.  Patient's father reportedly has dementia and brother reportedly has a schizophrenia.  Patient was admitted to the hospital until caretaker returns.   Patient will now be placed in facility.   Assessment & Plan:   Principal Problem:   Failure to thrive in adult Active Problems:   Rett syndrome   Seizure disorder (HCC)   1-Failure to thrive/Rett syndrome with cognitive disability/developmental delay/nonverbal with oral phase dysphagia at baseline, requiring full-time care for dependence on others: -Patient was admitted to the hospital, due to unsafe home environment.  Father has dementia, brother has a schizophrenia.  Caregiver was on vacation. Plan is for placement.  -On oral medications.  -Stable, awaiting placement.   2-History of disorder: No seizure activity this admission. Continue with Ativan, Trileptal, Gabitril.  3-Baclofen, unintentional increased dose of baclofen on 5/30 and 5/31: Patient received wrong dose of medication, Baclofen 100 mg two doses. No significant adverse effect from it.  -I was informed by Pharmacist, that patient has received again the wrong dose of Baclofen( 6/03), 100 mg this am. I have discussed with staff to monitor mental status and Vitals. Patient has  remain alert. Family has been informed.  -Patient has remain stable. Plan to resume Baclofen, will use pill form.   4-Anxiety/depression:  Continue with fluoxetin and ativan.   Candida Vulvovaginitis; on Nystatin.     Nutrition Problem: Severe Malnutrition Etiology: chronic illness (Rett syndrome)    Signs/Symptoms: severe muscle depletion,severe fat depletion,percent weight loss (11.6% x 6 months) Percent weight loss: 11.6 % (x 6 months)    Interventions: Ensure Enlive (each supplement provides 350kcal and 20 grams of protein)  Estimated body mass index is 13.49 kg/m as calculated from the following:   Height as of this encounter: 4\' 11"  (1.499 m).   Weight as of this encounter: 30.3 kg.   DVT prophylaxis: Lovenox Code Status: DNR Family Communication: Brother updated.  Disposition Plan:  Status is: Observation  The patient will require care spanning > 2 midnights and should be moved to inpatient because: IV treatments appropriate due to intensity of illness or inability to take PO  Dispo: The patient is from: Home              Anticipated d/c is to: SNF              Patient currently is medically stable to d/c. awaiting placement.    Difficult to place patient No        Consultants:   none  Procedures:     Antimicrobials:    Subjective: She is more alert today. Non verbal.   Objective: Vitals:   09/08/20 2109 09/09/20 0400 09/09/20 0722 09/09/20 1126  BP: 115/73 96/64 110/74 117/76  Pulse: 99 98 85 (!) 107  Resp: 16 16 15 18   Temp:  97.7 F (36.5 C) (!) 97.5 F (36.4 C) 99.3 F (37.4 C)  TempSrc: Oral Oral    SpO2: 93% 97% 94% 93%  Weight:      Height:        Intake/Output Summary (Last 24 hours) at 09/09/2020 1221 Last data filed at 09/09/2020 [id number] Gross per 24 hour  Intake 858.9 ml  Output --  Net 858.9 ml   Filed Weights   09/03/20 1613 09/03/20 2240  Weight: 40.8 kg 30.3 kg    Examination:  General exam: NAD,  thin Respiratory system: CTA Cardiovascular system: S 1, S 2 RRR Gastrointestinal system: BS present, soft, nt Central nervous system: Alert, non verbal,     Data Reviewed: I have personally reviewed following labs and imaging studies  CBC: Recent Labs  Lab 09/03/20 1645  WBC 13.8*  NEUTROABS 12.2*  HGB 13.7  HCT 42.4  MCV 92.0  PLT 134*   Basic Metabolic Panel: Recent Labs  Lab 09/03/20 1645 09/04/20 0351 09/08/20 0754  NA 138 137 132*  K 3.9 3.9 3.9  CL 104 107 100  CO2 24 24 23   GLUCOSE 152* 79 84  BUN 17 12 22*  CREATININE 0.61 0.65 0.44  CALCIUM 9.2 8.3* 8.2*  MG  --  1.9  --   PHOS  --  4.0  --    GFR: Estimated Creatinine Clearance: 42 mL/min (by C-G formula based on SCr of 0.44 mg/dL). Liver Function Tests: Recent Labs  Lab 09/03/20 1645 09/04/20 0351  AST 52* 34  ALT 34 28  ALKPHOS 50 34*  BILITOT 0.4 0.6  PROT 7.4 5.9*  ALBUMIN 4.0 3.1*   No results for input(s): LIPASE, AMYLASE in the last 168 hours. No results for input(s): AMMONIA in the last 168 hours. Coagulation Profile: No results for input(s): INR, PROTIME in the last 168 hours. Cardiac Enzymes: No results for input(s): CKTOTAL, CKMB, CKMBINDEX, TROPONINI in the last 168 hours. BNP (last 3 results) No results for input(s): PROBNP in the last 8760 hours. HbA1C: No results for input(s): HGBA1C in the last 72 hours. CBG: No results for input(s): GLUCAP in the last 168 hours. Lipid Profile: No results for input(s): CHOL, HDL, LDLCALC, TRIG, CHOLHDL, LDLDIRECT in the last 72 hours. Thyroid Function Tests: No results for input(s): TSH, T4TOTAL, FREET4, T3FREE, THYROIDAB in the last 72 hours. Anemia Panel: No results for input(s): VITAMINB12, FOLATE, FERRITIN, TIBC, IRON, RETICCTPCT in the last 72 hours. Sepsis Labs: No results for input(s): PROCALCITON, LATICACIDVEN in the last 168 hours.  Recent Results (from the past 240 hour(s))  Resp Panel by RT-PCR (Flu A&B, Covid)  Nasopharyngeal Swab     Status: None   Collection Time: 09/03/20  4:45 PM   Specimen: Nasopharyngeal Swab; Nasopharyngeal(NP) swabs in vial transport medium  Result Value Ref Range Status   SARS Coronavirus 2 by RT PCR NEGATIVE NEGATIVE Final    Comment: (NOTE) SARS-CoV-2 target nucleic acids are NOT DETECTED.  The SARS-CoV-2 RNA is generally detectable in upper respiratory specimens during the acute phase of infection. The lowest concentration of SARS-CoV-2 viral copies this assay can detect is 138 copies/mL. A negative result does not preclude SARS-Cov-2 infection and should not be used as the sole basis for treatment or other patient management decisions. A negative result may occur with  improper specimen collection/handling, submission of specimen other than nasopharyngeal swab, presence of viral mutation(s) within the areas targeted by this assay, and inadequate number of viral copies(<138 copies/mL). A negative result  must be combined with clinical observations, patient history, and epidemiological information. The expected result is Negative.  Fact Sheet for Patients:  BloggerCourse.com  Fact Sheet for Healthcare Providers:  SeriousBroker.it  This test is no t yet approved or cleared by the [place] FDA and  has been authorized for detection and/or diagnosis of SARS-CoV-2 by FDA under an Emergency Use Authorization (EUA). This EUA will remain  in effect (meaning this test can be used) for the duration of the COVID-19 declaration under Section 564(b)(1) of the Act, 21 U.S.C.section 360bbb-3(b)(1), unless the authorization is terminated  or revoked sooner.       Influenza A by PCR NEGATIVE NEGATIVE Final   Influenza B by PCR NEGATIVE NEGATIVE Final    Comment: (NOTE) The Xpert Xpress SARS-CoV-2/FLU/RSV plus assay is intended as an aid in the diagnosis of influenza from Nasopharyngeal swab specimens and should not be  used as a sole basis for treatment. Nasal washings and aspirates are unacceptable for Xpert Xpress SARS-CoV-2/FLU/RSV testing.  Fact Sheet for Patients: BloggerCourse.com  Fact Sheet for Healthcare Providers: SeriousBroker.it  This test is not yet approved or cleared by the [place] FDA and has been authorized for detection and/or diagnosis of SARS-CoV-2 by FDA under an Emergency Use Authorization (EUA). This EUA will remain in effect (meaning this test can be used) for the duration of the COVID-19 declaration under Section 564(b)(1) of the Act, 21 U.S.C. section 360bbb-3(b)(1), unless the authorization is terminated or revoked.  Performed at [facility], [address] [place], [place] [address]   SARS CORONAVIRUS 2 (TAT 6-24 HRS) Nasopharyngeal Nasopharyngeal Swab     Status: None   Collection Time: 09/06/20  3:00 PM   Specimen: Nasopharyngeal Swab  Result Value Ref Range Status   SARS Coronavirus 2 NEGATIVE NEGATIVE Final    Comment: (NOTE) SARS-CoV-2 target nucleic acids are NOT DETECTED.  The SARS-CoV-2 RNA is generally detectable in upper and lower respiratory specimens during the acute phase of infection. Negative results do not preclude SARS-CoV-2 infection, do not rule out co-infections with other pathogens, and should not be used as the sole basis for treatment or other patient management decisions. Negative results must be combined with clinical observations, patient history, and epidemiological information. The expected result is Negative.  Fact Sheet for Patients: HairSlick.no  Fact Sheet for Healthcare Providers: quierodirigir.com  This test is not yet approved or cleared by the [place] FDA and  has been authorized for detection and/or diagnosis of SARS-CoV-2 by FDA under an Emergency Use Authorization (EUA). This EUA will remain  in  effect (meaning this test can be used) for the duration of the COVID-19 declaration under Se ction 564(b)(1) of the Act, 21 U.S.C. section 360bbb-3(b)(1), unless the authorization is terminated or revoked sooner.  Performed at [facility], [address] [place], [place] [address]          Radiology Studies: No results found.      Scheduled Meds: . baclofen  10 mg Oral TID  . docusate  100 mg Oral BID  . enoxaparin (LOVENOX) injection  30 mg Subcutaneous Q24H  . feeding supplement  237 mL Oral TID BM  . FLUoxetine  10 mg Oral Daily  . LORazepam  1 mg Oral BID  . nystatin cream   Topical BID  . OXcarbazepine  600 mg Oral BID  . tiaGABine  4 mg Oral BID   Continuous Infusions: . sodium chloride 50 mL/hr at 09/09/20 0909  LOS: 4 days    Time spent: 35 minutes.     Alba Cory, MD [facility]   If 7PM-7AM, please contact night-coverage [email or web address]  09/09/2020, 12:21 PM

## 2020-09-09 NOTE — Progress Notes (Signed)
Peripheral IV site - pt previously pulled IV site out with NS at 75ml/hr infusing; multiple peripheral IV site attempts by ICU RN and IV Team; received verbal order from Dr Sunnie Nielsen that we may leave IV out due to possible discharge tomorrow

## 2020-09-10 LAB — CBC
HCT: 39.5 % (ref 36.0–46.0)
Hemoglobin: 12.7 g/dL (ref 12.0–15.0)
MCH: 29.7 pg (ref 26.0–34.0)
MCHC: 32.2 g/dL (ref 30.0–36.0)
MCV: 92.3 fL (ref 80.0–100.0)
Platelets: 167 10*3/uL (ref 150–400)
RBC: 4.28 MIL/uL (ref 3.87–5.11)
RDW: 16.6 % — ABNORMAL HIGH (ref 11.5–15.5)
WBC: 11 10*3/uL — ABNORMAL HIGH (ref 4.0–10.5)
nRBC: 0 % (ref 0.0–0.2)

## 2020-09-10 LAB — RESP PANEL BY RT-PCR (FLU A&B, COVID) ARPGX2
Influenza A by PCR: NEGATIVE
Influenza B by PCR: NEGATIVE
SARS Coronavirus 2 by RT PCR: NEGATIVE

## 2020-09-10 LAB — BASIC METABOLIC PANEL
Anion gap: 6 (ref 5–15)
BUN: 16 mg/dL (ref 6–20)
CO2: 25 mmol/L (ref 22–32)
Calcium: 8.4 mg/dL — ABNORMAL LOW (ref 8.9–10.3)
Chloride: 106 mmol/L (ref 98–111)
Creatinine, Ser: 0.42 mg/dL — ABNORMAL LOW (ref 0.44–1.00)
GFR, Estimated: >60 mL/min (ref >60)
Glucose, Bld: 143 mg/dL — ABNORMAL HIGH (ref 70–99)
Potassium: 3.9 mmol/L (ref 3.5–5.1)
Sodium: 137 mmol/L (ref 135–145)

## 2020-09-10 MED ORDER — SODIUM CHLORIDE 0.9 % IV BOLUS
250.0000 mL | Freq: Once | INTRAVENOUS | Status: AC
  Administered 2020-09-10: 16:00:00 250 mL via INTRAVENOUS

## 2020-09-10 MED ORDER — SODIUM CHLORIDE 0.9 % IV SOLN: INTRAVENOUS | Status: DC

## 2020-09-10 NOTE — TOC Progression Note (Signed)
Transition of Care [facility]) - Progression Note    Patient Details  Name: Alicia Duncan MRN: [id number] Date of Birth: 11-24-1975  [facility] CM/SW Contact  Allayne Butcher, RN Phone Number: 09/10/2020, 1:17 PM  Clinical Narrative:    Passr is still pending.  Plan for discharge once Passr is back will be discharge to [facility].    Expected Discharge Plan: Skilled Nursing Facility Barriers to Discharge: Awaiting State Approval Cherlyn Roberts)  Expected Discharge Plan and Services Expected Discharge Plan: Skilled Nursing Facility   Discharge Planning Services: CM Consult Post Acute Care Choice: Nursing Home,Skilled Nursing Facility Living arrangements for the past 2 months: Single Family Home                 DME Arranged: N/A DME Agency: NA         HH Agency: NA         Social Determinants of Health (SDOH) Interventions    Readmission Risk Interventions No flowsheet data found.

## 2020-09-10 NOTE — Care Management Important Message (Signed)
Important Message  Patient Details  Name: Alicia Duncan MRN: [id number] Date of Birth: 11/15/1975   Medicare Important Message Given:  Yes  I talked with the Legal Guardian, Mumtaz Lovins, brother by phone [phone]) and reviewed the patient's Important Message from Medicare.  He is in agreement with the discharge plan and did not need a copy.  I thanked him for his time.  [name] A Brinda Focht 09/10/2020, 3:35 PM

## 2020-09-10 NOTE — Progress Notes (Signed)
   09/10/20 1429  Assess: MEWS Score  Temp 97.9 F (36.6 C)  BP 99/63  Pulse Rate (!) 114  Resp 16  Level of Consciousness Alert  SpO2 93 %  O2 Device Room Air  Assess: MEWS Score  MEWS Temp 0  MEWS Systolic 1  MEWS Pulse 2  MEWS RR 0  MEWS LOC 0  MEWS Score 3  MEWS Score Color Yellow  Assess: if the MEWS score is Yellow or Red  Were vital signs taken at a resting state? Yes  Focused Assessment Change from prior assessment (see assessment flowsheet)  Early Detection of Sepsis Score *See Row Information* Low  MEWS guidelines implemented *See Row Information* Yes  Treat  MEWS Interventions Other (Comment) (messaged MD)  Pain Scale Faces  Faces Pain Scale 0  Take Vital Signs  Increase Vital Sign Frequency  Yellow: Q 2hr X 2 then Q 4hr X 2, if remains yellow, continue Q 4hrs  Escalate  MEWS: Escalate Yellow: discuss with charge nurse/RN and consider discussing with provider and RRT  Notify: Charge Nurse/RN  Name of Charge Nurse/RN Notified Verl Bangs, RN  Date Charge Nurse/RN Notified 09/10/20  Time Charge Nurse/RN Notified 1433  Notify: Provider  Provider Name/Title Dr. Sunnie Nielsen  Date Provider Notified 09/10/20  Time Provider Notified 1433  Notification Type Page  Notification Reason Change in status  Provider response No new orders  Date of Provider Response 09/10/20  Time of Provider Response 1435  Document  Patient Outcome Other (Comment)  Progress note created (see row info) Yes

## 2020-09-10 NOTE — Care Management Important Message (Signed)
Important Message  Patient Details  Name: Alicia Duncan MRN: [id number] Date of Birth: 23-Sep-1975   Medicare Important Message Given:  Other (see comment)  Called patients legal guardian, Hind Chesler, brother [phone]) but call did not go through. Received message to try the call later. I will follow-up.    [name] A Sharisa Toves 09/10/2020, 2:10 PM

## 2020-09-10 NOTE — Progress Notes (Addendum)
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  Patient is unable to provide any history due to nonverbal status and cognitive impairment.  Father unable to provide history due to dementia, therefore entire history is obtained by ED and chart review.  Caregiver has been on vacation for several days and family at home have not been able to adequately take care of the patient.  Patient's father reportedly has dementia and brother reportedly has a schizophrenia.  Patient was admitted to the hospital until caretaker returns.   Patient will now be placed in facility.   Assessment & Plan:   Principal Problem:   Failure to thrive in adult Active Problems:   Rett syndrome   Seizure disorder (HCC)   1-Failure to thrive/Rett syndrome with cognitive disability/developmental delay/nonverbal with oral phase dysphagia at baseline, requiring full-time care for dependence on others: -Patient was admitted to the hospital, due to unsafe home environment.  Father has dementia, brother has a schizophrenia.  Caregiver was on vacation. Plan is for placement.  -On oral medications.  -Stable, awaiting placement.   2-History of disorder: No seizure activity this admission. Continue with Ativan, Trileptal, Gabitril.  3-Baclofen, unintentional increased dose of baclofen on 5/30 and 5/31: Patient received wrong dose of medication, Baclofen 100 mg two doses. No significant adverse effect from it.  -I was informed by Pharmacist, that patient has received again the wrong dose of Baclofen( 6/03), 100 mg this am. I have discussed with staff to monitor mental status and Vitals. Patient has  remain alert. Family has been informed.  -Patient has remain stable. Plan to resume Baclofen, will use pill form.   4-Anxiety/depression:  Continue with fluoxetin and ativan.   Candida Vulvovaginitis; on Nystatin.   Tachycardia; start IV fluids.  She has been on Lovenox for DVT prophylaxis.  Check Labs/   Nutrition Problem: Severe Malnutrition Etiology: chronic illness (Rett syndrome)    Signs/Symptoms: severe muscle depletion,severe fat depletion,percent weight loss (11.6% x 6 months) Percent weight loss: 11.6 % (x 6 months)    Interventions: Ensure Enlive (each supplement provides 350kcal and 20 grams of protein)  Estimated body mass index is 13.49 kg/m as calculated from the following:   Height as of this encounter: 4\' 11"  (1.499 m).   Weight as of this encounter: 30.3 kg.   DVT prophylaxis: Lovenox Code Status: DNR Family Communication: Brother updated. 6/06 Disposition Plan:  Status is: Observation  The patient will require care spanning > 2 midnights and should be moved to inpatient because: IV treatments appropriate due to intensity of illness or inability to take PO  Dispo: The patient is from: Home              Anticipated d/c is to: SNF              Patient currently is medically stable to d/c. awaiting placement.    Difficult to place patient No        Consultants:   none  Procedures:     Antimicrobials:    Subjective: She is alert, non verbal.   Objective: Vitals:   09/10/20 0508 09/10/20 0811 09/10/20 1200 09/10/20 1429  BP: 104/78 113/81 112/80 99/63  Pulse:  86 (!) 103 (!) 43 (!) 114  Resp: 18 15 15 16   Temp: 98.8 F (37.1 C) 98.9 F (37.2 C) 97.9 F (36.6 C) 97.9 F (36.6 C)  TempSrc:    Oral  SpO2: 91% 92% (!) 82% 93%  Weight:      Height:        Intake/Output Summary (Last 24 hours) at 09/10/2020 1440 Last data filed at 09/09/2020 1639 Gross per 24 hour  Intake 373.02 ml  Output --  Net 373.02 ml   Filed Weights    09/03/20 1613 09/03/20 2240  Weight: 40.8 kg 30.3 kg    Examination:  General exam: NAD, thin Respiratory system: CTA Cardiovascular system: S 1, S 2 RRR Gastrointestinal system: BS present, soft nt Central nervous system: Alert, non verbal.    Data Reviewed: I have personally reviewed following labs and imaging studies  CBC: Recent Labs  Lab 09/03/20 1645  WBC 13.8*  NEUTROABS 12.2*  HGB 13.7  HCT 42.4  MCV 92.0  PLT 134*   Basic Metabolic Panel: Recent Labs  Lab 09/03/20 1645 09/04/20 0351 09/08/20 0754  NA 138 137 132*  K 3.9 3.9 3.9  CL 104 107 100  CO2 24 24 23   GLUCOSE 152* 79 84  BUN 17 12 22*  CREATININE 0.61 0.65 0.44  CALCIUM 9.2 8.3* 8.2*  MG  --  1.9  --   PHOS  --  4.0  --    GFR: Estimated Creatinine Clearance: 42 mL/min (by C-G formula based on SCr of 0.44 mg/dL). Liver Function Tests: Recent Labs  Lab 09/03/20 1645 09/04/20 0351  AST 52* 34  ALT 34 28  ALKPHOS 50 34*  BILITOT 0.4 0.6  PROT 7.4 5.9*  ALBUMIN 4.0 3.1*   No results for input(s): LIPASE, AMYLASE in the last 168 hours. No results for input(s): AMMONIA in the last 168 hours. Coagulation Profile: No results for input(s): INR, PROTIME in the last 168 hours. Cardiac Enzymes: No results for input(s): CKTOTAL, CKMB, CKMBINDEX, TROPONINI in the last 168 hours. BNP (last 3 results) No results for input(s): PROBNP in the last 8760 hours. HbA1C: No results for input(s): HGBA1C in the last 72 hours. CBG: No results for input(s): GLUCAP in the last 168 hours. Lipid Profile: No results for input(s): CHOL, HDL, LDLCALC, TRIG, CHOLHDL, LDLDIRECT in the last 72 hours. Thyroid Function Tests: No results for input(s): TSH, T4TOTAL, FREET4, T3FREE, THYROIDAB in the last 72 hours. Anemia Panel: No results for input(s): VITAMINB12, FOLATE, FERRITIN, TIBC, IRON, RETICCTPCT in the last 72 hours. Sepsis Labs: No results for input(s): PROCALCITON, LATICACIDVEN in the last 168  hours.  Recent Results (from the past 240 hour(s))  Resp Panel by RT-PCR (Flu A&B, Covid) Nasopharyngeal Swab     Status: None   Collection Time: 09/03/20  4:45 PM   Specimen: Nasopharyngeal Swab; Nasopharyngeal(NP) swabs in vial transport medium  Result Value Ref Range Status   SARS Coronavirus 2 by RT PCR NEGATIVE NEGATIVE Final    Comment: (NOTE) SARS-CoV-2 target nucleic acids are NOT DETECTED.  The SARS-CoV-2 RNA is generally detectable in upper respiratory specimens during the acute phase of infection. The lowest concentration of SARS-CoV-2 viral copies this assay can detect is 138 copies/mL. A negative result does not preclude SARS-Cov-2 infection and should not be used as the sole basis for treatment or other patient management decisions. A negative result may occur with  improper specimen collection/handling, submission of specimen other than nasopharyngeal swab, presence of viral  mutation(s) within the areas targeted by this assay, and inadequate number of viral copies(<138 copies/mL). A negative result must be combined with clinical observations, patient history, and epidemiological information. The expected result is Negative.  Fact Sheet for Patients:  BloggerCourse.com  Fact Sheet for Healthcare Providers:  SeriousBroker.it  This test is no t yet approved or cleared by the [place] FDA and  has been authorized for detection and/or diagnosis of SARS-CoV-2 by FDA under an Emergency Use Authorization (EUA). This EUA will remain  in effect (meaning this test can be used) for the duration of the COVID-19 declaration under Section 564(b)(1) of the Act, 21 U.S.C.section 360bbb-3(b)(1), unless the authorization is terminated  or revoked sooner.       Influenza A by PCR NEGATIVE NEGATIVE Final   Influenza B by PCR NEGATIVE NEGATIVE Final    Comment: (NOTE) The Xpert Xpress SARS-CoV-2/FLU/RSV plus assay is intended  as an aid in the diagnosis of influenza from Nasopharyngeal swab specimens and should not be used as a sole basis for treatment. Nasal washings and aspirates are unacceptable for Xpert Xpress SARS-CoV-2/FLU/RSV testing.  Fact Sheet for Patients: BloggerCourse.com  Fact Sheet for Healthcare Providers: SeriousBroker.it  This test is not yet approved or cleared by the [place] FDA and has been authorized for detection and/or diagnosis of SARS-CoV-2 by FDA under an Emergency Use Authorization (EUA). This EUA will remain in effect (meaning this test can be used) for the duration of the COVID-19 declaration under Section 564(b)(1) of the Act, 21 U.S.C. section 360bbb-3(b)(1), unless the authorization is terminated or revoked.  Performed at [facility], [address] [place], [place] [address]   SARS CORONAVIRUS 2 (TAT 6-24 HRS) Nasopharyngeal Nasopharyngeal Swab     Status: None   Collection Time: 09/06/20  3:00 PM   Specimen: Nasopharyngeal Swab  Result Value Ref Range Status   SARS Coronavirus 2 NEGATIVE NEGATIVE Final    Comment: (NOTE) SARS-CoV-2 target nucleic acids are NOT DETECTED.  The SARS-CoV-2 RNA is generally detectable in upper and lower respiratory specimens during the acute phase of infection. Negative results do not preclude SARS-CoV-2 infection, do not rule out co-infections with other pathogens, and should not be used as the sole basis for treatment or other patient management decisions. Negative results must be combined with clinical observations, patient history, and epidemiological information. The expected result is Negative.  Fact Sheet for Patients: HairSlick.no  Fact Sheet for Healthcare Providers: quierodirigir.com  This test is not yet approved or cleared by the [place] FDA and  has been authorized for detection and/or  diagnosis of SARS-CoV-2 by FDA under an Emergency Use Authorization (EUA). This EUA will remain  in effect (meaning this test can be used) for the duration of the COVID-19 declaration under Se ction 564(b)(1) of the Act, 21 U.S.C. section 360bbb-3(b)(1), unless the authorization is terminated or revoked sooner.  Performed at [facility], [address], [place], [place] [address]   Resp Panel by RT-PCR (Flu A&B, Covid) Nasopharyngeal Swab     Status: None   Collection Time: 09/10/20  9:00 AM   Specimen: Nasopharyngeal Swab; Nasopharyngeal(NP) swabs in vial transport medium  Result Value Ref Range Status   SARS Coronavirus 2 by RT PCR NEGATIVE NEGATIVE Final    Comment: (NOTE) SARS-CoV-2 target nucleic acids are NOT DETECTED.  The SARS-CoV-2 RNA is generally detectable in upper respiratory specimens during the acute phase of infection. The lowest concentration of SARS-CoV-2 viral copies this assay  can detect is 138 copies/mL. A negative result does not preclude SARS-Cov-2 infection and should not be used as the sole basis for treatment or other patient management decisions. A negative result may occur with  improper specimen collection/handling, submission of specimen other than nasopharyngeal swab, presence of viral mutation(s) within the areas targeted by this assay, and inadequate number of viral copies(<138 copies/mL). A negative result must be combined with clinical observations, patient history, and epidemiological information. The expected result is Negative.  Fact Sheet for Patients:  BloggerCourse.comhttps://www.fda.gov/media/152166/download  Fact Sheet for Healthcare Providers:  SeriousBroker.ithttps://www.fda.gov/media/152162/download  This test is no t yet approved or cleared by the [place] FDA and  has been authorized for detection and/or diagnosis of SARS-CoV-2 by FDA under an Emergency Use Authorization (EUA). This EUA will remain  in effect (meaning this test can be used) for the  duration of the COVID-19 declaration under Section 564(b)(1) of the Act, 21 U.S.C.section 360bbb-3(b)(1), unless the authorization is terminated  or revoked sooner.       Influenza A by PCR NEGATIVE NEGATIVE Final   Influenza B by PCR NEGATIVE NEGATIVE Final    Comment: (NOTE) The Xpert Xpress SARS-CoV-2/FLU/RSV plus assay is intended as an aid in the diagnosis of influenza from Nasopharyngeal swab specimens and should not be used as a sole basis for treatment. Nasal washings and aspirates are unacceptable for Xpert Xpress SARS-CoV-2/FLU/RSV testing.  Fact Sheet for Patients: BloggerCourse.comhttps://www.fda.gov/media/152166/download  Fact Sheet for Healthcare Providers: SeriousBroker.ithttps://www.fda.gov/media/152162/download  This test is not yet approved or cleared by the [place] FDA and has been authorized for detection and/or diagnosis of SARS-CoV-2 by FDA under an Emergency Use Authorization (EUA). This EUA will remain in effect (meaning this test can be used) for the duration of the COVID-19 declaration under Section 564(b)(1) of the Act, 21 U.S.C. section 360bbb-3(b)(1), unless the authorization is terminated or revoked.  Performed at [facility], [address] [place], [place] [id number]          Radiology Studies: No results found.      Scheduled Meds: . baclofen  10 mg Oral TID  . docusate  100 mg Oral BID  . enoxaparin (LOVENOX) injection  30 mg Subcutaneous Q24H  . feeding supplement  237 mL Oral TID BM  . FLUoxetine  10 mg Oral Daily  . LORazepam  1 mg Oral BID  . nystatin cream   Topical BID  . OXcarbazepine  600 mg Oral BID  . tiaGABine  4 mg Oral BID   Continuous Infusions: . sodium chloride       LOS: 5 days    Time spent: 35 minutes.     Alicia CoryBelkys A Luvina Poirier, MD [facility]   If 7PM-7AM, please contact night-coverage [email or web address]  09/10/2020, 2:40 PM

## 2020-09-11 LAB — BASIC METABOLIC PANEL
Anion gap: 6 (ref 5–15)
BUN: 17 mg/dL (ref 6–20)
CO2: 25 mmol/L (ref 22–32)
Calcium: 8.2 mg/dL — ABNORMAL LOW (ref 8.9–10.3)
Chloride: 107 mmol/L (ref 98–111)
Creatinine, Ser: 0.38 mg/dL — ABNORMAL LOW (ref 0.44–1.00)
GFR, Estimated: >60 mL/min (ref >60)
Glucose, Bld: 119 mg/dL — ABNORMAL HIGH (ref 70–99)
Potassium: 3.9 mmol/L (ref 3.5–5.1)
Sodium: 138 mmol/L (ref 135–145)

## 2020-09-11 MED ORDER — TIAGABINE HCL 4 MG PO TABS: 4.0000 mg | ORAL_TABLET | Freq: Two times a day (BID) | ORAL | 1 refills | Status: DC

## 2020-09-11 MED ORDER — LORAZEPAM 2 MG/ML PO CONC: 1.0000 mg | Freq: Two times a day (BID) | ORAL | 0 refills | Status: DC

## 2020-09-11 MED ORDER — SODIUM CHLORIDE 0.9 % IV BOLUS
250.0000 mL | Freq: Once | INTRAVENOUS | Status: AC
  Administered 2020-09-11: 09:00:00 250 mL via INTRAVENOUS

## 2020-09-11 MED ORDER — BACLOFEN 10 MG PO TABS: 10.0000 mg | ORAL_TABLET | Freq: Three times a day (TID) | ORAL | 0 refills | Status: DC

## 2020-09-11 MED ORDER — DOCUSATE SODIUM 50 MG/5ML PO LIQD: 100.0000 mg | Freq: Two times a day (BID) | ORAL | 0 refills | Status: DC

## 2020-09-11 MED ORDER — NYSTATIN 100000 UNIT/GM EX CREA: TOPICAL_CREAM | Freq: Two times a day (BID) | CUTANEOUS | 0 refills | Status: AC

## 2020-09-11 MED ORDER — OXCARBAZEPINE 300 MG/5ML PO SUSP: 600.0000 mg | Freq: Two times a day (BID) | ORAL | 12 refills | Status: DC

## 2020-09-11 MED ORDER — FLUOXETINE HCL 20 MG/5ML PO SOLN
10.0000 mg | Freq: Every day | ORAL | 3 refills | Status: DC
Start: 2020-09-11 — End: 2020-10-31

## 2020-09-11 NOTE — Discharge Summary (Signed)
Physician Discharge Summary  Alicia Duncan ZOX:[id number] DOB: 06/08/1975 DOA: 09/03/2020  PCP: Maryland Pink, MD  Admit date: 09/03/2020 Discharge date: 09/11/2020  Admitted From: Home  Disposition:  SNF  Recommendations for Outpatient Follow-up:  1. Follow up with PCP in 1-2 weeks 2. Please obtain BMP/CBC in one week 3. Consider palliative care referral.     Discharge Condition: Stable.  CODE STATUS: DNR Diet recommendation: Dysphagia 1 thin fluids.   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  Patient is unable to provide any history due to nonverbal status and cognitive impairment.  Father unable to provide history due to dementia, therefore entire history is obtained by ED and chart review.  Caregiver has been on vacation for several days and family at home have not been able to adequately take care of the patient.  Patient's father reportedly has dementia and brother reportedly has a schizophrenia.  Patient was admitted to the hospital until caretaker returns.   Patient will now be placed in facility.   1-Failure to thrive/Rett syndrome with cognitive disability/developmental delay/nonverbal with oral phase dysphagia at baseline, requiring full-time care for dependence on others: -Patient was admitted to the hospital, due to unsafe home environment.  Father has dementia, brother has a schizophrenia.  Caregiver was on vacation. Plan is for placement.  -On oral medications.  -Stable, awaiting placement.   2-History of disorder: No seizure activity this admission. Continue with Ativan, Trileptal, Gabitril.  3-Baclofen, unintentional increased dose of baclofen on 5/30 and 5/31: Patient received wrong dose of medication, Baclofen 100 mg two doses. No  significant adverse effect from it.  -I was informed by Pharmacist, that patient has received again the wrong dose of Baclofen( 6/03), 100 mg this am. I have discussed with staff to monitor mental status and Vitals. Patient has remain alert. Family has been informed.  -Patient has remain stable. Plan to resume Baclofen.  4-Anxiety/depression:  Continue with fluoxetin and ativan.   Candida Vulvovaginitis; on Nystatin.   Tachycardia;  Treated with IV fluids.  She has been on Lovenox for DVT prophylaxis.  B-met stable.  Resolved.   Severe Protein Malnutrition;  Continue with Ensure TID>  Nutrition Problem: Severe Malnutrition Etiology: chronic illness (Rett syndrome)  Stable for transfer to SNF Change most of the medications to Liquid form.      Discharge Diagnoses:  Principal Problem:   Failure to thrive in adult Active Problems:   Rett syndrome   Seizure disorder [facility])    Discharge Instructions  Discharge Instructions    Diet - low sodium heart healthy   Complete by: As directed    Increase activity slowly   Complete by: As directed      Allergies as of 09/11/2020      Reactions   Benadryl [diphenhydramine]    Penicillins Other (See Comments)   Has patient had a PCN reaction causing immediate rash, facial/tongue/throat swelling, SOB or lightheadedness with hypotension: Unknown Has patient had a PCN reaction causing severe rash involving mucus membranes or skin necrosis: Unknown Has patient had a PCN reaction that required hospitalization: Unknown Has patient had a PCN reaction occurring within the last 10 years: Unknown If all of the above answers are "NO", then may proceed with Cephalosporin use.      Medication List    STOP taking  these medications   FLUoxetine 10 MG capsule Commonly known as: PROZAC Replaced by: FLUoxetine 20 MG/5ML solution   LORazepam 1 MG tablet Commonly known as: ATIVAN Replaced by: LORazepam 2 MG/ML concentrated solution    Oxcarbazepine 300 MG tablet Commonly known as: TRILEPTAL Replaced by: OXcarbazepine 300 MG/5ML suspension     TAKE these medications   baclofen 10 MG tablet Commonly known as: LIORESAL Take 1 tablet (10 mg total) by mouth 3 (three) times daily.   docusate 50 MG/5ML liquid Commonly known as: COLACE Take 10 mLs (100 mg total) by mouth 2 (two) times daily.   FLUoxetine 20 MG/5ML solution Commonly known as: PROZAC Take 2.5 mLs (10 mg total) by mouth daily. Replaces: FLUoxetine 10 MG capsule   LORazepam 2 MG/ML concentrated solution Commonly known as: ATIVAN Take 0.5 mLs (1 mg total) by mouth 2 (two) times daily. Replaces: LORazepam 1 MG tablet   nystatin cream Commonly known as: MYCOSTATIN Apply topically 2 (two) times daily.   omeprazole 20 MG capsule Commonly known as: PRILOSEC Take 20 mg by mouth daily.   OXcarbazepine 300 MG/5ML suspension Commonly known as: TRILEPTAL Take 10 mLs (600 mg total) by mouth 2 (two) times daily. Replaces: Oxcarbazepine 300 MG tablet   tiaGABine 4 MG tablet Commonly known as: GABITRIL Take 1 tablet (4 mg total) by mouth 2 (two) times daily.       Contact information for after-discharge care    Destination    [facility] .   Service: Skilled Nursing Contact information: [address] [phone]                 Allergies  Allergen Reactions  . Benadryl [Diphenhydramine]   . Penicillins Other (See Comments)    Has patient had a PCN reaction causing immediate rash, facial/tongue/throat swelling, SOB or lightheadedness with hypotension: Unknown Has patient had a PCN reaction causing severe rash involving mucus membranes or skin necrosis: Unknown Has patient had a PCN reaction that required hospitalization: Unknown Has patient had a PCN reaction occurring within the last 10 years: Unknown If all of the above answers are "NO", then may proceed with Cephalosporin use.      Consultations: None  Procedures/Studies: No results found.    Subjective: Alert, non Verbal.   Discharge Exam: Vitals:   09/11/20 0520 09/11/20 0827  BP:  94/60  Pulse:  98  Resp: 17 17  Temp:  97.6 F (36.4 C)  SpO2:  97%     General: Thin, NAD Cardiovascular: RRR, S1/S2 +, no rubs, no gallops Respiratory: CTA bilaterally, no wheezing, no rhonchi Abdominal: Soft, NT, ND, bowel sounds + Extremities: Contraction,     The results of significant diagnostics from this hospitalization (including imaging, microbiology, ancillary and laboratory) are listed below for reference.     Microbiology: Recent Results (from the past 240 hour(s))  Resp Panel by RT-PCR (Flu A&B, Covid) Nasopharyngeal Swab     Status: None   Collection Time: 09/03/20  4:45 PM   Specimen: Nasopharyngeal Swab; Nasopharyngeal(NP) swabs in vial transport medium  Result Value Ref Range Status   SARS Coronavirus 2 by RT PCR NEGATIVE NEGATIVE Final    Comment: (NOTE) SARS-CoV-2 target nucleic acids are NOT DETECTED.  The SARS-CoV-2 RNA is generally detectable in upper respiratory specimens during the acute phase of infection. The lowest concentration of SARS-CoV-2 viral copies this assay can detect is 138 copies/mL. A negative result does not preclude SARS-Cov-2 infection and should not be  used as the sole basis for treatment or other patient management decisions. A negative result may occur with  improper specimen collection/handling, submission of specimen other than nasopharyngeal swab, presence of viral mutation(s) within the areas targeted by this assay, and inadequate number of viral copies(<138 copies/mL). A negative result must be combined with clinical observations, patient history, and epidemiological information. The expected result is Negative.  Fact Sheet for Patients:  EntrepreneurPulse.com.au  Fact Sheet for Healthcare Providers:   IncredibleEmployment.be  This test is no t yet approved or cleared by the [place] FDA and  has been authorized for detection and/or diagnosis of SARS-CoV-2 by FDA under an Emergency Use Authorization (EUA). This EUA will remain  in effect (meaning this test can be used) for the duration of the COVID-19 declaration under Section 564(b)(1) of the Act, 21 U.S.C.section 360bbb-3(b)(1), unless the authorization is terminated  or revoked sooner.       Influenza A by PCR NEGATIVE NEGATIVE Final   Influenza B by PCR NEGATIVE NEGATIVE Final    Comment: (NOTE) The Xpert Xpress SARS-CoV-2/FLU/RSV plus assay is intended as an aid in the diagnosis of influenza from Nasopharyngeal swab specimens and should not be used as a sole basis for treatment. Nasal washings and aspirates are unacceptable for Xpert Xpress SARS-CoV-2/FLU/RSV testing.  Fact Sheet for Patients: EntrepreneurPulse.com.au  Fact Sheet for Healthcare Providers: IncredibleEmployment.be  This test is not yet approved or cleared by the [place] FDA and has been authorized for detection and/or diagnosis of SARS-CoV-2 by FDA under an Emergency Use Authorization (EUA). This EUA will remain in effect (meaning this test can be used) for the duration of the COVID-19 declaration under Section 564(b)(1) of the Act, 21 U.S.C. section 360bbb-3(b)(1), unless the authorization is terminated or revoked.  Performed at [facility], [place], [place] [address]   SARS CORONAVIRUS 2 (TAT 6-24 HRS) Nasopharyngeal Nasopharyngeal Swab     Status: None   Collection Time: 09/06/20  3:00 PM   Specimen: Nasopharyngeal Swab  Result Value Ref Range Status   SARS Coronavirus 2 NEGATIVE NEGATIVE Final    Comment: (NOTE) SARS-CoV-2 target nucleic acids are NOT DETECTED.  The SARS-CoV-2 RNA is generally detectable in upper and lower respiratory specimens during  the acute phase of infection. Negative results do not preclude SARS-CoV-2 infection, do not rule out co-infections with other pathogens, and should not be used as the sole basis for treatment or other patient management decisions. Negative results must be combined with clinical observations, patient history, and epidemiological information. The expected result is Negative.  Fact Sheet for Patients: SugarRoll.be  Fact Sheet for Healthcare Providers: https://www.woods-mathews.com/  This test is not yet approved or cleared by the [place] FDA and  has been authorized for detection and/or diagnosis of SARS-CoV-2 by FDA under an Emergency Use Authorization (EUA). This EUA will remain  in effect (meaning this test can be used) for the duration of the COVID-19 declaration under Se ction 564(b)(1) of the Act, 21 U.S.C. section 360bbb-3(b)(1), unless the authorization is terminated or revoked sooner.  Performed at [facility], [place] [address], [place], [place] [address]   Resp Panel by RT-PCR (Flu A&B, Covid) Nasopharyngeal Swab     Status: None   Collection Time: 09/10/20  9:00 AM   Specimen: Nasopharyngeal Swab; Nasopharyngeal(NP) swabs in vial transport medium  Result Value Ref Range Status   SARS Coronavirus 2 by RT PCR NEGATIVE NEGATIVE Final    Comment: (NOTE) SARS-CoV-2  target nucleic acids are NOT DETECTED.  The SARS-CoV-2 RNA is generally detectable in upper respiratory specimens during the acute phase of infection. The lowest concentration of SARS-CoV-2 viral copies this assay can detect is 138 copies/mL. A negative result does not preclude SARS-Cov-2 infection and should not be used as the sole basis for treatment or other patient management decisions. A negative result may occur with  improper specimen collection/handling, submission of specimen other than nasopharyngeal swab, presence of viral mutation(s) within the areas  targeted by this assay, and inadequate number of viral copies(<138 copies/mL). A negative result must be combined with clinical observations, patient history, and epidemiological information. The expected result is Negative.  Fact Sheet for Patients:  EntrepreneurPulse.com.au  Fact Sheet for Healthcare Providers:  IncredibleEmployment.be  This test is no t yet approved or cleared by the [place] FDA and  has been authorized for detection and/or diagnosis of SARS-CoV-2 by FDA under an Emergency Use Authorization (EUA). This EUA will remain  in effect (meaning this test can be used) for the duration of the COVID-19 declaration under Section 564(b)(1) of the Act, 21 U.S.C.section 360bbb-3(b)(1), unless the authorization is terminated  or revoked sooner.       Influenza A by PCR NEGATIVE NEGATIVE Final   Influenza B by PCR NEGATIVE NEGATIVE Final    Comment: (NOTE) The Xpert Xpress SARS-CoV-2/FLU/RSV plus assay is intended as an aid in the diagnosis of influenza from Nasopharyngeal swab specimens and should not be used as a sole basis for treatment. Nasal washings and aspirates are unacceptable for Xpert Xpress SARS-CoV-2/FLU/RSV testing.  Fact Sheet for Patients: EntrepreneurPulse.com.au  Fact Sheet for Healthcare Providers: IncredibleEmployment.be  This test is not yet approved or cleared by the [place] FDA and has been authorized for detection and/or diagnosis of SARS-CoV-2 by FDA under an Emergency Use Authorization (EUA). This EUA will remain in effect (meaning this test can be used) for the duration of the COVID-19 declaration under Section 564(b)(1) of the Act, 21 U.S.C. section 360bbb-3(b)(1), unless the authorization is terminated or revoked.  Performed at [facility], [address] [place], [place] [address]      Labs: BNP (last 3 results) No results for input(s): BNP  in the last 8760 hours. Basic Metabolic Panel: Recent Labs  Lab 09/08/20 0754 09/10/20 1451 09/11/20 0453  NA 132* 137 138  K 3.9 3.9 3.9  CL 100 106 107  CO2 23 25 25   GLUCOSE 84 143* 119*  BUN 22* 16 17  CREATININE 0.44 0.42* 0.38*  CALCIUM 8.2* 8.4* 8.2*   Liver Function Tests: No results for input(s): AST, ALT, ALKPHOS, BILITOT, PROT, ALBUMIN in the last 168 hours. No results for input(s): LIPASE, AMYLASE in the last 168 hours. No results for input(s): AMMONIA in the last 168 hours. CBC: Recent Labs  Lab 09/10/20 1451  WBC 11.0*  HGB 12.7  HCT 39.5  MCV 92.3  PLT 167   Cardiac Enzymes: No results for input(s): CKTOTAL, CKMB, CKMBINDEX, TROPONINI in the last 168 hours. BNP: Invalid input(s): POCBNP CBG: No results for input(s): GLUCAP in the last 168 hours. D-Dimer No results for input(s): DDIMER in the last 72 hours. Hgb A1c No results for input(s): HGBA1C in the last 72 hours. Lipid Profile No results for input(s): CHOL, HDL, LDLCALC, TRIG, CHOLHDL, LDLDIRECT in the last 72 hours. Thyroid function studies No results for input(s): TSH, T4TOTAL, T3FREE, THYROIDAB in the last 72 hours.  Invalid input(s): FREET3 Anemia work up No results  for input(s): VITAMINB12, FOLATE, FERRITIN, TIBC, IRON, RETICCTPCT in the last 72 hours. Urinalysis    Component Value Date/Time   COLORURINE YELLOW (A) 07/16/2020 1728   APPEARANCEUR CLOUDY (A) 07/16/2020 1728   LABSPEC 1.023 07/16/2020 1728   PHURINE 8.0 07/16/2020 1728   GLUCOSEU NEGATIVE 07/16/2020 1728   HGBUR NEGATIVE 07/16/2020 1728   BILIRUBINUR NEGATIVE 07/16/2020 1728   KETONESUR 5 (A) 07/16/2020 1728   PROTEINUR NEGATIVE 07/16/2020 1728   UROBILINOGEN 1.0 04/25/2013 1427   NITRITE NEGATIVE 07/16/2020 1728   LEUKOCYTESUR NEGATIVE 07/16/2020 1728   Sepsis Labs Invalid input(s): PROCALCITONIN,  WBC,  LACTICIDVEN Microbiology Recent Results (from the past 240 hour(s))  Resp Panel by RT-PCR (Flu A&B, Covid)  Nasopharyngeal Swab     Status: None   Collection Time: 09/03/20  4:45 PM   Specimen: Nasopharyngeal Swab; Nasopharyngeal(NP) swabs in vial transport medium  Result Value Ref Range Status   SARS Coronavirus 2 by RT PCR NEGATIVE NEGATIVE Final    Comment: (NOTE) SARS-CoV-2 target nucleic acids are NOT DETECTED.  The SARS-CoV-2 RNA is generally detectable in upper respiratory specimens during the acute phase of infection. The lowest concentration of SARS-CoV-2 viral copies this assay can detect is 138 copies/mL. A negative result does not preclude SARS-Cov-2 infection and should not be used as the sole basis for treatment or other patient management decisions. A negative result may occur with  improper specimen collection/handling, submission of specimen other than nasopharyngeal swab, presence of viral mutation(s) within the areas targeted by this assay, and inadequate number of viral copies(<138 copies/mL). A negative result must be combined with clinical observations, patient history, and epidemiological information. The expected result is Negative.  Fact Sheet for Patients:  EntrepreneurPulse.com.au  Fact Sheet for Healthcare Providers:  IncredibleEmployment.be  This test is no t yet approved or cleared by the [place] FDA and  has been authorized for detection and/or diagnosis of SARS-CoV-2 by FDA under an Emergency Use Authorization (EUA). This EUA will remain  in effect (meaning this test can be used) for the duration of the COVID-19 declaration under Section 564(b)(1) of the Act, 21 U.S.C.section 360bbb-3(b)(1), unless the authorization is terminated  or revoked sooner.       Influenza A by PCR NEGATIVE NEGATIVE Final   Influenza B by PCR NEGATIVE NEGATIVE Final    Comment: (NOTE) The Xpert Xpress SARS-CoV-2/FLU/RSV plus assay is intended as an aid in the diagnosis of influenza from Nasopharyngeal swab specimens and should not be  used as a sole basis for treatment. Nasal washings and aspirates are unacceptable for Xpert Xpress SARS-CoV-2/FLU/RSV testing.  Fact Sheet for Patients: EntrepreneurPulse.com.au  Fact Sheet for Healthcare Providers: IncredibleEmployment.be  This test is not yet approved or cleared by the [place] FDA and has been authorized for detection and/or diagnosis of SARS-CoV-2 by FDA under an Emergency Use Authorization (EUA). This EUA will remain in effect (meaning this test can be used) for the duration of the COVID-19 declaration under Section 564(b)(1) of the Act, 21 U.S.C. section 360bbb-3(b)(1), unless the authorization is terminated or revoked.  Performed at [facility] [place], [place] [address]   SARS CORONAVIRUS 2 (TAT 6-24 HRS) Nasopharyngeal Nasopharyngeal Swab     Status: None   Collection Time: 09/06/20  3:00 PM   Specimen: Nasopharyngeal Swab  Result Value Ref Range Status   SARS Coronavirus 2 NEGATIVE NEGATIVE Final    Comment: (NOTE) SARS-CoV-2 target nucleic acids are NOT DETECTED.  The SARS-CoV-2 RNA is  generally detectable in upper and lower respiratory specimens during the acute phase of infection. Negative results do not preclude SARS-CoV-2 infection, do not rule out co-infections with other pathogens, and should not be used as the sole basis for treatment or other patient management decisions. Negative results must be combined with clinical observations, patient history, and epidemiological information. The expected result is Negative.  Fact Sheet for Patients: SugarRoll.be  Fact Sheet for Healthcare Providers: https://www.woods-mathews.com/  This test is not yet approved or cleared by the [place] FDA and  has been authorized for detection and/or diagnosis of SARS-CoV-2 by FDA under an Emergency Use Authorization (EUA). This EUA will remain  in  effect (meaning this test can be used) for the duration of the COVID-19 declaration under Se ction 564(b)(1) of the Act, 21 U.S.C. section 360bbb-3(b)(1), unless the authorization is terminated or revoked sooner.  Performed at [facility], [place] [address], [place], [place] [address]   Resp Panel by RT-PCR (Flu A&B, Covid) Nasopharyngeal Swab     Status: None   Collection Time: 09/10/20  9:00 AM   Specimen: Nasopharyngeal Swab; Nasopharyngeal(NP) swabs in vial transport medium  Result Value Ref Range Status   SARS Coronavirus 2 by RT PCR NEGATIVE NEGATIVE Final    Comment: (NOTE) SARS-CoV-2 target nucleic acids are NOT DETECTED.  The SARS-CoV-2 RNA is generally detectable in upper respiratory specimens during the acute phase of infection. The lowest concentration of SARS-CoV-2 viral copies this assay can detect is 138 copies/mL. A negative result does not preclude SARS-Cov-2 infection and should not be used as the sole basis for treatment or other patient management decisions. A negative result may occur with  improper specimen collection/handling, submission of specimen other than nasopharyngeal swab, presence of viral mutation(s) within the areas targeted by this assay, and inadequate number of viral copies(<138 copies/mL). A negative result must be combined with clinical observations, patient history, and epidemiological information. The expected result is Negative.  Fact Sheet for Patients:  EntrepreneurPulse.com.au  Fact Sheet for Healthcare Providers:  IncredibleEmployment.be  This test is no t yet approved or cleared by the [place] FDA and  has been authorized for detection and/or diagnosis of SARS-CoV-2 by FDA under an Emergency Use Authorization (EUA). This EUA will remain  in effect (meaning this test can be used) for the duration of the COVID-19 declaration under Section 564(b)(1) of the Act, 21 U.S.C.section  360bbb-3(b)(1), unless the authorization is terminated  or revoked sooner.       Influenza A by PCR NEGATIVE NEGATIVE Final   Influenza B by PCR NEGATIVE NEGATIVE Final    Comment: (NOTE) The Xpert Xpress SARS-CoV-2/FLU/RSV plus assay is intended as an aid in the diagnosis of influenza from Nasopharyngeal swab specimens and should not be used as a sole basis for treatment. Nasal washings and aspirates are unacceptable for Xpert Xpress SARS-CoV-2/FLU/RSV testing.  Fact Sheet for Patients: EntrepreneurPulse.com.au  Fact Sheet for Healthcare Providers: IncredibleEmployment.be  This test is not yet approved or cleared by the [place] FDA and has been authorized for detection and/or diagnosis of SARS-CoV-2 by FDA under an Emergency Use Authorization (EUA). This EUA will remain in effect (meaning this test can be used) for the duration of the COVID-19 declaration under Section 564(b)(1) of the Act, 21 U.S.C. section 360bbb-3(b)(1), unless the authorization is terminated or revoked.  Performed at [facility], [address], [place], [place] [address]      Time coordinating discharge: 40 minutes  SIGNED:  Elmarie Shiley, MD  [facility]

## 2020-09-11 NOTE — Progress Notes (Signed)
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  GOAL:  Patient will meet greater than or equal to 90% of their needs,Weight gain  MONITOR:  PO intake,Supplement acceptance,Labs,Weight trends  REASON FOR ASSESSMENT:  Other (Comment),Malnutrition Screening Tool (Low BMI, 13.48)    ASSESSMENT:  Pt brought to ED after concerns were raised over her care at home. Caregiver has been on vacation for several days and family at home have not been able to adequately take care of the patient. 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   Pt remains inpatient at this time. Discussed in rounds, MD reports medically stable for discharge once facility placement is secured.   Reviewed intake of meals, limited meals recorded over the last week in flow sheet but overall intake appears poor. Pt is routinely accepting the ensure enlive per RN documentation. Inquired about intake of supplements, awaiting response from RN. Will request new measured weight to assess for weight gain.  Average Meal Intake: . 5/30-5/31: 50% intake x 1 recorded meal . 6/1-6/7: 38% intake x 2 recorded meals  Nutritionally Relevant Medications Scheduled Meds: . baclofen  10 mg Oral TID  . docusate  100 mg Oral BID   Continuous Infusions: . sodium  chloride 75 mL/hr at 09/11/20 0543   PRN Meds: ondansetron  Labs reviewed  NUTRITION - FOCUSED PHYSICAL EXAM: Flowsheet Row Most Recent Value  Orbital Region Mild depletion  Upper Arm Region Severe depletion  Thoracic and Lumbar Region Severe depletion  Buccal Region Mild depletion  Temple Region Mild depletion  Clavicle Bone Region Mild depletion  Clavicle and Acromion Bone Region Severe depletion  Scapular Bone Region Severe depletion  Dorsal Hand Mild depletion  Patellar Region Severe depletion  Anterior Thigh Region Severe depletion  Posterior Calf Region Severe depletion  Edema (RD Assessment) None  Hair Reviewed  Eyes Unable to assess  Mouth Unable to assess  Skin Reviewed  Nails Reviewed     Diet Order:   Diet Order            DIET - DYS 1 Room service appropriate? Yes with Assist; Fluid consistency: Thin  Diet effective now                EDUCATION NEEDS:  Not appropriate for education at this time  Skin:  Skin Assessment: Reviewed RN Assessment  Last BM:  6/6 - type 4, per RN documentation  Height:  Ht Readings from Last 1 Encounters:  09/03/20 4\' 11"  (1.499 m)   Weight:  Wt Readings from Last 1 Encounters:  09/03/20 30.3 kg   Ideal Body Weight:  44.3 kg  BMI:  Body mass index is 13.49 kg/m.  Estimated Nutritional Needs:   Kcal:  1400-1600 kcal/d  Protein:  70-85 g/d  Fluid:  >1500 mL/d  09/05/20, RD, LDN Clinical Dietitian Pager on Amion

## 2020-09-12 DIAGNOSIS — E43 Unspecified severe protein-calorie malnutrition: Secondary | ICD-10-CM

## 2020-09-12 DIAGNOSIS — G40909 Epilepsy, unspecified, not intractable, without status epilepticus: Secondary | ICD-10-CM

## 2020-09-12 DIAGNOSIS — F842 Rett's syndrome: Secondary | ICD-10-CM

## 2020-09-12 DIAGNOSIS — R4189 Other symptoms and signs involving cognitive functions and awareness: Secondary | ICD-10-CM

## 2020-09-12 DIAGNOSIS — R1311 Dysphagia, oral phase: Secondary | ICD-10-CM

## 2020-09-12 DIAGNOSIS — R000 Tachycardia, unspecified: Secondary | ICD-10-CM

## 2020-09-12 NOTE — TOC Progression Note (Signed)
Transition of Care [facility]) - Progression Note    Patient Details  Name: Alicia Duncan MRN: [id number] Date of Birth: 1975-11-20  [facility] CM/SW Contact  Allayne Butcher, RN Phone Number: 09/12/2020, 10:27 AM  Clinical Narrative:    Department of Mental Health has granted a short term [facility] [id number] F start 09/12/20 end 12/11/20.  RNCM spoke with In[facility] Fermo at [facility], [facility] offered a bed and patient's brother, Genelle Bal, accepted bed offer.  Inetta Fermo reports that after speaking with Genelle Bal, she found that the patient has $44,000 in the bank, Genelle Bal has guardianship but does not have access to her bank account or SSA.  Genelle Bal will need to call SSA and open a new back account for her.  Once all the financials have been taken care of [facility] can accept patient but she will need to spend down.  Inetta Fermo will call me back and let me know once they can accept her.    Expected Discharge Plan: Skilled Nursing Facility Barriers to Discharge: Awaiting State Approval [facility])  Expected Discharge Plan and Services Expected Discharge Plan: Skilled Nursing Facility   Discharge Planning Services: CM Consult Post Acute Care Choice: Nursing Home,Skilled Nursing Facility Living arrangements for the past 2 months: Single Family Home Expected Discharge Date: 09/11/20               DME Arranged: N/A DME Agency: NA         HH Agency: NA         Social Determinants of Health (SDOH) Interventions    Readmission Risk Interventions No flowsheet data found.

## 2020-09-12 NOTE — Progress Notes (Signed)
Patient found to have an area of scarring on her medial left foot.  Scar is approximately 1x1cm and slightly raised.  Foam dressing placed on both medial feet to prevent rubbing of this area.

## 2020-09-12 NOTE — Discharge Summary (Signed)
Physician Discharge Summary  Alicia Duncan FXT:[id number] DOB: February 17, 1976 DOA: 09/03/2020  PCP: [name], MD  Admit date: 09/03/2020 Discharge date: 09/12/2020  Admitted From: Home Disposition: SNF  Recommendations for Outpatient Follow-up:  1. Follow up with PCP in 1 to 2 weeks 2. Please obtain CBC/BMP/Mag at follow up 3. Consider palliative care referral 4. Please follow up on the following pending results: None   Discharge Condition: Stable CODE STATUS: DNR/DNI Diet recommendation: Dysphagia 1 diet   Contact information for after-discharge care    Destination    [facility] Preferred SNF .   Service: Skilled Nursing Contact information: [address] [place] [address] [phone]                   Hospital Course: 45 year old F with PMH of Rett syndrome, cognitive impairment, developmental delay, nonverbal, seizure disorder and debility with complete dependence for ADLs brought to ED by EMS due to unsafe home environment.  Patient's caregiver went on vacation for several days and family at home have not been able to adequately take care of the patient.  Reportedly, patient's father who is advanced dementia.  Patient's brother with schizophrenia.  Patient is now discharged to SNF.  Discharge Diagnoses:  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 -Continue supportive care -Dysphagia 1 diet  History of seizure disorder: Stable. -Continue with Ativan, Trileptal, Gabitril.  Unintentional overdose with baclofen-reportedly high at 100 mg of baclofen on 5/30 and 5/31.  No adverse outcomes.  No mental status change. -Back on home dose now.  Anxiety/depression: Stable but difficult to assess. -Continue with fluoxetin and ativan.   Candida Vulvovaginitis -Treated with nystatin.  Tachycardia: Resolved.  Severe  Protein Malnutrition;  Body mass index is 13.49 kg/m. Nutrition Problem: Severe Malnutrition Etiology: chronic illness (Rett syndrome) Signs/Symptoms: severe muscle depletion,severe fat depletion,percent weight loss (11.6% x 6 months) Percent weight loss: 11.6 % (x 6 months) Interventions: Ensure Enlive (each supplement provides 350kcal and 20 grams of protein)      Discharge Exam: Vitals:   09/12/20 0837 09/12/20 1144  BP: 104/65 108/78  Pulse: 99 86  Resp: 18 20  Temp:    SpO2: 95% 98%    GENERAL: No apparent distress.  Nontoxic. HEENT: MMM.  Vision and hearing grossly intact.  NECK: Supple.  No apparent JVD.  RESP: On RA.  No IWOB.  Fair aeration bilaterally. CVS:  RRR. Heart sounds normal.  ABD/GI/GU: Bowel sounds present. Soft. Non tender.  MSK/EXT:  Moves extremities.  Significant muscle mass and subcu fat loss. SKIN: no apparent skin lesion or wound NEURO: Awake.  Not able to assess orientation.  No apparent acute neurodeficits.. PSYCH: Calm.  No distress or agitation.  Discharge Instructions  Discharge Instructions    Diet - low sodium heart healthy   Complete by: As directed    Increase activity slowly   Complete by: As directed      Allergies as of 09/12/2020      Reactions   Benadryl [diphenhydramine]    Penicillins Other (See Comments)   Has patient had a PCN reaction causing immediate rash, facial/tongue/throat swelling, SOB or lightheadedness with hypotension: Unknown Has patient had a PCN reaction causing severe rash involving mucus membranes or skin necrosis: Unknown Has patient had a PCN reaction that required hospitalization: Unknown Has patient had a PCN reaction occurring within the last 10 years: Unknown If all of the above answers  are "NO", then may proceed with Cephalosporin use.      Medication List    STOP taking these medications   FLUoxetine 10 MG capsule Commonly known as: PROZAC Replaced by: FLUoxetine 20 MG/5ML solution    LORazepam 1 MG tablet Commonly known as: ATIVAN Replaced by: LORazepam 2 MG/ML concentrated solution   Oxcarbazepine 300 MG tablet Commonly known as: TRILEPTAL Replaced by: OXcarbazepine 300 MG/5ML suspension     TAKE these medications   baclofen 10 MG tablet Commonly known as: LIORESAL Take 1 tablet (10 mg total) by mouth 3 (three) times daily.   docusate 50 MG/5ML liquid Commonly known as: COLACE Take 10 mLs (100 mg total) by mouth 2 (two) times daily.   FLUoxetine 20 MG/5ML solution Commonly known as: PROZAC Take 2.5 mLs (10 mg total) by mouth daily. Replaces: FLUoxetine 10 MG capsule   LORazepam 2 MG/ML concentrated solution Commonly known as: ATIVAN Take 0.5 mLs (1 mg total) by mouth 2 (two) times daily. Replaces: LORazepam 1 MG tablet   nystatin cream Commonly known as: MYCOSTATIN Apply topically 2 (two) times daily.   omeprazole 20 MG capsule Commonly known as: PRILOSEC Take 20 mg by mouth daily.   OXcarbazepine 300 MG/5ML suspension Commonly known as: TRILEPTAL Take 10 mLs (600 mg total) by mouth 2 (two) times daily. Replaces: Oxcarbazepine 300 MG tablet   tiaGABine 4 MG tablet Commonly known as: GABITRIL Take 1 tablet (4 mg total) by mouth 2 (two) times daily.       Consultations:  None  Procedures/Studies:    No results found.     The results of significant diagnostics from this hospitalization (including imaging, microbiology, ancillary and laboratory) are listed below for reference.     Microbiology: Recent Results (from the past 240 hour(s))  Resp Panel by RT-PCR (Flu A&B, Covid) Nasopharyngeal Swab     Status: None   Collection Time: 09/03/20  4:45 PM   Specimen: Nasopharyngeal Swab; Nasopharyngeal(NP) swabs in vial transport medium  Result Value Ref Range Status   SARS Coronavirus 2 by RT PCR NEGATIVE NEGATIVE Final    Comment: (NOTE) SARS-CoV-2 target nucleic acids are NOT DETECTED.  The SARS-CoV-2 RNA is generally detectable  in upper respiratory specimens during the acute phase of infection. The lowest concentration of SARS-CoV-2 viral copies this assay can detect is 138 copies/mL. A negative result does not preclude SARS-Cov-2 infection and should not be used as the sole basis for treatment or other patient management decisions. A negative result may occur with  improper specimen collection/handling, submission of specimen other than nasopharyngeal swab, presence of viral mutation(s) within the areas targeted by this assay, and inadequate number of viral copies(<138 copies/mL). A negative result must be combined with clinical observations, patient history, and epidemiological information. The expected result is Negative.  Fact Sheet for Patients:  BloggerCourse.comhttps://www.fda.gov/media/152166/download  Fact Sheet for Healthcare Providers:  SeriousBroker.ithttps://www.fda.gov/media/152162/download  This test is no t yet approved or cleared by the [place] FDA and  has been authorized for detection and/or diagnosis of SARS-CoV-2 by FDA under an Emergency Use Authorization (EUA). This EUA will remain  in effect (meaning this test can be used) for the duration of the COVID-19 declaration under Section 564(b)(1) of the Act, 21 U.S.C.section 360bbb-3(b)(1), unless the authorization is terminated  or revoked sooner.       Influenza A by PCR NEGATIVE NEGATIVE Final   Influenza B by PCR NEGATIVE NEGATIVE Final    Comment: (NOTE) The Xpert Xpress SARS-CoV-2/FLU/RSV plus assay  is intended as an aid in the diagnosis of influenza from Nasopharyngeal swab specimens and should not be used as a sole basis for treatment. Nasal washings and aspirates are unacceptable for Xpert Xpress SARS-CoV-2/FLU/RSV testing.  Fact Sheet for Patients: BloggerCourse.com  Fact Sheet for Healthcare Providers: SeriousBroker.it  This test is not yet approved or cleared by the [place] FDA and has  been authorized for detection and/or diagnosis of SARS-CoV-2 by FDA under an Emergency Use Authorization (EUA). This EUA will remain in effect (meaning this test can be used) for the duration of the COVID-19 declaration under Section 564(b)(1) of the Act, 21 U.S.C. section 360bbb-3(b)(1), unless the authorization is terminated or revoked.  Performed at [facility], [address] [place], [place] [address]   SARS CORONAVIRUS 2 (TAT 6-24 HRS) Nasopharyngeal Nasopharyngeal Swab     Status: None   Collection Time: 09/06/20  3:00 PM   Specimen: Nasopharyngeal Swab  Result Value Ref Range Status   SARS Coronavirus 2 NEGATIVE NEGATIVE Final    Comment: (NOTE) SARS-CoV-2 target nucleic acids are NOT DETECTED.  The SARS-CoV-2 RNA is generally detectable in upper and lower respiratory specimens during the acute phase of infection. Negative results do not preclude SARS-CoV-2 infection, do not rule out co-infections with other pathogens, and should not be used as the sole basis for treatment or other patient management decisions. Negative results must be combined with clinical observations, patient history, and epidemiological information. The expected result is Negative.  Fact Sheet for Patients: HairSlick.no  Fact Sheet for Healthcare Providers: quierodirigir.com  This test is not yet approved or cleared by the [place] FDA and  has been authorized for detection and/or diagnosis of SARS-CoV-2 by FDA under an Emergency Use Authorization (EUA). This EUA will remain  in effect (meaning this test can be used) for the duration of the COVID-19 declaration under Se ction 564(b)(1) of the Act, 21 U.S.C. section 360bbb-3(b)(1), unless the authorization is terminated or revoked sooner.  Performed at [facility], [address] [place], [place] [address]   Resp Panel by RT-PCR (Flu A&B, Covid) Nasopharyngeal Swab      Status: None   Collection Time: 09/10/20  9:00 AM   Specimen: Nasopharyngeal Swab; Nasopharyngeal(NP) swabs in vial transport medium  Result Value Ref Range Status   SARS Coronavirus 2 by RT PCR NEGATIVE NEGATIVE Final    Comment: (NOTE) SARS-CoV-2 target nucleic acids are NOT DETECTED.  The SARS-CoV-2 RNA is generally detectable in upper respiratory specimens during the acute phase of infection. The lowest concentration of SARS-CoV-2 viral copies this assay can detect is 138 copies/mL. A negative result does not preclude SARS-Cov-2 infection and should not be used as the sole basis for treatment or other patient management decisions. A negative result may occur with  improper specimen collection/handling, submission of specimen other than nasopharyngeal swab, presence of viral mutation(s) within the areas targeted by this assay, and inadequate number of viral copies(<138 copies/mL). A negative result must be combined with clinical observations, patient history, and epidemiological information. The expected result is Negative.  Fact Sheet for Patients:  BloggerCourse.com  Fact Sheet for Healthcare Providers:  SeriousBroker.it  This test is no t yet approved or cleared by the [place] FDA and  has been authorized for detection and/or diagnosis of SARS-CoV-2 by FDA under an Emergency Use Authorization (EUA). This EUA will remain  in effect (meaning this test can be used) for the duration of the COVID-19 declaration under Section  564(b)(1) of the Act, 21 U.S.C.section 360bbb-3(b)(1), unless the authorization is terminated  or revoked sooner.       Influenza A by PCR NEGATIVE NEGATIVE Final   Influenza B by PCR NEGATIVE NEGATIVE Final    Comment: (NOTE) The Xpert Xpress SARS-CoV-2/FLU/RSV plus assay is intended as an aid in the diagnosis of influenza from Nasopharyngeal swab specimens and should not be used as a sole basis  for treatment. Nasal washings and aspirates are unacceptable for Xpert Xpress SARS-CoV-2/FLU/RSV testing.  Fact Sheet for Patients: BloggerCourse.com  Fact Sheet for Healthcare Providers: SeriousBroker.it  This test is not yet approved or cleared by the [place] FDA and has been authorized for detection and/or diagnosis of SARS-CoV-2 by FDA under an Emergency Use Authorization (EUA). This EUA will remain in effect (meaning this test can be used) for the duration of the COVID-19 declaration under Section 564(b)(1) of the Act, 21 U.S.C. section 360bbb-3(b)(1), unless the authorization is terminated or revoked.  Performed at [facility], [address] [place], [place] [address]      Labs:  CBC: Recent Labs  Lab 09/10/20 1451  WBC 11.0*  HGB 12.7  HCT 39.5  MCV 92.3  PLT 167   BMP &GFR Recent Labs  Lab 09/08/20 0754 09/10/20 1451 09/11/20 0453  NA 132* 137 138  K 3.9 3.9 3.9  CL 100 106 107  CO2 23 25 25   GLUCOSE 84 143* 119*  BUN 22* 16 17  CREATININE 0.44 0.42* 0.38*  CALCIUM 8.2* 8.4* 8.2*   Estimated Creatinine Clearance: 42 mL/min (A) (by C-G formula based on SCr of 0.38 mg/dL (L)). Liver & Pancreas: No results for input(s): AST, ALT, ALKPHOS, BILITOT, PROT, ALBUMIN in the last 168 hours. No results for input(s): LIPASE, AMYLASE in the last 168 hours. No results for input(s): AMMONIA in the last 168 hours. Diabetic: No results for input(s): HGBA1C in the last 72 hours. No results for input(s): GLUCAP in the last 168 hours. Cardiac Enzymes: No results for input(s): CKTOTAL, CKMB, CKMBINDEX, TROPONINI in the last 168 hours. No results for input(s): PROBNP in the last 8760 hours. Coagulation Profile: No results for input(s): INR, PROTIME in the last 168 hours. Thyroid Function Tests: No results for input(s): TSH, T4TOTAL, FREET4, T3FREE, THYROIDAB in the last 72 hours. Lipid Profile: No  results for input(s): CHOL, HDL, LDLCALC, TRIG, CHOLHDL, LDLDIRECT in the last 72 hours. Anemia Panel: No results for input(s): VITAMINB12, FOLATE, FERRITIN, TIBC, IRON, RETICCTPCT in the last 72 hours. Urine analysis:    Component Value Date/Time   COLORURINE YELLOW (A) 07/16/2020 1728   APPEARANCEUR CLOUDY (A) 07/16/2020 1728   LABSPEC 1.023 07/16/2020 1728   PHURINE 8.0 07/16/2020 1728   GLUCOSEU NEGATIVE 07/16/2020 1728   HGBUR NEGATIVE 07/16/2020 1728   BILIRUBINUR NEGATIVE 07/16/2020 1728   KETONESUR 5 (A) 07/16/2020 1728   PROTEINUR NEGATIVE 07/16/2020 1728   UROBILINOGEN 1.0 04/25/2013 1427   NITRITE NEGATIVE 07/16/2020 1728   LEUKOCYTESUR NEGATIVE 07/16/2020 1728   Sepsis Labs: Invalid input(s): PROCALCITONIN, LACTICIDVEN   Time coordinating discharge: 35 minutes  SIGNED:  09/15/2020, MD  [facility] 09/12/2020, 11:47 AM  If 7PM-7AM, please contact night-coverage [email or web address]

## 2020-09-13 ENCOUNTER — Ambulatory Visit: Payer: Medicare Other | Admitting: Neurology

## 2020-09-13 NOTE — Progress Notes (Signed)
PROGRESS NOTE  Alicia Duncan MWU:[id number] DOB: 1975-08-30   PCP: [name], MD  Patient is from: Home with home care.   Totally dependent for ADLs at baseline.  DOA: 09/03/2020 LOS: 8  Chief complaints:  Chief Complaint  Patient presents with   social work    Brief Narrative / Interim history: 45 year old F with PMH of Rett syndrome, cognitive impairment, developmental delay, nonverbal, seizure disorder and debility with complete dependence for ADLs brought to ED by EMS due to unsafe home environment.  Patient's caregiver went on vacation for several days and family at home have not been able to adequately take care of the patient.  Reportedly, patient's father who is advanced dementia.  Patient's brother with schizophrenia.  Patient is medically stable but has no safe disposition yet.   Subjective: Seen and examined earlier this morning.  No major events overnight of this morning.  Patient is nonverbal.  She does not appear to be in distress.  Objective: Vitals:   09/13/20 0555 09/13/20 0601 09/13/20 0826 09/13/20 1156  BP:   104/62 93/61  Pulse: 80 85 72 92  Resp:   17 16  Temp:   98.3 F (36.8 C) (!) 97.5 F (36.4 C)  TempSrc:    Oral  SpO2: 98% 96% 96% 97%  Weight:      Height:        Intake/Output Summary (Last 24 hours) at 09/13/2020 1404 Last data filed at 09/12/2020 1836 Gross per 24 hour  Intake 30 ml  Output --  Net 30 ml   Filed Weights   09/03/20 1613 09/03/20 2240  Weight: 40.8 kg 30.3 kg    Examination:  GENERAL: Appears frail.  No apparent distress. HEENT: MMM.  Vision and hearing grossly intact.  NECK: Supple.  No apparent JVD.  RESP:  No IWOB.  Fair aeration bilaterally. CVS:  RRR. Heart sounds normal.  ABD/GI/GU: BS+. Abd soft, NTND.  MSK/EXT:  Moves extremities.  Significant muscle mass and subcu fat loss. SKIN: no apparent skin lesion or wound NEURO: Sleepy but wakes to voice easily.  Does not follows command.  No apparent focal neuro  deficit. PSYCH: Calm. Normal affect  Procedures:  None  Microbiology summarized: COVID-19 and influenza PCR nonreactive.  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 -Continue supportive care -Continue dysphagia 1 diet -Aspiration precautions   History of seizure disorder: Stable. -Continue with Ativan, Trileptal, Gabitril.   Unintentional overdose with baclofen-reportedly high at 100 mg of baclofen on 5/30 and 5/31.  No adverse outcomes.  No mental status change. -Back on home dose baclofen now.   Anxiety/depression: Stable but difficult to assess. -Continue with fluoxetine and ativan.   Candida Vulvovaginitis -Treated with nystatin.   Tachycardia: Resolved.   Severe Protein Malnutrition;  Body mass index is 13.49 kg/m. Nutrition Problem: Severe Malnutrition Etiology: chronic illness (Rett syndrome) Signs/Symptoms: severe muscle depletion, severe fat depletion, percent weight loss (11.6% x 6 months) Percent weight loss: 11.6 % (x 6 months) Interventions: Ensure Enlive (each supplement provides 350kcal and 20 grams of protein)   DVT prophylaxis:  enoxaparin (LOVENOX) injection 30 mg Start: 09/03/20 2200  Code Status: DNR/DNI Family Communication: Patient and/or RN. Available if any question.  Level of care: Med-Surg Status is: Inpatient  Remains inpatient appropriate because:Unsafe d/c plan  Dispo: The patient is from: Home              Anticipated d/c  is to:  To be determined              Patient currently is medically stable to d/c.   Difficult to place patient Yes       Consultants:  None   Sch Meds:  Scheduled Meds:  baclofen  10 mg Oral TID   docusate  100 mg Oral BID   enoxaparin (LOVENOX) injection  30 mg Subcutaneous Q24H   feeding supplement  237 mL Oral TID BM   FLUoxetine  10 mg Oral Daily   LORazepam  1 mg Oral BID    nystatin cream   Topical BID   OXcarbazepine  600 mg Oral BID   tiaGABine  4 mg Oral BID   Continuous Infusions:  sodium chloride 75 mL/hr at 09/11/20 0543   PRN Meds:.acetaminophen **OR** acetaminophen, ondansetron **OR** ondansetron (ZOFRAN) IV  Antimicrobials: Anti-infectives (From admission, onward)    None        I have personally reviewed the following labs and images: CBC: Recent Labs  Lab 09/10/20 1451  WBC 11.0*  HGB 12.7  HCT 39.5  MCV 92.3  PLT 167   BMP &GFR Recent Labs  Lab 09/08/20 0754 09/10/20 1451 09/11/20 0453  NA 132* 137 138  K 3.9 3.9 3.9  CL 100 106 107  CO2 23 25 25   GLUCOSE 84 143* 119*  BUN 22* 16 17  CREATININE 0.44 0.42* 0.38*  CALCIUM 8.2* 8.4* 8.2*   Estimated Creatinine Clearance: 42 mL/min (A) (by C-G formula based on SCr of 0.38 mg/dL (L)). Liver & Pancreas: No results for input(s): AST, ALT, ALKPHOS, BILITOT, PROT, ALBUMIN in the last 168 hours. No results for input(s): LIPASE, AMYLASE in the last 168 hours. No results for input(s): AMMONIA in the last 168 hours. Diabetic: No results for input(s): HGBA1C in the last 72 hours. No results for input(s): GLUCAP in the last 168 hours. Cardiac Enzymes: No results for input(s): CKTOTAL, CKMB, CKMBINDEX, TROPONINI in the last 168 hours. No results for input(s): PROBNP in the last 8760 hours. Coagulation Profile: No results for input(s): INR, PROTIME in the last 168 hours. Thyroid Function Tests: No results for input(s): TSH, T4TOTAL, FREET4, T3FREE, THYROIDAB in the last 72 hours. Lipid Profile: No results for input(s): CHOL, HDL, LDLCALC, TRIG, CHOLHDL, LDLDIRECT in the last 72 hours. Anemia Panel: No results for input(s): VITAMINB12, FOLATE, FERRITIN, TIBC, IRON, RETICCTPCT in the last 72 hours. Urine analysis:    Component Value Date/Time   COLORURINE YELLOW (A) 07/16/2020 1728   APPEARANCEUR CLOUDY (A) 07/16/2020 1728   LABSPEC 1.023 07/16/2020 1728   PHURINE 8.0  07/16/2020 1728   GLUCOSEU NEGATIVE 07/16/2020 1728   HGBUR NEGATIVE 07/16/2020 1728   BILIRUBINUR NEGATIVE 07/16/2020 1728   KETONESUR 5 (A) 07/16/2020 1728   PROTEINUR NEGATIVE 07/16/2020 1728   UROBILINOGEN 1.0 04/25/2013 1427   NITRITE NEGATIVE 07/16/2020 1728   LEUKOCYTESUR NEGATIVE 07/16/2020 1728   Sepsis Labs: Invalid input(s): PROCALCITONIN, LACTICIDVEN  Microbiology: Recent Results (from the past 240 hour(s))  Resp Panel by RT-PCR (Flu A&B, Covid) Nasopharyngeal Swab     Status: None   Collection Time: 09/03/20  4:45 PM   Specimen: Nasopharyngeal Swab; Nasopharyngeal(NP) swabs in vial transport medium  Result Value Ref Range Status   SARS Coronavirus 2 by RT PCR NEGATIVE NEGATIVE Final    Comment: (NOTE) SARS-CoV-2 target nucleic acids are NOT DETECTED.  The SARS-CoV-2 RNA is generally detectable in upper respiratory specimens during the acute phase of infection. The  lowest concentration of SARS-CoV-2 viral copies this assay can detect is 138 copies/mL. A negative result does not preclude SARS-Cov-2 infection and should not be used as the sole basis for treatment or other patient management decisions. A negative result may occur with  improper specimen collection/handling, submission of specimen other than nasopharyngeal swab, presence of viral mutation(s) within the areas targeted by this assay, and inadequate number of viral copies(<138 copies/mL). A negative result must be combined with clinical observations, patient history, and epidemiological information. The expected result is Negative.  Fact Sheet for Patients:  BloggerCourse.comhttps://www.fda.gov/media/152166/download  Fact Sheet for Healthcare Providers:  SeriousBroker.ithttps://www.fda.gov/media/152162/download  This test is no t yet approved or cleared by the [place] FDA and  has been authorized for detection and/or diagnosis of SARS-CoV-2 by FDA under an Emergency Use Authorization (EUA). This EUA will remain  in effect  (meaning this test can be used) for the duration of the COVID-19 declaration under Section 564(b)(1) of the Act, 21 U.S.C.section 360bbb-3(b)(1), unless the authorization is terminated  or revoked sooner.       Influenza A by PCR NEGATIVE NEGATIVE Final   Influenza B by PCR NEGATIVE NEGATIVE Final    Comment: (NOTE) The Xpert Xpress SARS-CoV-2/FLU/RSV plus assay is intended as an aid in the diagnosis of influenza from Nasopharyngeal swab specimens and should not be used as a sole basis for treatment. Nasal washings and aspirates are unacceptable for Xpert Xpress SARS-CoV-2/FLU/RSV testing.  Fact Sheet for Patients: BloggerCourse.comhttps://www.fda.gov/media/152166/download  Fact Sheet for Healthcare Providers: SeriousBroker.ithttps://www.fda.gov/media/152162/download  This test is not yet approved or cleared by the [place] FDA and has been authorized for detection and/or diagnosis of SARS-CoV-2 by FDA under an Emergency Use Authorization (EUA). This EUA will remain in effect (meaning this test can be used) for the duration of the COVID-19 declaration under Section 564(b)(1) of the Act, 21 U.S.C. section 360bbb-3(b)(1), unless the authorization is terminated or revoked.  Performed at [facility], [address] [place], [place] [address]   SARS CORONAVIRUS 2 (TAT 6-24 HRS) Nasopharyngeal Nasopharyngeal Swab     Status: None   Collection Time: 09/06/20  3:00 PM   Specimen: Nasopharyngeal Swab  Result Value Ref Range Status   SARS Coronavirus 2 NEGATIVE NEGATIVE Final    Comment: (NOTE) SARS-CoV-2 target nucleic acids are NOT DETECTED.  The SARS-CoV-2 RNA is generally detectable in upper and lower respiratory specimens during the acute phase of infection. Negative results do not preclude SARS-CoV-2 infection, do not rule out co-infections with other pathogens, and should not be used as the sole basis for treatment or other patient management decisions. Negative results must be combined  with clinical observations, patient history, and epidemiological information. The expected result is Negative.  Fact Sheet for Patients: HairSlick.nohttps://www.fda.gov/media/138098/download  Fact Sheet for Healthcare Providers: quierodirigir.comhttps://www.fda.gov/media/138095/download  This test is not yet approved or cleared by the [place] FDA and  has been authorized for detection and/or diagnosis of SARS-CoV-2 by FDA under an Emergency Use Authorization (EUA). This EUA will remain  in effect (meaning this test can be used) for the duration of the COVID-19 declaration under Se ction 564(b)(1) of the Act, 21 U.S.C. section 360bbb-3(b)(1), unless the authorization is terminated or revoked sooner.  Performed at [facility], [address] [place], [place] [address]   Resp Panel by RT-PCR (Flu A&B, Covid) Nasopharyngeal Swab     Status: None   Collection Time: 09/10/20  9:00 AM   Specimen: Nasopharyngeal Swab; Nasopharyngeal(NP) swabs in vial  transport medium  Result Value Ref Range Status   SARS Coronavirus 2 by RT PCR NEGATIVE NEGATIVE Final    Comment: (NOTE) SARS-CoV-2 target nucleic acids are NOT DETECTED.  The SARS-CoV-2 RNA is generally detectable in upper respiratory specimens during the acute phase of infection. The lowest concentration of SARS-CoV-2 viral copies this assay can detect is 138 copies/mL. A negative result does not preclude SARS-Cov-2 infection and should not be used as the sole basis for treatment or other patient management decisions. A negative result may occur with  improper specimen collection/handling, submission of specimen other than nasopharyngeal swab, presence of viral mutation(s) within the areas targeted by this assay, and inadequate number of viral copies(<138 copies/mL). A negative result must be combined with clinical observations, patient history, and epidemiological information. The expected result is Negative.  Fact Sheet for Patients:   BloggerCourse.com  Fact Sheet for Healthcare Providers:  SeriousBroker.it  This test is no t yet approved or cleared by the [place] FDA and  has been authorized for detection and/or diagnosis of SARS-CoV-2 by FDA under an Emergency Use Authorization (EUA). This EUA will remain  in effect (meaning this test can be used) for the duration of the COVID-19 declaration under Section 564(b)(1) of the Act, 21 U.S.C.section 360bbb-3(b)(1), unless the authorization is terminated  or revoked sooner.       Influenza A by PCR NEGATIVE NEGATIVE Final   Influenza B by PCR NEGATIVE NEGATIVE Final    Comment: (NOTE) The Xpert Xpress SARS-CoV-2/FLU/RSV plus assay is intended as an aid in the diagnosis of influenza from Nasopharyngeal swab specimens and should not be used as a sole basis for treatment. Nasal washings and aspirates are unacceptable for Xpert Xpress SARS-CoV-2/FLU/RSV testing.  Fact Sheet for Patients: BloggerCourse.com  Fact Sheet for Healthcare Providers: SeriousBroker.it  This test is not yet approved or cleared by the [place] FDA and has been authorized for detection and/or diagnosis of SARS-CoV-2 by FDA under an Emergency Use Authorization (EUA). This EUA will remain in effect (meaning this test can be used) for the duration of the COVID-19 declaration under Section 564(b)(1) of the Act, 21 U.S.C. section 360bbb-3(b)(1), unless the authorization is terminated or revoked.  Performed at [facility], [address], [place], [place] [address]     Radiology Studies: No results found.    Thamas Appleyard T. Ruffin Lada [facility]  If 7PM-7AM, please contact night-coverage [email or web address] 09/13/2020, 2:04 PM

## 2020-09-13 NOTE — TOC Progression Note (Addendum)
Transition of Care [facility]) - Progression Note    Patient Details  Name: Alicia Duncan MRN: [id number] Date of Birth: 1975-05-25  [facility] CM/SW Contact  Allayne Butcher, RN Phone Number: 09/13/2020, 11:07 AM  Clinical Narrative:    Patient has a care manager with [facility], her name is [name].  [name] reports that SNF is not the appropriate setting for the patient.  Patient is part of a waiver that covers all of her medical care and supplies needed.  Patient requires a specialized care home.  [name] has been working on arranging this type of setting for patient and is following up with care home today to see when they will have a bed available.    Expected Discharge Plan: Group Home Barriers to Discharge: Other (must enter comment) (patient needs a specialized care home placement)  Expected Discharge Plan and Services Expected Discharge Plan: Group Home   Discharge Planning Services: CM Consult Post Acute Care Choice: Nursing Home Living arrangements for the past 2 months: Single Family Home Expected Discharge Date: 09/13/20               DME Arranged: N/A DME Agency: NA         HH Agency: NA         Social Determinants of Health (SDOH) Interventions    Readmission Risk Interventions No flowsheet data found.

## 2020-09-13 NOTE — Care Management Important Message (Signed)
Important Message  Patient Details  Name: Alicia Duncan MRN: [id number] Date of Birth: 01/24/1976   Medicare Important Message Given:  Other (see comment)  Called her legal guardian, [name], brother [phone]) to review the Important Message from Medicare again but had a leave a message.  Asked that if he didn't reach me this afternoon to call my co-worker, [place] at [phone] on Friday.  [name] A December Hedtke 09/13/2020, 3:15 PM

## 2020-09-13 NOTE — Care Management Important Message (Signed)
Important Message  Patient Details  Name: Alicia Duncan MRN: [id number] Date of Birth: Jun 21, 1975   Medicare Important Message Given:  Yes  Bertell Maria, legal guardian returned my call and is in agreement with the upcoming discharge that staff from [facility] is working on.   [name] A Kabir Brannock 09/13/2020, 3:58 PM

## 2020-09-14 NOTE — Progress Notes (Signed)
PROGRESS NOTE  Alicia Duncan TDV:[id number] DOB: 1975/11/21   PCP: [name], MD  Patient is from: Home with home care.   Totally dependent for ADLs at baseline.  DOA: 09/03/2020 LOS: 9  Chief complaints:  Chief Complaint  Patient presents with   social work    Brief Narrative / Interim history: 45 year old F with PMH of Rett syndrome, cognitive impairment, developmental delay, nonverbal, seizure disorder and debility with complete dependence for ADLs brought to ED by EMS due to unsafe home environment.  Patient's caregiver went on vacation for several days and family at home have not been able to adequately take care of the patient.  Reportedly, patient's father who is advanced dementia.  Patient's brother with schizophrenia.  Patient is medically stable but has no safe disposition yet.   Subjective: Seen and examined earlier this morning.  No major events overnight of this morning.  Patient is nonverbal.  She does not appear to be in distress.  Objective: Vitals:   09/14/20 0033 09/14/20 0339 09/14/20 0739 09/14/20 1117  BP: 114/74 120/70 111/73 96/62  Pulse: 98 93 92 91  Resp: 18 16 20 20   Temp: 97.9 F (36.6 C) (!) 97.5 F (36.4 C) 98.6 F (37 C) 98.7 F (37.1 C)  TempSrc: Oral Oral Axillary Axillary  SpO2: 94% 94% 98% 94%  Weight:      Height:        Intake/Output Summary (Last 24 hours) at 09/14/2020 1410 Last data filed at 09/13/2020 1700 Gross per 24 hour  Intake --  Output 1 ml  Net -1 ml   Filed Weights   09/03/20 1613 09/03/20 2240  Weight: 40.8 kg 30.3 kg    Examination:  GENERAL: Appears frail.  No apparent distress. HEENT: MMM.  Vision and hearing grossly intact.  NECK: Supple.  No apparent JVD.  RESP: On RA.  No IWOB.  Fair aeration bilaterally. CVS:  RRR. Heart sounds normal.  ABD/GI/GU: BS+. Abd soft, NTND.  MSK/EXT:  Moves extremities.  Significant muscle mass and subcu fat loss. SKIN: no apparent skin lesion or wound NEURO: Sleepy but  wakes to voice easily.  Nonverbal.  Does not follows command.  PSYCH: Calm.  No distress or agitation.  Procedures:  None  Microbiology summarized: COVID-19 and influenza PCR nonreactive.  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 -Continue supportive care, dysphagia 1 diet and aspiration precautions   History of seizure disorder: Stable. -Continue with Ativan, Trileptal, Gabitril.   Unintentional overdose with baclofen-reportedly high at 100 mg of baclofen on 5/30 and 5/31.  No adverse outcomes.  No mental status change. -Continue home baclofen.   Anxiety/depression: Stable but difficult to assess. -Continue with fluoxetine and ativan.   Candida Vulvovaginitis -Treated with nystatin.   Tachycardia: Resolved.   Severe Protein Malnutrition;  Body mass index is 13.49 kg/m. Nutrition Problem: Severe Malnutrition Etiology: chronic illness (Rett syndrome) Signs/Symptoms: severe muscle depletion, severe fat depletion, percent weight loss (11.6% x 6 months) Percent weight loss: 11.6 % (x 6 months) Interventions: Ensure Enlive (each supplement provides 350kcal and 20 grams of protein)   DVT prophylaxis:  enoxaparin (LOVENOX) injection 30 mg Start: 09/03/20 2200  Code Status: DNR/DNI Family Communication: Patient and/or RN. Available if any question.  Level of care: Med-Surg Status is: Inpatient  Remains inpatient appropriate because:Unsafe d/c plan  Dispo: The patient is from: Home  Anticipated d/c is to:  To be determined              Patient currently is medically stable to d/c.   Difficult to place patient Yes       Consultants:  None   Sch Meds:  Scheduled Meds:  baclofen  10 mg Oral TID   docusate  100 mg Oral BID   enoxaparin (LOVENOX) injection  30 mg Subcutaneous Q24H   feeding supplement  237 mL Oral TID BM    FLUoxetine  10 mg Oral Daily   LORazepam  1 mg Oral BID   nystatin cream   Topical BID   OXcarbazepine  600 mg Oral BID   tiaGABine  4 mg Oral BID   Continuous Infusions:  sodium chloride 75 mL/hr at 09/14/20 0541   PRN Meds:.acetaminophen **OR** acetaminophen, ondansetron **OR** ondansetron (ZOFRAN) IV  Antimicrobials: Anti-infectives (From admission, onward)    None        I have personally reviewed the following labs and images: CBC: Recent Labs  Lab 09/10/20 1451  WBC 11.0*  HGB 12.7  HCT 39.5  MCV 92.3  PLT 167   BMP &GFR Recent Labs  Lab 09/08/20 0754 09/10/20 1451 09/11/20 0453  NA 132* 137 138  K 3.9 3.9 3.9  CL 100 106 107  CO2 23 25 25   GLUCOSE 84 143* 119*  BUN 22* 16 17  CREATININE 0.44 0.42* 0.38*  CALCIUM 8.2* 8.4* 8.2*   Estimated Creatinine Clearance: 42 mL/min (A) (by C-G formula based on SCr of 0.38 mg/dL (L)). Liver & Pancreas: No results for input(s): AST, ALT, ALKPHOS, BILITOT, PROT, ALBUMIN in the last 168 hours. No results for input(s): LIPASE, AMYLASE in the last 168 hours. No results for input(s): AMMONIA in the last 168 hours. Diabetic: No results for input(s): HGBA1C in the last 72 hours. No results for input(s): GLUCAP in the last 168 hours. Cardiac Enzymes: No results for input(s): CKTOTAL, CKMB, CKMBINDEX, TROPONINI in the last 168 hours. No results for input(s): PROBNP in the last 8760 hours. Coagulation Profile: No results for input(s): INR, PROTIME in the last 168 hours. Thyroid Function Tests: No results for input(s): TSH, T4TOTAL, FREET4, T3FREE, THYROIDAB in the last 72 hours. Lipid Profile: No results for input(s): CHOL, HDL, LDLCALC, TRIG, CHOLHDL, LDLDIRECT in the last 72 hours. Anemia Panel: No results for input(s): VITAMINB12, FOLATE, FERRITIN, TIBC, IRON, RETICCTPCT in the last 72 hours. Urine analysis:    Component Value Date/Time   COLORURINE YELLOW (A) 07/16/2020 1728   APPEARANCEUR CLOUDY (A) 07/16/2020  1728   LABSPEC 1.023 07/16/2020 1728   PHURINE 8.0 07/16/2020 1728   GLUCOSEU NEGATIVE 07/16/2020 1728   HGBUR NEGATIVE 07/16/2020 1728   BILIRUBINUR NEGATIVE 07/16/2020 1728   KETONESUR 5 (A) 07/16/2020 1728   PROTEINUR NEGATIVE 07/16/2020 1728   UROBILINOGEN 1.0 04/25/2013 1427   NITRITE NEGATIVE 07/16/2020 1728   LEUKOCYTESUR NEGATIVE 07/16/2020 1728   Sepsis Labs: Invalid input(s): PROCALCITONIN, LACTICIDVEN  Microbiology: Recent Results (from the past 240 hour(s))  SARS CORONAVIRUS 2 (TAT 6-24 HRS) Nasopharyngeal Nasopharyngeal Swab     Status: None   Collection Time: 09/06/20  3:00 PM   Specimen: Nasopharyngeal Swab  Result Value Ref Range Status   SARS Coronavirus 2 NEGATIVE NEGATIVE Final    Comment: (NOTE) SARS-CoV-2 target nucleic acids are NOT DETECTED.  The SARS-CoV-2 RNA is generally detectable in upper and lower respiratory specimens during the acute phase of infection. Negative results do not preclude SARS-CoV-2  infection, do not rule out co-infections with other pathogens, and should not be used as the sole basis for treatment or other patient management decisions. Negative results must be combined with clinical observations, patient history, and epidemiological information. The expected result is Negative.  Fact Sheet for Patients: HairSlick.no  Fact Sheet for Healthcare Providers: quierodirigir.com  This test is not yet approved or cleared by the [place] FDA and  has been authorized for detection and/or diagnosis of SARS-CoV-2 by FDA under an Emergency Use Authorization (EUA). This EUA will remain  in effect (meaning this test can be used) for the duration of the COVID-19 declaration under Se ction 564(b)(1) of the Act, 21 U.S.C. section 360bbb-3(b)(1), unless the authorization is terminated or revoked sooner.  Performed at [facility], [address], [place], [place] [address]    Resp Panel by RT-PCR (Flu A&B, Covid) Nasopharyngeal Swab     Status: None   Collection Time: 09/10/20  9:00 AM   Specimen: Nasopharyngeal Swab; Nasopharyngeal(NP) swabs in vial transport medium  Result Value Ref Range Status   SARS Coronavirus 2 by RT PCR NEGATIVE NEGATIVE Final    Comment: (NOTE) SARS-CoV-2 target nucleic acids are NOT DETECTED.  The SARS-CoV-2 RNA is generally detectable in upper respiratory specimens during the acute phase of infection. The lowest concentration of SARS-CoV-2 viral copies this assay can detect is 138 copies/mL. A negative result does not preclude SARS-Cov-2 infection and should not be used as the sole basis for treatment or other patient management decisions. A negative result may occur with  improper specimen collection/handling, submission of specimen other than nasopharyngeal swab, presence of viral mutation(s) within the areas targeted by this assay, and inadequate number of viral copies(<138 copies/mL). A negative result must be combined with clinical observations, patient history, and epidemiological information. The expected result is Negative.  Fact Sheet for Patients:  BloggerCourse.com  Fact Sheet for Healthcare Providers:  SeriousBroker.it  This test is no t yet approved or cleared by the [place] FDA and  has been authorized for detection and/or diagnosis of SARS-CoV-2 by FDA under an Emergency Use Authorization (EUA). This EUA will remain  in effect (meaning this test can be used) for the duration of the COVID-19 declaration under Section 564(b)(1) of the Act, 21 U.S.C.section 360bbb-3(b)(1), unless the authorization is terminated  or revoked sooner.       Influenza A by PCR NEGATIVE NEGATIVE Final   Influenza B by PCR NEGATIVE NEGATIVE Final    Comment: (NOTE) The Xpert Xpress SARS-CoV-2/FLU/RSV plus assay is intended as an aid in the diagnosis of influenza from  Nasopharyngeal swab specimens and should not be used as a sole basis for treatment. Nasal washings and aspirates are unacceptable for Xpert Xpress SARS-CoV-2/FLU/RSV testing.  Fact Sheet for Patients: BloggerCourse.com  Fact Sheet for Healthcare Providers: SeriousBroker.it  This test is not yet approved or cleared by the [place] FDA and has been authorized for detection and/or diagnosis of SARS-CoV-2 by FDA under an Emergency Use Authorization (EUA). This EUA will remain in effect (meaning this test can be used) for the duration of the COVID-19 declaration under Section 564(b)(1) of the Act, 21 U.S.C. section 360bbb-3(b)(1), unless the authorization is terminated or revoked.  Performed at [facility], [address], [place], [place] [address]     Radiology Studies: No results found.    Jaimes Eckert T. Marillyn Goren [facility]  If 7PM-7AM, please contact night-coverage [email or web address] 09/14/2020, 2:10 PM

## 2020-09-14 NOTE — Progress Notes (Signed)
Patient found on hands and knees on the floor at the end of her bed.  Upon entering patient's room, patient reached her arms up and pulled herself up to standing position holding onto my arms for support.  Patient was assisted back to bed and assessed for injuries.  No injuries found.  Patient cleaned and with new bedding.  Chair alarm in place under patient.  MD notified of unwitnessed fall.

## 2020-09-14 NOTE — Progress Notes (Signed)
Patient brother Christin Moline was called about unwitnessed fall, told him patient found on knees next to bed no injuries found, and no pain from patient noted.  Told him MD was notified.  He said his sister has a tendency to wiggle her way out of bed and thanks [place] for letting him know.  Told him, we would continue to monitor patient closely for any changes.

## 2020-09-15 NOTE — Progress Notes (Signed)
Daughter, Eunice Blase called. Updated and set up password.

## 2020-09-15 NOTE — Plan of Care (Signed)

## 2020-09-15 NOTE — Progress Notes (Signed)
PT Cancellation Note  Patient Details Name: Alicia Duncan MRN: [id number] DOB: 1975-05-30   Cancelled Treatment:    Reason Eval/Treat Not Completed: Other (comment) New PT orders received. Spoke with MD & no acute changes to pt's baseline level of function. Per chart, pt previously screened by PT & found to be at baseline level of function (dependent with 24 hour caregivers) with no acute PT needs at this time. PT will SIGN OFF. Thank you.  Aleda Grana, PT, DPT 09/15/20, 8:12 AM    Sandi Mariscal 09/15/2020, 8:11 AM

## 2020-09-15 NOTE — Progress Notes (Signed)
OT Cancellation Note  Patient Details Name: Alicia Duncan MRN: [id number] DOB: 09-03-1975   Cancelled Treatment:    Reason Eval/Treat Not Completed: OT screened, no needs identified, will sign off Extensive chart review completed including therapy evaluations performed last year. Review indicates that Pt is bedfast at baseline and requires TOTAL assistance for ADLs. Pt's current level of function appears to be her baseline and acute occupational therapy does not appear warranted at this time. Thank you.  Rejeana Brock, MS, OTR/L ascom [phone] 09/15/20, 8:28 AM

## 2020-09-15 NOTE — Progress Notes (Signed)
PROGRESS NOTE  Alicia Duncan OIB:[id number] DOB: [birth year]   PCP: [name], MD  Patient is from: Home with home care.   Totally dependent for ADLs at baseline.  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  Patient's caregiver went on vacation for several days and family at home have not been able to adequately take care of the patient.  Reportedly, patient's father who is advanced dementia.  Patient's brother with schizophrenia.  Patient is medically stable but has no safe disposition yet.   Subjective: Seen and examined earlier this morning.  No major events overnight of this morning.  Patient is nonverbal.  Does not appear to be in distress.  Patient's father at bedside but seems to have advanced dementia.  Objective: Vitals:   09/15/20 0024 09/15/20 0327 09/15/20 0916 09/15/20 1123  BP: 124/70 123/71 117/75 116/76  Pulse: 90 82 84 91  Resp: 16  16 17   Temp: 98 F (36.7 C) (!) 97.3 F (36.3 C) 98 F (36.7 C) 97.9 F (36.6 C)  TempSrc: Oral Oral    SpO2:   98% 100%  Weight:      Height:        Intake/Output Summary (Last 24 hours) at 09/15/2020 1524 Last data filed at 09/15/2020 1351 Gross per 24 hour  Intake 120 ml  Output --  Net 120 ml   Filed Weights   09/03/20 1613 09/03/20 2240  Weight: 40.8 kg 30.3 kg    Examination:  GENERAL: Appears frail.  No apparent distress. HEENT: MMM.  Vision and hearing grossly intact.  NECK: Supple.  No apparent JVD.  RESP: On RA.  No IWOB.  Fair aeration bilaterally. CVS:  RRR. Heart sounds normal.  ABD/GI/GU: BS+. Abd soft, NTND.  MSK/EXT:  Moves extremities.  Significant muscle mass and subcu fat loss. SKIN: no  apparent skin lesion or wound NEURO: Awake and alert.  Nonverbal.  Does not follows command.  PSYCH: No distress or agitation.  Procedures:  None  Microbiology summarized: COVID-19 and influenza PCR nonreactive.  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 -Continue supportive care, dysphagia 1 diet and aspiration precautions  Unwitnessed fall in the hospital-patient was found on her knees next to the bed on 09/14/2020.  No injuries.  No changes mental status.  Patient's brother notified.  Per her brother, patient has a tendency to wiggle her way out of bed. -Fall precaution.   History of seizure disorder: Stable. -Continue with Ativan, Trileptal, Gabitril.   Unintentional overdose with baclofen-reportedly high at 100 mg of baclofen on 5/30 and 5/31.  No adverse outcomes.  No mental status change. -Continue home baclofen.   Anxiety/depression: Stable but difficult to assess. -Continue with fluoxetine and ativan.   Candida Vulvovaginitis -Treated with nystatin.   Tachycardia: Resolved.   Severe Protein Malnutrition;  Body mass index is 13.49 kg/m. Nutrition Problem: Severe Malnutrition Etiology: chronic illness (Rett syndrome) Signs/Symptoms: severe muscle depletion, severe fat depletion, percent weight loss (11.6% x 6 months) Percent weight loss: 11.6 % (x 6 months) Interventions: Ensure Enlive (each supplement provides 350kcal and 20 grams of protein)   DVT prophylaxis:  enoxaparin (LOVENOX)  injection 30 mg Start: 09/03/20 2200  Code Status: DNR/DNI Family Communication: Patient and/or RN.  Updated patient's father at bedside but he has advanced dementia.  Level of care: Med-Surg Status is: Inpatient  Remains inpatient appropriate because:Unsafe d/c plan  Dispo: The patient is from: Home              Anticipated d/c is to:  To be determined               Patient currently is medically stable to d/c.   Difficult to place patient Yes       Consultants:  None   Sch Meds:  Scheduled Meds:  baclofen  10 mg Oral TID   docusate  100 mg Oral BID   enoxaparin (LOVENOX) injection  30 mg Subcutaneous Q24H   feeding supplement  237 mL Oral TID BM   FLUoxetine  10 mg Oral Daily   LORazepam  1 mg Oral BID   nystatin cream   Topical BID   OXcarbazepine  600 mg Oral BID   tiaGABine  4 mg Oral BID   Continuous Infusions:  sodium chloride 75 mL/hr at 09/15/20 1059   PRN Meds:.acetaminophen **OR** acetaminophen, ondansetron **OR** ondansetron (ZOFRAN) IV  Antimicrobials: Anti-infectives (From admission, onward)    None        I have personally reviewed the following labs and images: CBC: Recent Labs  Lab 09/10/20 1451  WBC 11.0*  HGB 12.7  HCT 39.5  MCV 92.3  PLT 167   BMP &GFR Recent Labs  Lab 09/10/20 1451 09/11/20 0453  NA 137 138  K 3.9 3.9  CL 106 107  CO2 25 25  GLUCOSE 143* 119*  BUN 16 17  CREATININE 0.42* 0.38*  CALCIUM 8.4* 8.2*   Estimated Creatinine Clearance: 42 mL/min (A) (by C-G formula based on SCr of 0.38 mg/dL (L)). Liver & Pancreas: No results for input(s): AST, ALT, ALKPHOS, BILITOT, PROT, ALBUMIN in the last 168 hours. No results for input(s): LIPASE, AMYLASE in the last 168 hours. No results for input(s): AMMONIA in the last 168 hours. Diabetic: No results for input(s): HGBA1C in the last 72 hours. No results for input(s): GLUCAP in the last 168 hours. Cardiac Enzymes: No results for input(s): CKTOTAL, CKMB, CKMBINDEX, TROPONINI in the last 168 hours. No results for input(s): PROBNP in the last 8760 hours. Coagulation Profile: No results for input(s): INR, PROTIME in the last 168 hours. Thyroid Function Tests: No results for input(s): TSH, T4TOTAL, FREET4, T3FREE, THYROIDAB in the last 72 hours. Lipid Profile: No results for input(s): CHOL, HDL, LDLCALC, TRIG, CHOLHDL,  LDLDIRECT in the last 72 hours. Anemia Panel: No results for input(s): VITAMINB12, FOLATE, FERRITIN, TIBC, IRON, RETICCTPCT in the last 72 hours. Urine analysis:    Component Value Date/Time   COLORURINE YELLOW (A) 07/16/2020 1728   APPEARANCEUR CLOUDY (A) 07/16/2020 1728   LABSPEC 1.023 07/16/2020 1728   PHURINE 8.0 07/16/2020 1728   GLUCOSEU NEGATIVE 07/16/2020 1728   HGBUR NEGATIVE 07/16/2020 1728   BILIRUBINUR NEGATIVE 07/16/2020 1728   KETONESUR 5 (A) 07/16/2020 1728   PROTEINUR NEGATIVE 07/16/2020 1728   UROBILINOGEN 1.0 04/25/2013 1427   NITRITE NEGATIVE 07/16/2020 1728   LEUKOCYTESUR NEGATIVE 07/16/2020 1728   Sepsis Labs: Invalid input(s): PROCALCITONIN, LACTICIDVEN  Microbiology: Recent Results (from the past 240 hour(s))  SARS CORONAVIRUS 2 (TAT 6-24 HRS) Nasopharyngeal Nasopharyngeal Swab     Status: None   Collection Time: 09/06/20  3:00 PM   Specimen: Nasopharyngeal Swab  Result Value Ref Range Status   SARS Coronavirus 2 NEGATIVE NEGATIVE Final    Comment: (NOTE) SARS-CoV-2 target nucleic acids are NOT DETECTED.  The SARS-CoV-2 RNA is generally detectable in upper and lower respiratory specimens during the acute phase of infection. Negative results do not preclude SARS-CoV-2 infection, do not rule out co-infections with other pathogens, and should not be used as the sole basis for treatment or other patient management decisions. Negative results must be combined with clinical observations, patient history, and epidemiological information. The expected result is Negative.  Fact Sheet for Patients: HairSlick.no  Fact Sheet for Healthcare Providers: quierodirigir.com  This test is not yet approved or cleared by the [place] FDA and  has been authorized for detection and/or diagnosis of SARS-CoV-2 by FDA under an Emergency Use Authorization (EUA). This EUA will remain  in effect (meaning this test  can be used) for the duration of the COVID-19 declaration under Se ction 564(b)(1) of the Act, 21 U.S.C. section 360bbb-3(b)(1), unless the authorization is terminated or revoked sooner.  Performed at [facility], [address] [place], [place] [address]   Resp Panel by RT-PCR (Flu A&B, Covid) Nasopharyngeal Swab     Status: None   Collection Time: 09/10/20  9:00 AM   Specimen: Nasopharyngeal Swab; Nasopharyngeal(NP) swabs in vial transport medium  Result Value Ref Range Status   SARS Coronavirus 2 by RT PCR NEGATIVE NEGATIVE Final    Comment: (NOTE) SARS-CoV-2 target nucleic acids are NOT DETECTED.  The SARS-CoV-2 RNA is generally detectable in upper respiratory specimens during the acute phase of infection. The lowest concentration of SARS-CoV-2 viral copies this assay can detect is 138 copies/mL. A negative result does not preclude SARS-Cov-2 infection and should not be used as the sole basis for treatment or other patient management decisions. A negative result may occur with  improper specimen collection/handling, submission of specimen other than nasopharyngeal swab, presence of viral mutation(s) within the areas targeted by this assay, and inadequate number of viral copies(<138 copies/mL). A negative result must be combined with clinical observations, patient history, and epidemiological information. The expected result is Negative.  Fact Sheet for Patients:  BloggerCourse.com  Fact Sheet for Healthcare Providers:  SeriousBroker.it  This test is no t yet approved or cleared by the [place] FDA and  has been authorized for detection and/or diagnosis of SARS-CoV-2 by FDA under an Emergency Use Authorization (EUA). This EUA will remain  in effect (meaning this test can be used) for the duration of the COVID-19 declaration under Section 564(b)(1) of the Act, 21 U.S.C.section 360bbb-3(b)(1), unless the  authorization is terminated  or revoked sooner.       Influenza A by PCR NEGATIVE NEGATIVE Final   Influenza B by PCR NEGATIVE NEGATIVE Final    Comment: (NOTE) The Xpert Xpress SARS-CoV-2/FLU/RSV plus assay is intended as an aid in the diagnosis of influenza from Nasopharyngeal swab specimens and should not be used as a sole basis for treatment. Nasal washings and aspirates are unacceptable for Xpert Xpress SARS-CoV-2/FLU/RSV testing.  Fact Sheet for Patients: BloggerCourse.com  Fact Sheet for Healthcare Providers: SeriousBroker.it  This test is not yet approved or cleared by the [place] FDA and has been authorized for detection and/or diagnosis of SARS-CoV-2 by FDA under an Emergency Use Authorization (EUA). This EUA will remain in effect (meaning this test can be used) for the duration of the COVID-19 declaration under Section 564(b)(1) of the Act, 21 U.S.C. section 360bbb-3(b)(1), unless the  authorization is terminated or revoked.  Performed at [facility], [address] [place], [place] [id number]     Radiology Studies: No results found.    Abagale Boulos T. Mileidy Atkin [facility]  If 7PM-7AM, please contact night-coverage [email or web address] 09/15/2020, 3:24 PM

## 2020-09-16 MED ORDER — SODIUM CHLORIDE 0.9 % IV SOLN
12.5000 mg | Freq: Three times a day (TID) | INTRAVENOUS | Status: DC | PRN
  Administered 2020-09-23 – 2020-10-22 (×2): 12.5 mg via INTRAVENOUS
  Filled 2020-09-16 (×3): qty 0.5

## 2020-09-16 NOTE — Progress Notes (Signed)
PROGRESS NOTE  Alicia Duncan:[id number] DOB: Feb 27, 1976   PCP: [name], MD  Patient is from: Home with home care.   Totally dependent for ADLs at baseline.  DOA: 09/03/2020 LOS: 11  Chief complaints:  Chief Complaint  Patient presents with   social work    Brief Narrative / Interim history: 45 year old F with PMH of Rett syndrome, cognitive impairment, developmental delay, nonverbal, seizure disorder and debility with complete dependence for ADLs brought to ED by EMS due to unsafe home environment.  Patient's caregiver went on vacation for several days and family at home have not been able to adequately take care of the patient.  Reportedly, patient's father who is advanced dementia.  Patient's brother with schizophrenia.  Patient is medically stable but has no safe disposition yet.   Subjective: Seen and examined earlier this morning.  No major events overnight of this morning.  Patient is nonverbal and has major cognitive impairment.  She does not follows command or converse.  Does not appear to be in distress.  Objective: Vitals:   09/15/20 2335 09/16/20 0453 09/16/20 0742 09/16/20 1130  BP: 112/82 97/67 96/61  116/67  Pulse: 95 79 86 86  Resp:  16 16 16   Temp:  (!) 97.3 F (36.3 C) (!) 97 F (36.1 C) 98.2 F (36.8 C)  TempSrc:  Oral Axillary Axillary  SpO2: 99% 100% 98% 96%  Weight:      Height:        Intake/Output Summary (Last 24 hours) at 09/16/2020 1551 Last data filed at 09/16/2020 1347 Gross per 24 hour  Intake 5901.5 ml  Output --  Net 5901.5 ml   Filed Weights   09/03/20 1613 09/03/20 2240  Weight: 40.8 kg 30.3 kg    Examination:  GENERAL: Appears frail.  No apparent distress. HEENT: MMM.  Vision and hearing grossly intact.  NECK: Supple.  No apparent JVD.  RESP: On RA.  No IWOB.  Fair aeration bilaterally. CVS:  RRR. Heart sounds normal.  ABD/GI/GU: BS+. Abd soft, NTND.  MSK/EXT:  Moves extremities.  Significant muscle mass and subcu  fat loss. SKIN: no apparent skin lesion or wound NEURO: Awake and alert.  No apparent focal neuro deficit. PSYCH: Calm.  No distress or agitation.  Procedures:  None  Microbiology summarized: COVID-19 and influenza PCR nonreactive.  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 -Continue supportive care, dysphagia 1 diet and aspiration precautions  Unwitnessed fall in the hospital-patient was found on her knees next to the bed on 09/14/2020.  No injuries.  No changes mental status.  Patient's brother notified.  Per her brother, patient has a tendency to wiggle her way out of bed. -Fall precautions.   History of seizure disorder: Stable. -Continue with Ativan, Trileptal, Gabitril.   Unintentional overdose with baclofen-reportedly high at 100 mg of baclofen on 5/30 and 5/31.  No adverse outcomes.  No mental status change. -Continue home baclofen.   Anxiety/depression: Stable but difficult to assess. -Continue with fluoxetine and ativan.   Candida Vulvovaginitis -Treated with nystatin.   Tachycardia: Resolved.   Severe Protein Malnutrition;  Body mass index is 13.49 kg/m. Nutrition Problem: Severe Malnutrition Etiology: chronic illness (Rett syndrome) Signs/Symptoms: severe muscle depletion, severe fat depletion, percent weight loss (11.6% x 6 months) Percent weight loss: 11.6 % (x 6 months) Interventions: Ensure Enlive (each supplement provides 350kcal and 20 grams of protein)   DVT prophylaxis:  enoxaparin (LOVENOX)  injection 30 mg Start: 09/03/20 2200  Code Status: DNR/DNI Family Communication: Patient and/or RN.  Updated patient's father at bedside on 6/11.  None at bedside today.   Level of care: Med-Surg Status is: Inpatient  Remains inpatient appropriate because:Unsafe d/c plan  Dispo: The patient is from: Home              Anticipated d/c is  to:  To be determined              Patient currently is medically stable to d/c.   Difficult to place patient Yes       Consultants:  None   Sch Meds:  Scheduled Meds:  baclofen  10 mg Oral TID   docusate  100 mg Oral BID   enoxaparin (LOVENOX) injection  30 mg Subcutaneous Q24H   feeding supplement  237 mL Oral TID BM   FLUoxetine  10 mg Oral Daily   LORazepam  1 mg Oral BID   nystatin cream   Topical BID   OXcarbazepine  600 mg Oral BID   tiaGABine  4 mg Oral BID   Continuous Infusions:  sodium chloride 75 mL/hr at 09/15/20 1059   PRN Meds:.acetaminophen **OR** acetaminophen, ondansetron **OR** ondansetron (ZOFRAN) IV  Antimicrobials: Anti-infectives (From admission, onward)    None        I have personally reviewed the following labs and images: CBC: Recent Labs  Lab 09/10/20 1451  WBC 11.0*  HGB 12.7  HCT 39.5  MCV 92.3  PLT 167   BMP &GFR Recent Labs  Lab 09/10/20 1451 09/11/20 0453  NA 137 138  K 3.9 3.9  CL 106 107  CO2 25 25  GLUCOSE 143* 119*  BUN 16 17  CREATININE 0.42* 0.38*  CALCIUM 8.4* 8.2*   Estimated Creatinine Clearance: 42 mL/min (A) (by C-G formula based on SCr of 0.38 mg/dL (L)). Liver & Pancreas: No results for input(s): AST, ALT, ALKPHOS, BILITOT, PROT, ALBUMIN in the last 168 hours. No results for input(s): LIPASE, AMYLASE in the last 168 hours. No results for input(s): AMMONIA in the last 168 hours. Diabetic: No results for input(s): HGBA1C in the last 72 hours. No results for input(s): GLUCAP in the last 168 hours. Cardiac Enzymes: No results for input(s): CKTOTAL, CKMB, CKMBINDEX, TROPONINI in the last 168 hours. No results for input(s): PROBNP in the last 8760 hours. Coagulation Profile: No results for input(s): INR, PROTIME in the last 168 hours. Thyroid Function Tests: No results for input(s): TSH, T4TOTAL, FREET4, T3FREE, THYROIDAB in the last 72 hours. Lipid Profile: No results for input(s): CHOL, HDL,  LDLCALC, TRIG, CHOLHDL, LDLDIRECT in the last 72 hours. Anemia Panel: No results for input(s): VITAMINB12, FOLATE, FERRITIN, TIBC, IRON, RETICCTPCT in the last 72 hours. Urine analysis:    Component Value Date/Time   COLORURINE YELLOW (A) 07/16/2020 1728   APPEARANCEUR CLOUDY (A) 07/16/2020 1728   LABSPEC 1.023 07/16/2020 1728   PHURINE 8.0 07/16/2020 1728   GLUCOSEU NEGATIVE 07/16/2020 1728   HGBUR NEGATIVE 07/16/2020 1728   BILIRUBINUR NEGATIVE 07/16/2020 1728   KETONESUR 5 (A) 07/16/2020 1728   PROTEINUR NEGATIVE 07/16/2020 1728   UROBILINOGEN 1.0 04/25/2013 1427   NITRITE NEGATIVE 07/16/2020 1728   LEUKOCYTESUR NEGATIVE 07/16/2020 1728   Sepsis Labs: Invalid input(s): PROCALCITONIN, LACTICIDVEN  Microbiology: Recent Results (from the past 240 hour(s))  Resp Panel by RT-PCR (Flu A&B, Covid) Nasopharyngeal Swab     Status: None   Collection Time: 09/10/20  9:00 AM  Specimen: Nasopharyngeal Swab; Nasopharyngeal(NP) swabs in vial transport medium  Result Value Ref Range Status   SARS Coronavirus 2 by RT PCR NEGATIVE NEGATIVE Final    Comment: (NOTE) SARS-CoV-2 target nucleic acids are NOT DETECTED.  The SARS-CoV-2 RNA is generally detectable in upper respiratory specimens during the acute phase of infection. The lowest concentration of SARS-CoV-2 viral copies this assay can detect is 138 copies/mL. A negative result does not preclude SARS-Cov-2 infection and should not be used as the sole basis for treatment or other patient management decisions. A negative result may occur with  improper specimen collection/handling, submission of specimen other than nasopharyngeal swab, presence of viral mutation(s) within the areas targeted by this assay, and inadequate number of viral copies(<138 copies/mL). A negative result must be combined with clinical observations, patient history, and epidemiological information. The expected result is Negative.  Fact Sheet for Patients:   BloggerCourse.com  Fact Sheet for Healthcare Providers:  SeriousBroker.it  This test is no t yet approved or cleared by the [place] FDA and  has been authorized for detection and/or diagnosis of SARS-CoV-2 by FDA under an Emergency Use Authorization (EUA). This EUA will remain  in effect (meaning this test can be used) for the duration of the COVID-19 declaration under Section 564(b)(1) of the Act, 21 U.S.C.section 360bbb-3(b)(1), unless the authorization is terminated  or revoked sooner.       Influenza A by PCR NEGATIVE NEGATIVE Final   Influenza B by PCR NEGATIVE NEGATIVE Final    Comment: (NOTE) The Xpert Xpress SARS-CoV-2/FLU/RSV plus assay is intended as an aid in the diagnosis of influenza from Nasopharyngeal swab specimens and should not be used as a sole basis for treatment. Nasal washings and aspirates are unacceptable for Xpert Xpress SARS-CoV-2/FLU/RSV testing.  Fact Sheet for Patients: BloggerCourse.com  Fact Sheet for Healthcare Providers: SeriousBroker.it  This test is not yet approved or cleared by the [place] FDA and has been authorized for detection and/or diagnosis of SARS-CoV-2 by FDA under an Emergency Use Authorization (EUA). This EUA will remain in effect (meaning this test can be used) for the duration of the COVID-19 declaration under Section 564(b)(1) of the Act, 21 U.S.C. section 360bbb-3(b)(1), unless the authorization is terminated or revoked.  Performed at [facility], [address] [place], [place] [address]     Radiology Studies: No results found.    Jonalyn Sedlak T. Burdett Pinzon [facility]  If 7PM-7AM, please contact night-coverage [email or web address] 09/16/2020, 3:51 PM

## 2020-09-17 NOTE — TOC Progression Note (Signed)
Transition of Care [facility]) - Progression Note    Patient Details  Name: Alicia Duncan MRN: [id number] Date of Birth: 1976/02/13  Transition of Care [facility]) CM/SW Contact  Allayne Butcher, RN Phone Number: 09/17/2020, 2:02 PM  Clinical Narrative:    RNCM spoke with [name] about placement.  Angie with [facility] will come to the hospital to do an evaluation on the patient to see if she would be a good fit for her facility.  Hopefully she will be able to come out early this week.    Expected Discharge Plan: Group Home Barriers to Discharge: Other (must enter comment) (patient needs a specialized care home placement)  Expected Discharge Plan and Services Expected Discharge Plan: Group Home   Discharge Planning Services: CM Consult Post Acute Care Choice: Nursing Home Living arrangements for the past 2 months: Single Family Home Expected Discharge Date: 09/13/20               DME Arranged: N/A DME Agency: NA         HH Agency: NA         Social Determinants of Health (SDOH) Interventions    Readmission Risk Interventions No flowsheet data found.

## 2020-09-17 NOTE — Progress Notes (Signed)
PROGRESS NOTE  Alicia Duncan JSE:[id number] DOB: 08-09-1975   PCP: [name], MD  Patient is from: Home with home care.   Totally dependent for ADLs at baseline.  DOA: 09/03/2020 LOS: 12  Chief complaints:  Chief Complaint  Patient presents with   social work    Brief Narrative / Interim history: 45 year old F with PMH of Rett syndrome, cognitive impairment, developmental delay, nonverbal, seizure disorder and debility with complete dependence for ADLs brought to ED by EMS due to unsafe home environment.  Patient's caregiver went on vacation for several days and family at home have not been able to adequately take care of the patient.  Reportedly, patient's father who is advanced dementia.  Patient's brother with schizophrenia.  Patient is medically stable but has no safe disposition yet.   Subjective: Seen and examined earlier this morning.  No major events overnight of this morning.  Patient is nonverbal.  Does not follows command.  Does not appear to be in distress.  Objective: Vitals:   09/17/20 0106 09/17/20 0335 09/17/20 0732 09/17/20 1130  BP: 95/66 91/60 (!) 90/57 (!) 93/58  Pulse: 87 84 83 98  Resp: 18 16 16 16   Temp: 97.7 F (36.5 C) 98 F (36.7 C) (!) 97.4 F (36.3 C) 98.1 F (36.7 C)  TempSrc: Oral  Oral   SpO2: 99% 99% 98% 100%  Weight:      Height:        Intake/Output Summary (Last 24 hours) at 09/17/2020 1412 Last data filed at 09/17/2020 0050 Gross per 24 hour  Intake 1 ml  Output --  Net 1 ml   Filed Weights   09/03/20 1613 09/03/20 2240  Weight: 40.8 kg 30.3 kg    Examination:  GENERAL: Frail looking.  No apparent distress. HEENT: MMM.  Vision and hearing grossly intact.  NECK: Supple.  No apparent JVD.  RESP:  No IWOB.  Fair aeration bilaterally. CVS:  RRR. Heart sounds normal.  ABD/GI/GU: BS+. Abd soft, NTND.  MSK/EXT:  Moves extremities.  Significant muscle mass and subcu fat loss. SKIN: no apparent skin lesion or wound NEURO:  Awake and alert.  Not able to assess orientation.  Does not follows command. PSYCH: Calm.  No distress or agitation.  Procedures:  None  Microbiology summarized: COVID-19 and influenza PCR nonreactive.  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 -Continue supportive care, dysphagia 1 diet and aspiration precautions  Unwitnessed fall in the hospital-patient was found on her knees next to the bed on 09/14/2020.  No injuries.  No changes mental status.  Patient's brother notified.  Per her brother, patient has a tendency to wiggle her way out of bed. -Fall precautions.   History of seizure disorder: Stable. -Continue with Ativan, Trileptal, Gabitril.   Unintentional overdose with baclofen-reportedly high at 100 mg of baclofen on 5/30 and 5/31.  No adverse outcomes.  No mental status change. -Continue home baclofen.  Emesis: Had 2 episodes of emesis on 6/12.  Seems to have resolved.  Abdominal exam benign.  And normal BMs. -As needed antiemetics   Anxiety/depression: Stable but difficult to assess. -Continue with fluoxetine and ativan.   Candida Vulvovaginitis -Treated with nystatin.   Tachycardia: Resolved.   Severe Protein Malnutrition;  Body mass index is 13.49 kg/m. Nutrition Problem: Severe Malnutrition Etiology: chronic illness (Rett syndrome) Signs/Symptoms: severe muscle depletion, severe fat depletion, percent weight loss (11.6% x 6 months) Percent weight loss:  11.6 % (x 6 months) Interventions: Ensure Enlive (each supplement provides 350kcal and 20 grams of protein)   DVT prophylaxis:  enoxaparin (LOVENOX) injection 30 mg Start: 09/03/20 2200  Code Status: DNR/DNI Family Communication: Patient and/or RN.  No family member at bedside.  Level of care: Med-Surg Status is: Inpatient  Remains inpatient appropriate because:Unsafe d/c  plan  Dispo: The patient is from: Home              Anticipated d/c is to:  To be determined              Patient currently is medically stable to d/c.   Difficult to place patient Yes       Consultants:  None   Sch Meds:  Scheduled Meds:  baclofen  10 mg Oral TID   docusate  100 mg Oral BID   enoxaparin (LOVENOX) injection  30 mg Subcutaneous Q24H   feeding supplement  237 mL Oral TID BM   FLUoxetine  10 mg Oral Daily   LORazepam  1 mg Oral BID   nystatin cream   Topical BID   OXcarbazepine  600 mg Oral BID   tiaGABine  4 mg Oral BID   Continuous Infusions:  sodium chloride 75 mL/hr at 09/17/20 0422   promethazine (PHENERGAN) injection (IM or IVPB)     PRN Meds:.acetaminophen **OR** acetaminophen, ondansetron **OR** ondansetron (ZOFRAN) IV, promethazine (PHENERGAN) injection (IM or IVPB)  Antimicrobials: Anti-infectives (From admission, onward)    None        I have personally reviewed the following labs and images: CBC: Recent Labs  Lab 09/10/20 1451  WBC 11.0*  HGB 12.7  HCT 39.5  MCV 92.3  PLT 167   BMP &GFR Recent Labs  Lab 09/10/20 1451 09/11/20 0453  NA 137 138  K 3.9 3.9  CL 106 107  CO2 25 25  GLUCOSE 143* 119*  BUN 16 17  CREATININE 0.42* 0.38*  CALCIUM 8.4* 8.2*   Estimated Creatinine Clearance: 42 mL/min (A) (by C-G formula based on SCr of 0.38 mg/dL (L)). Liver & Pancreas: No results for input(s): AST, ALT, ALKPHOS, BILITOT, PROT, ALBUMIN in the last 168 hours. No results for input(s): LIPASE, AMYLASE in the last 168 hours. No results for input(s): AMMONIA in the last 168 hours. Diabetic: No results for input(s): HGBA1C in the last 72 hours. No results for input(s): GLUCAP in the last 168 hours. Cardiac Enzymes: No results for input(s): CKTOTAL, CKMB, CKMBINDEX, TROPONINI in the last 168 hours. No results for input(s): PROBNP in the last 8760 hours. Coagulation Profile: No results for input(s): INR, PROTIME in the last 168  hours. Thyroid Function Tests: No results for input(s): TSH, T4TOTAL, FREET4, T3FREE, THYROIDAB in the last 72 hours. Lipid Profile: No results for input(s): CHOL, HDL, LDLCALC, TRIG, CHOLHDL, LDLDIRECT in the last 72 hours. Anemia Panel: No results for input(s): VITAMINB12, FOLATE, FERRITIN, TIBC, IRON, RETICCTPCT in the last 72 hours. Urine analysis:    Component Value Date/Time   COLORURINE YELLOW (A) 07/16/2020 1728   APPEARANCEUR CLOUDY (A) 07/16/2020 1728   LABSPEC 1.023 07/16/2020 1728   PHURINE 8.0 07/16/2020 1728   GLUCOSEU NEGATIVE 07/16/2020 1728   HGBUR NEGATIVE 07/16/2020 1728   BILIRUBINUR NEGATIVE 07/16/2020 1728   KETONESUR 5 (A) 07/16/2020 1728   PROTEINUR NEGATIVE 07/16/2020 1728   UROBILINOGEN 1.0 04/25/2013 1427   NITRITE NEGATIVE 07/16/2020 1728   LEUKOCYTESUR NEGATIVE 07/16/2020 1728   Sepsis Labs: Invalid input(s): PROCALCITONIN, LACTICIDVEN  Microbiology: Recent  Results (from the past 240 hour(s))  Resp Panel by RT-PCR (Flu A&B, Covid) Nasopharyngeal Swab     Status: None   Collection Time: 09/10/20  9:00 AM   Specimen: Nasopharyngeal Swab; Nasopharyngeal(NP) swabs in vial transport medium  Result Value Ref Range Status   SARS Coronavirus 2 by RT PCR NEGATIVE NEGATIVE Final    Comment: (NOTE) SARS-CoV-2 target nucleic acids are NOT DETECTED.  The SARS-CoV-2 RNA is generally detectable in upper respiratory specimens during the acute phase of infection. The lowest concentration of SARS-CoV-2 viral copies this assay can detect is 138 copies/mL. A negative result does not preclude SARS-Cov-2 infection and should not be used as the sole basis for treatment or other patient management decisions. A negative result may occur with  improper specimen collection/handling, submission of specimen other than nasopharyngeal swab, presence of viral mutation(s) within the areas targeted by this assay, and inadequate number of viral copies(<138 copies/mL). A  negative result must be combined with clinical observations, patient history, and epidemiological information. The expected result is Negative.  Fact Sheet for Patients:  BloggerCourse.com  Fact Sheet for Healthcare Providers:  SeriousBroker.it  This test is no t yet approved or cleared by the [place] FDA and  has been authorized for detection and/or diagnosis of SARS-CoV-2 by FDA under an Emergency Use Authorization (EUA). This EUA will remain  in effect (meaning this test can be used) for the duration of the COVID-19 declaration under Section 564(b)(1) of the Act, 21 U.S.C.section 360bbb-3(b)(1), unless the authorization is terminated  or revoked sooner.       Influenza A by PCR NEGATIVE NEGATIVE Final   Influenza B by PCR NEGATIVE NEGATIVE Final    Comment: (NOTE) The Xpert Xpress SARS-CoV-2/FLU/RSV plus assay is intended as an aid in the diagnosis of influenza from Nasopharyngeal swab specimens and should not be used as a sole basis for treatment. Nasal washings and aspirates are unacceptable for Xpert Xpress SARS-CoV-2/FLU/RSV testing.  Fact Sheet for Patients: BloggerCourse.com  Fact Sheet for Healthcare Providers: SeriousBroker.it  This test is not yet approved or cleared by the [place] FDA and has been authorized for detection and/or diagnosis of SARS-CoV-2 by FDA under an Emergency Use Authorization (EUA). This EUA will remain in effect (meaning this test can be used) for the duration of the COVID-19 declaration under Section 564(b)(1) of the Act, 21 U.S.C. section 360bbb-3(b)(1), unless the authorization is terminated or revoked.  Performed at [facility], [address] [place], [place] [address]     Radiology Studies: No results found.    Shaliyah Taite T. Lynee Rosenbach [facility]  If 7PM-7AM, please contact  night-coverage [email or web address] 09/17/2020, 2:12 PM

## 2020-09-17 NOTE — Plan of Care (Signed)

## 2020-09-18 NOTE — Progress Notes (Signed)
Nutrition Follow-up  DOCUMENTATION CODES:  Severe malnutrition in context of chronic illness, Underweight  INTERVENTION:  Continue diet as ordered by SLP Nursing staff to assist pt with meals (set-up /feed) Ensure Enlive po TID, each supplement provides 350 kcal and 20 grams of protein Magic cup TID with meals, each supplement provides 290 kcal and 9 grams of protein Request new measured weight  NUTRITION DIAGNOSIS:  Severe Malnutrition related to chronic illness (Rett syndrome) as evidenced by severe muscle depletion, severe fat depletion, percent weight loss (11.6% x 6 months).  GOAL:  Patient will meet greater than or equal to 90% of their needs, Weight gain  MONITOR:  PO intake, Supplement acceptance, Labs, Weight trends  REASON FOR ASSESSMENT:  Other (Comment), Malnutrition Screening Tool (Low BMI, 13.48)    ASSESSMENT:  Pt brought to ED after concerns were raised over her care at home. Caregiver has been on vacation for several days and family at home have not been able to adequately take care of the patient. 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   Pt remains inpatient at this time awaiting placement. MD reports medically stable for discharge once facility placement is secured.  Reviewed intake of meals, intake remains extremely poor. Pt is routinely accepting the ensure enlive per RN documentation. Will request new measured weight again, none  taken since admission.  Average Meal Intake: 5/30-5/31: 50% intake x 1 recorded meal 6/1-6/7: 38% intake x 2 recorded meals 6/8-6/14: 19% intake x 8 recorded meals  Nutritionally Relevant Medications Scheduled Meds:  baclofen  10 mg Oral TID   docusate  100 mg Oral BID   Continuous Infusions:  sodium chloride 75 mL/hr at 09/17/20 1743   PRN Meds: ondansetron, promethazine  Labs  reviewed  NUTRITION - FOCUSED PHYSICAL EXAM: Flowsheet Row Most Recent Value  Orbital Region Mild depletion  Upper Arm Region Severe depletion  Thoracic and Lumbar Region Severe depletion  Buccal Region Mild depletion  Temple Region Mild depletion  Clavicle Bone Region Mild depletion  Clavicle and Acromion Bone Region Severe depletion  Scapular Bone Region Severe depletion  Dorsal Hand Mild depletion  Patellar Region Severe depletion  Anterior Thigh Region Severe depletion  Posterior Calf Region Severe depletion  Edema (RD Assessment) None  Hair Reviewed  Eyes Unable to assess  Mouth Unable to assess  Skin Reviewed  Nails Reviewed   Diet Order:   Diet Order             DIET - DYS 1 Room service appropriate? No; Fluid consistency: Thin  Diet effective now           Diet - low sodium heart healthy                  EDUCATION NEEDS:  Not appropriate for education at this time  Skin:  Skin Assessment: Reviewed RN Assessment  Last BM:  6/13 - type 6  Height:  Ht Readings from Last 1 Encounters:  09/03/20 4\' 11"  (1.499 m)   Weight:  Wt Readings from Last 1 Encounters:  09/03/20 30.3 kg   Ideal Body Weight:  44.3 kg  BMI:  Body mass index is 13.49 kg/m.  Estimated Nutritional Needs:  Kcal:  1400-1600 kcal/d Protein:  70-85 g/d Fluid:  >1500 mL/d  09/05/20, RD, LDN Clinical Dietitian Pager on Amion

## 2020-09-18 NOTE — Progress Notes (Signed)
PROGRESS NOTE  Alicia Duncan ZYS:[id number] DOB: 1975-07-31   PCP: [name], MD  Patient is from: Home with home care.   Totally dependent for ADLs at baseline.  DOA: 09/03/2020 LOS: 13  Chief complaints:  Chief Complaint  Patient presents with   social work    Brief Narrative / Interim history: 45 year old F with PMH of Rett syndrome, cognitive impairment, developmental delay, nonverbal, seizure disorder and debility with complete dependence for ADLs brought to ED by EMS due to unsafe home environment.  Patient's caregiver went on vacation for several days and family at home have not been able to adequately take care of the patient.  Reportedly, patient's father who is advanced dementia.  Patient's brother with schizophrenia.  Patient is medically stable but has no safe disposition yet.  Patient's insurance and TOC working on special kind of home care.  Subjective: Seen and examined earlier this morning.  No major events overnight of this morning.  Patient is nonverbal.  Does not follows commands.  Does not appear to be in distress.  Objective: Vitals:   09/18/20 0515 09/18/20 0853 09/18/20 1110 09/18/20 1111  BP: (!) 97/55 94/66 (!) 90/49   Pulse: 87 96 (!) 109 94  Resp: 16 18 16    Temp: (!) 97.5 F (36.4 C) 97.8 F (36.6 C) 98.1 F (36.7 C)   TempSrc: Oral Oral    SpO2: 97%  99%   Weight:      Height:        Intake/Output Summary (Last 24 hours) at 09/18/2020 1500 Last data filed at 09/18/2020 1023 Gross per 24 hour  Intake 240 ml  Output --  Net 240 ml   Filed Weights   09/03/20 1613 09/03/20 2240  Weight: 40.8 kg 30.3 kg    Examination:  GENERAL: Frail looking.  No apparent distress. HEENT: MMM.  Vision and hearing grossly intact.  NECK: Supple.  No apparent JVD.  RESP: On RA.  No IWOB.  Fair aeration bilaterally. CVS:  RRR. Heart sounds normal.  ABD/GI/GU: BS+. Abd soft, NTND.  MSK/EXT:  Moves extremities.  Significant muscle mass and subcu fat  loss. SKIN: no apparent skin lesion or wound NEURO: Awake and alert.  Not oriented.  Does not follows command.  Nonverbal.  No apparent focal neuro deficit. PSYCH: Calm. Normal affect.   Procedures:  None  Microbiology summarized: COVID-19 and influenza PCR nonreactive.  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 -Continue supportive care, dysphagia 1 diet and aspiration precautions  Unwitnessed fall in the hospital-patient was found on her knees next to the bed on 09/14/2020.  No injuries.  No changes mental status.  Patient's brother notified.  Per her brother, patient has a tendency to wiggle her way out of bed. -Fall precautions.   History of seizure disorder: Stable. -Continue with Ativan, Trileptal, Gabitril.   Unintentional overdose with baclofen-reportedly high at 100 mg of baclofen on 5/30 and 5/31.  No adverse outcomes.  No mental status change. -Continue home baclofen.  Emesis: 2 episodes 6/12. Abdominal exam benign.  And normal BMs.  Resolved. -Antiemetics as needed.   Anxiety/depression: Stable but difficult to assess. -Continue with fluoxetine and ativan.   Candida Vulvovaginitis -Treated with nystatin.   Tachycardia: Resolved.   Severe Protein Malnutrition;  Body mass index is 13.49 kg/m. Nutrition Problem: Severe Malnutrition Etiology: chronic illness (Rett syndrome) Signs/Symptoms: severe muscle depletion, severe fat depletion, percent weight loss (11.6%  x 6 months) Percent weight loss: 11.6 % (x 6 months) Interventions: Ensure Enlive (each supplement provides 350kcal and 20 grams of protein)   DVT prophylaxis:  enoxaparin (LOVENOX) injection 30 mg Start: 09/03/20 2200  Code Status: DNR/DNI Family Communication: Patient and/or RN.  No family member at bedside.  Level of care: Med-Surg Status is: Inpatient  Remains inpatient  appropriate because:Unsafe d/c plan  Dispo: The patient is from: Home              Anticipated d/c is to:  To be determined              Patient currently is medically stable to d/c.   Difficult to place patient Yes       Consultants:  None   Sch Meds:  Scheduled Meds:  baclofen  10 mg Oral TID   docusate  100 mg Oral BID   enoxaparin (LOVENOX) injection  30 mg Subcutaneous Q24H   feeding supplement  237 mL Oral TID BM   FLUoxetine  10 mg Oral Daily   LORazepam  1 mg Oral BID   nystatin cream   Topical BID   OXcarbazepine  600 mg Oral BID   tiaGABine  4 mg Oral BID   Continuous Infusions:  sodium chloride 75 mL/hr at 09/17/20 1743   promethazine (PHENERGAN) injection (IM or IVPB)     PRN Meds:.acetaminophen **OR** acetaminophen, ondansetron **OR** ondansetron (ZOFRAN) IV, promethazine (PHENERGAN) injection (IM or IVPB)  Antimicrobials: Anti-infectives (From admission, onward)    None        I have personally reviewed the following labs and images: CBC: No results for input(s): WBC, NEUTROABS, HGB, HCT, MCV, PLT in the last 168 hours.  BMP &GFR No results for input(s): NA, K, CL, CO2, GLUCOSE, BUN, CREATININE, CALCIUM, MG, PHOS in the last 168 hours.  Invalid input(s):  GFRCG  Estimated Creatinine Clearance: 42 mL/min (A) (by C-G formula based on SCr of 0.38 mg/dL (L)). Liver & Pancreas: No results for input(s): AST, ALT, ALKPHOS, BILITOT, PROT, ALBUMIN in the last 168 hours. No results for input(s): LIPASE, AMYLASE in the last 168 hours. No results for input(s): AMMONIA in the last 168 hours. Diabetic: No results for input(s): HGBA1C in the last 72 hours. No results for input(s): GLUCAP in the last 168 hours. Cardiac Enzymes: No results for input(s): CKTOTAL, CKMB, CKMBINDEX, TROPONINI in the last 168 hours. No results for input(s): PROBNP in the last 8760 hours. Coagulation Profile: No results for input(s): INR, PROTIME in the last 168 hours. Thyroid  Function Tests: No results for input(s): TSH, T4TOTAL, FREET4, T3FREE, THYROIDAB in the last 72 hours. Lipid Profile: No results for input(s): CHOL, HDL, LDLCALC, TRIG, CHOLHDL, LDLDIRECT in the last 72 hours. Anemia Panel: No results for input(s): VITAMINB12, FOLATE, FERRITIN, TIBC, IRON, RETICCTPCT in the last 72 hours. Urine analysis:    Component Value Date/Time   COLORURINE YELLOW (A) 07/16/2020 1728   APPEARANCEUR CLOUDY (A) 07/16/2020 1728   LABSPEC 1.023 07/16/2020 1728   PHURINE 8.0 07/16/2020 1728   GLUCOSEU NEGATIVE 07/16/2020 1728   HGBUR NEGATIVE 07/16/2020 1728   BILIRUBINUR NEGATIVE 07/16/2020 1728   KETONESUR 5 (A) 07/16/2020 1728   PROTEINUR NEGATIVE 07/16/2020 1728   UROBILINOGEN 1.0 04/25/2013 1427   NITRITE NEGATIVE 07/16/2020 1728   LEUKOCYTESUR NEGATIVE 07/16/2020 1728   Sepsis Labs: Invalid input(s): PROCALCITONIN, LACTICIDVEN  Microbiology: Recent Results (from the past 240 hour(s))  Resp Panel by RT-PCR (Flu A&B, Covid) Nasopharyngeal Swab  Status: None   Collection Time: 09/10/20  9:00 AM   Specimen: Nasopharyngeal Swab; Nasopharyngeal(NP) swabs in vial transport medium  Result Value Ref Range Status   SARS Coronavirus 2 by RT PCR NEGATIVE NEGATIVE Final    Comment: (NOTE) SARS-CoV-2 target nucleic acids are NOT DETECTED.  The SARS-CoV-2 RNA is generally detectable in upper respiratory specimens during the acute phase of infection. The lowest concentration of SARS-CoV-2 viral copies this assay can detect is 138 copies/mL. A negative result does not preclude SARS-Cov-2 infection and should not be used as the sole basis for treatment or other patient management decisions. A negative result may occur with  improper specimen collection/handling, submission of specimen other than nasopharyngeal swab, presence of viral mutation(s) within the areas targeted by this assay, and inadequate number of viral copies(<138 copies/mL). A negative result must be  combined with clinical observations, patient history, and epidemiological information. The expected result is Negative.  Fact Sheet for Patients:  BloggerCourse.com  Fact Sheet for Healthcare Providers:  SeriousBroker.it  This test is no t yet approved or cleared by the [place] FDA and  has been authorized for detection and/or diagnosis of SARS-CoV-2 by FDA under an Emergency Use Authorization (EUA). This EUA will remain  in effect (meaning this test can be used) for the duration of the COVID-19 declaration under Section 564(b)(1) of the Act, 21 U.S.C.section 360bbb-3(b)(1), unless the authorization is terminated  or revoked sooner.       Influenza A by PCR NEGATIVE NEGATIVE Final   Influenza B by PCR NEGATIVE NEGATIVE Final    Comment: (NOTE) The Xpert Xpress SARS-CoV-2/FLU/RSV plus assay is intended as an aid in the diagnosis of influenza from Nasopharyngeal swab specimens and should not be used as a sole basis for treatment. Nasal washings and aspirates are unacceptable for Xpert Xpress SARS-CoV-2/FLU/RSV testing.  Fact Sheet for Patients: BloggerCourse.com  Fact Sheet for Healthcare Providers: SeriousBroker.it  This test is not yet approved or cleared by the [place] FDA and has been authorized for detection and/or diagnosis of SARS-CoV-2 by FDA under an Emergency Use Authorization (EUA). This EUA will remain in effect (meaning this test can be used) for the duration of the COVID-19 declaration under Section 564(b)(1) of the Act, 21 U.S.C. section 360bbb-3(b)(1), unless the authorization is terminated or revoked.  Performed at [facility], [address], [place], [place] [address]     Radiology Studies: No results found.    Zuri Lascala T. Chelsie Burel [facility]  If 7PM-7AM, please contact night-coverage [email or web address] 09/18/2020, 3:00 PM

## 2020-09-19 ENCOUNTER — Other Ambulatory Visit: Payer: Self-pay | Admitting: Neurology

## 2020-09-19 NOTE — Progress Notes (Signed)
PROGRESS NOTE  Alicia Duncan JFH:[id number] DOB: [birth year]   PCP: [name], MD  Patient is from: Home with home care.   Totally dependent for ADLs at baseline.  DOA: 09/03/2020 LOS: 14  Chief complaints:  Chief Complaint  Patient presents with   social work    Brief Narrative / Interim history: 45 year old F with PMH of Rett syndrome, cognitive impairment, developmental delay, nonverbal, seizure disorder and debility with complete dependence for ADLs brought to ED by EMS due to unsafe home environment.  Patient's caregiver went on vacation for several days and family at home have not been able to adequately take care of the patient.  Reportedly, patient's father who is advanced dementia.  Patient's brother with schizophrenia.  Patient is medically stable but has no safe disposition yet.  Patient's insurance and TOC working d/c.    Subjective: Nurse at bedside-- no overnight events  Objective: Vitals:   09/18/20 1936 09/18/20 2338 09/19/20 0330 09/19/20 0911  BP: 113/60 (!) 95/53 (!) 92/59 (!) 110/96  Pulse: 90 96 75 79  Resp: 16 16 14 16   Temp: 98.3 F (36.8 C) 97.9 F (36.6 C) 97.6 F (36.4 C) 98.1 F (36.7 C)  TempSrc:  Oral Oral   SpO2: 98% 100% 99% 91%  Weight:      Height:        Intake/Output Summary (Last 24 hours) at 09/19/2020 1042 Last data filed at 09/19/2020 1027 Gross per 24 hour  Intake 0 ml  Output --  Net 0 ml   Filed Weights   09/03/20 1613 09/03/20 2240  Weight: 40.8 kg 30.3 kg    Examination:   General: Appearance:    Thin female in no acute distress     Lungs:     respirations unlabored     MS:   All extremities are intact.    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 -Continue supportive care, dysphagia 1 diet and aspiration precautions -needs safe D/C  plan  Unwitnessed fall in the hospital-patient was found on her knees next to the bed on 09/14/2020.  No injuries.  No changes mental status.  Patient's brother notified.  Per her brother, patient has a tendency to wiggle her way out of bed. -Fall precautions.   History of seizure disorder: Stable. -Continue with Ativan, Trileptal, Gabitril.   Anxiety/depression: Stable but difficult to assess. -Continue with fluoxetine and ativan.   Candida Vulvovaginitis -Treated with nystatin.   Tachycardia: Resolved.   Severe Protein Malnutrition;  Body mass index is 13.49 kg/m. Nutrition Problem: Severe Malnutrition Etiology: chronic illness (Rett syndrome) Signs/Symptoms: severe muscle depletion, severe fat depletion, percent weight loss (11.6% x 6 months) Percent weight loss: 11.6 % (x 6 months) Interventions: Ensure Enlive (each supplement provides 350kcal and 20 grams of protein)   DVT prophylaxis:  enoxaparin (LOVENOX) injection 30 mg Start: 09/03/20 2200  Code Status: DNR/DNI  Level of care: Med-Surg Status is: Inpatient  Remains inpatient appropriate because:Unsafe d/c plan  Dispo: The patient is from: Home              Anticipated d/c is to:  To be determined              Patient currently is medically stable to d/c.   Difficult to place patient Yes       Consultants:  None  Sch Meds:  Scheduled Meds:  baclofen  10 mg Oral TID   docusate  100 mg Oral BID   enoxaparin (LOVENOX) injection  30 mg Subcutaneous Q24H   feeding supplement  237 mL Oral TID BM   FLUoxetine  10 mg Oral Daily   LORazepam  1 mg Oral BID   nystatin cream   Topical BID   OXcarbazepine  600 mg Oral BID   tiaGABine  4 mg Oral BID   Continuous Infusions:  sodium chloride 75 mL/hr at 09/17/20 1743   promethazine (PHENERGAN) injection (IM or IVPB)     PRN Meds:.acetaminophen **OR** acetaminophen, ondansetron **OR** ondansetron (ZOFRAN) IV, promethazine (PHENERGAN) injection (IM or  IVPB)  Antimicrobials: Anti-infectives (From admission, onward)    None        I have personally reviewed the following labs and images: CBC: No results for input(s): WBC, NEUTROABS, HGB, HCT, MCV, PLT in the last 168 hours.  BMP &GFR No results for input(s): NA, K, CL, CO2, GLUCOSE, BUN, CREATININE, CALCIUM, MG, PHOS in the last 168 hours.  Invalid input(s):  GFRCG  Estimated Creatinine Clearance: 42 mL/min (A) (by C-G formula based on SCr of 0.38 mg/dL (L)). Liver & Pancreas: No results for input(s): AST, ALT, ALKPHOS, BILITOT, PROT, ALBUMIN in the last 168 hours. No results for input(s): LIPASE, AMYLASE in the last 168 hours. No results for input(s): AMMONIA in the last 168 hours. Diabetic: No results for input(s): HGBA1C in the last 72 hours. No results for input(s): GLUCAP in the last 168 hours. Cardiac Enzymes: No results for input(s): CKTOTAL, CKMB, CKMBINDEX, TROPONINI in the last 168 hours. No results for input(s): PROBNP in the last 8760 hours. Coagulation Profile: No results for input(s): INR, PROTIME in the last 168 hours. Thyroid Function Tests: No results for input(s): TSH, T4TOTAL, FREET4, T3FREE, THYROIDAB in the last 72 hours. Lipid Profile: No results for input(s): CHOL, HDL, LDLCALC, TRIG, CHOLHDL, LDLDIRECT in the last 72 hours. Anemia Panel: No results for input(s): VITAMINB12, FOLATE, FERRITIN, TIBC, IRON, RETICCTPCT in the last 72 hours. Urine analysis:    Component Value Date/Time   COLORURINE YELLOW (A) 07/16/2020 1728   APPEARANCEUR CLOUDY (A) 07/16/2020 1728   LABSPEC 1.023 07/16/2020 1728   PHURINE 8.0 07/16/2020 1728   GLUCOSEU NEGATIVE 07/16/2020 1728   HGBUR NEGATIVE 07/16/2020 1728   BILIRUBINUR NEGATIVE 07/16/2020 1728   KETONESUR 5 (A) 07/16/2020 1728   PROTEINUR NEGATIVE 07/16/2020 1728   UROBILINOGEN 1.0 04/25/2013 1427   NITRITE NEGATIVE 07/16/2020 1728   LEUKOCYTESUR NEGATIVE 07/16/2020 1728   Sepsis Labs: Invalid input(s):  PROCALCITONIN, LACTICIDVEN  Microbiology: Recent Results (from the past 240 hour(s))  Resp Panel by RT-PCR (Flu A&B, Covid) Nasopharyngeal Swab     Status: None   Collection Time: 09/10/20  9:00 AM   Specimen: Nasopharyngeal Swab; Nasopharyngeal(NP) swabs in vial transport medium  Result Value Ref Range Status   SARS Coronavirus 2 by RT PCR NEGATIVE NEGATIVE Final    Comment: (NOTE) SARS-CoV-2 target nucleic acids are NOT DETECTED.  The SARS-CoV-2 RNA is generally detectable in upper respiratory specimens during the acute phase of infection. The lowest concentration of SARS-CoV-2 viral copies this assay can detect is 138 copies/mL. A negative result does not preclude SARS-Cov-2 infection and should not be used as the sole basis for treatment or other patient management decisions. A negative result may occur with  improper specimen collection/handling, submission of specimen other than nasopharyngeal swab, presence of viral mutation(s) within the areas targeted  by this assay, and inadequate number of viral copies(<138 copies/mL). A negative result must be combined with clinical observations, patient history, and epidemiological information. The expected result is Negative.  Fact Sheet for Patients:  BloggerCourse.com  Fact Sheet for Healthcare Providers:  SeriousBroker.it  This test is no t yet approved or cleared by the [place] FDA and  has been authorized for detection and/or diagnosis of SARS-CoV-2 by FDA under an Emergency Use Authorization (EUA). This EUA will remain  in effect (meaning this test can be used) for the duration of the COVID-19 declaration under Section 564(b)(1) of the Act, 21 U.S.C.section 360bbb-3(b)(1), unless the authorization is terminated  or revoked sooner.       Influenza A by PCR NEGATIVE NEGATIVE Final   Influenza B by PCR NEGATIVE NEGATIVE Final    Comment: (NOTE) The Xpert Xpress  SARS-CoV-2/FLU/RSV plus assay is intended as an aid in the diagnosis of influenza from Nasopharyngeal swab specimens and should not be used as a sole basis for treatment. Nasal washings and aspirates are unacceptable for Xpert Xpress SARS-CoV-2/FLU/RSV testing.  Fact Sheet for Patients: BloggerCourse.com  Fact Sheet for Healthcare Providers: SeriousBroker.it  This test is not yet approved or cleared by the [place] FDA and has been authorized for detection and/or diagnosis of SARS-CoV-2 by FDA under an Emergency Use Authorization (EUA). This EUA will remain in effect (meaning this test can be used) for the duration of the COVID-19 declaration under Section 564(b)(1) of the Act, 21 U.S.C. section 360bbb-3(b)(1), unless the authorization is terminated or revoked.  Performed at [facility], [address] [place], [place] [address]     Radiology Studies: No results found.    Marlin Canary DO [facility]  If 7PM-7AM, please contact night-coverage [email or web address] 09/19/2020, 10:42 AM

## 2020-09-20 ENCOUNTER — Encounter: Payer: Self-pay | Admitting: Internal Medicine

## 2020-09-20 ENCOUNTER — Other Ambulatory Visit: Payer: Self-pay

## 2020-09-20 DIAGNOSIS — R627 Adult failure to thrive: Secondary | ICD-10-CM | POA: Diagnosis not present

## 2020-09-20 NOTE — Progress Notes (Signed)
PROGRESS NOTE  Alicia Duncan VPX:[id number] DOB: 1975-05-22   PCP: [name], MD  Patient is from: Home with home care.   Totally dependent for ADLs at baseline.  DOA: 09/03/2020 LOS: 15  Chief complaints:  Chief Complaint  Patient presents with   social work    Brief Narrative / Interim history: 45 year old F with PMH of Rett syndrome, cognitive impairment, developmental delay, nonverbal, seizure disorder and debility with complete dependence for ADLs brought to ED by EMS due to unsafe home environment.  Patient's caregiver went on vacation for several days and family at home have not been able to adequately take care of the patient.  Reportedly, patient's father who is advanced dementia.  Patient's brother with schizophrenia.  Patient is medically stable but has no safe disposition yet.  Patient's insurance and TOC working d/c.    Subjective: Watching paw patrol  Objective: Vitals:   09/19/20 1927 09/19/20 2322 09/20/20 0425 09/20/20 0758  BP: 107/61 106/64 (!) 98/57 (!) 89/62  Pulse: 88 88 70 69  Resp: 18 14 16 18   Temp: 99.1 F (37.3 C) 98.1 F (36.7 C) 97.6 F (36.4 C) (!) 97.3 F (36.3 C)  TempSrc: Oral Oral Oral Oral  SpO2: 98% 98% 94% 99%  Weight:      Height:        Intake/Output Summary (Last 24 hours) at 09/20/2020 0836 Last data filed at 09/19/2020 1900 Gross per 24 hour  Intake 240 ml  Output 600 ml  Net -360 ml   Filed Weights   09/03/20 1613 09/03/20 2240  Weight: 40.8 kg 30.3 kg    Examination:  General: Appearance:    Thin female in no acute distress     Lungs:     respirations unlabored  Heart:    Normal heart rate.       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 -Continue supportive care, dysphagia 1 diet and aspiration precautions -needs safe D/C plan  Unwitnessed  fall in the hospital-patient was found on her knees next to the bed on 09/14/2020.  No injuries.  No changes mental status.  Patient's brother notified.  Per her brother, patient has a tendency to wiggle her way out of bed. -Fall precautions.   History of seizure disorder: Stable. -Continue with Ativan, Trileptal, Gabitril.   Anxiety/depression: Stable but difficult to assess. -Continue with fluoxetine and ativan.   Candida Vulvovaginitis -Treated with nystatin.   Tachycardia: Resolved.   Severe Protein Malnutrition;  Body mass index is 13.49 kg/m. Nutrition Problem: Severe Malnutrition Etiology: chronic illness (Rett syndrome) Signs/Symptoms: severe muscle depletion, severe fat depletion, percent weight loss (11.6% x 6 months) Percent weight loss: 11.6 % (x 6 months) Interventions: Ensure Enlive (each supplement provides 350kcal and 20 grams of protein)   DVT prophylaxis:  enoxaparin (LOVENOX) injection 30 mg Start: 09/03/20 2200  Code Status: DNR/DNI  Level of care: Med-Surg Status is: Inpatient  Remains inpatient appropriate because:Unsafe d/c plan  Dispo: The patient is from: Home              Anticipated d/c is to:  To be determined -- group home              Patient currently is medically stable to d/c.   Difficult to place patient Yes       Consultants:  None  Sch Meds:  Scheduled Meds:  baclofen  10 mg Oral TID   docusate  100 mg Oral BID   enoxaparin (LOVENOX) injection  30 mg Subcutaneous Q24H   feeding supplement  237 mL Oral TID BM   FLUoxetine  10 mg Oral Daily   LORazepam  1 mg Oral BID   nystatin cream   Topical BID   OXcarbazepine  600 mg Oral BID   tiaGABine  4 mg Oral BID   Continuous Infusions:  promethazine (PHENERGAN) injection (IM or IVPB)     PRN Meds:.acetaminophen **OR** acetaminophen, ondansetron **OR** ondansetron (ZOFRAN) IV, promethazine (PHENERGAN) injection (IM or IVPB)  Antimicrobials: Anti-infectives (From admission,  onward)    None        I have personally reviewed the following labs and images: CBC: No results for input(s): WBC, NEUTROABS, HGB, HCT, MCV, PLT in the last 168 hours.  BMP &GFR No results for input(s): NA, K, CL, CO2, GLUCOSE, BUN, CREATININE, CALCIUM, MG, PHOS in the last 168 hours.  Invalid input(s):  GFRCG  Estimated Creatinine Clearance: 42 mL/min (A) (by C-G formula based on SCr of 0.38 mg/dL (L)). Liver & Pancreas: No results for input(s): AST, ALT, ALKPHOS, BILITOT, PROT, ALBUMIN in the last 168 hours. No results for input(s): LIPASE, AMYLASE in the last 168 hours. No results for input(s): AMMONIA in the last 168 hours. Diabetic: No results for input(s): HGBA1C in the last 72 hours. No results for input(s): GLUCAP in the last 168 hours. Cardiac Enzymes: No results for input(s): CKTOTAL, CKMB, CKMBINDEX, TROPONINI in the last 168 hours. No results for input(s): PROBNP in the last 8760 hours. Coagulation Profile: No results for input(s): INR, PROTIME in the last 168 hours. Thyroid Function Tests: No results for input(s): TSH, T4TOTAL, FREET4, T3FREE, THYROIDAB in the last 72 hours. Lipid Profile: No results for input(s): CHOL, HDL, LDLCALC, TRIG, CHOLHDL, LDLDIRECT in the last 72 hours. Anemia Panel: No results for input(s): VITAMINB12, FOLATE, FERRITIN, TIBC, IRON, RETICCTPCT in the last 72 hours. Urine analysis:    Component Value Date/Time   COLORURINE YELLOW (A) 07/16/2020 1728   APPEARANCEUR CLOUDY (A) 07/16/2020 1728   LABSPEC 1.023 07/16/2020 1728   PHURINE 8.0 07/16/2020 1728   GLUCOSEU NEGATIVE 07/16/2020 1728   HGBUR NEGATIVE 07/16/2020 1728   BILIRUBINUR NEGATIVE 07/16/2020 1728   KETONESUR 5 (A) 07/16/2020 1728   PROTEINUR NEGATIVE 07/16/2020 1728   UROBILINOGEN 1.0 04/25/2013 1427   NITRITE NEGATIVE 07/16/2020 1728   LEUKOCYTESUR NEGATIVE 07/16/2020 1728   Sepsis Labs: Invalid input(s): PROCALCITONIN, LACTICIDVEN  Microbiology: Recent  Results (from the past 240 hour(s))  Resp Panel by RT-PCR (Flu A&B, Covid) Nasopharyngeal Swab     Status: None   Collection Time: 09/10/20  9:00 AM   Specimen: Nasopharyngeal Swab; Nasopharyngeal(NP) swabs in vial transport medium  Result Value Ref Range Status   SARS Coronavirus 2 by RT PCR NEGATIVE NEGATIVE Final    Comment: (NOTE) SARS-CoV-2 target nucleic acids are NOT DETECTED.  The SARS-CoV-2 RNA is generally detectable in upper respiratory specimens during the acute phase of infection. The lowest concentration of SARS-CoV-2 viral copies this assay can detect is 138 copies/mL. A negative result does not preclude SARS-Cov-2 infection and should not be used as the sole basis for treatment or other patient management decisions. A negative result may occur with  improper specimen collection/handling, submission of specimen other than nasopharyngeal swab, presence of viral mutation(s) within the areas targeted by this assay, and inadequate number of viral copies(<138  copies/mL). A negative result must be combined with clinical observations, patient history, and epidemiological information. The expected result is Negative.  Fact Sheet for Patients:  BloggerCourse.com  Fact Sheet for Healthcare Providers:  SeriousBroker.it  This test is no t yet approved or cleared by the [place] FDA and  has been authorized for detection and/or diagnosis of SARS-CoV-2 by FDA under an Emergency Use Authorization (EUA). This EUA will remain  in effect (meaning this test can be used) for the duration of the COVID-19 declaration under Section 564(b)(1) of the Act, 21 U.S.C.section 360bbb-3(b)(1), unless the authorization is terminated  or revoked sooner.       Influenza A by PCR NEGATIVE NEGATIVE Final   Influenza B by PCR NEGATIVE NEGATIVE Final    Comment: (NOTE) The Xpert Xpress SARS-CoV-2/FLU/RSV plus assay is intended as an aid in the  diagnosis of influenza from Nasopharyngeal swab specimens and should not be used as a sole basis for treatment. Nasal washings and aspirates are unacceptable for Xpert Xpress SARS-CoV-2/FLU/RSV testing.  Fact Sheet for Patients: BloggerCourse.com  Fact Sheet for Healthcare Providers: SeriousBroker.it  This test is not yet approved or cleared by the [place] FDA and has been authorized for detection and/or diagnosis of SARS-CoV-2 by FDA under an Emergency Use Authorization (EUA). This EUA will remain in effect (meaning this test can be used) for the duration of the COVID-19 declaration under Section 564(b)(1) of the Act, 21 U.S.C. section 360bbb-3(b)(1), unless the authorization is terminated or revoked.  Performed at [facility], [address] [place], [place] [address]     Radiology Studies: No results found.    Marlin Canary DO [facility]  If 7PM-7AM, please contact night-coverage [email or web address] 09/20/2020, 8:36 AM

## 2020-09-21 DIAGNOSIS — R627 Adult failure to thrive: Secondary | ICD-10-CM | POA: Diagnosis not present

## 2020-09-21 NOTE — Progress Notes (Signed)
PROGRESS NOTE  Alicia Duncan YYT:[id number] DOB: [birth year]   PCP: [name], MD  Patient is from: Home with home care.   Totally dependent for ADLs at baseline.  DOA: 09/03/2020 LOS: 16  Chief complaints:  Chief Complaint  Patient presents with   social work    Brief Narrative / Interim history: 45 year old F with PMH of Rett syndrome, cognitive impairment, developmental delay, nonverbal, seizure disorder and debility with complete dependence for ADLs brought to ED by EMS due to unsafe home environment.  Patient's caregiver went on vacation for several days and family at home have not been able to adequately take care of the patient.  Reportedly, patient's father who is advanced dementia.  Patient's brother with schizophrenia.  Patient is medically stable but has no safe disposition yet.  Patient's insurance and TOC working d/c.   Last note from [facility]: Angie with [facility] will come to the hospital to do an evaluation on the patient to see if she would be a good fit for her facility.  Hopefully she will be able to come out early this week.    Subjective: Drinking from a baby bottle  Objective: Vitals:   09/20/20 1948 09/21/20 0033 09/21/20 0342 09/21/20 0742  BP: 128/76 95/68 103/69 90/67  Pulse: 89 76 75 92  Resp: 16 18 16 17   Temp: 98.4 F (36.9 C) 97.6 F (36.4 C) 97.8 F (36.6 C) (!) 97.5 F (36.4 C)  TempSrc: Oral  Oral Oral  SpO2: 99% 100% 98% 90%  Weight:      Height:        Intake/Output Summary (Last 24 hours) at 09/21/2020 0755 Last data filed at 09/20/2020 1859 Gross per 24 hour  Intake 180 ml  Output --  Net 180 ml   Filed Weights   09/03/20 1613 09/03/20 2240  Weight: 40.8 kg 30.3 kg    Examination:   General: Appearance:    Thin female in no acute distress     Lungs:     respirations unlabored  Heart:    Normal heart rate.        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 -Continue supportive care, dysphagia 1 diet and aspiration precautions -needs safe D/C plan  Unwitnessed fall in the hospital-patient was found on her knees next to the bed on 09/14/2020.  No injuries.  No changes mental status.  Patient's brother notified.  Per her brother, patient has a tendency to wiggle her way out of bed. -Fall precautions.   History of seizure disorder: Stable. -Continue with Ativan, Trileptal, Gabitril.   Anxiety/depression: Stable but difficult to assess. -Continue with fluoxetine and ativan.   Candida Vulvovaginitis -Treated with nystatin.   Tachycardia: Resolved.   Severe Protein Malnutrition;  Body mass index is 13.49 kg/m. Nutrition Problem: Severe Malnutrition Etiology: chronic illness (Rett syndrome) Signs/Symptoms: severe muscle depletion, severe fat depletion, percent weight loss (11.6% x 6 months) Percent weight loss: 11.6 % (x 6 months) Interventions: Ensure Enlive (each supplement provides 350kcal and 20 grams of protein)   DVT prophylaxis:  enoxaparin (LOVENOX) injection 30 mg Start: 09/03/20 2200  Code Status: DNR  Level of care: Med-Surg Status is: Inpatient  Remains inpatient appropriate because:Unsafe d/c plan  Dispo: The patient is from: Home              Anticipated d/c is to:  To be determined -- group home  Patient currently is medically stable to d/c.   Difficult to place patient Yes       Consultants:  None   Sch Meds:  Scheduled Meds:  baclofen  10 mg Oral TID   docusate  100 mg Oral BID   enoxaparin (LOVENOX) injection  30 mg Subcutaneous Q24H   feeding supplement  237 mL Oral TID BM   FLUoxetine  10 mg Oral Daily   LORazepam  1 mg Oral BID   nystatin cream   Topical BID   OXcarbazepine  600 mg Oral BID   tiaGABine  4 mg Oral BID   Continuous Infusions:  promethazine (PHENERGAN) injection (IM or IVPB)     PRN  Meds:.acetaminophen **OR** acetaminophen, ondansetron **OR** ondansetron (ZOFRAN) IV, promethazine (PHENERGAN) injection (IM or IVPB)  Antimicrobials: Anti-infectives (From admission, onward)    None        I have personally reviewed the following labs and images: CBC: No results for input(s): WBC, NEUTROABS, HGB, HCT, MCV, PLT in the last 168 hours.  BMP &GFR No results for input(s): NA, K, CL, CO2, GLUCOSE, BUN, CREATININE, CALCIUM, MG, PHOS in the last 168 hours.  Invalid input(s):  GFRCG  Estimated Creatinine Clearance: 42 mL/min (A) (by C-G formula based on SCr of 0.38 mg/dL (L)). Liver & Pancreas: No results for input(s): AST, ALT, ALKPHOS, BILITOT, PROT, ALBUMIN in the last 168 hours. No results for input(s): LIPASE, AMYLASE in the last 168 hours. No results for input(s): AMMONIA in the last 168 hours. Diabetic: No results for input(s): HGBA1C in the last 72 hours. No results for input(s): GLUCAP in the last 168 hours. Cardiac Enzymes: No results for input(s): CKTOTAL, CKMB, CKMBINDEX, TROPONINI in the last 168 hours. No results for input(s): PROBNP in the last 8760 hours. Coagulation Profile: No results for input(s): INR, PROTIME in the last 168 hours. Thyroid Function Tests: No results for input(s): TSH, T4TOTAL, FREET4, T3FREE, THYROIDAB in the last 72 hours. Lipid Profile: No results for input(s): CHOL, HDL, LDLCALC, TRIG, CHOLHDL, LDLDIRECT in the last 72 hours. Anemia Panel: No results for input(s): VITAMINB12, FOLATE, FERRITIN, TIBC, IRON, RETICCTPCT in the last 72 hours. Urine analysis:    Component Value Date/Time   COLORURINE YELLOW (A) 07/16/2020 1728   APPEARANCEUR CLOUDY (A) 07/16/2020 1728   LABSPEC 1.023 07/16/2020 1728   PHURINE 8.0 07/16/2020 1728   GLUCOSEU NEGATIVE 07/16/2020 1728   HGBUR NEGATIVE 07/16/2020 1728   BILIRUBINUR NEGATIVE 07/16/2020 1728   KETONESUR 5 (A) 07/16/2020 1728   PROTEINUR NEGATIVE 07/16/2020 1728   UROBILINOGEN 1.0  04/25/2013 1427   NITRITE NEGATIVE 07/16/2020 1728   LEUKOCYTESUR NEGATIVE 07/16/2020 1728   Sepsis Labs: Invalid input(s): PROCALCITONIN, LACTICIDVEN  Microbiology: No results found for this or any previous visit (from the past 240 hour(s)).   Radiology Studies: No results found.    Marlin Canary DO [facility]  If 7PM-7AM, please contact night-coverage [email or web address] 09/21/2020, 7:55 AM

## 2020-09-22 DIAGNOSIS — R627 Adult failure to thrive: Secondary | ICD-10-CM | POA: Diagnosis not present

## 2020-09-22 MED ORDER — BISACODYL 10 MG RE SUPP
10.0000 mg | Freq: Every day | RECTAL | Status: DC | PRN
  Administered 2020-09-26 – 2020-10-30 (×5): 10 mg via RECTAL
  Filled 2020-09-22 (×5): qty 1

## 2020-09-22 MED ORDER — POLYETHYLENE GLYCOL 3350 17 G PO PACK
17.0000 g | PACK | Freq: Every day | ORAL | Status: DC
  Administered 2020-09-22 – 2020-09-24 (×2): 17 g via ORAL
  Filled 2020-09-22 (×2): qty 1

## 2020-09-22 NOTE — TOC Progression Note (Signed)
Transition of Care [facility]) - Progression Note    Patient Details  Name: Alicia Duncan MRN: [id number] Date of Birth: 02-18-1976  Transition of [facility]) CM/SW Contact  Liliana Cline, LCSW Phone Number: 09/22/2020, 9:40 AM  Clinical Narrative:   Left VM for [name] with [facility] requesting update.    Expected Discharge Plan: Group Home Barriers to Discharge: Other (must enter comment) (patient needs a specialized care home placement)  Expected Discharge Plan and Services Expected Discharge Plan: Group Home   Discharge Planning Services: CM Consult Post Acute Care Choice: Nursing Home Living arrangements for the past 2 months: Single Family Home Expected Discharge Date: 09/13/20               DME Arranged: N/A DME Agency: NA         HH Agency: NA         Social Determinants of Health (SDOH) Interventions    Readmission Risk Interventions No flowsheet data found.

## 2020-09-22 NOTE — Progress Notes (Signed)
PROGRESS NOTE  RYN PEINE BDZ:[id number] DOB: 11/05/1975   PCP: [name], MD  Patient is from: Home with home care.   Totally dependent for ADLs at baseline.  DOA: 09/03/2020 LOS: 17  Chief complaints:  Chief Complaint  Patient presents with   social work    Brief Narrative / Interim history: 45 year old F with PMH of Rett syndrome, cognitive impairment, developmental delay, nonverbal, seizure disorder and debility with complete dependence for ADLs brought to ED by EMS due to unsafe home environment.  Patient's caregiver went on vacation for several days and family at home have not been able to adequately take care of the patient.  Reportedly, patient's father who is advanced dementia.  Patient's brother with schizophrenia.  Patient is medically stable but has no safe disposition yet.  Patient's insurance and TOC working d/c.   Last note from [facility]: Angie with [facility] will come to the hospital to do an evaluation on the patient to see if she would be a good fit for her facility.  Hopefully she will be able to come out early this week.    Subjective: No overnight events reported Last BM documented 6/17  Objective: Vitals:   09/21/20 2348 09/22/20 0506 09/22/20 0732 09/22/20 0735  BP: (!) 95/57 105/73 (!) 83/50 (!) 96/53  Pulse: 85 69 66 62  Resp: 16 16 16    Temp: 97.8 F (36.6 C) (!) 97.3 F (36.3 C) 97.9 F (36.6 C)   TempSrc:  Oral    SpO2: 97% 99% 98%   Weight:      Height:        Intake/Output Summary (Last 24 hours) at 09/22/2020 1002 Last data filed at 09/21/2020 2100 Gross per 24 hour  Intake 240 ml  Output --  Net 240 ml   Filed Weights   09/03/20 1613 09/03/20 2240  Weight: 40.8 kg 30.3 kg    Examination:   General: Appearance:    Thin female in no acute distress     Lungs:     respirations unlabored  Heart:    Normal heart rate.        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 -Continue supportive care, dysphagia 1 diet and aspiration precautions -needs safe D/C plan  Unwitnessed fall in the hospital-patient was found on her knees next to the bed on 09/14/2020.  No injuries.  No changes mental status.  Patient's brother notified.  Per her brother, patient has a tendency to wiggle her way out of bed. -Fall precautions.   History of seizure disorder: Stable. -Continue with Ativan, Trileptal, Gabitril.   Anxiety/depression: Stable but difficult to assess. -Continue with fluoxetine and ativan.   Candida Vulvovaginitis -Treated with nystatin.   Tachycardia: Resolved.   Severe Protein Malnutrition;  Body mass index is 13.49 kg/m. Nutrition Problem: Severe Malnutrition Etiology: chronic illness (Rett syndrome) Signs/Symptoms: severe muscle depletion, severe fat depletion, percent weight loss (11.6% x 6 months) Percent weight loss: 11.6 % (x 6 months) Interventions: Ensure Enlive (each supplement provides 350kcal and 20 grams of protein)   DVT prophylaxis:  enoxaparin (LOVENOX) injection 30 mg Start: 09/03/20 2200  Code Status: DNR  Level of care: Med-Surg Status is: Inpatient  Remains inpatient appropriate because:Unsafe d/c plan  Dispo: The patient is from: Home              Anticipated d/c is to:  To be determined -- group  home              Patient currently is medically stable to d/c.   Difficult to place patient Yes       Consultants:  None   Sch Meds:  Scheduled Meds:  baclofen  10 mg Oral TID   docusate  100 mg Oral BID   enoxaparin (LOVENOX) injection  30 mg Subcutaneous Q24H   feeding supplement  237 mL Oral TID BM   FLUoxetine  10 mg Oral Daily   LORazepam  1 mg Oral BID   nystatin cream   Topical BID   OXcarbazepine  600 mg Oral BID   polyethylene glycol  17 g Oral Daily   tiaGABine  4 mg Oral BID   Continuous Infusions:   promethazine (PHENERGAN) injection (IM or IVPB)     PRN Meds:.acetaminophen **OR** acetaminophen, bisacodyl, ondansetron **OR** ondansetron (ZOFRAN) IV, promethazine (PHENERGAN) injection (IM or IVPB)  Antimicrobials: Anti-infectives (From admission, onward)    None        I have personally reviewed the following labs and images: CBC: No results for input(s): WBC, NEUTROABS, HGB, HCT, MCV, PLT in the last 168 hours.  BMP &GFR No results for input(s): NA, K, CL, CO2, GLUCOSE, BUN, CREATININE, CALCIUM, MG, PHOS in the last 168 hours.  Invalid input(s):  GFRCG  Estimated Creatinine Clearance: 42 mL/min (A) (by C-G formula based on SCr of 0.38 mg/dL (L)). Liver & Pancreas: No results for input(s): AST, ALT, ALKPHOS, BILITOT, PROT, ALBUMIN in the last 168 hours. No results for input(s): LIPASE, AMYLASE in the last 168 hours. No results for input(s): AMMONIA in the last 168 hours. Diabetic: No results for input(s): HGBA1C in the last 72 hours. No results for input(s): GLUCAP in the last 168 hours. Cardiac Enzymes: No results for input(s): CKTOTAL, CKMB, CKMBINDEX, TROPONINI in the last 168 hours. No results for input(s): PROBNP in the last 8760 hours. Coagulation Profile: No results for input(s): INR, PROTIME in the last 168 hours. Thyroid Function Tests: No results for input(s): TSH, T4TOTAL, FREET4, T3FREE, THYROIDAB in the last 72 hours. Lipid Profile: No results for input(s): CHOL, HDL, LDLCALC, TRIG, CHOLHDL, LDLDIRECT in the last 72 hours. Anemia Panel: No results for input(s): VITAMINB12, FOLATE, FERRITIN, TIBC, IRON, RETICCTPCT in the last 72 hours. Urine analysis:    Component Value Date/Time   COLORURINE YELLOW (A) 07/16/2020 1728   APPEARANCEUR CLOUDY (A) 07/16/2020 1728   LABSPEC 1.023 07/16/2020 1728   PHURINE 8.0 07/16/2020 1728   GLUCOSEU NEGATIVE 07/16/2020 1728   HGBUR NEGATIVE 07/16/2020 1728   BILIRUBINUR NEGATIVE 07/16/2020 1728   KETONESUR 5 (A)  07/16/2020 1728   PROTEINUR NEGATIVE 07/16/2020 1728   UROBILINOGEN 1.0 04/25/2013 1427   NITRITE NEGATIVE 07/16/2020 1728   LEUKOCYTESUR NEGATIVE 07/16/2020 1728   Sepsis Labs: Invalid input(s): PROCALCITONIN, LACTICIDVEN  Microbiology: No results found for this or any previous visit (from the past 240 hour(s)).   Radiology Studies: No results found.    Marlin Canary DO [facility]  If 7PM-7AM, please contact night-coverage [email or web address] 09/22/2020, 10:02 AM

## 2020-09-23 DIAGNOSIS — R627 Adult failure to thrive: Secondary | ICD-10-CM | POA: Diagnosis not present

## 2020-09-23 MED ORDER — LORAZEPAM 2 MG/ML IJ SOLN
0.2500 mg | Freq: Once | INTRAMUSCULAR | Status: AC
  Administered 2020-09-23: 21:00:00 0.25 mg via INTRAVENOUS
  Filled 2020-09-23: qty 1

## 2020-09-23 NOTE — Progress Notes (Signed)
PROGRESS NOTE  Alicia Duncan HWE:[id number] DOB: 1975/11/09   PCP: [name], MD  Patient is from: Home with home care.   Totally dependent for ADLs at baseline.  DOA: 09/03/2020 LOS: 18  Chief complaints:  Chief Complaint  Patient presents with   social work    Brief Narrative / Interim history: 45 year old F with PMH of Rett syndrome, cognitive impairment, developmental delay, nonverbal, seizure disorder and debility with complete dependence for ADLs brought to ED by EMS due to unsafe home environment.  Patient's caregiver went on vacation for several days and family at home have not been able to adequately take care of the patient.  Reportedly, patient's father who is advanced dementia.  Patient's brother with schizophrenia.  Patient is medically stable but has no safe disposition yet.  Patient's insurance and TOC working d/c.   Last note from [facility]: Angie with [facility] will come to the hospital to do an evaluation on the patient to see if she would be a good fit for her facility.  Hopefully she will be able to come out early this week.    Subjective: Again, no events overnight  Objective: Vitals:   09/22/20 1608 09/22/20 2014 09/23/20 0450 09/23/20 0725  BP: 107/75 104/76 93/61 98/68   Pulse: 95 89 96 77  Resp: 17 16 16 18   Temp: 98 F (36.7 C) 99.1 F (37.3 C) 98.6 F (37 C) 97.8 F (36.6 C)  TempSrc:  Axillary Axillary   SpO2: 95% 97% 97% 98%  Weight:      Height:        Intake/Output Summary (Last 24 hours) at 09/23/2020 1011 Last data filed at 09/23/2020 1003 Gross per 24 hour  Intake 180 ml  Output --  Net 180 ml   Filed Weights   09/03/20 1613 09/03/20 2240  Weight: 40.8 kg 30.3 kg    Examination:  In bed, NAD     Assessment & Plan:  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 -Continue supportive care, dysphagia 1 diet and  aspiration precautions -needs safe D/C plan  Unwitnessed fall in the hospital-patient was found on her knees next to the bed on 09/14/2020.  No injuries.  No changes mental status.  Patient's brother notified.  Per her brother, patient has a tendency to wiggle her way out of bed. -Fall precautions.   History of seizure disorder: Stable. -Continue with Ativan, Trileptal, Gabitril.   Anxiety/depression: Stable but difficult to assess. -Continue with fluoxetine and ativan.   Candida Vulvovaginitis -Treated with nystatin.   Tachycardia: Resolved.   Severe Protein Malnutrition;  Body mass index is 13.49 kg/m. Nutrition Problem: Severe Malnutrition Etiology: chronic illness (Rett syndrome) Signs/Symptoms: severe muscle depletion, severe fat depletion, percent weight loss (11.6% x 6 months) Percent weight loss: 11.6 % (x 6 months) Interventions: Ensure Enlive (each supplement provides 350kcal and 20 grams of protein)   DVT prophylaxis:  enoxaparin (LOVENOX) injection 30 mg Start: 09/03/20 2200  Code Status: DNR  Level of care: Med-Surg Status is: Inpatient  Remains inpatient appropriate because:Unsafe d/c plan  Dispo: The patient is from: Home              Anticipated d/c is to:  To be determined -- group home              Patient currently is medically stable to d/c.   Difficult to place patient Yes       Consultants:  None   Sch Meds:  Scheduled Meds:  baclofen  10 mg Oral TID   docusate  100 mg Oral BID   enoxaparin (LOVENOX) injection  30 mg Subcutaneous Q24H   feeding supplement  237 mL Oral TID BM   FLUoxetine  10 mg Oral Daily   LORazepam  1 mg Oral BID   nystatin cream   Topical BID   OXcarbazepine  600 mg Oral BID   polyethylene glycol  17 g Oral Daily   tiaGABine  4 mg Oral BID   Continuous Infusions:  promethazine (PHENERGAN) injection (IM or IVPB)     PRN Meds:.acetaminophen **OR** acetaminophen, bisacodyl, ondansetron **OR** ondansetron (ZOFRAN)  IV, promethazine (PHENERGAN) injection (IM or IVPB)  Antimicrobials: Anti-infectives (From admission, onward)    None        I have personally reviewed the following labs and images: CBC: No results for input(s): WBC, NEUTROABS, HGB, HCT, MCV, PLT in the last 168 hours.  BMP &GFR No results for input(s): NA, K, CL, CO2, GLUCOSE, BUN, CREATININE, CALCIUM, MG, PHOS in the last 168 hours.  Invalid input(s):  GFRCG  Estimated Creatinine Clearance: 42 mL/min (A) (by C-G formula based on SCr of 0.38 mg/dL (L)). Liver & Pancreas: No results for input(s): AST, ALT, ALKPHOS, BILITOT, PROT, ALBUMIN in the last 168 hours. No results for input(s): LIPASE, AMYLASE in the last 168 hours. No results for input(s): AMMONIA in the last 168 hours. Diabetic: No results for input(s): HGBA1C in the last 72 hours. No results for input(s): GLUCAP in the last 168 hours. Cardiac Enzymes: No results for input(s): CKTOTAL, CKMB, CKMBINDEX, TROPONINI in the last 168 hours. No results for input(s): PROBNP in the last 8760 hours. Coagulation Profile: No results for input(s): INR, PROTIME in the last 168 hours. Thyroid Function Tests: No results for input(s): TSH, T4TOTAL, FREET4, T3FREE, THYROIDAB in the last 72 hours. Lipid Profile: No results for input(s): CHOL, HDL, LDLCALC, TRIG, CHOLHDL, LDLDIRECT in the last 72 hours. Anemia Panel: No results for input(s): VITAMINB12, FOLATE, FERRITIN, TIBC, IRON, RETICCTPCT in the last 72 hours. Urine analysis:    Component Value Date/Time   COLORURINE YELLOW (A) 07/16/2020 1728   APPEARANCEUR CLOUDY (A) 07/16/2020 1728   LABSPEC 1.023 07/16/2020 1728   PHURINE 8.0 07/16/2020 1728   GLUCOSEU NEGATIVE 07/16/2020 1728   HGBUR NEGATIVE 07/16/2020 1728   BILIRUBINUR NEGATIVE 07/16/2020 1728   KETONESUR 5 (A) 07/16/2020 1728   PROTEINUR NEGATIVE 07/16/2020 1728   UROBILINOGEN 1.0 04/25/2013 1427   NITRITE NEGATIVE 07/16/2020 1728   LEUKOCYTESUR NEGATIVE  07/16/2020 1728   Sepsis Labs: Invalid input(s): PROCALCITONIN, LACTICIDVEN  Microbiology: No results found for this or any previous visit (from the past 240 hour(s)).   Radiology Studies: No results found.    Marlin Canary DO [facility]  If 7PM-7AM, please contact night-coverage [email or web address] 09/23/2020, 10:11 AM

## 2020-09-24 ENCOUNTER — Inpatient Hospital Stay: Payer: Medicare Other

## 2020-09-24 ENCOUNTER — Encounter: Payer: Self-pay | Admitting: Internal Medicine

## 2020-09-24 DIAGNOSIS — R627 Adult failure to thrive: Secondary | ICD-10-CM | POA: Diagnosis not present

## 2020-09-24 DIAGNOSIS — R419 Unspecified symptoms and signs involving cognitive functions and awareness: Secondary | ICD-10-CM

## 2020-09-24 DIAGNOSIS — R112 Nausea with vomiting, unspecified: Secondary | ICD-10-CM

## 2020-09-24 DIAGNOSIS — K5904 Chronic idiopathic constipation: Secondary | ICD-10-CM

## 2020-09-24 LAB — CBC WITH DIFFERENTIAL/PLATELET
Abs Immature Granulocytes: 0.03 10*3/uL (ref 0.00–0.07)
Basophils Absolute: 0.1 10*3/uL (ref 0.0–0.1)
Basophils Relative: 1 %
Eosinophils Absolute: 0 10*3/uL (ref 0.0–0.5)
Eosinophils Relative: 0 %
HCT: 41.3 % (ref 36.0–46.0)
Hemoglobin: 13.1 g/dL (ref 12.0–15.0)
Immature Granulocytes: 0 %
Lymphocytes Relative: 21 %
Lymphs Abs: 1.8 10*3/uL (ref 0.7–4.0)
MCH: 29.4 pg (ref 26.0–34.0)
MCHC: 31.7 g/dL (ref 30.0–36.0)
MCV: 92.6 fL (ref 80.0–100.0)
Monocytes Absolute: 0.7 10*3/uL (ref 0.1–1.0)
Monocytes Relative: 9 %
Neutro Abs: 5.9 10*3/uL (ref 1.7–7.7)
Neutrophils Relative %: 69 %
Platelets: 308 10*3/uL (ref 150–400)
RBC: 4.46 MIL/uL (ref 3.87–5.11)
RDW: 15.9 % — ABNORMAL HIGH (ref 11.5–15.5)
WBC: 8.6 10*3/uL (ref 4.0–10.5)
nRBC: 0 % (ref 0.0–0.2)

## 2020-09-24 LAB — COMPREHENSIVE METABOLIC PANEL
ALT: 58 U/L — ABNORMAL HIGH (ref 0–44)
AST: 38 U/L (ref 15–41)
Albumin: 3.4 g/dL — ABNORMAL LOW (ref 3.5–5.0)
Alkaline Phosphatase: 38 U/L (ref 38–126)
Anion gap: 7 (ref 5–15)
BUN: 22 mg/dL — ABNORMAL HIGH (ref 6–20)
CO2: 27 mmol/L (ref 22–32)
Calcium: 8.8 mg/dL — ABNORMAL LOW (ref 8.9–10.3)
Chloride: 103 mmol/L (ref 98–111)
Creatinine, Ser: 0.49 mg/dL (ref 0.44–1.00)
GFR, Estimated: >60 mL/min (ref >60)
Glucose, Bld: 113 mg/dL — ABNORMAL HIGH (ref 70–99)
Potassium: 4.2 mmol/L (ref 3.5–5.1)
Sodium: 137 mmol/L (ref 135–145)
Total Bilirubin: 0.4 mg/dL (ref 0.3–1.2)
Total Protein: 6.8 g/dL (ref 6.5–8.1)

## 2020-09-24 LAB — MAGNESIUM: Magnesium: 1.8 mg/dL (ref 1.7–2.4)

## 2020-09-24 LAB — PHOSPHORUS: Phosphorus: 4.1 mg/dL (ref 2.5–4.6)

## 2020-09-24 MED ORDER — ONDANSETRON HCL 4 MG PO TABS
4.0000 mg | ORAL_TABLET | Freq: Four times a day (QID) | ORAL | Status: DC | PRN
  Administered 2020-10-02 (×2): 4 mg
  Filled 2020-09-24 (×2): qty 1

## 2020-09-24 MED ORDER — PANTOPRAZOLE SODIUM 40 MG IV SOLR
40.0000 mg | INTRAVENOUS | Status: DC
  Administered 2020-09-24 – 2020-09-28 (×5): 40 mg via INTRAVENOUS
  Filled 2020-09-24 (×5): qty 40

## 2020-09-24 MED ORDER — ACETAMINOPHEN 325 MG PO TABS: 650.0000 mg | ORAL_TABLET | Freq: Four times a day (QID) | ORAL | Status: DC | PRN

## 2020-09-24 MED ORDER — ONDANSETRON HCL 4 MG/2ML IJ SOLN
4.0000 mg | Freq: Four times a day (QID) | INTRAMUSCULAR | Status: DC | PRN
  Administered 2020-10-01: 4 mg via INTRAVENOUS
  Filled 2020-09-24: qty 2

## 2020-09-24 MED ORDER — IOHEXOL 300 MG/ML  SOLN
50.0000 mL | Freq: Once | INTRAMUSCULAR | Status: AC | PRN
  Administered 2020-09-24: 08:00:00 50 mL via INTRAVENOUS

## 2020-09-24 MED ORDER — BACLOFEN 10 MG PO TABS
10.0000 mg | ORAL_TABLET | Freq: Three times a day (TID) | ORAL | Status: DC
  Administered 2020-09-24 – 2020-10-05 (×33): 10 mg
  Filled 2020-09-24 (×35): qty 1

## 2020-09-24 MED ORDER — SORBITOL 70 % SOLN
300.0000 mL | TOPICAL_OIL | Freq: Once | ORAL | Status: AC
  Administered 2020-09-24: 300 mL via RECTAL
  Filled 2020-09-24: qty 90

## 2020-09-24 MED ORDER — MAGNESIUM CITRATE PO SOLN
1.0000 | Freq: Once | ORAL | Status: AC
  Administered 2020-09-25: 18:00:00 1 via ORAL
  Filled 2020-09-24 (×2): qty 296

## 2020-09-24 MED ORDER — FLUOXETINE HCL 20 MG/5ML PO SOLN
10.0000 mg | Freq: Every day | ORAL | Status: DC
  Administered 2020-09-25 – 2020-10-05 (×11): 10 mg
  Filled 2020-09-24 (×6): qty 2.5
  Filled 2020-09-24: qty 5
  Filled 2020-09-24 (×4): qty 2.5
  Filled 2020-09-24: qty 5

## 2020-09-24 MED ORDER — POLYETHYLENE GLYCOL 3350 17 G PO PACK: 17.0000 g | PACK | Freq: Every day | ORAL | Status: DC

## 2020-09-24 MED ORDER — LORAZEPAM 2 MG/ML PO CONC
1.0000 mg | Freq: Two times a day (BID) | ORAL | Status: DC
  Administered 2020-09-24 – 2020-10-05 (×22): 1 mg
  Filled 2020-09-24 (×22): qty 1

## 2020-09-24 MED ORDER — TIAGABINE HCL 4 MG PO TABS
4.0000 mg | ORAL_TABLET | Freq: Two times a day (BID) | ORAL | Status: DC
  Administered 2020-09-24 – 2020-10-05 (×22): 4 mg
  Filled 2020-09-24 (×25): qty 1

## 2020-09-24 MED ORDER — ACETAMINOPHEN 650 MG RE SUPP: 650.0000 mg | Freq: Four times a day (QID) | RECTAL | Status: DC | PRN

## 2020-09-24 MED ORDER — OXCARBAZEPINE 300 MG/5ML PO SUSP
600.0000 mg | Freq: Two times a day (BID) | ORAL | Status: DC
  Administered 2020-09-24 – 2020-09-29 (×11): 600 mg
  Filled 2020-09-24 (×13): qty 10

## 2020-09-24 MED ORDER — DOCUSATE SODIUM 50 MG/5ML PO LIQD
100.0000 mg | Freq: Two times a day (BID) | ORAL | Status: DC
  Administered 2020-09-24 – 2020-10-05 (×22): 100 mg
  Filled 2020-09-24 (×23): qty 10

## 2020-09-24 MED ORDER — ENSURE ENLIVE PO LIQD
237.0000 mL | Freq: Three times a day (TID) | ORAL | Status: DC
  Administered 2020-09-24 – 2020-09-30 (×19): 237 mL

## 2020-09-24 MED ORDER — POLYETHYLENE GLYCOL 3350 17 G PO PACK
17.0000 g | PACK | Freq: Two times a day (BID) | ORAL | Status: DC
  Administered 2020-09-24 – 2020-10-03 (×19): 17 g
  Filled 2020-09-24 (×19): qty 1

## 2020-09-24 MED ORDER — FLEET ENEMA 7-19 GM/118ML RE ENEM
1.0000 | ENEMA | Freq: Every day | RECTAL | Status: DC
  Administered 2020-09-25 – 2020-09-30 (×2): 1 via RECTAL

## 2020-09-24 NOTE — TOC Progression Note (Signed)
Transition of Care [facility]) - Progression Note    Patient Details  Name: Alicia Duncan MRN: [id number] Date of Birth: [birth year]  Transition of Care [facility]) CM/SW Contact  Hetty Ely, RN Phone Number: 09/24/2020, 12:44 PM  Clinical Narrative:  Doreatha Martin health worker Clarita, [phone] for update on placement, no answer left voice message to return call.     Expected Discharge Plan: Group Home Barriers to Discharge: Other (must enter comment) (patient needs a specialized care home placement)  Expected Discharge Plan and Services Expected Discharge Plan: Group Home   Discharge Planning Services: CM Consult Post Acute Care Choice: Nursing Home Living arrangements for the past 2 months: Single Family Home Expected Discharge Date: 09/13/20               DME Arranged: N/A DME Agency: NA         HH Agency: NA         Social Determinants of Health (SDOH) Interventions    Readmission Risk Interventions No flowsheet data found.

## 2020-09-24 NOTE — Progress Notes (Signed)
Pt given SMOG enema. Tolerated approx. half of enema with no problem. Large amount of soft brown stool pushed from rectum along with fluids from enema. Pt still refused meds in bottle after BM. MD made aware.

## 2020-09-24 NOTE — Consult Note (Signed)
[facility] SURGICAL CONSULTATION NOTE (initial) - cpt: [phone]   HISTORY OF PRESENT ILLNESS (HPI):  History limited secondary to patient's cognitive disability and non-verbal status at baseline. History primarily obtained through chart review and discussion with other members of the medical team.   45 y.o. female presented to [facility] ED initially on 05/30 secondary to concerns over unsafe living environment. Patient with a history of Rett syndrome, severe cognitive disability, seizure disorder nonverbal at baseline. Reportedly, patient's normal care giver was on vacation for several days, and there were concerns that her living situation had become unsafe. Patient's father has advanced dementia and patient's brother lives in [place] and has schizophrenia. She was admitted for placement and social work.  I seems that last night patient had a few episodes of brown emesis. She underwent KUB and this was concerning for possible ileus with significant rectal stool burden. She did have labs this morning which were reassuring. CT Abdomen/Pelvis was also obtained this morning concerning of diffuse small bowel dilation and significant stool burden.   Surgery is consulted by hospitalist provider Manuela Schwartz, NP in this context for evaluation and management of possible ileus in setting of constipation.   PAST MEDICAL HISTORY (PMH):  Past Medical History:  Diagnosis Date   Dystonia    Rett syndrome    Seizures (HCC)      PAST SURGICAL HISTORY (PSH):  No past surgical history on file.   MEDICATIONS:  Prior to Admission medications   Medication Sig Start Date End Date Taking? Authorizing Provider  FLUoxetine (PROZAC) 10 MG capsule Take 10 mg by mouth daily. 10/28/19  Yes [provider]  LORazepam (ATIVAN) 1 MG tablet Take 1 tablet (1 mg total) by mouth 2 (two) times daily. 03/15/20  Yes Glean Salvo, NP  omeprazole (PRILOSEC) 20 MG capsule Take 20 mg by mouth daily.    Yes [provider]  Oxcarbazepine (TRILEPTAL) 300 MG tablet Take 2 tablets (600 mg total) by mouth 2 (two) times daily. 03/15/20  Yes Glean Salvo, NP  tiaGABine (GABITRIL) 4 MG tablet Take 1 tablet (4 mg total) by mouth 2 (two) times daily. 03/15/20  Yes Glean Salvo, NP  baclofen (LIORESAL) 10 MG tablet Take 1 tablet (10 mg total) by mouth 3 (three) times daily. 09/11/20   Regalado, Belkys A, MD  docusate (COLACE) 50 MG/5ML liquid Take 10 mLs (100 mg total) by mouth 2 (two) times daily. 09/11/20   Regalado, Belkys A, MD  FLUoxetine (PROZAC) 20 MG/5ML solution Take 2.5 mLs (10 mg total) by mouth daily. 09/11/20   Regalado, Belkys A, MD  LORazepam (ATIVAN) 2 MG/ML concentrated solution Take 0.5 mLs (1 mg total) by mouth 2 (two) times daily. 09/11/20   Regalado, Belkys A, MD  nystatin cream (MYCOSTATIN) Apply topically 2 (two) times daily. 09/11/20   Regalado, Belkys A, MD  OXcarbazepine (TRILEPTAL) 300 MG/5ML suspension Take 10 mLs (600 mg total) by mouth 2 (two) times daily. 09/11/20   Regalado, Belkys A, MD  tiaGABine (GABITRIL) 4 MG tablet Take 1 tablet (4 mg total) by mouth 2 (two) times daily. 09/11/20   Regalado, Belkys A, MD     ALLERGIES:  Allergies  Allergen Reactions   Benadryl [Diphenhydramine]    Penicillins Other (See Comments)    Has patient had a PCN reaction causing immediate rash, facial/tongue/throat swelling, SOB or lightheadedness with hypotension: Unknown Has patient had a PCN reaction causing severe rash involving mucus membranes or skin necrosis: Unknown Has patient  had a PCN reaction that required hospitalization: Unknown Has patient had a PCN reaction occurring within the last 10 years: Unknown If all of the above answers are "NO", then may proceed with Cephalosporin use.      SOCIAL HISTORY:  Social History   Socioeconomic History   Marital status: Single    Spouse name: Not on file   Number of children: Not on file   Years of education: Not on file   Highest  education level: Not on file  Occupational History   Not on file  Tobacco Use   Smoking status: Never   Smokeless tobacco: Never  Substance and Sexual Activity   Alcohol use: No   Drug use: No   Sexual activity: Not on file  Other Topics Concern   Not on file  Social History Narrative   Not on file   Social Determinants of Health   Financial Resource Strain: Not on file  Food Insecurity: Not on file  Transportation Needs: Not on file  Physical Activity: Not on file  Stress: Not on file  Social Connections: Not on file  Intimate Partner Violence: Not on file     FAMILY HISTORY:  Family History  Problem Relation Age of Onset   Seizures Mother    Memory loss Father    Diverticulosis Father    Schizophrenia Brother       REVIEW OF SYSTEMS:  Review of Systems  Unable to perform ROS: Mental acuity   VITAL SIGNS:  Temp:  [97.5 F (36.4 C)-98.6 F (37 C)] 97.6 F (36.4 C) (06/20 0446) Pulse Rate:  [97-111] 97 (06/20 0446) Resp:  [16-18] 16 (06/20 0446) BP: (104-135)/(72-90) 132/82 (06/20 0446) SpO2:  [88 %-100 %] 88 % (06/20 0446)     Height: 4\' 11"  (149.9 cm) Weight: 30.3 kg BMI (Calculated): 13.48   INTAKE/OUTPUT:  06/19 0701 - 06/20 0700 In: 540 [P.O.:540] Out: -   PHYSICAL EXAM:  Physical Exam Vitals and nursing note reviewed. Exam conducted with a chaperone present.  Constitutional:      General: She is not in acute distress.    Appearance: Normal appearance.  HENT:     Head: Normocephalic and atraumatic.     Comments: NGT in place; output appears to be gastric fluid  Pulmonary:     Effort: Pulmonary effort is normal. No respiratory distress.  Abdominal:     General: Abdomen is flat. There is no distension.     Tenderness: There is no abdominal tenderness. There is no guarding or rebound.  Genitourinary:    Comments: Deferred Musculoskeletal:     Right lower leg: No edema.     Left lower leg: No edema.  Skin:    General: Skin is warm and dry.   Neurological:     Mental Status: She is alert.     Comments: Unable to reliably assess; non-verbal  Psychiatric:     Comments: Unable to reliably assess; non-verbal     Labs:  CBC Latest Ref Rng & Units 09/24/2020 09/10/2020 09/03/2020  WBC 4.0 - 10.5 K/uL 8.6 11.0(H) 13.8(H)  Hemoglobin 12.0 - 15.0 g/dL 09/05/2020 84.1 32.4  Hematocrit 36.0 - 46.0 % 41.3 39.5 42.4  Platelets 150 - 400 K/uL 308 167 134(L)   CMP Latest Ref Rng & Units 09/24/2020 09/11/2020 09/10/2020  Glucose 70 - 99 mg/dL 11/10/2020) 027(O) 536(U)  BUN 6 - 20 mg/dL 440(H) 17 16  Creatinine 0.44 - 1.00 mg/dL 47(Q 2.59) 5.63(O)  Sodium 135 - 145  mmol/L 137 138 137  Potassium 3.5 - 5.1 mmol/L 4.2 3.9 3.9  Chloride 98 - 111 mmol/L 103 107 106  CO2 22 - 32 mmol/L 27 25 25   Calcium 8.9 - 10.3 mg/dL ) 4.2(A) 8.3(M)  Total Protein 6.5 - 8.1 g/dL 6.8 - -  Total Bilirubin 0.3 - 1.2 mg/dL 0.4 - -  Alkaline Phos 38 - 126 U/L 38 - -  AST 15 - 41 U/L 38 - -  ALT 0 - 44 U/L 58(H) - -     Imaging studies:   CT Abdomen/Pelvis (09/24/2020) personally reviewed with diffuse small bowel dilation, significant stool burden with large amount of stool in rectum, and radiologist repot reviewed:  IMPRESSION: 1. Large 10 cm diameter stool ball in the rectum with additional sigmoid stool consistent with fecal impaction. Circumferential rectal wall thickening, cannot exclude Stercoral Colitis. And the impaction may also be impeding emptying of the urinary bladder which is anteriorly displaced and flattened against the ventral pelvis.   2. Enteric tube tip is just inside the GE junction. Recommend advancing 5-6 cm to ensure side hole placement within the stomach.   3. No other acute or inflammatory process identified in the abdomen or pelvis. S-shaped scoliosis with spinal hardware fusion.    KUB (09/24/2020) personally reviewed which shows small bowel dilation and stool burden distally, and radiologist report reviewed:  IMPRESSION: 1.  Persistent extensive gaseous distension of the small and large bowel loops compatible with ileus. 2. Large desiccated stool ball within the rectum concerning for fecal impaction.   Assessment/Plan: (ICD-10's: K72.04) 45 y.o. female with nausea/emesis overnight found to have significant constipation and impaction without evidence of stercoral colitis, complicated by pertinent comorbidities including Rett syndrome, severe cognitive disability, seizure disorder nonverbal at baseline.   - She will require aggressive bowel regimen (enema, Miralax, magnesium citrate) +/- disimpaction    - NGT is reasonable for symptom control and medication administration; monitor output   - No surgical intervention - Monitor abdominal examination; on-going bowel function   - Further management per primary service; we will follow   All of the above findings and recommendations were discussed with the medical team. The patient's father was at bedside; however, he has a history of dementia.   Thank you for the opportunity to participate in this patient's care.   -- 48, PA-C [facility] 09/24/2020, 7:46 AM [phone] M-F: 7am - 4pm

## 2020-09-24 NOTE — Progress Notes (Signed)
PROGRESS NOTE  Alicia Duncan MPN:[id number] DOB: 10/05/1975   PCP: [name], MD  Patient is from: Home with home care.   Totally dependent for ADLs at baseline.  DOA: 09/03/2020 LOS: 19  Chief complaints:  Chief Complaint  Patient presents with   social work    Brief Narrative / Interim history: 45 year old F with PMH of Rett syndrome, cognitive impairment, developmental delay, nonverbal, seizure disorder and debility with complete dependence for ADLs brought to ED by EMS due to unsafe home environment.  Patient's caregiver went on vacation for several days and family at home have not been able to adequately take care of the patient.  Reportedly, patient's father who is advanced dementia.  Patient's brother with schizophrenia.  Patient is medically stable but has no safe disposition yet.  Patient's insurance and TOC working d/c.   Last note from [facility]: Angie with [employer] will come to the hospital to do an evaluation on the patient to see if she would be a good fit for her facility.  Hopefully she will be able to come out early this week.    Subjective: Vomited last night and found to have fecal impaction  Objective: Vitals:   09/23/20 1926 09/23/20 2329 09/24/20 0446 09/24/20 0909  BP: 123/87 135/90 132/82 108/60  Pulse: 100 98 97 90  Resp: 16 16 16 16   Temp: 98.6 F (37 C) 98.1 F (36.7 C) 97.6 F (36.4 C) 98.6 F (37 C)  TempSrc: Axillary Oral Oral Oral  SpO2: 93% 100% (!) 88% 90%  Weight:      Height:        Intake/Output Summary (Last 24 hours) at 09/24/2020 1106 Last data filed at 09/23/2020 1856 Gross per 24 hour  Intake 360 ml  Output --  Net 360 ml   Filed Weights   09/03/20 1613 09/03/20 2240  Weight: 40.8 kg 30.3 kg    Examination: In bed, abdomen soft     Assessment & Plan:  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 -Continue  supportive care, dysphagia 1 diet and aspiration precautions -needs safe D/C plan  Fecal impaction/ileus -aggressive bowel regimen -remove NG tube when able -can clamp for meds  Unwitnessed fall in the hospital-patient was found on her knees next to the bed on 09/14/2020.  No injuries.  No changes mental status.  Patient's brother notified.  Per her brother, patient has a tendency to wiggle her way out of bed. -Fall precautions.   History of seizure disorder: Stable. -Continue with Ativan, Trileptal, Gabitril.   Anxiety/depression: Stable but difficult to assess. -Continue with fluoxetine and ativan.   Candida Vulvovaginitis -Treated with nystatin.   Tachycardia: Resolved.   Severe Protein Malnutrition;  Body mass index is 13.49 kg/m. Nutrition Problem: Severe Malnutrition Etiology: chronic illness (Rett syndrome) Signs/Symptoms: severe muscle depletion, severe fat depletion, percent weight loss (11.6% x 6 months) Percent weight loss: 11.6 % (x 6 months) Interventions: Ensure Enlive (each supplement provides 350kcal and 20 grams of protein)   DVT prophylaxis:  enoxaparin (LOVENOX) injection 30 mg Start: 09/03/20 2200  Code Status: DNR  Level of care: Med-Surg Status is: Inpatient  Remains inpatient appropriate because:Unsafe d/c plan  Dispo: The patient is from: Home              Anticipated d/c is to:  To be determined -- group home   Difficult to place patient Yes       Consultants:  GS   Sch Meds:  Scheduled Meds:  baclofen  10 mg Oral TID   docusate  100 mg Oral BID   enoxaparin (LOVENOX) injection  30 mg Subcutaneous Q24H   feeding supplement  237 mL Oral TID BM   FLUoxetine  10 mg Oral Daily   LORazepam  1 mg Oral BID   magnesium citrate  1 Bottle Oral Once   nystatin cream   Topical BID   OXcarbazepine  600 mg Oral BID   pantoprazole (PROTONIX) IV  40 mg Intravenous Q24H   polyethylene glycol  17 g Oral Daily   sodium phosphate  1 enema Rectal  QHS   sorbitol, milk of mag, mineral oil, glycerin (SMOG) enema  300 mL Rectal Once   tiaGABine  4 mg Oral BID   Continuous Infusions:  promethazine (PHENERGAN) injection (IM or IVPB) 12.5 mg (09/23/20 2345)   PRN Meds:.acetaminophen **OR** acetaminophen, bisacodyl, ondansetron **OR** ondansetron (ZOFRAN) IV, promethazine (PHENERGAN) injection (IM or IVPB)  Antimicrobials: Anti-infectives (From admission, onward)    None        I have personally reviewed the following labs and images: CBC: Recent Labs  Lab 09/24/20 0131  WBC 8.6  NEUTROABS 5.9  HGB 13.1  HCT 41.3  MCV 92.6  PLT 308    BMP &GFR Recent Labs  Lab 09/24/20 0131  NA 137  K 4.2  CL 103  CO2 27  GLUCOSE 113*  BUN 22*  CREATININE 0.49  CALCIUM 8.8*  MG 1.8  PHOS 4.1    Estimated Creatinine Clearance: 42 mL/min (by C-G formula based on SCr of 0.49 mg/dL). Liver & Pancreas: Recent Labs  Lab 09/24/20 0131  AST 38  ALT 58*  ALKPHOS 38  BILITOT 0.4  PROT 6.8  ALBUMIN 3.4*   No results for input(s): LIPASE, AMYLASE in the last 168 hours. No results for input(s): AMMONIA in the last 168 hours. Diabetic: No results for input(s): HGBA1C in the last 72 hours. No results for input(s): GLUCAP in the last 168 hours. Cardiac Enzymes: No results for input(s): CKTOTAL, CKMB, CKMBINDEX, TROPONINI in the last 168 hours. No results for input(s): PROBNP in the last 8760 hours. Coagulation Profile: No results for input(s): INR, PROTIME in the last 168 hours. Thyroid Function Tests: No results for input(s): TSH, T4TOTAL, FREET4, T3FREE, THYROIDAB in the last 72 hours. Lipid Profile: No results for input(s): CHOL, HDL, LDLCALC, TRIG, CHOLHDL, LDLDIRECT in the last 72 hours. Anemia Panel: No results for input(s): VITAMINB12, FOLATE, FERRITIN, TIBC, IRON, RETICCTPCT in the last 72 hours. Urine analysis:    Component Value Date/Time   COLORURINE YELLOW (A) 07/16/2020 1728   APPEARANCEUR CLOUDY (A)  07/16/2020 1728   LABSPEC 1.023 07/16/2020 1728   PHURINE 8.0 07/16/2020 1728   GLUCOSEU NEGATIVE 07/16/2020 1728   HGBUR NEGATIVE 07/16/2020 1728   BILIRUBINUR NEGATIVE 07/16/2020 1728   KETONESUR 5 (A) 07/16/2020 1728   PROTEINUR NEGATIVE 07/16/2020 1728   UROBILINOGEN 1.0 04/25/2013 1427   NITRITE NEGATIVE 07/16/2020 1728   LEUKOCYTESUR NEGATIVE 07/16/2020 1728   Sepsis Labs: Invalid input(s): PROCALCITONIN, LACTICIDVEN  Microbiology: No results found for this or any previous visit (from the past 240 hour(s)).   Radiology Studies: DG Chest 1 View  Result Date: 09/24/2020 CLINICAL DATA:  Evaluate nasogastric tube placement EXAM: CHEST  1 VIEW COMPARISON:  02/14/2020 FINDINGS: The NG tube tip is approximately 6 cm below the GE junction. Heart size normal. No pleural effusion or edema. No airspace opacities. Diffuse gaseous  distension of the large and small bowel loops noted. Scoliosis rods. IMPRESSION: NG tube tip is approximately 6 cm below the GE junction. Electronically Signed   By: Signa Kell M.D.   On: 09/24/2020 03:20   DG Abd 1 View  Result Date: 09/24/2020 CLINICAL DATA:  Nausea and vomiting. EXAM: ABDOMEN - 1 VIEW COMPARISON:  02/16/2020 FINDINGS: Extensive gaseous distension of the small and large bowel loops are again noted throughout the abdomen. Large desiccated stool ball identified within the rectum measuring 10.2 x 11.2 cm concerning for fecal impaction. Scoliosis rods identified. IMPRESSION: 1. Persistent extensive gaseous distension of the small and large bowel loops compatible with ileus. 2. Large desiccated stool ball within the rectum concerning for fecal impaction. Electronically Signed   By: Signa Kell M.D.   On: 09/24/2020 01:37   CT ABDOMEN PELVIS W CONTRAST  Result Date: 09/24/2020 CLINICAL DATA:  45 year old female with recurrent nausea and vomiting. Possible fecal impaction on radiographs earlier today. EXAM: CT ABDOMEN AND PELVIS WITH CONTRAST  TECHNIQUE: Multidetector CT imaging of the abdomen and pelvis was performed using the standard protocol following bolus administration of intravenous contrast. CONTRAST:  20mL OMNIPAQUE IOHEXOL 300 MG/ML  SOLN COMPARISON:  Chest and abdominal radiographs 0107 hours. FINDINGS: Lower chest: Enteric tube coursing in the distal esophagus. No cardiomegaly. Mild lung base atelectasis or scarring more so on the left. No pericardial or pleural effusion. Hepatobiliary: Negative liver and gallbladder. Pancreas: Negative. Spleen: Negative. Adrenals/Urinary Tract: Negative, occasional small renal cysts. Bladder is displaced into the anterior pelvis and flattened. Stomach/Bowel: Large up to 10 cm diameter stool ball in the rectum (series 2, image 71). Associated mild rectal wall thickening. Superimposed retained stool in the distal sigmoid (sagittal image 49) with upstream sigmoid gas. From there there is intermittent gas and stool in the large bowel, with prominent gas-filled transverse colon. No other large bowel wall thickening. Redundant colon. Tip of the cecum is probably in the midline. Appendix not clearly delineated. Gas-filled but no abnormally dilated small bowel. The enteric tube tip is just inside the GE J. mild gas and fluid in the stomach. Duodenum appears decompressed. No free air or free fluid identified. Vascular/Lymphatic: Major arterial structures in the abdomen and pelvis appear to be patent and normal. Portal venous system appears patent. No lymphadenopathy. Reproductive: Displaced from the rectosigmoid colon but otherwise negative. Other: No pelvic free fluid. Musculoskeletal: S shaped scoliosis with posterior spinal rods and diffuse thoracolumbar posterior element fusion. Bilateral iliac screws. Hardware artifact. Degenerative changes at the pubic symphysis. No acute osseous abnormality identified. IMPRESSION: 1. Large 10 cm diameter stool ball in the rectum with additional sigmoid stool consistent with  fecal impaction. Circumferential rectal wall thickening, cannot exclude Stercoral Colitis. And the impaction may also be impeding emptying of the urinary bladder which is anteriorly displaced and flattened against the ventral pelvis. 2. Enteric tube tip is just inside the GE junction. Recommend advancing 5-6 cm to ensure side hole placement within the stomach. 3. No other acute or inflammatory process identified in the abdomen or pelvis. S-shaped scoliosis with spinal hardware fusion. Electronically Signed   By: Odessa Fleming M.D.   On: 09/24/2020 09:08      Marlin Canary DO [facility]  If 7PM-7AM, please contact night-coverage [email or web address] 09/24/2020, 11:06 AM

## 2020-09-24 NOTE — Progress Notes (Signed)
Cross Cover Patient with development of facial expression/body manerisms and repeated vomiting episodes.  KUB revealed ileus and rectal stool ball CBC and CMP normal ranges NGT to LIWS Discussed with Dr Everlene Farrier with general surgery CT abdomen pelvis ordered

## 2020-09-24 NOTE — Progress Notes (Signed)
Patient alert, noted patient anxious with some rapid deep breaths, 0.25mg  of  ativan IV ordered per hospitalist. Patient also nausea and vomiting this shift, brown emesis noted, PRN Zofran IV and phenergan given, hospitalist notified, KUB ordered with labs. NG tube placed per hospitalist without difficult, patient tolerated well. Will continue to monitor.

## 2020-09-24 NOTE — Progress Notes (Signed)
Patient seen and examined.  I agree with Mr. Alicia Duncan.  Ileus and fecal impaction in need for aggressive bowel regimen and manual disimpaction along with fleets enema.  Abdomen is benign and there is no acute surgical needs.  We will be available.

## 2020-09-25 ENCOUNTER — Inpatient Hospital Stay: Payer: Medicare Other

## 2020-09-25 MED ORDER — SORBITOL 70 % SOLN
300.0000 mL | TOPICAL_OIL | Freq: Once | ORAL | Status: AC
  Administered 2020-09-25: 300 mL via RECTAL
  Filled 2020-09-25: qty 90

## 2020-09-25 NOTE — Progress Notes (Signed)
Pt given fleets enema as ordered. Small liquid bowel movement following enema. Before enema given pt had large liquid BM.

## 2020-09-25 NOTE — Progress Notes (Addendum)
PROGRESS NOTE  Alicia Duncan YIR:[id number] DOB: May 14, 1975   PCP: [name], MD  Patient is from: Home with home care.   Totally dependent for ADLs at baseline.  DOA: 09/03/2020 LOS: 20  Chief complaints:  Chief Complaint  Patient presents with   social work    Brief Narrative / Interim history: 45 year old F with PMH of Rett syndrome, cognitive impairment, developmental delay, nonverbal, seizure disorder and debility with complete dependence for ADLs brought to ED by EMS due to unsafe home environment.  Patient's caregiver went on vacation for several days and family at home have not been able to adequately take care of the patient.  Reportedly, patient's father who is advanced dementia.  Patient's brother with schizophrenia.  Patient is medically stable but has no safe disposition yet.  Patient's insurance and TOC working d/c.   Last note from [facility]: Angie with [facility] will come to the hospital to do an evaluation on the patient to see if she would be a good fit for her facility.  Hopefully she will be able to come out early this week.   Recent complication of severe constipation   Subjective: Still with fecal ball in rectum-- nurse reports lots of episodes of stool yesterday  Objective: Vitals:   09/25/20 0004 09/25/20 0111 09/25/20 0423 09/25/20 0802  BP: 115/80 93/72 104/77 105/73  Pulse: (!) 128 98 (!) 102   Resp: 18  16 16   Temp: 98.4 F (36.9 C)  (!) 97.4 F (36.3 C)   TempSrc: Oral  Oral   SpO2: (!) 85% 97% 97% 96%  Weight:      Height:        Intake/Output Summary (Last 24 hours) at 09/25/2020 1121 Last data filed at 09/24/2020 1700 Gross per 24 hour  Intake 50 ml  Output --  Net 50 ml   Filed Weights   09/03/20 1613 09/03/20 2240  Weight: 40.8 kg 30.3 kg    Examination: In bed, NAD  Assessment & Plan:  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 -Continue supportive care, dysphagia 1 diet and aspiration precautions -needs safe D/C plan  Fecal impaction/ileus -aggressive bowel regimen -patient removed tube on own yesterday -manual disimpaction ordered  Unwitnessed fall in the hospital-patient was found on her knees next to the bed on 09/14/2020.  No injuries.  No changes mental status.  Patient's brother notified.  Per her brother, patient has a tendency to wiggle her way out of bed. -Fall precautions.   History of seizure disorder: Stable. -Continue with Ativan, Trileptal, Gabitril.   Anxiety/depression: Stable but difficult to assess. -Continue with fluoxetine and ativan.   Candida Vulvovaginitis -Treated with nystatin.   Tachycardia: Resolved.   Severe Protein Malnutrition;  Body mass index is 13.49 kg/m. Nutrition Problem: Severe Malnutrition Etiology: chronic illness (Rett syndrome) Signs/Symptoms: severe muscle depletion, severe fat depletion, percent weight loss (11.6% x 6 months) Percent weight loss: 11.6 % (x 6 months) Interventions: Ensure Enlive (each supplement provides 350kcal and 20 grams of protein)   DVT prophylaxis:  enoxaparin (LOVENOX) injection 30 mg Start: 09/03/20 2200  Code Status: DNR  Level of care: Med-Surg Status is: Inpatient  Remains inpatient appropriate because:Unsafe d/c plan  Dispo: The patient is from: Home              Anticipated d/c is to:  To be determined -- group home   Difficult to place patient Yes  Consultants:  GS   Sch Meds:  Scheduled Meds:  baclofen  10 mg Per Tube TID   docusate  100 mg Per Tube BID   enoxaparin (LOVENOX) injection  30 mg Subcutaneous Q24H   feeding supplement  237 mL Per Tube TID BM   FLUoxetine  10 mg Per Tube Daily   LORazepam  1 mg Per Tube BID   magnesium citrate  1 Bottle Oral Once   nystatin cream   Topical BID   OXcarbazepine  600 mg Per Tube BID   pantoprazole (PROTONIX) IV  40 mg  Intravenous Q24H   polyethylene glycol  17 g Per Tube BID   sodium phosphate  1 enema Rectal QHS   sorbitol, milk of mag, mineral oil, glycerin (SMOG) enema  300 mL Rectal Once   tiaGABine  4 mg Per Tube BID   Continuous Infusions:  promethazine (PHENERGAN) injection (IM or IVPB) 12.5 mg (09/23/20 2345)   PRN Meds:.acetaminophen **OR** acetaminophen, bisacodyl, ondansetron **OR** ondansetron (ZOFRAN) IV, promethazine (PHENERGAN) injection (IM or IVPB)  Antimicrobials: Anti-infectives (From admission, onward)    None        I have personally reviewed the following labs and images: CBC: Recent Labs  Lab 09/24/20 0131  WBC 8.6  NEUTROABS 5.9  HGB 13.1  HCT 41.3  MCV 92.6  PLT 308    BMP &GFR Recent Labs  Lab 09/24/20 0131  NA 137  K 4.2  CL 103  CO2 27  GLUCOSE 113*  BUN 22*  CREATININE 0.49  CALCIUM 8.8*  MG 1.8  PHOS 4.1    Estimated Creatinine Clearance: 42 mL/min (by C-G formula based on SCr of 0.49 mg/dL). Liver & Pancreas: Recent Labs  Lab 09/24/20 0131  AST 38  ALT 58*  ALKPHOS 38  BILITOT 0.4  PROT 6.8  ALBUMIN 3.4*   No results for input(s): LIPASE, AMYLASE in the last 168 hours. No results for input(s): AMMONIA in the last 168 hours. Diabetic: No results for input(s): HGBA1C in the last 72 hours. No results for input(s): GLUCAP in the last 168 hours. Cardiac Enzymes: No results for input(s): CKTOTAL, CKMB, CKMBINDEX, TROPONINI in the last 168 hours. No results for input(s): PROBNP in the last 8760 hours. Coagulation Profile: No results for input(s): INR, PROTIME in the last 168 hours. Thyroid Function Tests: No results for input(s): TSH, T4TOTAL, FREET4, T3FREE, THYROIDAB in the last 72 hours. Lipid Profile: No results for input(s): CHOL, HDL, LDLCALC, TRIG, CHOLHDL, LDLDIRECT in the last 72 hours. Anemia Panel: No results for input(s): VITAMINB12, FOLATE, FERRITIN, TIBC, IRON, RETICCTPCT in the last 72 hours. Urine analysis:     Component Value Date/Time   COLORURINE YELLOW (A) 07/16/2020 1728   APPEARANCEUR CLOUDY (A) 07/16/2020 1728   LABSPEC 1.023 07/16/2020 1728   PHURINE 8.0 07/16/2020 1728   GLUCOSEU NEGATIVE 07/16/2020 1728   HGBUR NEGATIVE 07/16/2020 1728   BILIRUBINUR NEGATIVE 07/16/2020 1728   KETONESUR 5 (A) 07/16/2020 1728   PROTEINUR NEGATIVE 07/16/2020 1728   UROBILINOGEN 1.0 04/25/2013 1427   NITRITE NEGATIVE 07/16/2020 1728   LEUKOCYTESUR NEGATIVE 07/16/2020 1728   Sepsis Labs: Invalid input(s): PROCALCITONIN, LACTICIDVEN  Microbiology: No results found for this or any previous visit (from the past 240 hour(s)).   Radiology Studies: DG Abd Portable 1V  Result Date: 09/25/2020 CLINICAL DATA:  Constipation. EXAM: PORTABLE ABDOMEN - 1 VIEW COMPARISON:  CT 09/24/2020.  Abdomen 09/24/2020. FINDINGS: A large stool ball again noted within the rectum consistent with fecal impaction.  No prominent bowel distention noted on today's exam. No free air noted. Thoracolumbar scoliosis again noted. Thoracolumbar and lumbosacral fusion again noted. IMPRESSION: A large stool ball again noted the rectum consistent with fecal impaction. No interim change. No prominent bowel distention on today's exam. Electronically Signed   By: Maisie Fus  Register   On: 09/25/2020 10:03      Marlin Canary DO [facility]  If 7PM-7AM, please contact night-coverage [email or web address] 09/25/2020, 11:21 AM

## 2020-09-25 NOTE — Progress Notes (Signed)
Nutrition Follow-up  DOCUMENTATION CODES:  Severe malnutrition in context of chronic illness, Underweight  INTERVENTION:  After disimpacted, advance diet to previous as ordered by SLP Nursing staff to assist pt with meals (set-up /feed) when diet reinstated  Pending diet advancement, Ensure Enlive po TID, each supplement provides 350 kcal and 20 grams of protein Pending diet advancement, Magic cup TID with meals, each supplement provides 290 kcal and 9 grams of protein Request new measured weight  NUTRITION DIAGNOSIS:  Severe Malnutrition related to chronic illness (Rett syndrome) as evidenced by severe muscle depletion, severe fat depletion, percent weight loss (11.6% x 6 months).  GOAL:  Patient will meet greater than or equal to 90% of their needs, Weight gain  MONITOR:  PO intake, Supplement acceptance, Labs, Weight trends  REASON FOR ASSESSMENT:  Other (Comment), Malnutrition Screening Tool (Low BMI, 13.48)    ASSESSMENT:  Pt brought to ED after concerns were raised over her care at home. Caregiver has been on vacation for several days and family at home have not been able to adequately take care of the patient. 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   Noted that since last assessment, pt developed vomiting. Imaging 6/20 showed large stool burden and possible ileus. Pt was made NPO 5/20, and NGT was inserted and placed to suction. NGT able to be removed yesterday. SMOG enema given, yesterday which resulted in a large amount of stool being expelled.  Discussed in IDT rounds, imaging this AM shows a stool ball remains in rectum. MD plans to advance diet after disimpaction today.  Pt resting in bed at the time of visit, awake but non-verbal at baseline. Pt was low in bed, could not obtain a bed weight. Will request RN obtain and record weight weekly. Intake  over the last week poor but stable.  Pt remains inpatient at this time awaiting placement. MD reports medically stable for discharge once facility placement is secured.  Average Meal Intake: 5/30-5/31: 50% intake x 1 recorded meal 6/1-6/7: 38% intake x 2 recorded meals 6/8-6/14: 19% intake x 8 recorded meals 6/15-6/19: 36% intake x 7 recorded meals  Nutritionally Relevant Medications Scheduled Meds:  baclofen  10 mg Per Tube TID   docusate  100 mg Per Tube BID   feeding supplement  237 mL Per Tube TID BM   magnesium citrate  1 Bottle Oral Once   pantoprazole (PROTONIX) IV  40 mg Intravenous Q24H   polyethylene glycol  17 g Per Tube BID   sodium phosphate  1 enema Rectal QHS   Continuous Infusions:  promethazine injection (IM or IVPB) 12.5 mg (09/23/20 2345)   PRN Meds: bisacodyl, ondansetron, promethazine  Labs reviewed  NUTRITION - FOCUSED PHYSICAL EXAM: Flowsheet Row Most Recent Value  Orbital Region Mild depletion  Upper Arm Region Severe depletion  Thoracic and Lumbar Region Severe depletion  Buccal Region Mild depletion  Temple Region Mild depletion  Clavicle Bone Region Mild depletion  Clavicle and Acromion Bone Region Severe depletion  Scapular Bone Region Severe depletion  Dorsal Hand Mild depletion  Patellar Region Severe depletion  Anterior Thigh Region Severe depletion  Posterior Calf Region Severe depletion  Edema (RD Assessment) None  Hair Reviewed  Eyes Unable to assess  Mouth Unable to assess  Skin Reviewed  Nails Reviewed   Diet Order:   Diet Order  Diet NPO time specified  Diet effective now           Diet - low sodium heart healthy                  EDUCATION NEEDS:  Not appropriate for education at this time  Skin:  Skin Assessment: Reviewed RN Assessment  Last BM:  6/21 - type 7  Height:  Ht Readings from Last 1 Encounters:  09/03/20 4\' 11"  (1.499 m)   Weight:  Wt Readings from Last 1 Encounters:  09/03/20 30.3  kg   Ideal Body Weight:  44.3 kg  BMI:  Body mass index is 13.49 kg/m.  Estimated Nutritional Needs:  Kcal:  1400-1600 kcal/d Protein:  70-85 g/d Fluid:  >1500 mL/d  09/05/20, RD, LDN Clinical Dietitian Pager on Amion

## 2020-09-25 NOTE — Progress Notes (Signed)
Pt given SMOG enema @ approx 1300 this shift. Small amount of stool noted in bed after enema given. At approx 1640 pt still had not had a BM. Per order I tried to digitally disimpacted pt. Stool could only been felt at the distal tip of my finger. Could not remove but a small amount. MD made aware. Order given to give mag citrate that ws previously ordered, but placed on hold. Magnesium citrate given at 1820 in bottle for pt.

## 2020-09-26 ENCOUNTER — Inpatient Hospital Stay: Payer: Medicare Other

## 2020-09-26 DIAGNOSIS — K5641 Fecal impaction: Secondary | ICD-10-CM

## 2020-09-26 DIAGNOSIS — F32A Depression, unspecified: Secondary | ICD-10-CM

## 2020-09-26 DIAGNOSIS — B3731 Acute candidiasis of vulva and vagina: Secondary | ICD-10-CM

## 2020-09-26 DIAGNOSIS — B373 Candidiasis of vulva and vagina: Secondary | ICD-10-CM

## 2020-09-26 DIAGNOSIS — K5900 Constipation, unspecified: Secondary | ICD-10-CM

## 2020-09-26 MED ORDER — BISACODYL 10 MG RE SUPP
10.0000 mg | Freq: Every day | RECTAL | Status: DC
  Filled 2020-09-26: qty 1

## 2020-09-26 MED ORDER — BISACODYL 10 MG RE SUPP
10.0000 mg | Freq: Once | RECTAL | Status: DC
  Filled 2020-09-26: qty 1

## 2020-09-26 NOTE — TOC Progression Note (Signed)
Transition of Care [facility] - Progression Note    Patient Details  Name: Alicia Duncan MRN: [id number] Date of Birth: 04/25/1975  [facility] CM/SW Contact  Allayne Butcher, RN Phone Number: 09/26/2020, 2:57 PM  Clinical Narrative:    Family care home did come to evaluate patient and they seem interested in offering a bed.  RNCM will wait to hear back from care home.     Expected Discharge Plan: Group Home Barriers to Discharge: Other (must enter comment) (patient needs a specialized care home placement)  Expected Discharge Plan and Services Expected Discharge Plan: Group Home   Discharge Planning Services: CM Consult Post Acute Care Choice: Nursing Home Living arrangements for the past 2 months: Single Family Home Expected Discharge Date: 09/13/20               DME Arranged: N/A DME Agency: NA         HH Agency: NA         Social Determinants of Health (SDOH) Interventions    Readmission Risk Interventions No flowsheet data found.

## 2020-09-26 NOTE — Progress Notes (Signed)
PROGRESS NOTE    Alicia Duncan  XYI:[id number] DOB: May 10, 1975 DOA: 09/03/2020 PCP: [name], MD    Chief Complaint  Patient presents with   social work    Brief Narrative:  45 year old F with PMH of Rett syndrome, cognitive impairment, developmental delay, nonverbal, seizure disorder and debility with complete dependence for ADLs brought to ED by EMS due to unsafe home environment.  Patient's caregiver went on vacation for several days and family at home have not been able to adequately take care of the patient.  Reportedly, patient's father who is advanced dementia.  Patient's brother with schizophrenia.  Patient is medically stable but has no safe disposition yet.  Patient's insurance and TOC working d/c.   Last note from [facility]: Angie with [facility] will come to the hospital to do an evaluation on the patient to see if she would be a good fit for her facility.  Hopefully she will be able to come out early this week.   Recent complication of severe constipation     Assessment & Plan:   Principal Problem:   Failure to thrive in adult Active Problems:   Rett syndrome   Seizure disorder (HCC)   Severe protein-calorie malnutrition (HCC)   Constipation   Vaginal candidiasis   Fecal impaction (HCC)   Depression   1 failure to thrive/Rett syndrome with cognitive disability/developmental delay/nonverbal with oral phase dysphagia at baseline-requires total care -Resume home regimen dysphagia 1 diet with aspiration precautions. -Supportive care. -Needs safe discharge plan.  2.  Fecal impaction/ileus -Patient had a NG tube placed which patient removed. -Manual disimpaction ordered however per RN note unable to touch stool. -Patient with multiple loose watery stools. -Continue current aggressive bowel regimen of Colace twice daily, MiraLAX twice daily, fleets enema nightly.  We will also add Dulcolax suppositories. -Patient seen in consultation by general surgery  who feel no surgical intervention at this time I recommended aggressive bowel regimen.  3.  Unwitnessed fall in the hospital -Noted to have been found on her knees next of the bed on 09/14/2020 with no injuries noted -No mental status changes noted. -It is noted that patient's brother was notified, and noted that brother stated patient with tendency to wiggle her way out of bed. -Fall precautions.  4.  History of seizure disorder -Stable. -Continue Ativan, Trileptal, Gabitril.  5.  Depression/anxiety -Fluoxetine, Ativan.  6.  Candida vulvovaginitis -Treated with nystatin.  7.  Tachycardia -Resolved.  8.  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 -Patient with severe muscle depletion, severe fat depletion, percent weight loss 11.6% x 6 months. -Continue nutritional supplementation with Ensure. -Resume dysphagia 1 diet. -Dietitian following.   DVT prophylaxis: Lovenox Code Status: DNR Family Communication: No family at bedside. Disposition:   Status is: Inpatient  Remains inpatient appropriate because:Unsafe d/c plan  Dispo: The patient is from: Home              Anticipated d/c is to:  TBD              Patient currently is medically stable to d/c.   Difficult to place patient Yes       Consultants:  General surgery: Dr. Everlene Farrier 09/24/2020  Procedures:  CT abdomen pelvis 09/24/2020 Abdominal films 09/24/2020, 09/25/2020, 09/26/2020 Chest x-ray 09/24/2020   Antimicrobials:  Anti-infectives (From admission, onward)    None         Subjective: Had BM getting cleaned up. Non verbal.  Objective: Vitals:  09/26/20 0438 09/26/20 0719 09/26/20 1220 09/26/20 1534  BP: 103/80 117/79 (!) 135/97 133/77  Pulse: 98 96 99 74  Resp: 20 14 16 18   Temp: (!) 97.5 F (36.4 C) 97.6 F (36.4 C) 97.8 F (36.6 C) 97.9 F (36.6 C)  TempSrc:      SpO2: 97% 97% 100% 98%  Weight:      Height:       No intake or output data in the 24 hours  ending 09/26/20 1810 Filed Weights   09/03/20 1613 09/03/20 2240  Weight: 40.8 kg 30.3 kg    Examination:  General exam: Appears calm and comfortable  Respiratory system: Clear to auscultation anterior lung fields. Respiratory effort normal. Cardiovascular system: S1 & S2 heard, RRR. No JVD, murmurs, rubs, gallops or clicks. No pedal edema. Gastrointestinal system: Abdomen is nondistended, soft and nontender. No organomegaly or masses felt. Normal bowel sounds heard. Central nervous system: Alert. No focal neurological deficits. Extremities: Symmetric 5 x 5 power. Skin: No rashes, lesions or ulcers Psychiatry: Judgement and insight appear poor. Mood & affect appropriate.     Data Reviewed: I have personally reviewed following labs and imaging studies  CBC: Recent Labs  Lab 09/24/20 0131  WBC 8.6  NEUTROABS 5.9  HGB 13.1  HCT 41.3  MCV 92.6  PLT 308    Basic Metabolic Panel: Recent Labs  Lab 09/24/20 0131  NA 137  K 4.2  CL 103  CO2 27  GLUCOSE 113*  BUN 22*  CREATININE 0.49  CALCIUM 8.8*  MG 1.8  PHOS 4.1    GFR: Estimated Creatinine Clearance: 42 mL/min (by C-G formula based on SCr of 0.49 mg/dL).  Liver Function Tests: Recent Labs  Lab 09/24/20 0131  AST 38  ALT 58*  ALKPHOS 38  BILITOT 0.4  PROT 6.8  ALBUMIN 3.4*    CBG: No results for input(s): GLUCAP in the last 168 hours.   No results found for this or any previous visit (from the past 240 hour(s)).       Radiology Studies: DG Abd Portable 1V  Result Date: 09/26/2020 CLINICAL DATA:  Constipation EXAM: PORTABLE ABDOMEN - 1 VIEW COMPARISON:  09/25/2020 FINDINGS: Decreased rectal stool burden. Air-filled loops of large and small bowel throughout abdomen, nondistended. No bowel wall thickening or obstruction. Osseous demineralization with thoracolumbar scoliosis and prior spinal fixation. IMPRESSION: Decreased rectal stool burden. Electronically Signed   By: 09/27/2020 M.D.   On:  09/26/2020 10:19   DG Abd Portable 1V  Result Date: 09/25/2020 CLINICAL DATA:  Constipation. EXAM: PORTABLE ABDOMEN - 1 VIEW COMPARISON:  CT 09/24/2020.  Abdomen 09/24/2020. FINDINGS: A large stool ball again noted within the rectum consistent with fecal impaction. No prominent bowel distention noted on today's exam. No free air noted. Thoracolumbar scoliosis again noted. Thoracolumbar and lumbosacral fusion again noted. IMPRESSION: A large stool ball again noted the rectum consistent with fecal impaction. No interim change. No prominent bowel distention on today's exam. Electronically Signed   By: 09/26/2020  Register   On: 09/25/2020 10:03        Scheduled Meds:  baclofen  10 mg Per Tube TID   bisacodyl  10 mg Rectal Once   docusate  100 mg Per Tube BID   enoxaparin (LOVENOX) injection  30 mg Subcutaneous Q24H   feeding supplement  237 mL Per Tube TID BM   FLUoxetine  10 mg Per Tube Daily   LORazepam  1 mg Per Tube BID   nystatin cream  Topical BID   OXcarbazepine  600 mg Per Tube BID   pantoprazole (PROTONIX) IV  40 mg Intravenous Q24H   polyethylene glycol  17 g Per Tube BID   sodium phosphate  1 enema Rectal QHS   tiaGABine  4 mg Per Tube BID   Continuous Infusions:  promethazine (PHENERGAN) injection (IM or IVPB) 12.5 mg (09/23/20 2345)     LOS: 21 days    Time spent: 35 minutes    Ramiro Harvest, MD [facility]   To contact the attending provider between 7A-7P or the covering provider during after hours 7P-7A, please log into the web site [email or web address] and access using universal [place] password for that web site. If you do not have the password, please call the hospital operator.  09/26/2020, 6:10 PM  Dr. Roanna Banning

## 2020-09-26 NOTE — TOC Progression Note (Signed)
Transition of Care [facility]) - Progression Note    Patient Details  Name: Alicia Duncan MRN: [id number] Date of Birth: Sep 04, 1975  Transition of Care [facility]) CM/SW Contact  Allayne Butcher, RN Phone Number: 09/26/2020, 9:03 AM  Clinical Narrative:    RNCM spoke with [name] from [facility].  First person who came to assess patient for their home said that her care exceeded their ability.  [name] has another care home coming today to evaluate patient and she has also reached out to [employer].     Expected Discharge Plan: Group Home Barriers to Discharge: Other (must enter comment) (patient needs a specialized care home placement)  Expected Discharge Plan and Services Expected Discharge Plan: Group Home   Discharge Planning Services: CM Consult Post Acute Care Choice: Nursing Home Living arrangements for the past 2 months: Single Family Home Expected Discharge Date: 09/13/20               DME Arranged: N/A DME Agency: NA         HH Agency: NA         Social Determinants of Health (SDOH) Interventions    Readmission Risk Interventions No flowsheet data found.

## 2020-09-27 LAB — BASIC METABOLIC PANEL
Anion gap: 11 (ref 5–15)
Anion gap: 8 (ref 5–15)
BUN: 30 mg/dL — ABNORMAL HIGH (ref 6–20)
BUN: 27 mg/dL — ABNORMAL HIGH (ref 6–20)
CO2: 34 mmol/L — ABNORMAL HIGH (ref 22–32)
CO2: 29 mmol/L (ref 22–32)
Calcium: 9.8 mg/dL (ref 8.9–10.3)
Calcium: 9.2 mg/dL (ref 8.9–10.3)
Chloride: 109 mmol/L (ref 98–111)
Chloride: 109 mmol/L (ref 98–111)
Creatinine, Ser: 0.53 mg/dL (ref 0.44–1.00)
Creatinine, Ser: 0.45 mg/dL (ref 0.44–1.00)
GFR, Estimated: >60 mL/min (ref >60)
GFR, Estimated: >60 mL/min (ref >60)
Glucose, Bld: 100 mg/dL — ABNORMAL HIGH (ref 70–99)
Glucose, Bld: 183 mg/dL — ABNORMAL HIGH (ref 70–99)
Potassium: 3.8 mmol/L (ref 3.5–5.1)
Potassium: 3.2 mmol/L — ABNORMAL LOW (ref 3.5–5.1)
Sodium: 154 mmol/L — ABNORMAL HIGH (ref 135–145)
Sodium: 146 mmol/L — ABNORMAL HIGH (ref 135–145)

## 2020-09-27 LAB — CBC WITH DIFFERENTIAL/PLATELET
Abs Immature Granulocytes: 0.02 10*3/uL (ref 0.00–0.07)
Basophils Absolute: 0.1 10*3/uL (ref 0.0–0.1)
Basophils Relative: 1 %
Eosinophils Absolute: 0.1 10*3/uL (ref 0.0–0.5)
Eosinophils Relative: 1 %
HCT: 44.1 % (ref 36.0–46.0)
Hemoglobin: 13.6 g/dL (ref 12.0–15.0)
Immature Granulocytes: 0 %
Lymphocytes Relative: 18 %
Lymphs Abs: 1.3 10*3/uL (ref 0.7–4.0)
MCH: 29.6 pg (ref 26.0–34.0)
MCHC: 30.8 g/dL (ref 30.0–36.0)
MCV: 95.9 fL (ref 80.0–100.0)
Monocytes Absolute: 1 10*3/uL (ref 0.1–1.0)
Monocytes Relative: 13 %
Neutro Abs: 5 10*3/uL (ref 1.7–7.7)
Neutrophils Relative %: 67 %
Platelets: 282 10*3/uL (ref 150–400)
RBC: 4.6 MIL/uL (ref 3.87–5.11)
RDW: 16 % — ABNORMAL HIGH (ref 11.5–15.5)
WBC: 7.4 10*3/uL (ref 4.0–10.5)
nRBC: 0 % (ref 0.0–0.2)

## 2020-09-27 LAB — CREATININE, URINE, RANDOM: Creatinine, Urine: 97 mg/dL

## 2020-09-27 LAB — SODIUM, URINE, RANDOM: Sodium, Ur: 37 mmol/L

## 2020-09-27 LAB — MAGNESIUM: Magnesium: 2.6 mg/dL — ABNORMAL HIGH (ref 1.7–2.4)

## 2020-09-27 MED ORDER — POTASSIUM CHLORIDE 20 MEQ PO PACK
40.0000 meq | PACK | Freq: Once | ORAL | Status: AC
  Administered 2020-09-27: 18:00:00 40 meq via ORAL
  Filled 2020-09-27: qty 2

## 2020-09-27 MED ORDER — DEXTROSE 5 % IV SOLN: INTRAVENOUS | Status: DC

## 2020-09-27 NOTE — Progress Notes (Signed)
PROGRESS NOTE    Alicia Duncan  BSJ:[id number] DOB: 01-06-1976 DOA: 09/03/2020 PCP: [name], MD    Chief Complaint  Patient presents with   social work    Brief Narrative:  45 year old F with PMH of Rett syndrome, cognitive impairment, developmental delay, nonverbal, seizure disorder and debility with complete dependence for ADLs brought to ED by EMS due to unsafe home environment.  Patient's caregiver went on vacation for several days and family at home have not been able to adequately take care of the patient.  Reportedly, patient's father who is advanced dementia.  Patient's brother with schizophrenia.  Patient is medically stable but has no safe disposition yet.  Patient's insurance and TOC working d/c.   Last note from [facility]: Angie with [employer] [facility] will come to the hospital to do an evaluation on the patient to see if she would be a good fit for her facility.  Hopefully she will be able to come out early this week.   Recent complication of severe constipation     Assessment & Plan:   Principal Problem:   Failure to thrive in adult Active Problems:   Rett syndrome   Seizure disorder (HCC)   Severe protein-calorie malnutrition (HCC)   Constipation   Vaginal candidiasis   Fecal impaction (HCC)   Depression   1 failure to thrive/Rett syndrome with cognitive disability/developmental delay/nonverbal with oral phase dysphagia at baseline-requires total care -Continue current dysphagia 1 diet with aspiration precautions.  -Supportive care.   -Needs a safe discharge plan which TOC is working on.  2.  Fecal impaction/ileus -Patient had a NG tube placed which patient removed. -Manual disimpaction ordered however per RN note unable to touch stool. -Patient with multiple loose watery stools over the past 2 days. -Continue current aggressive bowel regimen of Colace twice daily, MiraLAX twice daily, fleets enema nightly.  Dulcolax suppository as needed. -Patient seen  in consultation by general surgery who feel no surgical intervention at this time and recommending aggressive bowel regimen.   3.  Unwitnessed fall in the hospital -Noted to have been found on her knees next of the bed on 09/14/2020 with no injuries noted -No mental status changes noted. -It is noted that patient's brother was notified, and noted that brother stated patient with tendency to wiggle her way out of bed. -Fall precautions.  4.  History of seizure disorder -Stable. -Continue Trileptal, Ativan, Gabitril.    5.  Depression/anxiety -Continue fluoxetine, Ativan.    6.  Candida vulvovaginitis -Status post nystatin.  7.  Tachycardia -Resolved.  8.  Hypernatremia -Likely secondary to dehydration secondary to multiple bowel movements and poor oral intake. -Check urine sodium, urine creatinine. -Place on D5W. -Repeat labs this afternoon.  9.  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 -Patient with severe muscle depletion, severe fat depletion, percent weight loss 11.6% x 6 months. -Continue nutritional supplementation with Ensure. -Continue dysphagia 1 diet.   -Dietitian following.     DVT prophylaxis: Lovenox Code Status: DNR Family Communication: No family at bedside. Disposition:   Status is: Inpatient  Remains inpatient appropriate because:Unsafe d/c plan  Dispo: The patient is from: Home              Anticipated d/c is to:  TBD              Patient currently is medically stable to d/c.   Difficult to place patient Yes       Consultants:  General surgery: Dr.  Pabon 09/24/2020  Procedures:  CT abdomen pelvis 09/24/2020 Abdominal films 09/24/2020, 09/25/2020, 09/26/2020 Chest x-ray 09/24/2020   Antimicrobials:  Anti-infectives (From admission, onward)    None         Subjective: Laying in bed.  Opens eyes to verbal stimuli.  Nonverbal.  No BM yet today per NT.  Objective: Vitals:   09/26/20 2114 09/26/20 2220  09/27/20 0423 09/27/20 0747  BP: 137/81  136/79 121/73  Pulse: 79  75 78  Resp: 18  20 18   Temp: 98 F (36.7 C)   98 F (36.7 C)  TempSrc: Axillary   Axillary  SpO2:  97%  97%  Weight:      Height:        Intake/Output Summary (Last 24 hours) at 09/27/2020 1103 Last data filed at 09/27/2020 0919 Gross per 24 hour  Intake 109.68 ml  Output --  Net 109.68 ml   Filed Weights   09/03/20 1613 09/03/20 2240  Weight: 40.8 kg 30.3 kg    Examination:  General exam: : NAD Respiratory system: CTA B anterior lung fields.  No wheezes, no rhonchi.  Normal respiratory effort. Cardiovascular system: Regular rate and rhythm no murmurs rubs or gallops.  No JVD.  No lower extremity edema.  Gastrointestinal system: Abdomen soft, nontender, nondistended, positive bowel sounds.  No rebound.  No guarding. Central nervous system: Alert. No focal neurological deficits. Extremities: Symmetric 5 x 5 power. Skin: No rashes, lesions or ulcers Psychiatry: Judgement and insight appear poor. Mood & affect appropriate.   Data Reviewed: I have personally reviewed following labs and imaging studies  CBC: Recent Labs  Lab 09/24/20 0131 09/27/20 0414  WBC 8.6 7.4  NEUTROABS 5.9 5.0  HGB 13.1 13.6  HCT 41.3 44.1  MCV 92.6 95.9  PLT 308 282     Basic Metabolic Panel: Recent Labs  Lab 09/24/20 0131 09/27/20 0414  NA 137 154*  K 4.2 3.8  CL 103 109  CO2 27 34*  GLUCOSE 113* 100*  BUN 22* 30*  CREATININE 0.49 0.53  CALCIUM 8.8* 9.8  MG 1.8 2.6*  PHOS 4.1  --      GFR: Estimated Creatinine Clearance: 42 mL/min (by C-G formula based on SCr of 0.53 mg/dL).  Liver Function Tests: Recent Labs  Lab 09/24/20 0131  AST 38  ALT 58*  ALKPHOS 38  BILITOT 0.4  PROT 6.8  ALBUMIN 3.4*     CBG: No results for input(s): GLUCAP in the last 168 hours.   No results found for this or any previous visit (from the past 240 hour(s)).       Radiology Studies: DG Abd Portable  1V  Result Date: 09/26/2020 CLINICAL DATA:  Constipation EXAM: PORTABLE ABDOMEN - 1 VIEW COMPARISON:  09/25/2020 FINDINGS: Decreased rectal stool burden. Air-filled loops of large and small bowel throughout abdomen, nondistended. No bowel wall thickening or obstruction. Osseous demineralization with thoracolumbar scoliosis and prior spinal fixation. IMPRESSION: Decreased rectal stool burden. Electronically Signed   By: 09/27/2020 M.D.   On: 09/26/2020 10:19        Scheduled Meds:  baclofen  10 mg Per Tube TID   bisacodyl  10 mg Rectal Once   docusate  100 mg Per Tube BID   enoxaparin (LOVENOX) injection  30 mg Subcutaneous Q24H   feeding supplement  237 mL Per Tube TID BM   FLUoxetine  10 mg Per Tube Daily   LORazepam  1 mg Per Tube BID   nystatin cream  Topical BID   OXcarbazepine  600 mg Per Tube BID   pantoprazole (PROTONIX) IV  40 mg Intravenous Q24H   polyethylene glycol  17 g Per Tube BID   sodium phosphate  1 enema Rectal QHS   tiaGABine  4 mg Per Tube BID   Continuous Infusions:  dextrose 100 mL/hr at 09/27/20 0919   promethazine (PHENERGAN) injection (IM or IVPB) 12.5 mg (09/23/20 2345)     LOS: 22 days    Time spent: 35 minutes    Ramiro Harvest, MD [facility]   To contact the attending provider between 7A-7P or the covering provider during after hours 7P-7A, please log into the web site [email or web address] and access using universal [place] password for that web site. If you do not have the password, please call the hospital operator.  09/27/2020, 11:03 AM  Dr. Roanna Banning

## 2020-09-28 LAB — BASIC METABOLIC PANEL
Anion gap: 6 (ref 5–15)
BUN: 25 mg/dL — ABNORMAL HIGH (ref 6–20)
CO2: 31 mmol/L (ref 22–32)
Calcium: 8.5 mg/dL — ABNORMAL LOW (ref 8.9–10.3)
Chloride: 105 mmol/L (ref 98–111)
Creatinine, Ser: 0.6 mg/dL (ref 0.44–1.00)
GFR, Estimated: >60 mL/min (ref >60)
Glucose, Bld: 103 mg/dL — ABNORMAL HIGH (ref 70–99)
Potassium: 3.6 mmol/L (ref 3.5–5.1)
Sodium: 142 mmol/L (ref 135–145)

## 2020-09-28 MED ORDER — POTASSIUM CHLORIDE 20 MEQ PO PACK
40.0000 meq | PACK | Freq: Once | ORAL | Status: AC
  Administered 2020-09-28: 40 meq via ORAL
  Filled 2020-09-28: qty 2

## 2020-09-28 MED ORDER — DEXTROSE-NACL 5-0.45 % IV SOLN: INTRAVENOUS | Status: DC

## 2020-09-28 MED ORDER — PANTOPRAZOLE SODIUM 40 MG PO PACK
40.0000 mg | PACK | Freq: Every day | ORAL | Status: DC
  Administered 2020-09-28 – 2020-10-04 (×7): 40 mg
  Filled 2020-09-28 (×9): qty 20

## 2020-09-28 NOTE — TOC Progression Note (Signed)
Transition of Care [facility]) - Progression Note    Patient Details  Name: Alicia Duncan MRN: [id number] Date of Birth: [birth year]  Transition of Care [facility]) CM/SW Contact  Allayne Butcher, RN Phone Number: 09/28/2020, 10:59 AM  Clinical Narrative:    [name] with [facility] reports that Selena Batten who came out to evaluate the patient on Wed has 2 potential homes for patient one in [place] and one in [place].  [name] should be following up with Selena Batten today.  [name] also put out a call to [employer] and is waiting to hear back from them as well.    Expected Discharge Plan: Group Home Barriers to Discharge: Other (must enter comment) (patient needs a specialized care home placement)  Expected Discharge Plan and Services Expected Discharge Plan: Group Home   Discharge Planning Services: CM Consult Post Acute Care Choice: Nursing Home Living arrangements for the past 2 months: Single Family Home Expected Discharge Date: 09/13/20               DME Arranged: N/A DME Agency: NA         HH Agency: NA         Social Determinants of Health (SDOH) Interventions    Readmission Risk Interventions No flowsheet data found.

## 2020-09-28 NOTE — Progress Notes (Signed)
PROGRESS NOTE    Alicia Duncan  TWK:[id number] DOB: July 03, 1975 DOA: 09/03/2020 PCP: [name], MD    Chief Complaint  Patient presents with   social work    Brief Narrative:  45 year old F with PMH of Rett syndrome, cognitive impairment, developmental delay, nonverbal, seizure disorder and debility with complete dependence for ADLs brought to ED by EMS due to unsafe home environment.  Patient's caregiver went on vacation for several days and family at home have not been able to adequately take care of the patient.  Reportedly, patient's father who is advanced dementia.  Patient's brother with schizophrenia.  Patient is medically stable but has no safe disposition yet.  Patient's insurance and TOC working d/c.   Last note from [facility]: Angie with [facility] [employer] will come to the hospital to do an evaluation on the patient to see if she would be a good fit for her facility.  Hopefully she will be able to come out early this week.   Recent complication of severe constipation     Assessment & Plan:   Principal Problem:   Failure to thrive in adult Active Problems:   Rett syndrome   Seizure disorder (HCC)   Severe protein-calorie malnutrition (HCC)   Constipation   Vaginal candidiasis   Fecal impaction (HCC)   Depression   1 failure to thrive/Rett syndrome with cognitive disability/developmental delay/nonverbal with oral phase dysphagia at baseline-requires total care -Continue current dysphagia 1 diet with aspiration precautions.  -Supportive care.   -Needs a safe discharge plan which TOC is working on.  2.  Fecal impaction/ileus -Patient had a NG tube placed which patient removed. -Manual disimpaction ordered however per RN note unable to touch stool. -Patient with multiple loose watery stools and bowel movements over the past few days.  -Continue current aggressive bowel regimen of Colace twice daily, MiraLAX twice daily, fleets enema nightly.  Dulcolax suppository as  needed. -Patient seen in consultation by general surgery who feel no surgical intervention at this time and recommending aggressive bowel regimen.   3.  Unwitnessed fall in the hospital -Noted to have been found on her knees next of the bed on 09/14/2020 with no injuries noted -No mental status changes noted. -It is noted that patient's brother was notified, and noted that brother stated patient with tendency to wiggle her way out of bed. -Fall precautions.  4.  History of seizure disorder -Stable. -Continue Ativan, Trileptal, Gabitril.    5.  Depression/anxiety -Continue fluoxetine, Ativan.    6.  Candida vulvovaginitis -Status post nystatin.  7.  Tachycardia -Resolved.  8.  Hypernatremia -Likely secondary to dehydration secondary to multiple bowel movements and poor oral intake. -Urine sodium 37, urine creatinine 97.   -Improved on D5W  -Change IV fluids to D5 half-normal saline.   -Repeat labs in the morning.   9.  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 -Patient with severe muscle depletion, severe fat depletion, percent weight loss 11.6% x 6 months. -Continue nutritional supplementation with Ensure. -Continue dysphagia 1 diet.   -Dietitian following.     DVT prophylaxis: Lovenox Code Status: DNR Family Communication: No family at bedside. Disposition:   Status is: Inpatient  Remains inpatient appropriate because:Unsafe d/c plan  Dispo: The patient is from: Home              Anticipated d/c is to:  TBD              Patient currently is medically stable to  d/c.   Difficult to place patient Yes       Consultants:  General surgery: Dr. Everlene Farrier 09/24/2020  Procedures:  CT abdomen pelvis 09/24/2020 Abdominal films 09/24/2020, 09/25/2020, 09/26/2020 Chest x-ray 09/24/2020   Antimicrobials:  Anti-infectives (From admission, onward)    None         Subjective: In bed.  Alert.  Following some commands.  Nonverbal.  Noted to  have had a bowel movement this morning.   Objective: Vitals:   09/27/20 1544 09/27/20 1941 09/28/20 0430 09/28/20 0722  BP: 116/69 110/69 (!) 92/58 (!) 89/63  Pulse: 90 80 65 82  Resp: 18 16 17 16   Temp: 97.9 F (36.6 C) 98.1 F (36.7 C) (!) 96.9 F (36.1 C) (!) 97.3 F (36.3 C)  TempSrc: Axillary Axillary Axillary   SpO2: 100% 100% 98% 98%  Weight:      Height:        Intake/Output Summary (Last 24 hours) at 09/28/2020 1104 Last data filed at 09/28/2020 0324 Gross per 24 hour  Intake 1561.25 ml  Output --  Net 1561.25 ml    Filed Weights   09/03/20 1613 09/03/20 2240  Weight: 40.8 kg 30.3 kg    Examination:  General exam: : NAD.  Alert.  Nonverbal. Respiratory system: CTA B anterior lung fields.  No wheezes, no rhonchi.  Normal respiratory effort. Cardiovascular system: Regular rate and rhythm no murmurs rubs or gallops.  No JVD.  No lower extremity edema.  Gastrointestinal system: Abdomen soft, nontender, nondistended, positive bowel sounds.  No rebound.  No guarding. Central nervous system: Alert and oriented. No focal neurological deficits. Extremities: Symmetric 5 x 5 power. Skin: No rashes, lesions or ulcers Psychiatry: Judgement and insight appear poor. Mood & affect appropriate.  Data Reviewed: I have personally reviewed following labs and imaging studies  CBC: Recent Labs  Lab 09/24/20 0131 09/27/20 0414  WBC 8.6 7.4  NEUTROABS 5.9 5.0  HGB 13.1 13.6  HCT 41.3 44.1  MCV 92.6 95.9  PLT 308 282     Basic Metabolic Panel: Recent Labs  Lab 09/24/20 0131 09/27/20 0414 09/27/20 1429 09/28/20 0638  NA 137 154* 146* 142  K 4.2 3.8 3.2* 3.6  CL 103 109 109 105  CO2 27 34* 29 31  GLUCOSE 113* 100* 183* 103*  BUN 22* 30* 27* 25*  CREATININE 0.49 0.53 0.45 0.60  CALCIUM 8.8* 9.8 9.2 8.5*  MG 1.8 2.6*  --   --   PHOS 4.1  --   --   --      GFR: Estimated Creatinine Clearance: 42 mL/min (by C-G formula based on SCr of 0.6 mg/dL).  Liver  Function Tests: Recent Labs  Lab 09/24/20 0131  AST 38  ALT 58*  ALKPHOS 38  BILITOT 0.4  PROT 6.8  ALBUMIN 3.4*     CBG: No results for input(s): GLUCAP in the last 168 hours.   No results found for this or any previous visit (from the past 240 hour(s)).       Radiology Studies: No results found.      Scheduled Meds:  baclofen  10 mg Per Tube TID   bisacodyl  10 mg Rectal Once   docusate  100 mg Per Tube BID   enoxaparin (LOVENOX) injection  30 mg Subcutaneous Q24H   feeding supplement  237 mL Per Tube TID BM   FLUoxetine  10 mg Per Tube Daily   LORazepam  1 mg Per Tube BID   nystatin  cream   Topical BID   OXcarbazepine  600 mg Per Tube BID   pantoprazole sodium  40 mg Per Tube Daily   polyethylene glycol  17 g Per Tube BID   sodium phosphate  1 enema Rectal QHS   tiaGABine  4 mg Per Tube BID   Continuous Infusions:  dextrose 5 % and 0.45% NaCl 75 mL/hr at 09/28/20 1012   promethazine (PHENERGAN) injection (IM or IVPB) 12.5 mg (09/23/20 2345)     LOS: 23 days    Time spent: 35 minutes    Ramiro Harvest, MD [facility]   To contact the attending provider between 7A-7P or the covering provider during after hours 7P-7A, please log into the web site [email or web address] and access using universal [place] password for that web site. If you do not have the password, please call the hospital operator.  09/28/2020, 11:04 AM  Dr. Roanna Banning

## 2020-09-28 NOTE — Progress Notes (Signed)
PHARMACIST - PHYSICIAN COMMUNICATION  DR: Janee Morn  CONCERNING: IV to Oral Route Change Policy  RECOMMENDATION: This patient is receiving pantoprazole by the intravenous route.  Based on criteria approved by the Pharmacy and Therapeutics Committee, the intravenous medication(s) is/are being converted to the equivalent oral dose form(s).   DESCRIPTION: These criteria include: The patient is eating (either orally or via tube) and/or has been taking other orally administered medications for a least 24 hours The patient has no evidence of active gastrointestinal bleeding or impaired GI absorption (gastrectomy, short bowel, patient on TNA or NPO).  If you have questions about this conversion, please contact the Pharmacy Department  []   [phone] )  ( [phone] [x]   [phone] )  [facility] []   [phone] )  [facility] []   [phone] )  [facility] []   [phone] )  [facility]   ( [phone], [facility] 09/28/2020 8:57 AM

## 2020-09-29 LAB — BASIC METABOLIC PANEL
Anion gap: 4 — ABNORMAL LOW (ref 5–15)
BUN: 15 mg/dL (ref 6–20)
CO2: 32 mmol/L (ref 22–32)
Calcium: 8.3 mg/dL — ABNORMAL LOW (ref 8.9–10.3)
Chloride: 103 mmol/L (ref 98–111)
Creatinine, Ser: 0.31 mg/dL — ABNORMAL LOW (ref 0.44–1.00)
GFR, Estimated: >60 mL/min (ref >60)
Glucose, Bld: 98 mg/dL (ref 70–99)
Potassium: 3.8 mmol/L (ref 3.5–5.1)
Sodium: 139 mmol/L (ref 135–145)

## 2020-09-29 MED ORDER — OXCARBAZEPINE 300 MG/5ML PO SUSP
600.0000 mg | Freq: Two times a day (BID) | ORAL | Status: DC
  Administered 2020-09-30 – 2020-10-05 (×11): 600 mg
  Filled 2020-09-29 (×12): qty 10

## 2020-09-29 NOTE — Progress Notes (Signed)
PROGRESS NOTE    Alicia Duncan  WCH:[id number] DOB: 02/17/1976 DOA: 09/03/2020 PCP: [name], MD    Chief Complaint  Patient presents with   social work    Brief Narrative:  45 year old F with PMH of Rett syndrome, cognitive impairment, developmental delay, nonverbal, seizure disorder and debility with complete dependence for ADLs brought to ED by EMS due to unsafe home environment.  Patient's caregiver went on vacation for several days and family at home have not been able to adequately take care of the patient.  Reportedly, patient's father who is advanced dementia.  Patient's brother with schizophrenia.  Patient is medically stable but has no safe disposition yet.  Patient's insurance and TOC working d/c.   Last note from [facility]: Angie with [employer] will come to the hospital to do an evaluation on the patient to see if she would be a good fit for her facility.  Hopefully she will be able to come out early this week.   Recent complication of severe constipation     Assessment & Plan:   Principal Problem:   Failure to thrive in adult Active Problems:   Rett syndrome   Seizure disorder (HCC)   Severe protein-calorie malnutrition (HCC)   Constipation   Vaginal candidiasis   Fecal impaction (HCC)   Depression   1 failure to thrive/Rett syndrome with cognitive disability/developmental delay/nonverbal with oral phase dysphagia at baseline-requires total care -Continue current dysphagia 1 diet with aspiration precautions.  -Supportive care.   -Needs a safe discharge plan which TOC is working on.  2.  Fecal impaction/ileus -Patient had a NG tube placed which patient removed. -Manual disimpaction ordered however per RN note unable to touch stool. -Patient with multiple loose watery stools and bowel movements over the past few days.  -Clinical improvement. -Continue current aggressive bowel regimen of Colace twice daily, MiraLAX twice daily, fleets enema nightly.   Dulcolax suppository as needed. -Patient seen in consultation by general surgery who feel no surgical intervention at this time and recommending aggressive bowel regimen.   3.  Unwitnessed fall in the hospital -Noted to have been found on her knees next of the bed on 09/14/2020 with no injuries noted -No mental status changes noted. -It is noted that patient's brother was notified, and noted that brother stated patient with tendency to wiggle her way out of bed. -Fall precautions.  4.  History of seizure disorder -Stable. -Continue Ativan, Trileptal, Gabitril.    5.  Depression/anxiety -Continue Ativan, fluoxetine.   6.  Candida vulvovaginitis -Status post nystatin.    7.  Tachycardia -Resolved.  8.  Hypernatremia -Likely secondary to dehydration secondary to multiple bowel movements and poor oral intake. -Urine sodium 37, urine creatinine 97.   -Improved on D5W. -Currently on D5 half-normal saline. -Follow.  9.  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 -Patient with severe muscle depletion, severe fat depletion, percent weight loss 11.6% x 6 months. -Continue nutritional supplementation with Ensure.   -Dysphagia 1 diet.   -Dietitian following.    DVT prophylaxis: Lovenox Code Status: DNR Family Communication: No family at bedside. Disposition:   Status is: Inpatient  Remains inpatient appropriate because:Unsafe d/c plan  Dispo: The patient is from: Home              Anticipated d/c is to:  TBD              Patient currently is medically stable to d/c.   Difficult to place patient  Yes       Consultants:  General surgery: Dr. Everlene Farrier 09/24/2020  Procedures:  CT abdomen pelvis 09/24/2020 Abdominal films 09/24/2020, 09/25/2020, 09/26/2020 Chest x-ray 09/24/2020   Antimicrobials:  Anti-infectives (From admission, onward)    None         Subjective: Sleeping.   Objective: Vitals:   09/29/20 0521 09/29/20 0738 09/29/20 0810  09/29/20 1125  BP: (!) 101/54 (!) 86/46 106/71 (!) 95/57  Pulse: (!) 54 (!) 57  73  Resp: 16 16  16   Temp: (!) 96.7 F (35.9 C) 98.2 F (36.8 C)  97.9 F (36.6 C)  TempSrc:      SpO2: 99% 100%  99%  Weight:      Height:        Intake/Output Summary (Last 24 hours) at 09/29/2020 1658 Last data filed at 09/29/2020 0953 Gross per 24 hour  Intake 1758.03 ml  Output --  Net 1758.03 ml    Filed Weights   09/03/20 1613 09/03/20 2240  Weight: 40.8 kg 30.3 kg    Examination:  General exam: : NAD. Respiratory system: Lungs clear to auscultation bilaterally.  No wheezes, no crackles, no rhonchi.  Normal respiratory effort. Cardiovascular system: RRR no murmurs rubs or gallops.  No JVD.  No lower extremity edema. Gastrointestinal system: Abdomen is soft, nontender, nondistended, positive bowel sounds.  No rebound.  No guarding.  Central nervous system: Alert and oriented. No focal neurological deficits. Extremities: Symmetric 5 x 5 power. Skin: No rashes, lesions or ulcers Psychiatry: Judgement and insight appear poor. Mood & affect appropriate.  Data Reviewed: I have personally reviewed following labs and imaging studies  CBC: Recent Labs  Lab 09/24/20 0131 09/27/20 0414  WBC 8.6 7.4  NEUTROABS 5.9 5.0  HGB 13.1 13.6  HCT 41.3 44.1  MCV 92.6 95.9  PLT 308 282     Basic Metabolic Panel: Recent Labs  Lab 09/24/20 0131 09/27/20 0414 09/27/20 1429 09/28/20 0638 09/29/20 0500  NA 137 154* 146* 142 139  K 4.2 3.8 3.2* 3.6 3.8  CL 103 109 109 105 103  CO2 27 34* 29 31 32  GLUCOSE 113* 100* 183* 103* 98  BUN 22* 30* 27* 25* 15  CREATININE 0.49 0.53 0.45 0.60 0.31*  CALCIUM 8.8* 9.8 9.2 8.5* 8.3*  MG 1.8 2.6*  --   --   --   PHOS 4.1  --   --   --   --      GFR: Estimated Creatinine Clearance: 42 mL/min (A) (by C-G formula based on SCr of 0.31 mg/dL (L)).  Liver Function Tests: Recent Labs  Lab 09/24/20 0131  AST 38  ALT 58*  ALKPHOS 38  BILITOT 0.4   PROT 6.8  ALBUMIN 3.4*     CBG: No results for input(s): GLUCAP in the last 168 hours.   No results found for this or any previous visit (from the past 240 hour(s)).       Radiology Studies: No results found.      Scheduled Meds:  baclofen  10 mg Per Tube TID   bisacodyl  10 mg Rectal Once   docusate  100 mg Per Tube BID   enoxaparin (LOVENOX) injection  30 mg Subcutaneous Q24H   feeding supplement  237 mL Per Tube TID BM   FLUoxetine  10 mg Per Tube Daily   LORazepam  1 mg Per Tube BID   nystatin cream   Topical BID   OXcarbazepine  600 mg Per Tube  BID   pantoprazole sodium  40 mg Per Tube Daily   polyethylene glycol  17 g Per Tube BID   sodium phosphate  1 enema Rectal QHS   tiaGABine  4 mg Per Tube BID   Continuous Infusions:  dextrose 5 % and 0.45% NaCl 75 mL/hr at 09/29/20 1356   promethazine (PHENERGAN) injection (IM or IVPB) 12.5 mg (09/23/20 2345)     LOS: 24 days    Time spent: 35 minutes    Ramiro Harvest, MD [facility]   To contact the attending provider between 7A-7P or the covering provider during after hours 7P-7A, please log into the web site [email or web address] and access using universal [place] password for that web site. If you do not have the password, please call the hospital operator.  09/29/2020, 4:58 PM  Dr. Roanna Banning

## 2020-09-30 LAB — BASIC METABOLIC PANEL
Anion gap: 4 — ABNORMAL LOW (ref 5–15)
BUN: 13 mg/dL (ref 6–20)
CO2: 28 mmol/L (ref 22–32)
Calcium: 8.1 mg/dL — ABNORMAL LOW (ref 8.9–10.3)
Chloride: 106 mmol/L (ref 98–111)
Creatinine, Ser: 0.39 mg/dL — ABNORMAL LOW (ref 0.44–1.00)
GFR, Estimated: >60 mL/min (ref >60)
Glucose, Bld: 96 mg/dL (ref 70–99)
Potassium: 4 mmol/L (ref 3.5–5.1)
Sodium: 138 mmol/L (ref 135–145)

## 2020-09-30 MED ORDER — BISACODYL 10 MG RE SUPP
10.0000 mg | Freq: Once | RECTAL | Status: AC
  Administered 2020-09-30: 14:00:00 10 mg via RECTAL
  Filled 2020-09-30: qty 1

## 2020-09-30 MED ORDER — SIMETHICONE 40 MG/0.6ML PO SUSP
40.0000 mg | Freq: Four times a day (QID) | ORAL | Status: DC
  Administered 2020-09-30 – 2020-11-01 (×120): 40 mg via ORAL
  Filled 2020-09-30: qty 0.6
  Filled 2020-09-30: qty 30
  Filled 2020-09-30 (×3): qty 0.6
  Filled 2020-09-30: qty 30
  Filled 2020-09-30 (×39): qty 0.6
  Filled 2020-09-30: qty 30
  Filled 2020-09-30: qty 0.6
  Filled 2020-09-30: qty 30
  Filled 2020-09-30 (×2): qty 0.6
  Filled 2020-09-30: qty 30
  Filled 2020-09-30 (×17): qty 0.6
  Filled 2020-09-30: qty 30
  Filled 2020-09-30 (×31): qty 0.6
  Filled 2020-09-30: qty 30
  Filled 2020-09-30 (×2): qty 0.6
  Filled 2020-09-30: qty 30
  Filled 2020-09-30 (×19): qty 0.6
  Filled 2020-09-30: qty 30
  Filled 2020-09-30 (×15): qty 0.6

## 2020-09-30 NOTE — Progress Notes (Signed)
Patient's abdomen distended.  Dulcolax supp given.  Pt had one BM post supp.  Simethicone initiated.  Patient ate well breakfast and lunch.  For dinner she ate about 25% of meal.  No signs of n/v.  No signs of pain or distress noted.

## 2020-09-30 NOTE — Progress Notes (Signed)
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  Patient's caregiver went on vacation for several days and family at home have not been able to adequately take care of the patient.  Reportedly, patient's father who is advanced dementia.  Patient's brother with schizophrenia.  Patient is medically stable but has no safe disposition yet.  Patient's insurance and TOC working d/c.   Last note from [facility]: Angie with [employer] will come to the hospital to do an evaluation on the patient to see if she would be a good fit for her facility.  Hopefully she will be able to come out early this week.   Recent complication of severe constipation     Assessment & Plan:   Principal Problem:   Failure to thrive in adult Active Problems:   Rett syndrome   Seizure disorder (HCC)   Severe protein-calorie malnutrition (HCC)   Constipation   Vaginal candidiasis   Fecal impaction (HCC)   Depression   1 failure to thrive/Rett syndrome with cognitive disability/developmental delay/nonverbal with oral phase dysphagia at baseline-requires total care -Continue current dysphagia 1 diet with aspiration precautions.  -Supportive care.   -Needs a safe discharge plan which TOC is working on.  2.  Fecal impaction/ileus -Patient had a NG tube placed which patient removed. -Manual disimpaction ordered however per RN note unable to touch stool. -Patient with multiple loose watery stools and bowel movements over the past few days.  -Patient with abdominal distention this morning however nontender to palpation. -Continue current aggressive bowel regimen of Colace  twice daily, MiraLAX twice daily, fleets enema nightly.  Dulcolax suppository as needed. -Given Dulcolax suppository now. -Placed on simethicone. -If no improvement with abdominal distention in next 24 hours we will repeat abdominal films. -Patient seen in consultation by general surgery who feel no surgical intervention at this time and recommending aggressive bowel regimen.  - -  3.  Unwitnessed fall in the hospital -Noted to have been found on her knees next of the bed on 09/14/2020 with no injuries noted -No mental status changes noted. -It is noted that patient's brother was notified, and noted that brother stated patient with tendency to wiggle her way out of bed. -Fall precautions.  4.  History of seizure disorder -Stable. -Continue Ativan, Trileptal, Gabitril.    5.  Depression/anxiety -Stable.  Continue Ativan, fluoxetine.    6.  Candida vulvovaginitis -Status post nystatin.    7.  Tachycardia -Resolved.  8.  Hypernatremia -Likely secondary to dehydration secondary to multiple bowel movements and poor oral intake. -Urine sodium 37, urine creatinine 97.   -Improved on D5W. -Decrease IV fluids of D5 half-normal saline to 50 cc an hour for the next 24 hours.  -Follow.  9.  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 -Patient with severe muscle depletion, severe fat depletion, percent weight loss 11.6% x 6 months. -Nutritional supplementation with Ensure.   -Dysphagia 1 diet.   -Dietitian following.    DVT prophylaxis: Lovenox Code Status: DNR Family Communication: No family at bedside. Disposition:   Status is: Inpatient  Remains inpatient appropriate because:Unsafe d/c plan  Dispo: The patient is  from: Home              Anticipated d/c is to:  TBD              Patient currently is medically stable to d/c.   Difficult to place patient Yes       Consultants:  General surgery: Dr. Everlene Farrier 09/24/2020  Procedures:  CT abdomen  pelvis 09/24/2020 Abdominal films 09/24/2020, 09/25/2020, 09/26/2020 Chest x-ray 09/24/2020   Antimicrobials:  Anti-infectives (From admission, onward)    None         Subjective: Awake.  Nonverbal.  Tolerating diet per RN.  Patient having BMs.  Objective: Vitals:   09/29/20 1958 09/30/20 0557 09/30/20 0742 09/30/20 1114  BP: (!) 94/59 98/64 (!) 99/59 122/83  Pulse: 80 72 61 94  Resp: 16 18 16 18   Temp: 98 F (36.7 C) 98 F (36.7 C) 97.6 F (36.4 C) 98.6 F (37 C)  TempSrc:      SpO2: 100% 96% 98% 96%  Weight:      Height:        Intake/Output Summary (Last 24 hours) at 09/30/2020 1246 Last data filed at 09/30/2020 0603 Gross per 24 hour  Intake 1520.44 ml  Output --  Net 1520.44 ml    Filed Weights   09/03/20 1613 09/03/20 2240  Weight: 40.8 kg 30.3 kg    Examination:  General exam: : NAD. Respiratory system: CTA B.  No wheezes, no crackles, no rhonchi.  Normal respiratory effort.  Cardiovascular system: RRR no murmurs rubs or gallops.  No JVD.  No lower extremity edema. Gastrointestinal system: Abdomen is soft, distended, nontender, positive bowel sounds.  No rebound.  No guarding.  Central nervous system: Alert. No focal neurological deficits. Extremities: Symmetric 5 x 5 power. Skin: No rashes, lesions or ulcers Psychiatry: Judgement and insight appear poor. Mood & affect appropriate.  Data Reviewed: I have personally reviewed following labs and imaging studies  CBC: Recent Labs  Lab 09/24/20 0131 09/27/20 0414  WBC 8.6 7.4  NEUTROABS 5.9 5.0  HGB 13.1 13.6  HCT 41.3 44.1  MCV 92.6 95.9  PLT 308 282     Basic Metabolic Panel: Recent Labs  Lab 09/24/20 0131 09/27/20 0414 09/27/20 1429 09/28/20 0638 09/29/20 0500 09/30/20 0441  NA 137 154* 146* 142 139 138  K 4.2 3.8 3.2* 3.6 3.8 4.0  CL 103 109 109 105 103 106  CO2 27 34* 29 31 32 28  GLUCOSE 113* 100* 183* 103* 98 96  BUN 22* 30* 27* 25* 15 13  CREATININE 0.49 0.53 0.45 0.60 0.31*  0.39*  CALCIUM 8.8* 9.8 9.2 8.5* 8.3* 8.1*  MG 1.8 2.6*  --   --   --   --   PHOS 4.1  --   --   --   --   --      GFR: Estimated Creatinine Clearance: 42 mL/min (A) (by C-G formula based on SCr of 0.39 mg/dL (L)).  Liver Function Tests: Recent Labs  Lab 09/24/20 0131  AST 38  ALT 58*  ALKPHOS 38  BILITOT 0.4  PROT 6.8  ALBUMIN 3.4*     CBG: No results for input(s): GLUCAP in the last 168 hours.   No results found for this or any previous visit (from the past 240 hour(s)).       Radiology Studies: No results found.      Scheduled Meds:  baclofen  10 mg Per Tube TID   bisacodyl  10 mg Rectal Once   docusate  100 mg Per Tube BID   enoxaparin (LOVENOX) injection  30 mg Subcutaneous Q24H   feeding supplement  237 mL Per Tube TID BM   FLUoxetine  10 mg Per Tube Daily   LORazepam  1 mg Per Tube BID   nystatin cream   Topical BID   OXcarbazepine  600 mg Per Tube BID   pantoprazole sodium  40 mg Per Tube Daily   polyethylene glycol  17 g Per Tube BID   sodium phosphate  1 enema Rectal QHS   tiaGABine  4 mg Per Tube BID   Continuous Infusions:  dextrose 5 % and 0.45% NaCl 50 mL/hr at 09/30/20 0919   promethazine (PHENERGAN) injection (IM or IVPB) 12.5 mg (09/23/20 2345)     LOS: 25 days    Time spent: 35 minutes    Ramiro Harvest, MD [facility]   To contact the attending provider between 7A-7P or the covering provider during after hours 7P-7A, please log into the web site [email or web address] and access using universal [place] password for that web site. If you do not have the password, please call the hospital operator.  09/30/2020, 12:46 PM  Dr. Roanna Banning

## 2020-10-01 ENCOUNTER — Inpatient Hospital Stay: Payer: Medicare Other

## 2020-10-01 LAB — BASIC METABOLIC PANEL
Anion gap: 3 — ABNORMAL LOW (ref 5–15)
BUN: 15 mg/dL (ref 6–20)
CO2: 23 mmol/L (ref 22–32)
Calcium: 7.9 mg/dL — ABNORMAL LOW (ref 8.9–10.3)
Chloride: 110 mmol/L (ref 98–111)
Creatinine, Ser: <0.3 mg/dL — ABNORMAL LOW (ref 0.44–1.00)
Glucose, Bld: 83 mg/dL (ref 70–99)
Potassium: 3.7 mmol/L (ref 3.5–5.1)
Sodium: 136 mmol/L (ref 135–145)

## 2020-10-01 NOTE — Progress Notes (Signed)
PROGRESS NOTE    Alicia Duncan  PFX:[id number] DOB: May 27, 1975 DOA: 09/03/2020 PCP: [name], MD    Chief Complaint  Patient presents with   social work    Brief Narrative:  45 year old F with PMH of Rett syndrome, cognitive impairment, developmental delay, nonverbal, seizure disorder and debility with complete dependence for ADLs brought to ED by EMS due to unsafe home environment.  Patient's caregiver went on vacation for several days and family at home have not been able to adequately take care of the patient.  Reportedly, patient's father who is advanced dementia.  Patient's brother with schizophrenia.  Patient is medically stable but has no safe disposition yet.  Patient's insurance and TOC working d/c.   Last note from [facility]: Angie with [facility] will come to the hospital to do an evaluation on the patient to see if she would be a good fit for her facility.  Hopefully she will be able to come out early this week.   Recent complication of severe constipation     Assessment & Plan:   Principal Problem:   Failure to thrive in adult Active Problems:   Rett syndrome   Seizure disorder (HCC)   Severe protein-calorie malnutrition (HCC)   Constipation   Vaginal candidiasis   Fecal impaction (HCC)   Depression   1 failure to thrive/Rett syndrome with cognitive disability/developmental delay/nonverbal with oral phase dysphagia at baseline-requires total care -Continue current dysphagia 1 diet with aspiration precautions.  -Supportive care.   -Needs a safe discharge plan which TOC is working on.  2.  Fecal impaction/ileus -Patient had a NG tube placed which patient removed. -Manual disimpaction ordered however per RN note unable to touch stool. -Patient with multiple loose watery stools and bowel movements over the past few days.  -Patient with abdominal distention this morning slightly improved over the past 24 hours however still distended.  Nontender to  palpation. -Patient received Dulcolax suppository yesterday evening with bowel movement. -Continue simethicone. -Continue current aggressive bowel regimen of Colace twice daily, MiraLAX twice daily, fleets enema nightly.  Dulcolax suppository as needed. -Check a KUB. -Patient seen in consultation by general surgery who feel no surgical intervention at this time and recommending aggressive bowel regimen.  - -  3.  Unwitnessed fall in the hospital -Noted to have been found on her knees next of the bed on 09/14/2020 with no injuries noted -No mental status changes noted. -It is noted that patient's brother was notified, and noted that brother stated patient with tendency to wiggle her way out of bed. -Fall precautions.  4.  History of seizure disorder -Stable. -Continue Ativan, Trileptal, Gabitril.    5.  Depression/anxiety -Continue Ativan, fluoxetine.    6.  Candida vulvovaginitis -Status post nystatin.    7.  Tachycardia -Resolved.  8.  Hypernatremia -Likely secondary to dehydration secondary to multiple bowel movements and poor oral intake. -Urine sodium 37, urine creatinine 97.   -Improved on D5W. -Sodium currently at 136.   -Saline lock IV fluids.   -Follow.  9.  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 -Patient with severe muscle depletion, severe fat depletion, percent weight loss 11.6% x 6 months. -Nutritional supplementation with Ensure discontinued yesterday due to concern for gaseous distention..   -Continue dysphagia 1 diet.   -Dietitian following.    DVT prophylaxis: Lovenox Code Status: DNR Family Communication: No family at bedside. Disposition:   Status is: Inpatient  Remains inpatient appropriate because:Unsafe d/c plan  Dispo:  The patient is from: Home              Anticipated d/c is to:  TBD              Patient currently is medically stable to d/c.   Difficult to place patient Yes       Consultants:   General surgery: Dr. Everlene Farrier 09/24/2020  Procedures:  CT abdomen pelvis 09/24/2020 Abdominal films 09/24/2020, 09/25/2020, 09/26/2020 Chest x-ray 09/24/2020   Antimicrobials:  Anti-infectives (From admission, onward)    None         Subjective: Awake.  Nonverbal.  Tolerating diet per RN.  Had bowel movement evening after Dulcolax suppository.  Objective: Vitals:   09/30/20 2320 10/01/20 0313 10/01/20 0745 10/01/20 1105  BP: 91/62 (!) 102/55 (!) 94/58 128/63  Pulse: 78 72 61 (!) 102  Resp: 14 16 16 16   Temp: 97.9 F (36.6 C) (!) 97.5 F (36.4 C) 98.1 F (36.7 C) 97.8 F (36.6 C)  TempSrc: Oral Oral Oral   SpO2: 100% 98% 100% 99%  Weight:      Height:        Intake/Output Summary (Last 24 hours) at 10/01/2020 1159 Last data filed at 10/01/2020 1012 Gross per 24 hour  Intake 618.15 ml  Output --  Net 618.15 ml    Filed Weights   09/03/20 1613 09/03/20 2240  Weight: 40.8 kg 30.3 kg    Examination:  General exam: : Respiratory system: CTA B.  No wheezes, no crackles, no rhonchi.  Normal respiratory effort.  Cardiovascular system: Regular rate rhythm no murmurs rubs or gallops.  No JVD.  No lower extremity edema. Gastrointestinal system: Abdomen is soft, distended, positive bowel sounds, nontender, no rebound, no guarding.  Central nervous system: Alert. No focal neurological deficits. Extremities: Symmetric 5 x 5 power. Skin: No rashes, lesions or ulcers Psychiatry: Judgement and insight appear poor. Mood & affect appropriate.  Data Reviewed: I have personally reviewed following labs and imaging studies  CBC: Recent Labs  Lab 09/27/20 0414  WBC 7.4  NEUTROABS 5.0  HGB 13.6  HCT 44.1  MCV 95.9  PLT 282     Basic Metabolic Panel: Recent Labs  Lab 09/27/20 0414 09/27/20 1429 09/28/20 0638 09/29/20 0500 09/30/20 0441 10/01/20 0444  NA 154* 146* 142 139 138 136  K 3.8 3.2* 3.6 3.8 4.0 3.7  CL 109 109 105 103 106 110  CO2 34* 29 31 32 28 23   GLUCOSE 100* 183* 103* 98 96 83  BUN 30* 27* 25* 15 13 15   CREATININE 0.53 0.45 0.60 0.31* 0.39* <0.30*  CALCIUM 9.8 9.2 8.5* 8.3* 8.1* 7.9*  MG 2.6*  --   --   --   --   --      GFR: CrCl cannot be calculated (This lab value cannot be used to calculate CrCl because it is not a number: <0.30).  Liver Function Tests: No results for input(s): AST, ALT, ALKPHOS, BILITOT, PROT, ALBUMIN in the last 168 hours.   CBG: No results for input(s): GLUCAP in the last 168 hours.   No results found for this or any previous visit (from the past 240 hour(s)).       Radiology Studies: No results found.      Scheduled Meds:  baclofen  10 mg Per Tube TID   docusate  100 mg Per Tube BID   enoxaparin (LOVENOX) injection  30 mg Subcutaneous Q24H   FLUoxetine  10 mg Per Tube  Daily   LORazepam  1 mg Per Tube BID   nystatin cream   Topical BID   OXcarbazepine  600 mg Per Tube BID   pantoprazole sodium  40 mg Per Tube Daily   polyethylene glycol  17 g Per Tube BID   simethicone  40 mg Oral QID   sodium phosphate  1 enema Rectal QHS   tiaGABine  4 mg Per Tube BID   Continuous Infusions:  dextrose 5 % and 0.45% NaCl 50 mL/hr at 09/30/20 2032   promethazine (PHENERGAN) injection (IM or IVPB) 12.5 mg (09/23/20 2345)     LOS: 26 days    Time spent: 35 minutes    Ramiro Harvest, MD [facility]   To contact the attending provider between 7A-7P or the covering provider during after hours 7P-7A, please log into the web site [email or web address] and access using universal [place] password for that web site. If you do not have the password, please call the hospital operator.  10/01/2020, 11:59 AM  Dr. Roanna Banning

## 2020-10-02 ENCOUNTER — Inpatient Hospital Stay: Payer: Medicare Other

## 2020-10-02 LAB — RENAL FUNCTION PANEL
Albumin: 2.7 g/dL — ABNORMAL LOW (ref 3.5–5.0)
Anion gap: 4 — ABNORMAL LOW (ref 5–15)
BUN: 11 mg/dL (ref 6–20)
CO2: 27 mmol/L (ref 22–32)
Calcium: 8.4 mg/dL — ABNORMAL LOW (ref 8.9–10.3)
Chloride: 108 mmol/L (ref 98–111)
Creatinine, Ser: 0.31 mg/dL — ABNORMAL LOW (ref 0.44–1.00)
GFR, Estimated: >60 mL/min (ref >60)
Glucose, Bld: 86 mg/dL (ref 70–99)
Phosphorus: 3.7 mg/dL (ref 2.5–4.6)
Potassium: 4.1 mmol/L (ref 3.5–5.1)
Sodium: 139 mmol/L (ref 135–145)

## 2020-10-02 LAB — MAGNESIUM: Magnesium: 2 mg/dL (ref 1.7–2.4)

## 2020-10-02 MED ORDER — BISACODYL 10 MG RE SUPP
10.0000 mg | Freq: Every day | RECTAL | Status: DC
  Administered 2020-10-02 – 2020-10-03 (×2): 10 mg via RECTAL
  Filled 2020-10-02 (×2): qty 1

## 2020-10-02 MED ORDER — BOOST / RESOURCE BREEZE PO LIQD CUSTOM
1.0000 | Freq: Three times a day (TID) | ORAL | Status: DC
  Administered 2020-10-02 – 2020-10-11 (×29): 1 via ORAL

## 2020-10-02 NOTE — Progress Notes (Signed)
PROGRESS NOTE    Alicia Duncan  [id number]:[id number] DOB: [birth year] DOA: 09/03/2020 PCP: [name], MD    Chief Complaint  Patient presents with   social work    Brief Narrative:  45 year old F with PMH of Rett syndrome, cognitive impairment, developmental delay, nonverbal, seizure disorder and debility with complete dependence for ADLs brought to ED by EMS due to unsafe home environment.  Patient's caregiver went on vacation for several days and family at home have not been able to adequately take care of the patient.  Reportedly, patient's father who is advanced dementia.  Patient's brother with schizophrenia.  Patient is medically stable but has no safe disposition yet.  Patient's insurance and TOC working d/c.   Last note from [facility]: Angie with [facility] will come to the hospital to do an evaluation on the patient to see if she would be a good fit for her facility.  Hopefully she will be able to come out early this week.   Recent complication of severe constipation     Assessment & Plan:   Principal Problem:   Failure to thrive in adult Active Problems:   Rett syndrome   Seizure disorder (HCC)   Severe protein-calorie malnutrition (HCC)   Constipation   Vaginal candidiasis   Fecal impaction (HCC)   Depression   1 failure to thrive/Rett syndrome with cognitive disability/developmental delay/nonverbal with oral phase dysphagia at baseline-requires total care -Continue current dysphagia 1 diet with aspiration precautions.  -Supportive care.   -Needs a safe discharge plan which TOC is working on.  2.  Fecal impaction/ileus -Patient had a NG tube placed which patient removed. -Manual disimpaction ordered however per RN note unable to touch stool. -Patient with multiple loose watery stools and bowel movements over the past few days.  -Patient with abdominal distention on 10/01/2020 which has improved over the past 24 hours.  Abdomen is soft.  Abdomen is nontender to  palpation. -Abdominal films from 10/01/2020 with significant stomach distention. -Repeat abdominal films this morning with interim resolution of gastric distention, diffuse small and large bowel distention noted.  Findings consistent with adynamic ileus, no large stool burden noted. -Patient with bowel movements per RN. -Continue simethicone, daily Dulcolax suppositories, Colace twice daily, MiraLAX twice daily, fleets enema nightly. -Patient seen in consultation by general surgery who feel no surgical intervention at this time and recommending aggressive bowel regimen.  - -  3.  Unwitnessed fall in the hospital -Noted to have been found on her knees next of the bed on 09/14/2020 with no injuries noted -No mental status changes noted. -It is noted that patient's brother was notified, and noted that brother stated patient with tendency to wiggle her way out of bed. -Fall precautions.  4.  History of seizure disorder -Stable. -Continue Ativan, Trileptal, Gabitril.    5.  Depression/anxiety -Stable.   -Continue fluoxetine, Ativan.   6.  Candida vulvovaginitis -Status post nystatin.    7.  Tachycardia -Resolved.  8.  Hypernatremia -Likely secondary to dehydration secondary to multiple bowel movements and poor oral intake. -Urine sodium 37, urine creatinine 97.   -Improved on D5W. -Sodium level currently at 139.   -IV fluids have been saline locked.   -Follow.  9.  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 -Patient with severe muscle depletion, severe fat depletion, percent weight loss 11.6% x 6 months. -Nutritional supplementation with Ensure discontinued due to gaseous distention.   -Continue dysphagia 1 diet  -Dietitian following.  DVT prophylaxis: Lovenox Code Status: DNR Family Communication: No family at bedside. Disposition:   Status is: Inpatient  Remains inpatient appropriate because:Unsafe d/c plan  Dispo: The patient is from:  Home              Anticipated d/c is to:  TBD              Patient currently is medically stable to d/c.   Difficult to place patient Yes       Consultants:  General surgery: Dr. Everlene Farrier 09/24/2020  Procedures:  CT abdomen pelvis 09/24/2020 Abdominal films 09/24/2020, 09/25/2020, 09/26/2020, 10/01/2020 Chest x-ray 09/24/2020   Antimicrobials:  Anti-infectives (From admission, onward)    None         Subjective: Awake.  Nonverbal.  Having bowel movement.   Objective: Vitals:   10/01/20 2356 10/02/20 0331 10/02/20 0740 10/02/20 1117  BP: 99/62 105/70 (!) 94/56 100/64  Pulse: 81 83 65 98  Resp: 18 18 16 16   Temp: 98 F (36.7 C) 98.5 F (36.9 C) 98 F (36.7 C) 98.1 F (36.7 C)  TempSrc:      SpO2: 97% 97% 97% 100%  Weight:      Height:        Intake/Output Summary (Last 24 hours) at 10/02/2020 1123 Last data filed at 10/01/2020 1846 Gross per 24 hour  Intake 240 ml  Output --  Net 240 ml    Filed Weights   09/03/20 1613 09/03/20 2240  Weight: 40.8 kg 30.3 kg    Examination:  General exam: : NAD Respiratory system: CTA B.  No wheezes, no crackles, no rhonchi.  Normal respiratory effort.  Cardiovascular system: RRR no murmurs rubs or gallops.  No JVD.  No lower extremity edema. Gastrointestinal system: Abdomen is soft, less distended, positive bowel sounds, nontender to palpation, no rebound, no guarding. Central nervous system: Alert.  Nonverbal.  Moving extremities spontaneously.  No focal neurological deficits.  Extremities: Symmetric 5 x 5 power. Skin: No rashes, lesions or ulcers Psychiatry: Judgement and insight appear poor. Mood & affect appropriate.  Data Reviewed: I have personally reviewed following labs and imaging studies  CBC: Recent Labs  Lab 09/27/20 0414  WBC 7.4  NEUTROABS 5.0  HGB 13.6  HCT 44.1  MCV 95.9  PLT 282     Basic Metabolic Panel: Recent Labs  Lab 09/27/20 0414 09/27/20 1429 09/28/20 0638 09/29/20 0500  09/30/20 0441 10/01/20 0444 10/02/20 0416  NA 154*   < > 142 139 138 136 139  K 3.8   < > 3.6 3.8 4.0 3.7 4.1  CL 109   < > 105 103 106 110 108  CO2 34*   < > 31 32 28 23 27   GLUCOSE 100*   < > 103* 98 96 83 86  BUN 30*   < > 25* 15 13 15 11   CREATININE 0.53   < > 0.60 0.31* 0.39* <0.30* 0.31*  CALCIUM 9.8   < > 8.5* 8.3* 8.1* 7.9* 8.4*  MG 2.6*  --   --   --   --   --  2.0  PHOS  --   --   --   --   --   --  3.7   < > = values in this interval not displayed.     GFR: Estimated Creatinine Clearance: 42 mL/min (A) (by C-G formula based on SCr of 0.31 mg/dL (L)).  Liver Function Tests: Recent Labs  Lab 10/02/20 0416  ALBUMIN  2.7*     CBG: No results for input(s): GLUCAP in the last 168 hours.   No results found for this or any previous visit (from the past 240 hour(s)).       Radiology Studies: DG Abd 1 View  Result Date: 10/02/2020 CLINICAL DATA:  Distention. EXAM: ABDOMEN - 1 VIEW COMPARISON:  10/01/2020. FINDINGS: Interim resolution of gastric distention. Diffuse small and large bowel distention noted. Findings consistent with adynamic ileus. No large stool burden. No free air identified. Hemidiaphragms incompletely imaged. Prior thoracolumbar lumbosacral fusion. Visualized hardware intact. IMPRESSION: Interim resolution of gastric distention. Diffuse small and large bowel distention noted. Findings consistent with adynamic ileus. No large stool burden noted. Electronically Signed   By: Maisie Fus  Register   On: 10/02/2020 08:09   DG Abd 1 View  Result Date: 10/01/2020 CLINICAL DATA:  Abdominal distension EXAM: ABDOMEN - 1 VIEW COMPARISON:  September 26, 2020 FINDINGS: There is diffuse distension of the stomach with air. There is air throughout small bowel without small bowel dilatation. A lesser degree of air is noted in the colon without colonic dilatation. No air-fluid levels. No free air evident on supine examination. Postoperative change noted throughout the lower thoracic  and lumbar spine as well as in the sacroiliac joint regions. IMPRESSION: Stomach distended with air. No focal bowel obstruction evident. No free air appreciable on supine examination. Extensive postoperative changes noted. Electronically Signed   By: Bretta Bang III M.D.   On: 10/01/2020 13:45        Scheduled Meds:  baclofen  10 mg Per Tube TID   bisacodyl  10 mg Rectal Daily   docusate  100 mg Per Tube BID   enoxaparin (LOVENOX) injection  30 mg Subcutaneous Q24H   feeding supplement  1 Container Oral TID BM   FLUoxetine  10 mg Per Tube Daily   LORazepam  1 mg Per Tube BID   nystatin cream   Topical BID   OXcarbazepine  600 mg Per Tube BID   pantoprazole sodium  40 mg Per Tube Daily   polyethylene glycol  17 g Per Tube BID   simethicone  40 mg Oral QID   sodium phosphate  1 enema Rectal QHS   tiaGABine  4 mg Per Tube BID   Continuous Infusions:  promethazine (PHENERGAN) injection (IM or IVPB) 12.5 mg (09/23/20 2345)     LOS: 27 days    Time spent: 35 minutes    Ramiro Harvest, MD [facility]   To contact the attending provider between 7A-7P or the covering provider during after hours 7P-7A, please log into the web site [email or web address] and access using universal [place] password for that web site. If you do not have the password, please call the hospital operator.  10/02/2020, 11:23 AM  Dr. Roanna Banning

## 2020-10-02 NOTE — Progress Notes (Signed)
Nutrition Follow-up  DOCUMENTATION CODES:  Severe malnutrition in context of chronic illness, Underweight  INTERVENTION:  Obtain updated weight when possible.  Add Boost Breeze po TID, each supplement provides 250 kcal and 9 grams of protein.  NUTRITION DIAGNOSIS:  Severe Malnutrition related to chronic illness (Rett syndrome) as evidenced by severe muscle depletion, severe fat depletion, percent weight loss (11.6% x 6 months). - ongoing  GOAL:  Patient will meet greater than or equal to 90% of their needs, Weight gain - not meeting  MONITOR:  PO intake, Supplement acceptance, Labs, Weight trends  REASON FOR ASSESSMENT:  Other (Comment), Malnutrition Screening Tool (Low BMI, 13.48)    ASSESSMENT:  Pt brought to ED after concerns were raised over her care at home. Caregiver has been on vacation for several days and family at home have not been able to adequately take care of the patient. 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 6/20 - stool burden on imaging, NPO, NGT placed 6/21 - NGT removed, disimpaction of bowel 6/22 - diet advanced to D1/thin 6/27 - MD d/c'd Ensure due to concern for gaseous distention  Pt has not been weighed since 5/30, almost a month ago. RD to order new weight be taken.  Pt remains inpatient at this time awaiting placement. MD reports medically stable for discharge once facility placement is secured.  Since Ensure Enlive was discontinued, RD to trial Boost Breeze to help with supplementing intake.   Average Meal Intake: 5/30-5/31: 50% intake x 1 recorded meal 6/1-6/7: 38% intake x 2 recorded meals 6/8-6/14: 19% intake x 8 recorded meals 6/15-6/19: 36% intake x 7 recorded meals 6/23-6/27: 50% intake x 6 recorded meals  Medications: reviewed; dulcolax, colace BID, Ativan BID, Protonix, miralax BID  Labs: reviewed  Diet  Order:   Diet Order             DIET - DYS 1 Room service appropriate? Yes; Fluid consistency: Thin  Diet effective now           Diet - low sodium heart healthy                  EDUCATION NEEDS:  Not appropriate for education at this time  Skin:  Skin Assessment: Reviewed RN Assessment  Last BM:  10/01/20 - Type 7, medium  Height:  Ht Readings from Last 1 Encounters:  09/03/20 4\' 11"  (1.499 m)   Weight:  Wt Readings from Last 1 Encounters:  09/03/20 30.3 kg   Ideal Body Weight:  44.3 kg  BMI:  Body mass index is 13.49 kg/m.  Estimated Nutritional Needs:  Kcal:  1400-1600 kcal/d Protein:  70-85 g/d Fluid:  >1500 mL/d  09/05/20, RD, LDN (she/her/hers) Registered Dietitian I After-Hours/Weekend Pager # in [place]

## 2020-10-02 NOTE — Progress Notes (Signed)
   10/02/20 2026  Assess: MEWS Score  Temp 98.1 F (36.7 C)  BP 100/67  Pulse Rate (!) 109  Resp 16  SpO2 98 % (Rechecked)  O2 Device Room Air  Assess: MEWS Score  MEWS Temp 0  MEWS Systolic 1  MEWS Pulse 1  MEWS RR 0  MEWS LOC 0  MEWS Score 2  MEWS Score Color Yellow  Assess: if the MEWS score is Yellow or Red  Were vital signs taken at a resting state? No  Focused Assessment No change from prior assessment  Does the patient meet 2 or more of the SIRS criteria? No  Early Detection of Sepsis Score *See Row Information* Low  MEWS guidelines implemented *See Row Information* Yes  Take Vital Signs  Increase Vital Sign Frequency  Yellow: Q 2hr X 2 then Q 4hr X 2, if remains yellow, continue Q 4hrs  Escalate  MEWS: Escalate Yellow: discuss with charge nurse/RN and consider discussing with provider and RRT  Notify: Charge Nurse/RN  Name of Charge Nurse/RN Notified Marny Lowenstein, RN  Date Charge Nurse/RN Notified 10/02/20  Time Charge Nurse/RN Notified 2026  Document  Patient Outcome Stabilized after interventions  Progress note created (see row info) Yes  Assess: SIRS CRITERIA  SIRS Temperature  0  SIRS Pulse 1  SIRS Respirations  0  SIRS WBC 0  SIRS Score Sum  1  Patient alert, no sign and symptoms of pain noted, MEWS 2, VS q2 hours, will continue to monitor.

## 2020-10-03 LAB — BASIC METABOLIC PANEL
Anion gap: 6 (ref 5–15)
BUN: 11 mg/dL (ref 6–20)
CO2: 25 mmol/L (ref 22–32)
Calcium: 8.3 mg/dL — ABNORMAL LOW (ref 8.9–10.3)
Chloride: 107 mmol/L (ref 98–111)
Creatinine, Ser: 0.33 mg/dL — ABNORMAL LOW (ref 0.44–1.00)
GFR, Estimated: >60 mL/min (ref >60)
Glucose, Bld: 80 mg/dL (ref 70–99)
Potassium: 3.7 mmol/L (ref 3.5–5.1)
Sodium: 138 mmol/L (ref 135–145)

## 2020-10-03 LAB — CBC
HCT: 33.2 % — ABNORMAL LOW (ref 36.0–46.0)
Hemoglobin: 10.7 g/dL — ABNORMAL LOW (ref 12.0–15.0)
MCH: 29.2 pg (ref 26.0–34.0)
MCHC: 32.2 g/dL (ref 30.0–36.0)
MCV: 90.7 fL (ref 80.0–100.0)
Platelets: 139 10*3/uL — ABNORMAL LOW (ref 150–400)
RBC: 3.66 MIL/uL — ABNORMAL LOW (ref 3.87–5.11)
RDW: 14.8 % (ref 11.5–15.5)
WBC: 10.7 10*3/uL — ABNORMAL HIGH (ref 4.0–10.5)
nRBC: 0 % (ref 0.0–0.2)

## 2020-10-03 LAB — MAGNESIUM: Magnesium: 2 mg/dL (ref 1.7–2.4)

## 2020-10-03 NOTE — Progress Notes (Signed)
PROGRESS NOTE    Alicia Duncan  KCL:[id number] DOB: Sep 16, 1975 DOA: 09/03/2020 PCP: [name], MD   Brief Narrative:  45 year old F with PMH of Rett syndrome, cognitive impairment, developmental delay, nonverbal, seizure disorder and debility with complete dependence for ADLs brought to ED by EMS due to unsafe home environment.  Patient's caregiver went on vacation for several days and family at home have not been able to adequately take care of the patient.  Reportedly, patient's father who is advanced dementia.  Patient's brother with schizophrenia.  Patient is medically stable but has no safe disposition yet.  Patient's insurance and TOC working d/c.   Last note from [facility]: Angie with [facility] will come to the hospital to do an evaluation on the patient to see if she would be a good fit for her facility.  Hopefully she will be able to come out early this week.   Assessment & Plan:   Principal Problem:   Failure to thrive in adult Active Problems:   Rett syndrome   Seizure disorder (HCC)   Severe protein-calorie malnutrition (HCC)   Constipation   Vaginal candidiasis   Fecal impaction (HCC)   Depression  1 failure to thrive/Rett syndrome with cognitive disability/developmental delay/nonverbal with oral phase dysphagia at baseline-requires total care -Continue current dysphagia 1 diet with aspiration precautions.   -Supportive care.   -Needs a safe discharge plan which TOC is working on. -Group home evaluate this week  2.  Fecal impaction/ileus -Patient had a NG tube placed which patient removed. -Manual disimpaction ordered however per RN note unable to touch stool. -Patient with multiple loose watery stools and bowel movements over the past few days. -Patient with abdominal distention on 10/01/2020 which has improved over the past 24 hours.  Abdomen is soft.  Abdomen is nontender to palpation. -Abdominal films from 10/01/2020 with significant stomach  distention. -Repeat abdominal films this morning with interim resolution of gastric distention, diffuse small and large bowel distention noted.  Findings consistent with adynamic ileus, no large stool burden noted. -Patient with bowel movements per RN. -Continue simethicone, daily Dulcolax suppositories, Colace twice daily, MiraLAX twice daily, fleets enema nightly. -Patient seen in consultation by general surgery who feel no surgical intervention at this time and recommending aggressive bowel regimen. -We will continue to monitor for BMs   3.  Unwitnessed fall in the hospital -Noted to have been found on her knees next of the bed on 09/14/2020 with no injuries noted -No mental status changes noted. -It is noted that patient's brother was notified, and noted that brother stated patient with tendency to wiggle her way out of bed. -Fall precautions.  4.  History of seizure disorder -Stable. -Continue Ativan, Trileptal, Gabitril.     5.  Depression/anxiety -Stable.   -Continue fluoxetine, Ativan.   6.  Candida vulvovaginitis -Status post nystatin.    7.  Tachycardia -Resolved.  8.  Hypernatremia -Likely secondary to dehydration secondary to multiple bowel movements and poor oral intake. -Urine sodium 37, urine creatinine 97.   -Improved on D5W. -Sodium level currently at 139.   -IV fluids have been saline locked.   -Follow.  9.  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 -Patient with severe muscle depletion, severe fat depletion, percent weight loss 11.6% x 6 months. -Nutritional supplementation with Ensure discontinued due to gaseous distention.   -Continue dysphagia 1 diet  -Dietitian following.     DVT prophylaxis: Lovenox Code Status: DNR Family Communication: None today Disposition  Plan: Status is: Inpatient  Remains inpatient appropriate because:Unsafe d/c plan  Dispo: The patient is from: Home              Anticipated d/c is to:   TBD              Patient currently is medically stable to d/c.   Difficult to place patient Yes   Patient remains medically stable for discharge at this time.  Lacking safe disposition plan.  TOC aware and is looking for options.    Level of care: Med-Surg  Consultants:  General surgery, signed off  Procedures:  None  Antimicrobials:  None   Subjective: Seen and examined.  Visibly no distress.  Verbally unresponsive  Objective: Vitals:   10/03/20 0016 10/03/20 0449 10/03/20 0728 10/03/20 1209  BP: 101/66 111/69 (!) 92/59 91/63  Pulse: 90 75 63 99  Resp: 16 16 16 16   Temp: 98.2 F (36.8 C) (!) 97.5 F (36.4 C) 97.8 F (36.6 C) 97.6 F (36.4 C)  TempSrc: Oral Oral    SpO2: 95% 98% 98% 98%  Weight:      Height:        Intake/Output Summary (Last 24 hours) at 10/03/2020 1309 Last data filed at 10/03/2020 1045 Gross per 24 hour  Intake 0 ml  Output --  Net 0 ml   Filed Weights   09/03/20 1613 09/03/20 2240  Weight: 40.8 kg 30.3 kg    Examination:  General exam: Visibly no distress.  Appears frail Respiratory system: Clear to auscultation. Respiratory effort normal. Cardiovascular system: S1 & S2 heard, RRR. No JVD, murmurs, rubs, gallops or clicks. No pedal edema. Gastrointestinal system: Thin/scaphoid.  Nontender.  Nondistended.  Normal bowel sounds Central nervous system: Opens eyes.  Oriented x0.  Nonverbal Extremities: Diffusely decreased power bilateral upper and lower extremities Skin: Pale, thin.  No obvious rashes or lesions Psychiatry: Judgement and insight appear impaired. Mood & affect flattened.     Data Reviewed: I have personally reviewed following labs and imaging studies  CBC: Recent Labs  Lab 09/27/20 0414 10/03/20 0440  WBC 7.4 10.7*  NEUTROABS 5.0  --   HGB 13.6 10.7*  HCT 44.1 33.2*  MCV 95.9 90.7  PLT 282 139*   Basic Metabolic Panel: Recent Labs  Lab 09/27/20 0414 09/27/20 1429 09/29/20 0500 09/30/20 0441  10/01/20 0444 10/02/20 0416 10/03/20 0440  NA 154*   < > 139 138 136 139 138  K 3.8   < > 3.8 4.0 3.7 4.1 3.7  CL 109   < > 103 106 110 108 107  CO2 34*   < > 32 28 23 27 25   GLUCOSE 100*   < > 98 96 83 86 80  BUN 30*   < > 15 13 15 11 11   CREATININE 0.53   < > 0.31* 0.39* <0.30* 0.31* 0.33*  CALCIUM 9.8   < > 8.3* 8.1* 7.9* 8.4* 8.3*  MG 2.6*  --   --   --   --  2.0 2.0  PHOS  --   --   --   --   --  3.7  --    < > = values in this interval not displayed.   GFR: Estimated Creatinine Clearance: 42 mL/min (A) (by C-G formula based on SCr of 0.33 mg/dL (L)). Liver Function Tests: Recent Labs  Lab 10/02/20 0416  ALBUMIN 2.7*   No results for input(s): LIPASE, AMYLASE in the last 168 hours. No results  for input(s): AMMONIA in the last 168 hours. Coagulation Profile: No results for input(s): INR, PROTIME in the last 168 hours. Cardiac Enzymes: No results for input(s): CKTOTAL, CKMB, CKMBINDEX, TROPONINI in the last 168 hours. BNP (last 3 results) No results for input(s): PROBNP in the last 8760 hours. HbA1C: No results for input(s): HGBA1C in the last 72 hours. CBG: No results for input(s): GLUCAP in the last 168 hours. Lipid Profile: No results for input(s): CHOL, HDL, LDLCALC, TRIG, CHOLHDL, LDLDIRECT in the last 72 hours. Thyroid Function Tests: No results for input(s): TSH, T4TOTAL, FREET4, T3FREE, THYROIDAB in the last 72 hours. Anemia Panel: No results for input(s): VITAMINB12, FOLATE, FERRITIN, TIBC, IRON, RETICCTPCT in the last 72 hours. Sepsis Labs: No results for input(s): PROCALCITON, LATICACIDVEN in the last 168 hours.  No results found for this or any previous visit (from the past 240 hour(s)).       Radiology Studies: DG Abd 1 View  Result Date: 10/02/2020 CLINICAL DATA:  Distention. EXAM: ABDOMEN - 1 VIEW COMPARISON:  10/01/2020. FINDINGS: Interim resolution of gastric distention. Diffuse small and large bowel distention noted. Findings consistent with  adynamic ileus. No large stool burden. No free air identified. Hemidiaphragms incompletely imaged. Prior thoracolumbar lumbosacral fusion. Visualized hardware intact. IMPRESSION: Interim resolution of gastric distention. Diffuse small and large bowel distention noted. Findings consistent with adynamic ileus. No large stool burden noted. Electronically Signed   By: Maisie Fus  Register   On: 10/02/2020 08:09        Scheduled Meds:  baclofen  10 mg Per Tube TID   bisacodyl  10 mg Rectal Daily   docusate  100 mg Per Tube BID   enoxaparin (LOVENOX) injection  30 mg Subcutaneous Q24H   feeding supplement  1 Container Oral TID BM   FLUoxetine  10 mg Per Tube Daily   LORazepam  1 mg Per Tube BID   nystatin cream   Topical BID   OXcarbazepine  600 mg Per Tube BID   pantoprazole sodium  40 mg Per Tube Daily   polyethylene glycol  17 g Per Tube BID   simethicone  40 mg Oral QID   sodium phosphate  1 enema Rectal QHS   tiaGABine  4 mg Per Tube BID   Continuous Infusions:  promethazine (PHENERGAN) injection (IM or IVPB) 12.5 mg (09/23/20 2345)     LOS: 28 days    Time spent: 25 minutes    Tresa Moore, MD [facility] Pager [phone]  If 7PM-7AM, please contact night-coverage 10/03/2020, 1:09 PM

## 2020-10-03 NOTE — Progress Notes (Signed)
Called family regarding needing additional diapers for patient - unable to provide at this time. Perlie Mayo, RN

## 2020-10-03 NOTE — TOC Progression Note (Signed)
Transition of Care [facility]) - Progression Note    Patient Details  Name: [name] MRN: [id number] Date of Birth: 1975/05/07  [facility]) CM/SW Contact  Allayne Butcher, RN Phone Number: 10/03/2020, 1:45 PM  Clinical Narrative:    Message left with [facility] health worker, [name], for return call for any updates on placement for [facility].     Expected Discharge Plan: Group Home Barriers to Discharge: Other (must enter comment) (patient needs a specialized care home placement)  Expected Discharge Plan and Services Expected Discharge Plan: Group Home   Discharge Planning Services: CM Consult Post Acute Care Choice: Nursing Home Living arrangements for the past 2 months: Single Family Home Expected Discharge Date: 09/13/20               DME Arranged: N/A DME Agency: NA         HH Agency: NA         Social Determinants of Health (SDOH) Interventions    Readmission Risk Interventions No flowsheet data found.

## 2020-10-04 DIAGNOSIS — R627 Adult failure to thrive: Secondary | ICD-10-CM | POA: Diagnosis not present

## 2020-10-04 MED ORDER — POLYETHYLENE GLYCOL 3350 17 G PO PACK
17.0000 g | PACK | Freq: Every day | ORAL | Status: DC
  Administered 2020-10-04 – 2020-10-05 (×2): 17 g
  Filled 2020-10-04 (×2): qty 1

## 2020-10-04 NOTE — Progress Notes (Signed)
PROGRESS NOTE    Alicia Duncan  VZC:[id number] DOB: 05/13/1975 DOA: 09/03/2020 PCP: [name], MD   Brief Narrative:  45 year old F with PMH of Rett syndrome, cognitive impairment, developmental delay, nonverbal, seizure disorder and debility with complete dependence for ADLs brought to ED by EMS due to unsafe home environment.  Patient's caregiver went on vacation for several days and family at home have not been able to adequately take care of the patient.  Reportedly, patient's father who is advanced dementia.  Patient's brother with schizophrenia.  Patient is medically stable but has no safe disposition yet.  Patient's insurance and TOC working d/c.   Last note from [facility]: Angie with [facility] will come to the hospital to do an evaluation on the patient to see if she would be a good fit for her facility.  Hopefully she will be able to come out early this week.   Assessment & Plan:   Principal Problem:   Failure to thrive in adult Active Problems:   Rett syndrome   Seizure disorder (HCC)   Severe protein-calorie malnutrition (HCC)   Constipation   Vaginal candidiasis   Fecal impaction (HCC)   Depression  1 failure to thrive/Rett syndrome with cognitive disability/developmental delay/nonverbal with oral phase dysphagia at baseline-requires total care -Continue current dysphagia 1 diet with aspiration precautions.   -Supportive care.   -Needs a safe discharge plan which TOC is working on. -Group home evaluate this week  2.  Fecal impaction/ileus -Patient had a NG tube placed which patient removed. -Manual disimpaction ordered however per RN note unable to touch stool. -Patient with multiple loose watery stools and bowel movements over the past few days. -Patient with abdominal distention on 10/01/2020 which has improved over the past 24 hours.  Abdomen is soft.  Abdomen is nontender to palpation. -Abdominal films from 10/01/2020 with significant stomach  distention. -Repeat abdominal films this morning with interim resolution of gastric distention, diffuse small and large bowel distention noted.  Findings consistent with adynamic ileus, no large stool burden noted. -Patient with bowel movements per RN. -Patient seen in consultation by general surgery who feel no surgical intervention at this time and recommending aggressive bowel regimen. Plan: Scheduled docusate and MiraLAX As needed enemas and suppositories Monitor for BM   3.  Unwitnessed fall in the hospital -Noted to have been found on her knees next of the bed on 09/14/2020 with no injuries noted -No mental status changes noted. -It is noted that patient's brother was notified, and noted that brother stated patient with tendency to wiggle her way out of bed. -Fall precautions.  4.  History of seizure disorder -Stable. -Continue Ativan, Trileptal, Gabitril.     5.  Depression/anxiety -Stable.   -Continue fluoxetine, Ativan.   6.  Candida vulvovaginitis -Status post nystatin.    7.  Tachycardia -Resolved.  8.  Hypernatremia -Likely secondary to dehydration secondary to multiple bowel movements and poor oral intake. -Urine sodium 37, urine creatinine 97.   -Improved on D5W. -Sodium level currently at 139.   -IV fluids have been saline locked.   -Follow.  9.  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 -Patient with severe muscle depletion, severe fat depletion, percent weight loss 11.6% x 6 months. -Nutritional supplementation with Ensure discontinued due to gaseous distention.   -Continue dysphagia 1 diet  -Dietitian following.     DVT prophylaxis: Lovenox Code Status: DNR Family Communication: None today Disposition Plan: Status is: Inpatient  Remains inpatient appropriate  because:Unsafe d/c plan  Dispo: The patient is from: Home              Anticipated d/c is to:  TBD              Patient currently is medically stable to d/c.    Difficult to place patient Yes   Patient remains medically stable for discharge at this time.  Lacking safe disposition plan.  TOC aware and is looking for options.    Level of care: Med-Surg  Consultants:  General surgery, signed off  Procedures:  None  Antimicrobials:  None   Subjective: Seen and examined.  Resting comfortably.  Visibly no distress.  Objective: Vitals:   10/03/20 2021 10/04/20 0436 10/04/20 0530 10/04/20 0719  BP: (!) 114/54 94/67  (!) 88/54  Pulse: 82 93  96  Resp: 16 16  20   Temp: 97.8 F (36.6 C) 97.6 F (36.4 C)  98.2 F (36.8 C)  TempSrc:    Oral  SpO2: 100% 95%  93%  Weight:   28.3 kg   Height:        Intake/Output Summary (Last 24 hours) at 10/04/2020 1020 Last data filed at 10/04/2020 1010 Gross per 24 hour  Intake 240 ml  Output --  Net 240 ml   Filed Weights   09/03/20 1613 09/03/20 2240 10/04/20 0530  Weight: 40.8 kg 30.3 kg 28.3 kg    Examination:  General exam: Visibly no distress.  Appears frail Respiratory system: Clear to auscultation. Respiratory effort normal. Cardiovascular system: S1 & S2 heard, RRR. No JVD, murmurs, rubs, gallops or clicks. No pedal edema. Gastrointestinal system: Thin/scaphoid.  Nontender.  Nondistended.  Normal bowel sounds Central nervous system: Opens eyes.  Oriented x0.  Nonverbal Extremities: Diffusely decreased power bilateral upper and lower extremities Skin: Pale, thin.  No obvious rashes or lesions Psychiatry: Judgement and insight appear impaired. Mood & affect flattened.     Data Reviewed: I have personally reviewed following labs and imaging studies  CBC: Recent Labs  Lab 10/03/20 0440  WBC 10.7*  HGB 10.7*  HCT 33.2*  MCV 90.7  PLT 139*   Basic Metabolic Panel: Recent Labs  Lab 09/29/20 0500 09/30/20 0441 10/01/20 0444 10/02/20 0416 10/03/20 0440  NA 139 138 136 139 138  K 3.8 4.0 3.7 4.1 3.7  CL 103 106 110 108 107  CO2 32 28 23 27 25   GLUCOSE 98 96 83 86 80   BUN 15 13 15 11 11   CREATININE 0.31* 0.39* <0.30* 0.31* 0.33*  CALCIUM 8.3* 8.1* 7.9* 8.4* 8.3*  MG  --   --   --  2.0 2.0  PHOS  --   --   --  3.7  --    GFR: Estimated Creatinine Clearance: 39.3 mL/min (A) (by C-G formula based on SCr of 0.33 mg/dL (L)). Liver Function Tests: Recent Labs  Lab 10/02/20 0416  ALBUMIN 2.7*   No results for input(s): LIPASE, AMYLASE in the last 168 hours. No results for input(s): AMMONIA in the last 168 hours. Coagulation Profile: No results for input(s): INR, PROTIME in the last 168 hours. Cardiac Enzymes: No results for input(s): CKTOTAL, CKMB, CKMBINDEX, TROPONINI in the last 168 hours. BNP (last 3 results) No results for input(s): PROBNP in the last 8760 hours. HbA1C: No results for input(s): HGBA1C in the last 72 hours. CBG: No results for input(s): GLUCAP in the last 168 hours. Lipid Profile: No results for input(s): CHOL, HDL, LDLCALC, TRIG, CHOLHDL, LDLDIRECT  in the last 72 hours. Thyroid Function Tests: No results for input(s): TSH, T4TOTAL, FREET4, T3FREE, THYROIDAB in the last 72 hours. Anemia Panel: No results for input(s): VITAMINB12, FOLATE, FERRITIN, TIBC, IRON, RETICCTPCT in the last 72 hours. Sepsis Labs: No results for input(s): PROCALCITON, LATICACIDVEN in the last 168 hours.  No results found for this or any previous visit (from the past 240 hour(s)).       Radiology Studies: No results found.      Scheduled Meds:  baclofen  10 mg Per Tube TID   docusate  100 mg Per Tube BID   enoxaparin (LOVENOX) injection  30 mg Subcutaneous Q24H   feeding supplement  1 Container Oral TID BM   FLUoxetine  10 mg Per Tube Daily   LORazepam  1 mg Per Tube BID   nystatin cream   Topical BID   OXcarbazepine  600 mg Per Tube BID   pantoprazole sodium  40 mg Per Tube Daily   polyethylene glycol  17 g Per Tube Daily   simethicone  40 mg Oral QID   tiaGABine  4 mg Per Tube BID   Continuous Infusions:  promethazine  (PHENERGAN) injection (IM or IVPB) 12.5 mg (09/23/20 2345)     LOS: 29 days    Time spent: 15 minutes    Tresa Moore, MD [facility] Pager [phone]  If 7PM-7AM, please contact night-coverage 10/04/2020, 10:20 AM

## 2020-10-04 NOTE — TOC Progression Note (Signed)
Transition of Care [facility]) - Progression Note    Patient Details  Name: SANDI TOWE MRN: [id number] Date of Birth: 1976/03/12  Transition [facility] Care [facility]) CM/SW Contact  Allayne Butcher, RN Phone Number: 10/04/2020, 11:16 AM  Clinical Narrative:    RNCM update from [place].  Person from [facility] who was scheduled to come and see Meranda this past weekend never showed.  [name] has a lead on placement in [place] and they will come to assess Rashida this Saturday.  [name] is hopeful that this will be a good fit.   Expected Discharge Plan: Group Home Barriers to Discharge: Other (must enter comment) (patient needs a specialized care home placement)  Expected Discharge Plan and Services Expected Discharge Plan: Group Home   Discharge Planning Services: CM Consult Post Acute Care Choice: Nursing Home Living arrangements for the past 2 months: Single Family Home Expected Discharge Date: 09/13/20               DME Arranged: N/A DME Agency: NA         HH Agency: NA         Social Determinants of Health (SDOH) Interventions    Readmission Risk Interventions No flowsheet data found.

## 2020-10-05 DIAGNOSIS — R627 Adult failure to thrive: Secondary | ICD-10-CM | POA: Diagnosis not present

## 2020-10-05 MED ORDER — TIAGABINE HCL 4 MG PO TABS
4.0000 mg | ORAL_TABLET | Freq: Two times a day (BID) | ORAL | Status: DC
  Administered 2020-10-05 – 2020-11-01 (×54): 4 mg via ORAL
  Filled 2020-10-05 (×59): qty 1

## 2020-10-05 MED ORDER — ONDANSETRON HCL 4 MG PO TABS: 4.0000 mg | ORAL_TABLET | Freq: Four times a day (QID) | ORAL | Status: DC | PRN

## 2020-10-05 MED ORDER — POLYETHYLENE GLYCOL 3350 17 G PO PACK
17.0000 g | PACK | Freq: Every day | ORAL | Status: DC
  Administered 2020-10-06 – 2020-10-24 (×16): 17 g via ORAL
  Filled 2020-10-05 (×16): qty 1

## 2020-10-05 MED ORDER — ALBUMIN HUMAN 25 % IV SOLN
12.5000 g | Freq: Once | INTRAVENOUS | Status: AC
  Administered 2020-10-05: 15:00:00 12.5 g via INTRAVENOUS
  Filled 2020-10-05: qty 50

## 2020-10-05 MED ORDER — DOCUSATE SODIUM 50 MG/5ML PO LIQD
100.0000 mg | Freq: Two times a day (BID) | ORAL | Status: DC
  Administered 2020-10-05 – 2020-11-01 (×52): 100 mg via ORAL
  Filled 2020-10-05 (×56): qty 10

## 2020-10-05 MED ORDER — LORAZEPAM 2 MG/ML PO CONC
1.0000 mg | Freq: Two times a day (BID) | ORAL | Status: DC
  Administered 2020-10-05 – 2020-11-01 (×54): 1 mg via ORAL
  Filled 2020-10-05 (×54): qty 1

## 2020-10-05 MED ORDER — ONDANSETRON HCL 4 MG/2ML IJ SOLN
4.0000 mg | Freq: Four times a day (QID) | INTRAMUSCULAR | Status: DC | PRN
  Administered 2020-10-22: 4 mg via INTRAVENOUS
  Filled 2020-10-05: qty 2

## 2020-10-05 MED ORDER — PANTOPRAZOLE SODIUM 40 MG PO PACK
40.0000 mg | PACK | Freq: Every day | ORAL | Status: DC
  Administered 2020-10-05 – 2020-10-31 (×27): 40 mg via ORAL
  Filled 2020-10-05 (×28): qty 20

## 2020-10-05 MED ORDER — FLUOXETINE HCL 20 MG/5ML PO SOLN
10.0000 mg | Freq: Every day | ORAL | Status: DC
  Administered 2020-10-06 – 2020-11-01 (×27): 10 mg via ORAL
  Filled 2020-10-05 (×2): qty 2.5
  Filled 2020-10-05: qty 5
  Filled 2020-10-05 (×4): qty 2.5
  Filled 2020-10-05: qty 5
  Filled 2020-10-05 (×5): qty 2.5
  Filled 2020-10-05: qty 5
  Filled 2020-10-05 (×7): qty 2.5
  Filled 2020-10-05 (×2): qty 5
  Filled 2020-10-05 (×5): qty 2.5

## 2020-10-05 MED ORDER — OXCARBAZEPINE 300 MG/5ML PO SUSP
600.0000 mg | Freq: Two times a day (BID) | ORAL | Status: DC
  Administered 2020-10-05 – 2020-11-01 (×54): 600 mg via ORAL
  Filled 2020-10-05 (×56): qty 10

## 2020-10-05 MED ORDER — BACLOFEN 10 MG PO TABS
10.0000 mg | ORAL_TABLET | Freq: Three times a day (TID) | ORAL | Status: DC
  Administered 2020-10-05 – 2020-11-01 (×80): 10 mg via ORAL
  Filled 2020-10-05 (×80): qty 1

## 2020-10-05 MED ORDER — ACETAMINOPHEN 650 MG RE SUPP: 650.0000 mg | Freq: Four times a day (QID) | RECTAL | Status: DC | PRN

## 2020-10-05 MED ORDER — ACETAMINOPHEN 325 MG PO TABS
650.0000 mg | ORAL_TABLET | Freq: Four times a day (QID) | ORAL | Status: DC | PRN
  Administered 2020-10-23 – 2020-10-31 (×5): 650 mg via ORAL
  Filled 2020-10-05 (×5): qty 2

## 2020-10-05 NOTE — Progress Notes (Signed)
PROGRESS NOTE    Alicia Duncan  WLS:[id number] DOB: 05/19/1975 DOA: 09/03/2020 PCP: [name], MD   Brief Narrative:  45 year old F with PMH of Rett syndrome, cognitive impairment, developmental delay, nonverbal, seizure disorder and debility with complete dependence for ADLs brought to ED by EMS due to unsafe home environment.  Patient's caregiver went on vacation for several days and family at home have not been able to adequately take care of the patient.  Reportedly, patient's father who is advanced dementia.  Patient's brother with schizophrenia.  Patient is medically stable but has no safe disposition yet.  Patient's insurance and TOC working d/c.   Last note from [facility]: Angie with [facility] will come to the hospital to do an evaluation on the patient to see if she would be a good fit for her facility.  Hopefully she will be able to come out early this week.   Assessment & Plan:   Principal Problem:   Failure to thrive in adult Active Problems:   Rett syndrome   Seizure disorder (HCC)   Severe protein-calorie malnutrition (HCC)   Constipation   Vaginal candidiasis   Fecal impaction (HCC)   Depression  1 failure to thrive/Rett syndrome with cognitive disability/developmental delay/nonverbal with oral phase dysphagia at baseline-requires total care -Continue current dysphagia 1 diet with aspiration precautions.   -Supportive care.   -Needs a safe discharge plan which TOC is working on. -Per [employer] notes person from [place] who is scheduled to come and see the patient never showed up.  TOC working on another facility in [place].  They are scheduled to come assess patient Saturday, 7/2  2.  Fecal impaction/ileus -Patient had a NG tube placed which patient removed. -Manual disimpaction ordered however per RN note unable to touch stool. -Patient with multiple loose watery stools and bowel movements over the past few days. -Patient with abdominal distention on 10/01/2020  which has improved over the past 24 hours.  Abdomen is soft.  Abdomen is nontender to palpation. -Abdominal films from 10/01/2020 with significant stomach distention. -Repeat abdominal films this morning with interim resolution of gastric distention, diffuse small and large bowel distention noted.  Findings consistent with adynamic ileus, no large stool burden noted. -Patient with bowel movements per RN. -Patient seen in consultation by general surgery who feel no surgical intervention at this time and recommending aggressive bowel regimen. Plan: Scheduled docusate and MiraLAX As needed enemas and suppositories Monitor for BM   3.  Unwitnessed fall in the hospital -Noted to have been found on her knees next of the bed on 09/14/2020 with no injuries noted -No mental status changes noted. -It is noted that patient's brother was notified, and noted that brother stated patient with tendency to wiggle her way out of bed. -Fall precautions.  4.  History of seizure disorder -Stable. -Continue Ativan, Trileptal, Gabitril.     5.  Depression/anxiety -Stable.   -Continue fluoxetine, Ativan.   6.  Candida vulvovaginitis -Status post nystatin.    7.  Tachycardia -Resolved.  8.  Hypernatremia -Likely secondary to dehydration secondary to multiple bowel movements and poor oral intake. -Urine sodium 37, urine creatinine 97.   -Improved on D5W. -Sodium level currently at 139.   -IV fluids have been saline locked.   -Follow.  9.  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 -Patient with severe muscle depletion, severe fat depletion, percent weight loss 11.6% x 6 months. -Nutritional supplementation with Ensure discontinued due to gaseous distention.   -  Continue dysphagia 1 diet  -Dietitian following.     DVT prophylaxis: Lovenox Code Status: DNR Family Communication: None today Disposition Plan: Status is: Inpatient  Remains inpatient appropriate  because:Unsafe d/c plan  Dispo: The patient is from: Home              Anticipated d/c is to:  TBD              Patient currently is medically stable to d/c.   Difficult to place patient Yes   Patient remains medically stable for discharge at this time.  Lacking safe disposition plan.  TOC aware and is looking for options.    Level of care: Med-Surg  Consultants:  General surgery, signed off  Procedures:  None  Antimicrobials:  None   Subjective: Seen and examined.  Resting comfortably.  Visibly no distress.  Objective: Vitals:   10/05/20 0500 10/05/20 0522 10/05/20 0825 10/05/20 0826  BP:  (!) 91/57 (!) 89/58 (!) 82/54  Pulse:  85 91 81  Resp:  16 16   Temp:  (!) 97.3 F (36.3 C) 98.2 F (36.8 C)   TempSrc:  Oral    SpO2:  99% 98%   Weight: 32.9 kg     Height:        Intake/Output Summary (Last 24 hours) at 10/05/2020 1037 Last data filed at 10/05/2020 1012 Gross per 24 hour  Intake 840 ml  Output --  Net 840 ml   Filed Weights   09/03/20 2240 10/04/20 0530 10/05/20 0500  Weight: 30.3 kg 28.3 kg 32.9 kg    Examination:  General exam: Visibly no distress.  Appears frail Respiratory system: Clear to auscultation. Respiratory effort normal. Cardiovascular system: S1 & S2 heard, RRR. No JVD, murmurs, rubs, gallops or clicks. No pedal edema. Gastrointestinal system: Thin/scaphoid.  Nontender.  Nondistended.  Normal bowel sounds Central nervous system: Opens eyes.  Oriented x0.  Nonverbal Extremities: Diffusely decreased power bilateral upper and lower extremities Skin: Pale, thin.  No obvious rashes or lesions Psychiatry: Judgement and insight appear impaired. Mood & affect flattened.     Data Reviewed: I have personally reviewed following labs and imaging studies  CBC: Recent Labs  Lab 10/03/20 0440  WBC 10.7*  HGB 10.7*  HCT 33.2*  MCV 90.7  PLT 139*   Basic Metabolic Panel: Recent Labs  Lab 09/29/20 0500 09/30/20 0441 10/01/20 0444  10/02/20 0416 10/03/20 0440  NA 139 138 136 139 138  K 3.8 4.0 3.7 4.1 3.7  CL 103 106 110 108 107  CO2 32 28 23 27 25   GLUCOSE 98 96 83 86 80  BUN 15 13 15 11 11   CREATININE 0.31* 0.39* <0.30* 0.31* 0.33*  CALCIUM 8.3* 8.1* 7.9* 8.4* 8.3*  MG  --   --   --  2.0 2.0  PHOS  --   --   --  3.7  --    GFR: Estimated Creatinine Clearance: 45.6 mL/min (A) (by C-G formula based on SCr of 0.33 mg/dL (L)). Liver Function Tests: Recent Labs  Lab 10/02/20 0416  ALBUMIN 2.7*   No results for input(s): LIPASE, AMYLASE in the last 168 hours. No results for input(s): AMMONIA in the last 168 hours. Coagulation Profile: No results for input(s): INR, PROTIME in the last 168 hours. Cardiac Enzymes: No results for input(s): CKTOTAL, CKMB, CKMBINDEX, TROPONINI in the last 168 hours. BNP (last 3 results) No results for input(s): PROBNP in the last 8760 hours. HbA1C: No results for input(s):  HGBA1C in the last 72 hours. CBG: No results for input(s): GLUCAP in the last 168 hours. Lipid Profile: No results for input(s): CHOL, HDL, LDLCALC, TRIG, CHOLHDL, LDLDIRECT in the last 72 hours. Thyroid Function Tests: No results for input(s): TSH, T4TOTAL, FREET4, T3FREE, THYROIDAB in the last 72 hours. Anemia Panel: No results for input(s): VITAMINB12, FOLATE, FERRITIN, TIBC, IRON, RETICCTPCT in the last 72 hours. Sepsis Labs: No results for input(s): PROCALCITON, LATICACIDVEN in the last 168 hours.  No results found for this or any previous visit (from the past 240 hour(s)).       Radiology Studies: No results found.      Scheduled Meds:  baclofen  10 mg Per Tube TID   docusate  100 mg Per Tube BID   enoxaparin (LOVENOX) injection  30 mg Subcutaneous Q24H   feeding supplement  1 Container Oral TID BM   FLUoxetine  10 mg Per Tube Daily   LORazepam  1 mg Per Tube BID   nystatin cream   Topical BID   OXcarbazepine  600 mg Per Tube BID   pantoprazole sodium  40 mg Per Tube Daily    polyethylene glycol  17 g Per Tube Daily   simethicone  40 mg Oral QID   tiaGABine  4 mg Per Tube BID   Continuous Infusions:  promethazine (PHENERGAN) injection (IM or IVPB) 12.5 mg (09/23/20 2345)     LOS: 30 days    Time spent: 15 minutes    Tresa Moore, MD [facility] Pager [phone]  If 7PM-7AM, please contact night-coverage 10/05/2020, 10:37 AM

## 2020-10-06 DIAGNOSIS — R627 Adult failure to thrive: Secondary | ICD-10-CM | POA: Diagnosis not present

## 2020-10-06 LAB — CBC
HCT: 31.1 % — ABNORMAL LOW (ref 36.0–46.0)
Hemoglobin: 10.1 g/dL — ABNORMAL LOW (ref 12.0–15.0)
MCH: 28.9 pg (ref 26.0–34.0)
MCHC: 32.5 g/dL (ref 30.0–36.0)
MCV: 88.9 fL (ref 80.0–100.0)
Platelets: 128 10*3/uL — ABNORMAL LOW (ref 150–400)
RBC: 3.5 MIL/uL — ABNORMAL LOW (ref 3.87–5.11)
RDW: 15.2 % (ref 11.5–15.5)
WBC: 4 10*3/uL (ref 4.0–10.5)
nRBC: 0 % (ref 0.0–0.2)

## 2020-10-06 NOTE — Progress Notes (Signed)
PROGRESS NOTE    Alicia Duncan  KNL:[id number] DOB: 02-May-1975 DOA: 09/03/2020 PCP: [name], MD   Brief Narrative:  45 year old F with PMH of Rett syndrome, cognitive impairment, developmental delay, nonverbal, seizure disorder and debility with complete dependence for ADLs brought to ED by EMS due to unsafe home environment.  Patient's caregiver went on vacation for several days and family at home have not been able to adequately take care of the patient.  Reportedly, patient's father who is advanced dementia.  Patient's brother with schizophrenia.  Patient is medically stable but has no safe disposition yet.  Patient's insurance and TOC working d/c.   Last note from [facility]: Angie with [facility] will come to the hospital to do an evaluation on the patient to see if she would be a good fit for her facility.  Hopefully she will be able to come out early this week.   Assessment & Plan:   Principal Problem:   Failure to thrive in adult Active Problems:   Rett syndrome   Seizure disorder (HCC)   Severe protein-calorie malnutrition (HCC)   Constipation   Vaginal candidiasis   Fecal impaction (HCC)   Depression  1 failure to thrive/Rett syndrome with cognitive disability/developmental delay/nonverbal with oral phase dysphagia at baseline-requires total care -Continue current dysphagia 1 diet with aspiration precautions.   -Supportive care.   -Needs a safe discharge plan which TOC is working on. -Per [employer] notes person from [place] who is scheduled to come and see the patient never showed up.  TOC working on another facility in [place].  Apparently rep from facility will come to evaluate 7/2  2.  Fecal impaction/ileus -Patient had a NG tube placed which patient removed. -Manual disimpaction ordered however per RN note unable to touch stool. -Patient with multiple loose watery stools and bowel movements over the past few days. -Patient with abdominal distention on 10/01/2020  which has improved over the past 24 hours.  Abdomen is soft.  Abdomen is nontender to palpation. -Abdominal films from 10/01/2020 with significant stomach distention. -Repeat abdominal films this morning with interim resolution of gastric distention, diffuse small and large bowel distention noted.  Findings consistent with adynamic ileus, no large stool burden noted. -Patient with bowel movements per RN. -Patient seen in consultation by general surgery who feel no surgical intervention at this time and recommending aggressive bowel regimen. Plan: Change bowel regimen to PRN As needed enemas and suppositories Monitor for BM Avoid constipation   3.  Unwitnessed fall in the hospital -Noted to have been found on her knees next of the bed on 09/14/2020 with no injuries noted -No mental status changes noted. -It is noted that patient's brother was notified, and noted that brother stated patient with tendency to wiggle her way out of bed. -Fall precautions.  4.  History of seizure disorder -Stable. -Continue Ativan, Trileptal, Gabitril.     5.  Depression/anxiety -Stable.   -Continue fluoxetine, Ativan.   6.  Candida vulvovaginitis -Status post nystatin.    7.  Tachycardia -Resolved.  8.  Hypernatremia -Likely secondary to dehydration secondary to multiple bowel movements and poor oral intake. -Urine sodium 37, urine creatinine 97.   -Improved on D5W. -Sodium level currently at 139.   -IV fluids have been saline locked.   -Follow.  9.  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 -Patient with severe muscle depletion, severe fat depletion, percent weight loss 11.6% x 6 months. -Nutritional supplementation with Ensure discontinued due to  gaseous distention.   -Continue dysphagia 1 diet  -Dietitian following.     DVT prophylaxis: Lovenox Code Status: DNR Family Communication: None today Disposition Plan: Status is: Inpatient  Remains inpatient  appropriate because:Unsafe d/c plan  Dispo: The patient is from: Home              Anticipated d/c is to:  TBD              Patient currently is medically stable to d/c.   Difficult to place patient Yes   Patient remains medically stable for discharge at this time.  Lacking safe disposition plan.  TOC aware and is looking for options.    Level of care: Med-Surg  Consultants:  General surgery, signed off  Procedures:  None  Antimicrobials:  None   Subjective: Seen and examined.  Resting comfortably.  Visibly no distress.  Objective: Vitals:   10/05/20 2351 10/06/20 0500 10/06/20 0611 10/06/20 0742  BP: (!) 88/53  (!) 98/53 (!) 92/53  Pulse: 89  63 70  Resp: 16  16 15   Temp: (!) 97.5 F (36.4 C)  (!) 97.4 F (36.3 C) (!) 97.4 F (36.3 C)  TempSrc: Oral  Oral Oral  SpO2: 97%  96% 98%  Weight:  27.8 kg    Height:        Intake/Output Summary (Last 24 hours) at 10/06/2020 1014 Last data filed at 10/05/2020 1500 Gross per 24 hour  Intake 137.03 ml  Output --  Net 137.03 ml   Filed Weights   10/04/20 0530 10/05/20 0500 10/06/20 0500  Weight: 28.3 kg 32.9 kg 27.8 kg    Examination:  General exam: Visibly no distress.  Appears frail Respiratory system: Clear to auscultation. Respiratory effort normal. Cardiovascular system: S1 & S2 heard, RRR. No JVD, murmurs, rubs, gallops or clicks. No pedal edema. Gastrointestinal system: Thin/scaphoid.  Nontender.  Nondistended.  Normal bowel sounds Central nervous system: Opens eyes.  Oriented x0.  Nonverbal Extremities: Diffusely decreased power bilateral upper and lower extremities Skin: Pale, thin.  No obvious rashes or lesions Psychiatry: Judgement and insight appear impaired. Mood & affect flattened.     Data Reviewed: I have personally reviewed following labs and imaging studies  CBC: Recent Labs  Lab 10/03/20 0440 10/06/20 0616  WBC 10.7* 4.0  HGB 10.7* 10.1*  HCT 33.2* 31.1*  MCV 90.7 88.9  PLT 139* 128*    Basic Metabolic Panel: Recent Labs  Lab 09/30/20 0441 10/01/20 0444 10/02/20 0416 10/03/20 0440  NA 138 136 139 138  K 4.0 3.7 4.1 3.7  CL 106 110 108 107  CO2 28 23 27 25   GLUCOSE 96 83 86 80  BUN 13 15 11 11   CREATININE 0.39* <0.30* 0.31* 0.33*  CALCIUM 8.1* 7.9* 8.4* 8.3*  MG  --   --  2.0 2.0  PHOS  --   --  3.7  --    GFR: Estimated Creatinine Clearance: 38.6 mL/min (A) (by C-G formula based on SCr of 0.33 mg/dL (L)). Liver Function Tests: Recent Labs  Lab 10/02/20 0416  ALBUMIN 2.7*   No results for input(s): LIPASE, AMYLASE in the last 168 hours. No results for input(s): AMMONIA in the last 168 hours. Coagulation Profile: No results for input(s): INR, PROTIME in the last 168 hours. Cardiac Enzymes: No results for input(s): CKTOTAL, CKMB, CKMBINDEX, TROPONINI in the last 168 hours. BNP (last 3 results) No results for input(s): PROBNP in the last 8760 hours. HbA1C: No results for input(s):  HGBA1C in the last 72 hours. CBG: No results for input(s): GLUCAP in the last 168 hours. Lipid Profile: No results for input(s): CHOL, HDL, LDLCALC, TRIG, CHOLHDL, LDLDIRECT in the last 72 hours. Thyroid Function Tests: No results for input(s): TSH, T4TOTAL, FREET4, T3FREE, THYROIDAB in the last 72 hours. Anemia Panel: No results for input(s): VITAMINB12, FOLATE, FERRITIN, TIBC, IRON, RETICCTPCT in the last 72 hours. Sepsis Labs: No results for input(s): PROCALCITON, LATICACIDVEN in the last 168 hours.  No results found for this or any previous visit (from the past 240 hour(s)).       Radiology Studies: No results found.      Scheduled Meds:  baclofen  10 mg Oral TID   docusate  100 mg Oral BID   enoxaparin (LOVENOX) injection  30 mg Subcutaneous Q24H   feeding supplement  1 Container Oral TID BM   FLUoxetine  10 mg Oral Daily   LORazepam  1 mg Oral BID   nystatin cream   Topical BID   OXcarbazepine  600 mg Oral BID   pantoprazole sodium  40 mg Oral  Daily   polyethylene glycol  17 g Oral Daily   simethicone  40 mg Oral QID   tiaGABine  4 mg Oral BID   Continuous Infusions:  promethazine (PHENERGAN) injection (IM or IVPB) 12.5 mg (09/23/20 2345)     LOS: 31 days    Time spent: 15 minutes    Tresa Moore, MD [facility] Pager [phone]  If 7PM-7AM, please contact night-coverage 10/06/2020, 10:14 AM

## 2020-10-07 DIAGNOSIS — R627 Adult failure to thrive: Secondary | ICD-10-CM | POA: Diagnosis not present

## 2020-10-07 NOTE — Progress Notes (Signed)
PROGRESS NOTE    Alicia Duncan  NUU:[id number] DOB: 05-31-1975 DOA: 09/03/2020 PCP: [name], MD   Brief Narrative:  45 year old F with PMH of Rett syndrome, cognitive impairment, developmental delay, nonverbal, seizure disorder and debility with complete dependence for ADLs brought to ED by EMS due to unsafe home environment.  Patient's caregiver went on vacation for several days and family at home have not been able to adequately take care of the patient.  Reportedly, patient's father who is advanced dementia.  Patient's brother with schizophrenia.  Patient is medically stable but has no safe disposition yet.  Patient's insurance and TOC working d/c.   Last note from [facility]: Angie with [facility] will come to the hospital to do an evaluation on the patient to see if she would be a good fit for her facility.  Hopefully she will be able to come out early this week.  Per charge RN the patient was evaluated by representative from a care home on 7/2.  Awaiting final decision regarding sepsis of patient.   Assessment & Plan:   Principal Problem:   Failure to thrive in adult Active Problems:   Rett syndrome   Seizure disorder (HCC)   Severe protein-calorie malnutrition (HCC)   Constipation   Vaginal candidiasis   Fecal impaction (HCC)   Depression  1 failure to thrive/Rett syndrome with cognitive disability/developmental delay/nonverbal with oral phase dysphagia at baseline-requires total care -Continue current dysphagia 1 diet with aspiration precautions.   -Supportive care.   -Needs a safe discharge plan which TOC is working on. -Per [employer] notes person from [place] who is scheduled to come and see the patient never showed up.  TOC working on another facility in [place].   -Per charge RN patient was evaluated by care home on 7/2 -Hopefully will have an update on their decision for acceptance early next week  2.  Fecal impaction/ileus -Patient had a NG tube placed which  patient removed. -Manual disimpaction ordered however per RN note unable to touch stool. -Patient with multiple loose watery stools and bowel movements over the past few days. -Patient with abdominal distention on 10/01/2020 which has improved over the past 24 hours.  Abdomen is soft.  Abdomen is nontender to palpation. -Abdominal films from 10/01/2020 with significant stomach distention. -Repeat abdominal films this morning with interim resolution of gastric distention, diffuse small and large bowel distention noted.  Findings consistent with adynamic ileus, no large stool burden noted. -Patient with bowel movements per RN. -Patient seen in consultation by general surgery who feel no surgical intervention at this time and recommending aggressive bowel regimen. Plan: Change bowel regimen to PRN As needed enemas and suppositories Monitor for BM Avoid constipation   3.  Unwitnessed fall in the hospital -Noted to have been found on her knees next of the bed on 09/14/2020 with no injuries noted -No mental status changes noted. -It is noted that patient's brother was notified, and noted that brother stated patient with tendency to wiggle her way out of bed. -Fall precautions.  4.  History of seizure disorder -Stable. -Continue Ativan, Trileptal, Gabitril.     5.  Depression/anxiety -Stable.   -Continue fluoxetine, Ativan.   6.  Candida vulvovaginitis -Status post nystatin.    7.  Tachycardia -Resolved.  8.  Hypernatremia -Likely secondary to dehydration secondary to multiple bowel movements and poor oral intake. -Urine sodium 37, urine creatinine 97.   -Improved on D5W. -Sodium level currently at 139.   -IV fluids have  been saline locked.   -Follow.  9.  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 -Patient with severe muscle depletion, severe fat depletion, percent weight loss 11.6% x 6 months. -Nutritional supplementation with Ensure discontinued due  to gaseous distention.   -Continue dysphagia 1 diet  -Dietitian following.     DVT prophylaxis: Lovenox Code Status: DNR Family Communication: None today Disposition Plan: Status is: Inpatient  Remains inpatient appropriate because:Unsafe d/c plan  Dispo: The patient is from: Home              Anticipated d/c is to:  TBD              Patient currently is medically stable to d/c.   Difficult to place patient Yes   Patient remains medically stable for discharge at this time.  Lacking safe disposition plan.  TOC aware and is looking for options.    Level of care: Med-Surg  Consultants:  General surgery, signed off  Procedures:  None  Antimicrobials:  None   Subjective: Seen and examined.  Resting comfortably.  Visibly no distress.  Objective: Vitals:   10/06/20 1746 10/06/20 2033 10/07/20 0542 10/07/20 0728  BP: (!) 107/36 (!) 90/55 90/68 (!) 92/54  Pulse: (!) 104 100 70 77  Resp: 15 16 16 18   Temp: 97.8 F (36.6 C) (!) 97.4 F (36.3 C) (!) 97 F (36.1 C) 97.8 F (36.6 C)  TempSrc:  Axillary    SpO2: 91% 99% 100% 97%  Weight:      Height:        Intake/Output Summary (Last 24 hours) at 10/07/2020 1021 Last data filed at 10/07/2020 1015 Gross per 24 hour  Intake 240 ml  Output --  Net 240 ml   Filed Weights   10/04/20 0530 10/05/20 0500 10/06/20 0500  Weight: 28.3 kg 32.9 kg 27.8 kg    Examination:  General exam: Visibly no distress.  Appears frail Respiratory system: Clear to auscultation. Respiratory effort normal. Cardiovascular system: S1 & S2 heard, RRR. No JVD, murmurs, rubs, gallops or clicks. No pedal edema. Gastrointestinal system: Thin/scaphoid.  Nontender.  Nondistended.  Normal bowel sounds Central nervous system: Opens eyes.  Oriented x0.  Nonverbal Extremities: Diffusely decreased power bilateral upper and lower extremities Skin: Pale, thin.  No obvious rashes or lesions Psychiatry: Judgement and insight appear impaired. Mood & affect  flattened.     Data Reviewed: I have personally reviewed following labs and imaging studies  CBC: Recent Labs  Lab 10/03/20 0440 10/06/20 0616  WBC 10.7* 4.0  HGB 10.7* 10.1*  HCT 33.2* 31.1*  MCV 90.7 88.9  PLT 139* 128*   Basic Metabolic Panel: Recent Labs  Lab 10/01/20 0444 10/02/20 0416 10/03/20 0440  NA 136 139 138  K 3.7 4.1 3.7  CL 110 108 107  CO2 23 27 25   GLUCOSE 83 86 80  BUN 15 11 11   CREATININE <0.30* 0.31* 0.33*  CALCIUM 7.9* 8.4* 8.3*  MG  --  2.0 2.0  PHOS  --  3.7  --    GFR: Estimated Creatinine Clearance: 38.6 mL/min (A) (by C-G formula based on SCr of 0.33 mg/dL (L)). Liver Function Tests: Recent Labs  Lab 10/02/20 0416  ALBUMIN 2.7*   No results for input(s): LIPASE, AMYLASE in the last 168 hours. No results for input(s): AMMONIA in the last 168 hours. Coagulation Profile: No results for input(s): INR, PROTIME in the last 168 hours. Cardiac Enzymes: No results for input(s): CKTOTAL, CKMB,  CKMBINDEX, TROPONINI in the last 168 hours. BNP (last 3 results) No results for input(s): PROBNP in the last 8760 hours. HbA1C: No results for input(s): HGBA1C in the last 72 hours. CBG: No results for input(s): GLUCAP in the last 168 hours. Lipid Profile: No results for input(s): CHOL, HDL, LDLCALC, TRIG, CHOLHDL, LDLDIRECT in the last 72 hours. Thyroid Function Tests: No results for input(s): TSH, T4TOTAL, FREET4, T3FREE, THYROIDAB in the last 72 hours. Anemia Panel: No results for input(s): VITAMINB12, FOLATE, FERRITIN, TIBC, IRON, RETICCTPCT in the last 72 hours. Sepsis Labs: No results for input(s): PROCALCITON, LATICACIDVEN in the last 168 hours.  No results found for this or any previous visit (from the past 240 hour(s)).       Radiology Studies: No results found.      Scheduled Meds:  baclofen  10 mg Oral TID   docusate  100 mg Oral BID   enoxaparin (LOVENOX) injection  30 mg Subcutaneous Q24H   feeding supplement  1  Container Oral TID BM   FLUoxetine  10 mg Oral Daily   LORazepam  1 mg Oral BID   nystatin cream   Topical BID   OXcarbazepine  600 mg Oral BID   pantoprazole sodium  40 mg Oral Daily   polyethylene glycol  17 g Oral Daily   simethicone  40 mg Oral QID   tiaGABine  4 mg Oral BID   Continuous Infusions:  promethazine (PHENERGAN) injection (IM or IVPB) 12.5 mg (09/23/20 2345)     LOS: 32 days    Time spent: 15 minutes    Tresa Moore, MD [facility] Pager [phone]  If 7PM-7AM, please contact night-coverage 10/07/2020, 10:21 AM

## 2020-10-07 NOTE — TOC Progression Note (Signed)
Transition of Care [facility]) - Progression Note    Patient Details  Name: Alicia Duncan MRN: [id number] Date of Birth: Jan 13, 1976  Transition of Care [facility]) CM/SW Contact  Liliana Cline, LCSW Phone Number: 10/07/2020, 8:56 AM  Clinical Narrative:   According to notes, care home was supposed to assess patient yesterday. CSW called and left VM for [name] requesting a return call with an update.    Expected Discharge Plan: Group Home Barriers to Discharge: Other (must enter comment) (patient needs a specialized care home placement)  Expected Discharge Plan and Services Expected Discharge Plan: Group Home   Discharge Planning Services: CM Consult Post Acute Care Choice: Nursing Home Living arrangements for the past 2 months: Single Family Home Expected Discharge Date: 09/13/20               DME Arranged: N/A DME Agency: NA         HH Agency: NA         Social Determinants of Health (SDOH) Interventions    Readmission Risk Interventions No flowsheet data found.

## 2020-10-08 DIAGNOSIS — R627 Adult failure to thrive: Secondary | ICD-10-CM | POA: Diagnosis not present

## 2020-10-08 NOTE — Progress Notes (Signed)
PROGRESS NOTE    Alicia Duncan  MVH:[id number] DOB: [birth year] DOA: 09/03/2020 PCP: [name], MD   Brief Narrative:  45 year old F with PMH of Rett syndrome, cognitive impairment, developmental delay, nonverbal, seizure disorder and debility with complete dependence for ADLs brought to ED by EMS due to unsafe home environment.  Patient's caregiver went on vacation for several days and family at home have not been able to adequately take care of the patient.  Reportedly, patient's father who is advanced dementia.  Patient's brother with schizophrenia.  Patient is medically stable but has no safe disposition yet.  Patient's insurance and TOC working d/c.   Last note from [facility]: Angie with [facility] will come to the hospital to do an evaluation on the patient to see if she would be a good fit for her facility.  Hopefully she will be able to come out early this week.  Per charge RN the patient was evaluated by representative from a care home on 7/2.  Awaiting final decision regarding acceptance of patient.   Assessment & Plan:   Principal Problem:   Failure to thrive in adult Active Problems:   Rett syndrome   Seizure disorder (HCC)   Severe protein-calorie malnutrition (HCC)   Constipation   Vaginal candidiasis   Fecal impaction (HCC)   Depression  1 failure to thrive/Rett syndrome with cognitive disability/developmental delay/nonverbal with oral phase dysphagia at baseline-requires total care -Continue current dysphagia 1 diet with aspiration precautions.   -Supportive care.   -Needs a safe discharge plan which TOC is working on. -Per [employer] notes person from [place] who is scheduled to come and see the patient never showed up.  TOC working on another facility in [place].   -Per charge RN patient was evaluated by care home on 7/2 Pending decision from care home  2.  Fecal impaction/ileus -Patient had a NG tube placed which patient removed. -Manual disimpaction ordered  however per RN note unable to touch stool. -Patient with multiple loose watery stools and bowel movements over the past few days. -Patient with abdominal distention on 10/01/2020 which has improved over the past 24 hours.  Abdomen is soft.  Abdomen is nontender to palpation. -Abdominal films from 10/01/2020 with significant stomach distention. -Repeat abdominal films this morning with interim resolution of gastric distention, diffuse small and large bowel distention noted.  Findings consistent with adynamic ileus, no large stool burden noted. -Patient with bowel movements per RN. -Patient seen in consultation by general surgery who feel no surgical intervention at this time and recommending aggressive bowel regimen. Plan: Patient has been having BMs.  Will continue current bowel regimen   3.  Unwitnessed fall in the hospital -Noted to have been found on her knees next of the bed on 09/14/2020 with no injuries noted -No mental status changes noted. -It is noted that patient's brother was notified, and noted that brother stated patient with tendency to wiggle her way out of bed. -Fall precautions.  4.  History of seizure disorder -Stable. -Continue Ativan, Trileptal, Gabitril.     5.  Depression/anxiety -Stable.   -Continue fluoxetine, Ativan.   6.  Candida vulvovaginitis -Status post nystatin.    7.  Tachycardia -Resolved.  8.  Hypernatremia -Likely secondary to dehydration secondary to multiple bowel movements and poor oral intake. -Urine sodium 37, urine creatinine 97.   -Improved on D5W. -Sodium level currently at 139.   -IV fluids have been saline locked.   -Follow.  9.  Severe protein calorie  malnutrition -Likely secondary to Rett syndrome. -Patient with severe muscle depletion, severe fat depletion, percent weight loss 11.6% x 6 months. -Nutritional supplementation with Ensure discontinued due to gaseous distention.   -Continue dysphagia 1 diet  -Dietitian following.      DVT prophylaxis: Lovenox Code Status: DNR Family Communication: None today Disposition Plan: Status is: Inpatient  Remains inpatient appropriate because:Unsafe d/c plan  Dispo: The patient is from: Home              Anticipated d/c is to:  TBD              Patient currently is medically stable to d/c.   Difficult to place patient Yes   Patient remains medically stable for discharge at this time.  Lacking safe disposition plan.  TOC aware and is looking for options.    Level of care: Med-Surg  Consultants:  General surgery, signed off  Procedures:  None  Antimicrobials:  None   Subjective: Seen and examined.  Resting comfortably.  Visibly no distress.  Objective: Vitals:   10/07/20 2019 10/07/20 2322 10/08/20 0429 10/08/20 0849  BP: 104/73 98/60 100/70 (!) 104/57  Pulse: 95 92 79 79  Resp: 16 16 16 15   Temp: 97.9 F (36.6 C) 98.2 F (36.8 C) (!) 97.5 F (36.4 C) 97.9 F (36.6 C)  TempSrc:      SpO2: 99% 98% 97% 96%  Weight:      Height:        Intake/Output Summary (Last 24 hours) at 10/08/2020 1040 Last data filed at 10/08/2020 1023 Gross per 24 hour  Intake 954 ml  Output --  Net 954 ml   Filed Weights   10/04/20 0530 10/05/20 0500 10/06/20 0500  Weight: 28.3 kg 32.9 kg 27.8 kg    Examination:  General exam: Visibly no distress.  Appears frail Respiratory system: Clear to auscultation. Respiratory effort normal. Cardiovascular system: S1 & S2 heard, RRR. No JVD, murmurs, rubs, gallops or clicks. No pedal edema. Gastrointestinal system: Thin/scaphoid.  Nontender.  Nondistended.  Normal bowel sounds Central nervous system: Opens eyes.  Oriented x0.  Nonverbal Extremities: Diffusely decreased power bilateral upper and lower extremities Skin: Pale, thin.  No obvious rashes or lesions Psychiatry: Judgement and insight appear impaired. Mood & affect flattened.     Data Reviewed: I have personally reviewed following labs and imaging  studies  CBC: Recent Labs  Lab 10/03/20 0440 10/06/20 0616  WBC 10.7* 4.0  HGB 10.7* 10.1*  HCT 33.2* 31.1*  MCV 90.7 88.9  PLT 139* 128*   Basic Metabolic Panel: Recent Labs  Lab 10/02/20 0416 10/03/20 0440  NA 139 138  K 4.1 3.7  CL 108 107  CO2 27 25  GLUCOSE 86 80  BUN 11 11  CREATININE 0.31* 0.33*  CALCIUM 8.4* 8.3*  MG 2.0 2.0  PHOS 3.7  --    GFR: Estimated Creatinine Clearance: 38.6 mL/min (A) (by C-G formula based on SCr of 0.33 mg/dL (L)). Liver Function Tests: Recent Labs  Lab 10/02/20 0416  ALBUMIN 2.7*   No results for input(s): LIPASE, AMYLASE in the last 168 hours. No results for input(s): AMMONIA in the last 168 hours. Coagulation Profile: No results for input(s): INR, PROTIME in the last 168 hours. Cardiac Enzymes: No results for input(s): CKTOTAL, CKMB, CKMBINDEX, TROPONINI in the last 168 hours. BNP (last 3 results) No results for input(s): PROBNP in the last 8760 hours. HbA1C: No results for input(s): HGBA1C in the last 72 hours.  CBG: No results for input(s): GLUCAP in the last 168 hours. Lipid Profile: No results for input(s): CHOL, HDL, LDLCALC, TRIG, CHOLHDL, LDLDIRECT in the last 72 hours. Thyroid Function Tests: No results for input(s): TSH, T4TOTAL, FREET4, T3FREE, THYROIDAB in the last 72 hours. Anemia Panel: No results for input(s): VITAMINB12, FOLATE, FERRITIN, TIBC, IRON, RETICCTPCT in the last 72 hours. Sepsis Labs: No results for input(s): PROCALCITON, LATICACIDVEN in the last 168 hours.  No results found for this or any previous visit (from the past 240 hour(s)).       Radiology Studies: No results found.      Scheduled Meds:  baclofen  10 mg Oral TID   docusate  100 mg Oral BID   enoxaparin (LOVENOX) injection  30 mg Subcutaneous Q24H   feeding supplement  1 Container Oral TID BM   FLUoxetine  10 mg Oral Daily   LORazepam  1 mg Oral BID   nystatin cream   Topical BID   OXcarbazepine  600 mg Oral BID    pantoprazole sodium  40 mg Oral Daily   polyethylene glycol  17 g Oral Daily   simethicone  40 mg Oral QID   tiaGABine  4 mg Oral BID   Continuous Infusions:  promethazine (PHENERGAN) injection (IM or IVPB) 12.5 mg (09/23/20 2345)     LOS: 33 days    Time spent: 15 minutes    Tresa Moore, MD [facility] Pager [phone]  If 7PM-7AM, please contact night-coverage 10/08/2020, 10:40 AM

## 2020-10-09 NOTE — Progress Notes (Signed)
Nutrition Follow-up  DOCUMENTATION CODES:  Severe malnutrition in context of chronic illness, Underweight  INTERVENTION:  Continue current diet as ordered per SLP Nursing to assist with meals Boost Breeze po TID, each supplement provides 250 kcal and 9 grams of protein.  NUTRITION DIAGNOSIS:  Severe Malnutrition related to chronic illness (Rett syndrome) as evidenced by severe muscle depletion, severe fat depletion, percent weight loss (11.6% x 6 months). - ongoing  GOAL:  Patient will meet greater than or equal to 90% of their needs, Weight gain - not meeting  MONITOR:  PO intake, Supplement acceptance, Labs, Weight trends  REASON FOR ASSESSMENT:  Other (Comment), Malnutrition Screening Tool (Low BMI, 13.48)    ASSESSMENT:  Pt brought to ED after concerns were raised over her care at home. Caregiver has been on vacation for several days and family at home have not been able to adequately take care of the patient. 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  Pt resting in bed at the time of visit. Awake but nonverbal. Discussed in rounds, CM still working on placement for pt. Clinically stable. Intake appears to be stable as well.    Average Meal Intake: 5/30-5/31: 50% intake x 1 recorded meal 6/1-6/7: 38% intake x 2 recorded meals 6/8-6/14: 19% intake x 8 recorded meals 6/15-6/19: 36% intake x 7 recorded meals 6/23-6/27: 50% intake x 6 recorded meals  Nutritionally Relevant Medications: Scheduled Meds:  docusate  100 mg Oral BID   feeding supplement  1 Container Oral TID BM   pantoprazole sodium  40 mg Oral Daily   polyethylene glycol  17 g Oral Daily   simethicone  40 mg Oral QID   Continuous Infusions:  promethazine (PHENERGAN) injection (IM or IVPB) 12.5 mg (09/23/20 2345)   PRN Meds: bisacodyl, ondansetron, promethazine   Labs Reviewed  Diet  Order:   Diet Order             DIET - DYS 1 Room service appropriate? Yes; Fluid consistency: Thin  Diet effective now           Diet - low sodium heart healthy                  EDUCATION NEEDS:  Not appropriate for education at this time  Skin:  Skin Assessment: Reviewed RN Assessment  Last BM:  7/4 - type 6  Height:  Ht Readings from Last 1 Encounters:  09/03/20 4\' 11"  (1.499 m)   Weight:  Wt Readings from Last 1 Encounters:  10/09/20 39.3 kg   Ideal Body Weight:  44.3 kg  BMI:  Body mass index is 17.5 kg/m.  Estimated Nutritional Needs:  Kcal:  1400-1600 kcal/d Protein:  70-85 g/d Fluid:  >1500 mL/d  12/10/20, RD, LDN Clinical Dietitian Pager on [employer]

## 2020-10-09 NOTE — Progress Notes (Signed)
PROGRESS NOTE    Alicia Duncan  URK:[id number] DOB: Jan 20, 1976 DOA: 09/03/2020 PCP: [name], MD   Brief Narrative:  45 year old F with PMH of Rett syndrome, cognitive impairment, developmental delay, nonverbal, seizure disorder and debility with complete dependence for ADLs brought to ED by EMS due to unsafe home environment.  Patient's caregiver went on vacation for several days and family at home have not been able to adequately take care of the patient.  Reportedly, patient's father who is advanced dementia.  Patient's brother with schizophrenia.  Patient is medically stable but has no safe disposition yet.  Patient's insurance and TOC working d/c.   Last note from [facility]: Angie with [facility] will come to the hospital to do an evaluation on the patient to see if she would be a good fit for her facility.  Hopefully she will be able to come out early this week.  Per charge RN the patient was evaluated by representative from a care home on 7/2.  Awaiting final decision regarding acceptance of patient.   Assessment & Plan:   Principal Problem:   Failure to thrive in adult Active Problems:   Rett syndrome   Seizure disorder (HCC)   Severe protein-calorie malnutrition (HCC)   Constipation   Vaginal candidiasis   Fecal impaction (HCC)   Depression  1 failure to thrive/Rett syndrome with cognitive disability/developmental delay/nonverbal with oral phase dysphagia at baseline-requires total care -Continue current dysphagia 1 diet with aspiration precautions.   -Supportive care.   -Needs a safe discharge plan which TOC is working on. -Per [employer] notes person from [place] who is scheduled to come and see the patient never showed up.  TOC working on another facility in [place].   -Per charge RN patient was evaluated by care home on 7/2 Pending decision from care home  2.  Fecal impaction/ileus -Patient had a NG tube placed which patient removed. -Manual disimpaction ordered  however per RN note unable to touch stool. -Patient with multiple loose watery stools and bowel movements over the past few days. -Patient with abdominal distention on 10/01/2020 which has improved over the past 24 hours.  Abdomen is soft.  Abdomen is nontender to palpation. -Abdominal films from 10/01/2020 with significant stomach distention. -Repeat abdominal films this morning with interim resolution of gastric distention, diffuse small and large bowel distention noted.  Findings consistent with adynamic ileus, no large stool burden noted. -Patient with bowel movements per RN. -Patient seen in consultation by general surgery who feel no surgical intervention at this time and recommending aggressive bowel regimen. Plan: Patient has been having BMs.  Will continue current bowel regimen   3.  Unwitnessed fall in the hospital -Noted to have been found on her knees next of the bed on 09/14/2020 with no injuries noted -No mental status changes noted. -It is noted that patient's brother was notified, and noted that brother stated patient with tendency to wiggle her way out of bed. -Fall precautions.  4.  History of seizure disorder -Stable. -Continue Ativan, Trileptal, Gabitril.     5.  Depression/anxiety -Stable.   -Continue fluoxetine, Ativan.   6.  Candida vulvovaginitis -Status post nystatin.    7.  Tachycardia -Resolved.  8.  Hypernatremia -Likely secondary to dehydration secondary to multiple bowel movements and poor oral intake. -Urine sodium 37, urine creatinine 97.   -Improved on D5W. -Sodium level currently at 139.   -IV fluids have been saline locked.   -Follow.  9.  Severe protein calorie  malnutrition -Likely secondary to Rett syndrome. -Patient with severe muscle depletion, severe fat depletion, percent weight loss 11.6% x 6 months. -Nutritional supplementation with Ensure discontinued due to gaseous distention.   -Continue dysphagia 1 diet  -Dietitian following.      DVT prophylaxis: Lovenox Code Status: DNR Family Communication: None today Disposition Plan: Status is: Inpatient  Remains inpatient appropriate because:Unsafe d/c plan  Dispo: The patient is from: Home              Anticipated d/c is to:  TBD              Patient currently is medically stable to d/c.   Difficult to place patient Yes   Patient remains medically stable for discharge at this time.  Lacking safe disposition plan.  TOC aware and is looking for options.    Level of care: Med-Surg  Consultants:  General surgery, signed off  Procedures:  None  Antimicrobials:  None   Subjective: Seen and examined.  Resting comfortably.  Visibly no distress.  Objective: Vitals:   10/09/20 0500 10/09/20 0527 10/09/20 0744 10/09/20 0938  BP:  111/70 (!) 99/56   Pulse:  79 81   Resp:  16 16   Temp:  (!) 96.9 F (36.1 C) (!) 97.3 F (36.3 C)   TempSrc:   Oral   SpO2:  100% 97%   Weight: 40.8 kg   40.5 kg  Height:        Intake/Output Summary (Last 24 hours) at 10/09/2020 0939 Last data filed at 10/08/2020 1828 Gross per 24 hour  Intake 0 ml  Output --  Net 0 ml   Filed Weights   10/06/20 0500 10/09/20 0500 10/09/20 0938  Weight: 27.8 kg 40.8 kg 40.5 kg    Examination:  General exam: Visibly no distress.  Appears frail and very underweight Respiratory system: Clear to auscultation. Respiratory effort normal. Cardiovascular system: S1 & S2 heard, RRR. No JVD, murmurs, rubs, gallops or clicks. No pedal edema. Gastrointestinal system: Thin/scaphoid.  Nontender.  Nondistended.  Normal bowel sounds Central nervous system: Opens eyes.  Oriented x0.  Nonverbal Extremities: Diffusely decreased power bilateral upper and lower extremities Skin: Pale, thin.  No obvious rashes or lesions Psychiatry: Judgement and insight appear impaired. Mood & affect flattened.     Data Reviewed: I have personally reviewed following labs and imaging studies  CBC: Recent Labs  Lab  10/03/20 0440 10/06/20 0616  WBC 10.7* 4.0  HGB 10.7* 10.1*  HCT 33.2* 31.1*  MCV 90.7 88.9  PLT 139* 128*   Basic Metabolic Panel: Recent Labs  Lab 10/03/20 0440  NA 138  K 3.7  CL 107  CO2 25  GLUCOSE 80  BUN 11  CREATININE 0.33*  CALCIUM 8.3*  MG 2.0   GFR: Estimated Creatinine Clearance: 56.2 mL/min (A) (by C-G formula based on SCr of 0.33 mg/dL (L)). Liver Function Tests: No results for input(s): AST, ALT, ALKPHOS, BILITOT, PROT, ALBUMIN in the last 168 hours.  No results for input(s): LIPASE, AMYLASE in the last 168 hours. No results for input(s): AMMONIA in the last 168 hours. Coagulation Profile: No results for input(s): INR, PROTIME in the last 168 hours. Cardiac Enzymes: No results for input(s): CKTOTAL, CKMB, CKMBINDEX, TROPONINI in the last 168 hours. BNP (last 3 results) No results for input(s): PROBNP in the last 8760 hours. HbA1C: No results for input(s): HGBA1C in the last 72 hours. CBG: No results for input(s): GLUCAP in the last 168 hours. Lipid  Profile: No results for input(s): CHOL, HDL, LDLCALC, TRIG, CHOLHDL, LDLDIRECT in the last 72 hours. Thyroid Function Tests: No results for input(s): TSH, T4TOTAL, FREET4, T3FREE, THYROIDAB in the last 72 hours. Anemia Panel: No results for input(s): VITAMINB12, FOLATE, FERRITIN, TIBC, IRON, RETICCTPCT in the last 72 hours. Sepsis Labs: No results for input(s): PROCALCITON, LATICACIDVEN in the last 168 hours.  No results found for this or any previous visit (from the past 240 hour(s)).       Radiology Studies: No results found.      Scheduled Meds:  baclofen  10 mg Oral TID   docusate  100 mg Oral BID   enoxaparin (LOVENOX) injection  30 mg Subcutaneous Q24H   feeding supplement  1 Container Oral TID BM   FLUoxetine  10 mg Oral Daily   LORazepam  1 mg Oral BID   nystatin cream   Topical BID   OXcarbazepine  600 mg Oral BID   pantoprazole sodium  40 mg Oral Daily   polyethylene glycol   17 g Oral Daily   simethicone  40 mg Oral QID   tiaGABine  4 mg Oral BID   Continuous Infusions:  promethazine (PHENERGAN) injection (IM or IVPB) 12.5 mg (09/23/20 2345)     LOS: 34 days    Time spent: 15 minutes    Tresa Moore, MD [facility] Pager [phone]  If 7PM-7AM, please contact night-coverage 10/09/2020, 9:39 AM

## 2020-10-09 NOTE — TOC Progression Note (Signed)
Transition of Care [facility]) - Progression Note    Patient Details  Name: Alicia Duncan MRN: [id number] Date of Birth: [birth year]  [facility] CM/SW Contact  Allayne Butcher, RN Phone Number: 10/09/2020, 3:57 PM  Clinical Narrative:    Care home in [place] has been found and they would like to accept [name].  [name] with [facility] reports that the next step is for [facility] to inspect the care home- if it passes inspection then patient will be able to discharge to the home.  Unsure of how long it will take for the inspection to take place.     Expected Discharge Plan: Group Home Barriers to Discharge: Other (must enter comment) (patient needs a specialized care home placement)  Expected Discharge Plan and Services Expected Discharge Plan: Group Home   Discharge Planning Services: CM Consult Post Acute Care Choice: Nursing Home Living arrangements for the past 2 months: Single Family Home Expected Discharge Date: 09/13/20               DME Arranged: N/A DME Agency: NA         HH Agency: NA         Social Determinants of Health (SDOH) Interventions    Readmission Risk Interventions No flowsheet data found.

## 2020-10-10 DIAGNOSIS — R627 Adult failure to thrive: Secondary | ICD-10-CM | POA: Diagnosis not present

## 2020-10-10 LAB — CBC WITH DIFFERENTIAL/PLATELET
Abs Immature Granulocytes: 0.03 10*3/uL (ref 0.00–0.07)
Basophils Absolute: 0 10*3/uL (ref 0.0–0.1)
Basophils Relative: 1 %
Eosinophils Absolute: 0.2 10*3/uL (ref 0.0–0.5)
Eosinophils Relative: 3 %
HCT: 33.5 % — ABNORMAL LOW (ref 36.0–46.0)
Hemoglobin: 10.6 g/dL — ABNORMAL LOW (ref 12.0–15.0)
Immature Granulocytes: 1 %
Lymphocytes Relative: 38 %
Lymphs Abs: 2.2 10*3/uL (ref 0.7–4.0)
MCH: 29 pg (ref 26.0–34.0)
MCHC: 31.6 g/dL (ref 30.0–36.0)
MCV: 91.5 fL (ref 80.0–100.0)
Monocytes Absolute: 0.5 10*3/uL (ref 0.1–1.0)
Monocytes Relative: 8 %
Neutro Abs: 2.8 10*3/uL (ref 1.7–7.7)
Neutrophils Relative %: 49 %
Platelets: 162 10*3/uL (ref 150–400)
RBC: 3.66 MIL/uL — ABNORMAL LOW (ref 3.87–5.11)
RDW: 15.2 % (ref 11.5–15.5)
WBC: 5.6 10*3/uL (ref 4.0–10.5)
nRBC: 0 % (ref 0.0–0.2)

## 2020-10-10 LAB — BASIC METABOLIC PANEL
Anion gap: 5 (ref 5–15)
BUN: 18 mg/dL (ref 6–20)
CO2: 26 mmol/L (ref 22–32)
Calcium: 8.3 mg/dL — ABNORMAL LOW (ref 8.9–10.3)
Chloride: 107 mmol/L (ref 98–111)
Creatinine, Ser: 0.31 mg/dL — ABNORMAL LOW (ref 0.44–1.00)
GFR, Estimated: >60 mL/min (ref >60)
Glucose, Bld: 73 mg/dL (ref 70–99)
Potassium: 3.6 mmol/L (ref 3.5–5.1)
Sodium: 138 mmol/L (ref 135–145)

## 2020-10-10 LAB — IRON AND TIBC
Iron: 27 ug/dL — ABNORMAL LOW (ref 28–170)
Saturation Ratios: 6 % — ABNORMAL LOW (ref 10.4–31.8)
TIBC: 423 ug/dL (ref 250–450)
UIBC: 396 ug/dL

## 2020-10-10 LAB — VITAMIN B12: Vitamin B-12: 673 pg/mL (ref 180–914)

## 2020-10-10 LAB — VITAMIN D 25 HYDROXY (VIT D DEFICIENCY, FRACTURES): Vit D, 25-Hydroxy: 28.62 ng/mL — ABNORMAL LOW (ref 30–100)

## 2020-10-10 LAB — FOLATE: Folate: 24 ng/mL (ref >5.9)

## 2020-10-10 LAB — MAGNESIUM: Magnesium: 1.8 mg/dL (ref 1.7–2.4)

## 2020-10-10 MED ORDER — SODIUM CHLORIDE 0.9 % IV SOLN
200.0000 mg | INTRAVENOUS | Status: AC
  Administered 2020-10-10 – 2020-10-14 (×5): 200 mg via INTRAVENOUS
  Filled 2020-10-10 (×5): qty 10

## 2020-10-10 NOTE — Progress Notes (Signed)
[facility] Progress Note  Patient: Alicia Duncan    TKZ:[id number]  DOA: 09/03/2020     Date of Service: the patient was seen and examined on 10/10/2020  Chief Complaint  Patient presents with   social work   Brief hospital course: 45 year old F with PMH of Rett syndrome, cognitive impairment, developmental delay, nonverbal, seizure disorder and debility with complete dependence for ADLs brought to ED by EMS due to unsafe home environment.  Patient's caregiver went on vacation for several days and family at home have not been able to adequately take care of the patient.  Reportedly, patient's father who is advanced dementia.  Patient's brother with schizophrenia.  Patient is medically stable but has no safe disposition yet.  Patient's insurance and TOC working d/c.   Last note from [facility]: Angie with [facility] will come to the hospital to do an evaluation on the patient to see if she would be a good fit for her facility.  Hopefully she will be able to come out early this week.   Per charge RN the patient was evaluated by representative from a care home on 7/2.  Awaiting final decision regarding acceptance of patient.  Assessment and Plan: Principal Problem:   Failure to thrive in adult Active Problems:   Rett syndrome   Seizure disorder (HCC)   Severe protein-calorie malnutrition (HCC)   Constipation   Vaginal candidiasis   Fecal impaction (HCC)   Depression   # failure to thrive/Rett syndrome with cognitive disability/developmental delay/nonverbal with oral phase dysphagia at baseline-requires total care -Continue current dysphagia 1 diet with aspiration precautions.   -Supportive care.   -Needs a safe discharge plan which TOC is working on. -Per [employer] notes person from [place] who is scheduled to come and see the patient never showed up.  TOC working on another facility in [place].   -Per charge RN patient was evaluated by care home on 7/2 Pending decision from care  home  #  Fecal impaction/ileus -Patient had a NG tube placed which patient removed. -Manual disimpaction ordered however per RN note unable to touch stool. -Patient with multiple loose watery stools and bowel movements over the past few days. -Patient with abdominal distention on 10/01/2020 which has improved over the past 24 hours.  Abdomen is soft.  Abdomen is nontender to palpation. -Abdominal films from 10/01/2020 with significant stomach distention. -Repeat abdominal films this morning with interim resolution of gastric distention, diffuse small and large bowel distention noted.  Findings consistent with adynamic ileus, no large stool burden noted. -Patient with bowel movements per RN. -Patient seen in consultation by general surgery who feel no surgical intervention at this time and recommending aggressive bowel regimen. Plan: Patient has been having BMs.  Will continue current bowel regimen   #  Unwitnessed fall in the hospital -Noted to have been found on her knees next of the bed on 09/14/2020 with no injuries noted -No mental status changes noted. -It is noted that patient's brother was notified, and noted that brother stated patient with tendency to wiggle her way out of bed. -Fall precautions.  #  History of seizure disorder -Stable. -Continue Ativan, Trileptal, Gabitril.     #  Depression/anxiety -Stable.   -Continue fluoxetine, Ativan.   #  Candida vulvovaginitis -Status post nystatin.    #  Tachycardia -Resolved.  #  Hypernatremia -Likely secondary to dehydration secondary to multiple bowel movements and poor oral intake. -Urine sodium 37, urine creatinine 97.   -Improved on  D5W. -Sodium level currently at 139.   -IV fluids have been saline locked.   -Follow.  #Iron deficiency, started Venofer 200 mg IV daily for 5 days, start oral supplement at discharge.  #  Severe protein calorie malnutrition -Likely secondary to Rett syndrome. -Patient with severe muscle  depletion, severe fat depletion, percent weight loss 11.6% x 6 months. -Nutritional supplementation with Ensure discontinued due to gaseous distention.   -Continue dysphagia 1 diet  -Dietitian following.      Body mass index is 17.5 kg/m.  Nutrition Problem: Severe Malnutrition Etiology: chronic illness (Rett syndrome) Interventions: Interventions: Boost Breeze      Diet: Dysphagia 1 diet DVT Prophylaxis: Subcutaneous Lovenox   Advance goals of care discussion: DNR  Family Communication: family was Not present at bedside, at the time of interview.    Disposition:  Pt is from Home, admitted with unsafe home environment, still has no place to go, which precludes a safe discharge. Discharge to Care home in [place], which need inspection and then patient can be discharged. Difficult to place patient Yes  Subjective: No overnight issues, laying comfortably, unable to offer any complaints.  Physical Exam: General:  alert not oriented to time, place, and person.  Appear in no distress Eyes: PERRLA ENT: Oral Mucosa Clear, moist  Neck: no JVD,  Cardiovascular: S1 and S2 Present, no Murmur,  Respiratory: good respiratory effort, Bilateral Air entry equal and Decreased, no Crackles, no wheezes Abdomen: Bowel Sound present, Soft and no tenderness,  Skin: no rashes Extremities: no Pedal edema, no calf tenderness Neurologic: without any new focal findings Gait not checked due to patient safety concerns  Vitals:   10/10/20 0044 10/10/20 0400 10/10/20 0716 10/10/20 1120  BP: (!) 107/54 96/68 (!) 102/55 133/79  Pulse: 82 75 63 90  Resp: 16 16 16 18   Temp: 97.6 F (36.4 C) (!) 97.5 F (36.4 C) 97.6 F (36.4 C) 97.9 F (36.6 C)  TempSrc:   Oral   SpO2: 96% 97% 99% 100%  Weight:      Height:        Intake/Output Summary (Last 24 hours) at 10/10/2020 1342 Last data filed at 10/10/2020 1005 Gross per 24 hour  Intake 480 ml  Output --  Net 480 ml   Filed Weights   10/06/20 0500  10/09/20 0500 10/09/20 0938  Weight: 27.8 kg 40.8 kg 39.3 kg    Data Reviewed: I have personally reviewed and interpreted daily labs, tele strips, imagings as discussed above. I reviewed all nursing notes, pharmacy notes, vitals, pertinent old records I have discussed plan of care as described above with RN and patient/family.  CBC: Recent Labs  Lab 10/06/20 0616 10/10/20 0417  WBC 4.0 5.6  NEUTROABS  --  2.8  HGB 10.1* 10.6*  HCT 31.1* 33.5*  MCV 88.9 91.5  PLT 128* 162   Basic Metabolic Panel: Recent Labs  Lab 10/10/20 0417  NA 138  K 3.6  CL 107  CO2 26  GLUCOSE 73  BUN 18  CREATININE 0.31*  CALCIUM 8.3*  MG 1.8    Studies: No results found.  Scheduled Meds:  baclofen  10 mg Oral TID   docusate  100 mg Oral BID   enoxaparin (LOVENOX) injection  30 mg Subcutaneous Q24H   feeding supplement  1 Container Oral TID BM   FLUoxetine  10 mg Oral Daily   LORazepam  1 mg Oral BID   nystatin cream   Topical BID   OXcarbazepine  600 mg Oral BID   pantoprazole sodium  40 mg Oral Daily   polyethylene glycol  17 g Oral Daily   simethicone  40 mg Oral QID   tiaGABine  4 mg Oral BID   Continuous Infusions:  promethazine (PHENERGAN) injection (IM or IVPB) 12.5 mg (09/23/20 2345)   PRN Meds: acetaminophen **OR** acetaminophen, bisacodyl, ondansetron **OR** ondansetron (ZOFRAN) IV, promethazine (PHENERGAN) injection (IM or IVPB)  Time spent: 35 minutes  Author: Gillis Santa. MD [facility] 10/10/2020 1:42 PM  To reach On-call, see care teams to locate the attending and reach out to them via [email or web address]. If 7PM-7AM, please contact night-coverage If you still have difficulty reaching the attending provider, please page the [facility] (Director on Call) for [facility] on amion for assistance.

## 2020-10-10 NOTE — Plan of Care (Signed)
Continue to await disposition.  Pt took HS meds without difficulty in [place]ooze Breeze orange flavored.  Total care provided.  Hilton Sinclair BSN RN CMSRN    Problem: Nutrition: Goal: Adequate nutrition will be maintained Outcome: Progressing

## 2020-10-11 DIAGNOSIS — R627 Adult failure to thrive: Secondary | ICD-10-CM | POA: Diagnosis not present

## 2020-10-11 MED ORDER — VITAMIN D (ERGOCALCIFEROL) 1.25 MG (50000 UNIT) PO CAPS
50000.0000 [IU] | ORAL_CAPSULE | ORAL | Status: DC
  Administered 2020-10-11 – 2020-11-01 (×4): 50000 [IU] via ORAL
  Filled 2020-10-11 (×4): qty 1

## 2020-10-11 NOTE — Plan of Care (Signed)
Pt nonverbal, able to follow few simple commands. No acute distress noted. Incontinence care provided PRN, no BM this shift but continues to pass gas frequently. Noted blister to lateral right ankle, unclear how long present, vaseline gauze and foam placed over to prevent friction. Turns self in bed, fall/safety/seizure precautions in place, rounding performed.   Problem: Education: Goal: Knowledge of General Education information will improve Description: Including pain rating scale, medication(s)/side effects and non-pharmacologic comfort measures Outcome: Not Progressing   Problem: Health Behavior/Discharge Planning: Goal: Ability to manage health-related needs will improve Outcome: Not Progressing   Problem: Clinical Measurements: Goal: Ability to maintain clinical measurements within normal limits will improve Outcome: Not Progressing Goal: Will remain free from infection Outcome: Progressing Goal: Diagnostic test results will improve Outcome: Progressing Goal: Respiratory complications will improve Outcome: Not Applicable Goal: Cardiovascular complication will be avoided Outcome: Not Applicable   Problem: Activity: Goal: Risk for activity intolerance will decrease Outcome: Progressing   Problem: Nutrition: Goal: Adequate nutrition will be maintained Outcome: Progressing   Problem: Coping: Goal: Level of anxiety will decrease Outcome: Progressing   Problem: Elimination: Goal: Will not experience complications related to bowel motility Outcome: Progressing Goal: Will not experience complications related to urinary retention Outcome: Progressing   Problem: Pain Managment: Goal: General experience of comfort will improve Outcome: Progressing   Problem: Safety: Goal: Ability to remain free from injury will improve Outcome: Progressing   Problem: Skin Integrity: Goal: Risk for impaired skin integrity will decrease Outcome: Progressing

## 2020-10-11 NOTE — Progress Notes (Signed)
[facility] Progress Note  Patient: Alicia Duncan    HYQ:[id number]  DOA: 09/03/2020     Date of Service: the patient was seen and examined on 10/11/2020  Chief Complaint  Patient presents with   social work   Brief hospital course: 45 year old F with PMH of Rett syndrome, cognitive impairment, developmental delay, nonverbal, seizure disorder and debility with complete dependence for ADLs brought to ED by EMS due to unsafe home environment.  Patient's caregiver went on vacation for several days and family at home have not been able to adequately take care of the patient.  Reportedly, patient's father who is advanced dementia.  Patient's brother with schizophrenia.  Patient is medically stable but has no safe disposition yet.  Patient's insurance and TOC working d/c.   Last note from [facility]: Angie with [facility] will come to the hospital to do an evaluation on the patient to see if she would be a good fit for her facility.  Hopefully she will be able to come out early this week.   Per charge RN the patient was evaluated by representative from a care home on 7/2.  Awaiting final decision regarding acceptance of patient.  Assessment and Plan: Principal Problem:   Failure to thrive in adult Active Problems:   Rett syndrome   Seizure disorder (HCC)   Severe protein-calorie malnutrition (HCC)   Constipation   Vaginal candidiasis   Fecal impaction (HCC)   Depression   # failure to thrive/Rett syndrome with cognitive disability/developmental delay/nonverbal with oral phase dysphagia at baseline-requires total care -Continue current dysphagia 1 diet with aspiration precautions.   -Supportive care.   -Needs a safe discharge plan which TOC is working on. -Per [employer] notes person from [place] who is scheduled to come and see the patient never showed up.  TOC working on another facility in [place].   -Per charge RN patient was evaluated by care home on 7/2 Pending decision from care  home  #  Fecal impaction/ileus -Patient had a NG tube placed which patient removed. -Manual disimpaction ordered however per RN note unable to touch stool. -Patient with multiple loose watery stools and bowel movements over the past few days. -Patient with abdominal distention on 10/01/2020 which has improved over the past 24 hours.  Abdomen is soft.  Abdomen is nontender to palpation. -Abdominal films from 10/01/2020 with significant stomach distention. -Repeat abdominal films this morning with interim resolution of gastric distention, diffuse small and large bowel distention noted.  Findings consistent with adynamic ileus, no large stool burden noted. -Patient with bowel movements per RN. -Patient seen in consultation by general surgery who feel no surgical intervention at this time and recommending aggressive bowel regimen. Plan: Patient has been having BMs.  Will continue current bowel regimen   #  Unwitnessed fall in the hospital -Noted to have been found on her knees next of the bed on 09/14/2020 with no injuries noted -No mental status changes noted. -It is noted that patient's brother was notified, and noted that brother stated patient with tendency to wiggle her way out of bed. -Fall precautions.  #  History of seizure disorder -Stable. -Continue Ativan, Trileptal, Gabitril.     #  Depression/anxiety -Stable.   -Continue fluoxetine, Ativan.   #  Candida vulvovaginitis -Status post nystatin.    #  Tachycardia -Resolved.  #  Hypernatremia -Likely secondary to dehydration secondary to multiple bowel movements and poor oral intake. -Urine sodium 37, urine creatinine 97.   -Improved on  D5W. -Sodium level currently at 139.   -IV fluids have been saline locked.   -Follow.  #Iron deficiency, started Venofer 200 mg IV daily for 5 days, start oral supplement at discharge. #Vitamin D insufficiency, started oral supplement.  #  Severe protein calorie malnutrition -Likely  secondary to Rett syndrome. -Patient with severe muscle depletion, severe fat depletion, percent weight loss 11.6% x 6 months. -Nutritional supplementation with Ensure discontinued due to gaseous distention.   -Continue dysphagia 1 diet  -Dietitian following.      Body mass index is 17.5 kg/m.  Nutrition Problem: Severe Malnutrition Etiology: chronic illness (Rett syndrome) Interventions: Interventions: Boost Breeze      Diet: Dysphagia 1 diet DVT Prophylaxis: Subcutaneous Lovenox   Advance goals of care discussion: DNR  Family Communication: family was Not present at bedside, at the time of interview.    Disposition:  Pt is from Home, admitted with unsafe home environment, still has no place to go, which precludes a safe discharge. Discharge to Care home in [place], which need inspection and then patient can be discharged. Difficult to place patient Yes  Subjective: No overnight issues, laying comfortably, unable to offer any complaints.  Physical Exam: General:  alert not oriented to time, place, and person.  Appear in no distress Eyes: PERRLA ENT: Oral Mucosa Clear, moist  Neck: no JVD,  Cardiovascular: S1 and S2 Present, no Murmur,  Respiratory: good respiratory effort, Bilateral Air entry equal and Decreased, no Crackles, no wheezes Abdomen: Bowel Sound present, Soft and no tenderness,  Skin: no rashes Extremities: no Pedal edema, no calf tenderness Neurologic: without any new focal findings Gait not checked due to patient safety concerns  Vitals:   10/10/20 2304 10/11/20 0527 10/11/20 0804 10/11/20 1151  BP: 95/73 103/78 107/75 97/78  Pulse: 74 72 88 83  Resp: 15 16 20 18   Temp: 98.5 F (36.9 C) (!) 97.2 F (36.2 C) 97.7 F (36.5 C) 98.9 F (37.2 C)  TempSrc: Axillary     SpO2: 98% 100% 100% 100%  Weight:      Height:        Intake/Output Summary (Last 24 hours) at 10/11/2020 1411 Last data filed at 10/11/2020 1022 Gross per 24 hour  Intake 483.85 ml   Output --  Net 483.85 ml   Filed Weights   10/06/20 0500 10/09/20 0500 10/09/20 0938  Weight: 27.8 kg 40.8 kg 39.3 kg    Data Reviewed: I have personally reviewed and interpreted daily labs, tele strips, imagings as discussed above. I reviewed all nursing notes, pharmacy notes, vitals, pertinent old records I have discussed plan of care as described above with RN and patient/family.  CBC: Recent Labs  Lab 10/06/20 0616 10/10/20 0417  WBC 4.0 5.6  NEUTROABS  --  2.8  HGB 10.1* 10.6*  HCT 31.1* 33.5*  MCV 88.9 91.5  PLT 128* 162   Basic Metabolic Panel: Recent Labs  Lab 10/10/20 0417  NA 138  K 3.6  CL 107  CO2 26  GLUCOSE 73  BUN 18  CREATININE 0.31*  CALCIUM 8.3*  MG 1.8    Studies: No results found.  Scheduled Meds:  baclofen  10 mg Oral TID   docusate  100 mg Oral BID   enoxaparin (LOVENOX) injection  30 mg Subcutaneous Q24H   feeding supplement  1 Container Oral TID BM   FLUoxetine  10 mg Oral Daily   LORazepam  1 mg Oral BID   nystatin cream   Topical BID  OXcarbazepine  600 mg Oral BID   pantoprazole sodium  40 mg Oral Daily   polyethylene glycol  17 g Oral Daily   simethicone  40 mg Oral QID   tiaGABine  4 mg Oral BID   Vitamin D (Ergocalciferol)  50,000 Units Oral Q7 days   Continuous Infusions:  iron sucrose Stopped (10/10/20 2013)   promethazine (PHENERGAN) injection (IM or IVPB) Stopped (10/10/20 2013)   PRN Meds: acetaminophen **OR** acetaminophen, bisacodyl, ondansetron **OR** ondansetron (ZOFRAN) IV, promethazine (PHENERGAN) injection (IM or IVPB)  Time spent: 35 minutes  Author: Gillis Santa. MD [facility] 10/11/2020 2:11 PM  To reach On-call, see care teams to locate the attending and reach out to them via [email or web address]. If 7PM-7AM, please contact night-coverage If you still have difficulty reaching the attending provider, please page the [facility] (Director on Call) for [facility] on amion for assistance.

## 2020-10-12 DIAGNOSIS — R627 Adult failure to thrive: Secondary | ICD-10-CM | POA: Diagnosis not present

## 2020-10-12 MED ORDER — ENSURE ENLIVE PO LIQD
1.0000 | Freq: Three times a day (TID) | ORAL | Status: DC
  Administered 2020-10-12 – 2020-11-01 (×55): 237 mL via ORAL

## 2020-10-12 NOTE — Progress Notes (Signed)
Pt exhibited seizure like activity, with her body drawn into itself and shaking. Lasted roughly 45 seconds. She dose not appear to be post-ictal. Very alert and drinking her bottle at this time. MD made aware.

## 2020-10-12 NOTE — TOC Progression Note (Signed)
Transition of Care [facility]) - Progression Note    Patient Details  Name: [name] MRN: [id number] Date of Birth: 1975/11/27  [facility]) CM/SW Contact  Allayne Butcher, RN Phone Number: 10/12/2020, 3:18 PM  Clinical Narrative:    [name] reports that it should not be much longer until the placement is approved, hopefully sometime next week.  TOC will follow up with [name] next week.    Expected Discharge Plan: Group Home Barriers to Discharge: Other (must enter comment) (patient needs a specialized care home placement)  Expected Discharge Plan and Services Expected Discharge Plan: Group Home   Discharge Planning Services: CM Consult Post Acute Care Choice: Nursing Home Living arrangements for the past 2 months: Single Family Home Expected Discharge Date: 09/13/20               DME Arranged: N/A DME Agency: NA         HH Agency: NA         Social Determinants of Health (SDOH) Interventions    Readmission Risk Interventions No flowsheet data found.

## 2020-10-12 NOTE — Progress Notes (Signed)
[facility] Progress Note  Patient: Alicia Duncan    GOT:[id number]  DOA: 09/03/2020     Date of Service: the patient was seen and examined on 10/12/2020  Chief Complaint  Patient presents with   social work   Brief hospital course: 45 year old F with PMH of Rett syndrome, cognitive impairment, developmental delay, nonverbal, seizure disorder and debility with complete dependence for ADLs brought to ED by EMS due to unsafe home environment.  Patient's caregiver went on vacation for several days and family at home have not been able to adequately take care of the patient.  Reportedly, patient's father who is advanced dementia.  Patient's brother with schizophrenia.  Patient is medically stable but has no safe disposition yet.  Patient's insurance and TOC working d/c.   Last note from [facility]: Angie with [employer] will come to the hospital to do an evaluation on the patient to see if she would be a good fit for her facility.  Hopefully she will be able to come out early this week.   Per charge RN the patient was evaluated by representative from a care home on 7/2.  Awaiting final decision regarding acceptance of patient.  Assessment and Plan: Principal Problem:   Failure to thrive in adult Active Problems:   Rett syndrome   Seizure disorder (HCC)   Severe protein-calorie malnutrition (HCC)   Constipation   Vaginal candidiasis   Fecal impaction (HCC)   Depression   # failure to thrive/Rett syndrome with cognitive disability/developmental delay/nonverbal with oral phase dysphagia at baseline-requires total care -Continue current dysphagia 1 diet with aspiration precautions.   -Supportive care.   -Needs a safe discharge plan which TOC is working on. -Per [employer] notes person from [place] who is scheduled to come and see the patient never showed up.  TOC working on another facility in [place] [employer].   -Per charge RN patient was evaluated by care home on 7/2 Pending decision from care  home  #  Fecal impaction/ileus -Patient had a NG tube placed which patient removed. -Manual disimpaction ordered however per RN note unable to touch stool. -Patient with multiple loose watery stools and bowel movements over the past few days. -Patient with abdominal distention on 10/01/2020 which has improved over the past 24 hours.  Abdomen is soft.  Abdomen is nontender to palpation. -Abdominal films from 10/01/2020 with significant stomach distention. -Repeat abdominal films this morning with interim resolution of gastric distention, diffuse small and large bowel distention noted.  Findings consistent with adynamic ileus, no large stool burden noted. -Patient with bowel movements per RN. -Patient seen in consultation by general surgery who feel no surgical intervention at this time and recommending aggressive bowel regimen. Plan: Patient has been having BMs.  Will continue current bowel regimen   #  Unwitnessed fall in the hospital -Noted to have been found on her knees next of the bed on 09/14/2020 with no injuries noted -No mental status changes noted. -It is noted that patient's brother was notified, and noted that brother stated patient with tendency to wiggle her way out of bed. -Fall precautions.  #  History of seizure disorder 7/8 seizure-like activity lasted 45 seconds as per RN, no postictal symptoms -Stable. -Continue Ativan, Trileptal, Gabitril.     #  Depression/anxiety -Stable.   -Continue fluoxetine, Ativan.   #  Candida vulvovaginitis -Status post nystatin.    #  Tachycardia -Resolved.  #  Hypernatremia -Likely secondary to dehydration secondary to multiple bowel movements and poor  oral intake. -Urine sodium 37, urine creatinine 97.   -Improved on D5W. -Sodium level currently at 139.   -IV fluids have been saline locked.   -Follow.  #Iron deficiency, started Venofer 200 mg IV daily for 5 days, start oral supplement at discharge. #Vitamin D insufficiency,  started oral supplement.  #  Severe protein calorie malnutrition -Likely secondary to Rett syndrome. -Patient with severe muscle depletion, severe fat depletion, percent weight loss 11.6% x 6 months. -Nutritional supplementation with Ensure discontinued due to gaseous distention.   -Continue dysphagia 1 diet  -Dietitian following.      Body mass index is 17.5 kg/m.  Nutrition Problem: Severe Malnutrition Etiology: chronic illness (Rett syndrome) Interventions: Interventions: Boost Breeze      Diet: Dysphagia 1 diet DVT Prophylaxis: Subcutaneous Lovenox   Advance goals of care discussion: DNR  Family Communication: family was Not present at bedside, at the time of interview.    Disposition:  Pt is from Home, admitted with unsafe home environment, still has no place to go, which precludes a safe discharge. Discharge to Care home in [place], which need inspection and then patient can be discharged. Difficult to place patient Yes  Subjective: No overnight issues, laying comfortably, unable to offer any complaints. As per RN patient had seizure activity 45 seconds, no postictal symptoms.    Physical Exam: General:  alert not oriented to time, place, and person.  Appear in no distress Eyes: PERRLA ENT: Oral Mucosa Clear, moist  Neck: no JVD,  Cardiovascular: S1 and S2 Present, no Murmur,  Respiratory: good respiratory effort, Bilateral Air entry equal and Decreased, no Crackles, no wheezes Abdomen: Bowel Sound present, Soft and no tenderness,  Skin: no rashes Extremities: no Pedal edema, no calf tenderness Neurologic: without any new focal findings Gait not checked due to patient safety concerns  Vitals:   10/11/20 1643 10/11/20 2155 10/12/20 0331 10/12/20 0919  BP: 102/90 103/76 (!) 92/51 123/77  Pulse: 82 71 (!) 57 85  Resp: 18 16 16 16   Temp: 98.4 F (36.9 C) 97.7 F (36.5 C) (!) 97.5 F (36.4 C) 97.9 F (36.6 C)  TempSrc:  Oral  Oral  SpO2: 93% 99% 98% 100%   Weight:      Height:        Intake/Output Summary (Last 24 hours) at 10/12/2020 1331 Last data filed at 10/11/2020 1905 Gross per 24 hour  Intake 587 ml  Output --  Net 587 ml   Filed Weights   10/06/20 0500 10/09/20 0500 10/09/20 0938  Weight: 27.8 kg 40.8 kg 39.3 kg    Data Reviewed: I have personally reviewed and interpreted daily labs, tele strips, imagings as discussed above. I reviewed all nursing notes, pharmacy notes, vitals, pertinent old records I have discussed plan of care as described above with RN and patient/family.  CBC: Recent Labs  Lab 10/06/20 0616 10/10/20 0417  WBC 4.0 5.6  NEUTROABS  --  2.8  HGB 10.1* 10.6*  HCT 31.1* 33.5*  MCV 88.9 91.5  PLT 128* 162   Basic Metabolic Panel: Recent Labs  Lab 10/10/20 0417  NA 138  K 3.6  CL 107  CO2 26  GLUCOSE 73  BUN 18  CREATININE 0.31*  CALCIUM 8.3*  MG 1.8    Studies: No results found.  Scheduled Meds:  baclofen  10 mg Oral TID   docusate  100 mg Oral BID   enoxaparin (LOVENOX) injection  30 mg Subcutaneous Q24H   feeding supplement  1  Bottle Oral TID BM   FLUoxetine  10 mg Oral Daily   LORazepam  1 mg Oral BID   OXcarbazepine  600 mg Oral BID   pantoprazole sodium  40 mg Oral Daily   polyethylene glycol  17 g Oral Daily   simethicone  40 mg Oral QID   tiaGABine  4 mg Oral BID   Vitamin D (Ergocalciferol)  50,000 Units Oral Q7 days   Continuous Infusions:  iron sucrose Stopped (10/11/20 2004)   promethazine (PHENERGAN) injection (IM or IVPB) Stopped (10/10/20 2013)   PRN Meds: acetaminophen **OR** acetaminophen, bisacodyl, ondansetron **OR** ondansetron (ZOFRAN) IV, promethazine (PHENERGAN) injection (IM or IVPB)  Time spent: 35 minutes  Author: Gillis Santa. MD [facility] 10/12/2020 1:31 PM  To reach On-call, see care teams to locate the attending and reach out to them via [email or web address]. If 7PM-7AM, please contact night-coverage If you still have difficulty reaching the  attending provider, please page the [facility] (Director on Call) for [facility] on amion for assistance.

## 2020-10-12 NOTE — Plan of Care (Signed)
No events overnight. X2 bowel movements, adequate UO. Meds given via baby bottle. Fall/safety precautions in place, rounding performed.  Problem: Education: Goal: Knowledge of General Education information will improve Description: Including pain rating scale, medication(s)/side effects and non-pharmacologic comfort measures Outcome: Not Progressing   Problem: Health Behavior/Discharge Planning: Goal: Ability to manage health-related needs will improve Outcome: Not Progressing   Problem: Clinical Measurements: Goal: Ability to maintain clinical measurements within normal limits will improve Outcome: Progressing Goal: Will remain free from infection Outcome: Progressing Goal: Diagnostic test results will improve Outcome: Progressing   Problem: Activity: Goal: Risk for activity intolerance will decrease Outcome: Progressing   Problem: Nutrition: Goal: Adequate nutrition will be maintained Outcome: Progressing   Problem: Coping: Goal: Level of anxiety will decrease Outcome: Progressing   Problem: Elimination: Goal: Will not experience complications related to bowel motility Outcome: Progressing Goal: Will not experience complications related to urinary retention Outcome: Progressing   Problem: Pain Managment: Goal: General experience of comfort will improve Outcome: Progressing   Problem: Safety: Goal: Ability to remain free from injury will improve Outcome: Progressing   Problem: Skin Integrity: Goal: Risk for impaired skin integrity will decrease Outcome: Progressing

## 2020-10-13 DIAGNOSIS — R627 Adult failure to thrive: Secondary | ICD-10-CM | POA: Diagnosis not present

## 2020-10-13 MED ORDER — FERROUS SULFATE 220 (44 FE) MG/5ML PO ELIX
220.0000 mg | ORAL_SOLUTION | Freq: Every day | ORAL | Status: DC
  Administered 2020-10-15 – 2020-10-17 (×3): 220 mg
  Filled 2020-10-13 (×3): qty 5

## 2020-10-13 NOTE — Progress Notes (Signed)
[facility] Progress Note  Patient: Alicia Duncan    YPP:[id number]  DOA: 09/03/2020     Date of Service: the patient was seen and examined on 10/13/2020  Chief Complaint  Patient presents with   social work   Brief hospital course: 45 year old F with PMH of Rett syndrome, cognitive impairment, developmental delay, nonverbal, seizure disorder and debility with complete dependence for ADLs brought to ED by EMS due to unsafe home environment.  Patient's caregiver went on vacation for several days and family at home have not been able to adequately take care of the patient.  Reportedly, patient's father who is advanced dementia.  Patient's brother with schizophrenia.  Patient is medically stable but has no safe disposition yet.  Patient's insurance and TOC working d/c.   Last note from [facility]: Angie with [facility] will come to the hospital to do an evaluation on the patient to see if she would be a good fit for her facility.  Hopefully she will be able to come out early this week.   Per charge RN the patient was evaluated by representative from a care home on 7/2.  Awaiting final decision regarding acceptance of patient.  Assessment and Plan: Principal Problem:   Failure to thrive in adult Active Problems:   Rett syndrome   Seizure disorder (HCC)   Severe protein-calorie malnutrition (HCC)   Constipation   Vaginal candidiasis   Fecal impaction (HCC)   Depression   # failure to thrive/Rett syndrome with cognitive disability/developmental delay/nonverbal with oral phase dysphagia at baseline-requires total care -Continue current dysphagia 1 diet with aspiration precautions.   -Supportive care.   -Needs a safe discharge plan which TOC is working on. -Per [employer] notes person from [place] who is scheduled to come and see the patient never showed up.  TOC working on another facility in [place].   -Per charge RN patient was evaluated by care home on 7/2 Pending decision from care  home  #  Fecal impaction/ileus -Patient had a NG tube placed which patient removed. -Manual disimpaction ordered however per RN note unable to touch stool. -Patient with multiple loose watery stools and bowel movements over the past few days. -Patient with abdominal distention on 10/01/2020 which has improved over the past 24 hours.  Abdomen is soft.  Abdomen is nontender to palpation. -Abdominal films from 10/01/2020 with significant stomach distention. -Repeat abdominal films this morning with interim resolution of gastric distention, diffuse small and large bowel distention noted.  Findings consistent with adynamic ileus, no large stool burden noted. -Patient with bowel movements per RN. -Patient seen in consultation by general surgery who feel no surgical intervention at this time and recommending aggressive bowel regimen. Plan: Patient has been having BMs.  Will continue current bowel regimen   #  Unwitnessed fall in the hospital -Noted to have been found on her knees next of the bed on 09/14/2020 with no injuries noted -No mental status changes noted. -It is noted that patient's brother was notified, and noted that brother stated patient with tendency to wiggle her way out of bed. -Fall precautions.  #  History of seizure disorder 7/8 seizure-like activity lasted 45 seconds as per RN, no postictal symptoms -Stable. -Continue Ativan, Trileptal, Gabitril.     #  Depression/anxiety -Stable.   -Continue fluoxetine, Ativan.   #  Candida vulvovaginitis -Status post nystatin.    #  Tachycardia -Resolved.  #  Hypernatremia -Likely secondary to dehydration secondary to multiple bowel movements and poor  oral intake. -Urine sodium 37, urine creatinine 97.   -Improved on D5W. -Sodium level currently at 139.   -IV fluids have been saline locked.   -Follow.  #Iron deficiency, started Venofer 200 mg IV daily for 5 days, start oral supplement at discharge. #Vitamin D insufficiency,  started oral supplement.  #  Severe protein calorie malnutrition -Likely secondary to Rett syndrome. -Patient with severe muscle depletion, severe fat depletion, percent weight loss 11.6% x 6 months. -Nutritional supplementation with Ensure discontinued due to gaseous distention.   -Continue dysphagia 1 diet  -Dietitian following.      Body mass index is 17.5 kg/m.  Nutrition Problem: Severe Malnutrition Etiology: chronic illness (Rett syndrome) Interventions: Interventions: Boost Breeze      Diet: Dysphagia 1 diet DVT Prophylaxis: Subcutaneous Lovenox   Advance goals of care discussion: DNR  Family Communication: family was Not present at bedside, at the time of interview.    Disposition:  Pt is from Home, admitted with unsafe home environment, still has no place to go, which precludes a safe discharge. Discharge to Care home in [place], which need inspection and then patient can be discharged. Difficult to place patient Yes  Subjective: No overnight issues, laying comfortably, unable to offer any complaints. We will continue above management and continue to monitor.   Physical Exam: General:  alert not oriented to time, place, and person.  Appear in no distress Eyes: PERRLA ENT: Oral Mucosa Clear, moist  Neck: no JVD,  Cardiovascular: S1 and S2 Present, no Murmur,  Respiratory: good respiratory effort, Bilateral Air entry equal and Decreased, no Crackles, no wheezes Abdomen: Bowel Sound present, Soft and no tenderness,  Skin: no rashes Extremities: no Pedal edema, no calf tenderness Neurologic: without any new focal findings Gait not checked due to patient safety concerns  Vitals:   10/12/20 1932 10/12/20 2352 10/13/20 0523 10/13/20 0720  BP: 122/82 98/64 93/63  110/63  Pulse: 95 82 (!) 59 79  Resp: 16 18 16 16   Temp: 98.3 F (36.8 C) 97.9 F (36.6 C) 98.1 F (36.7 C) 97.8 F (36.6 C)  TempSrc:  Oral  Oral  SpO2: 100% 95% 100% 99%  Weight:      Height:         Intake/Output Summary (Last 24 hours) at 10/13/2020 1108 Last data filed at 10/13/2020 1037 Gross per 24 hour  Intake 0 ml  Output --  Net 0 ml   Filed Weights   10/06/20 0500 10/09/20 0500 10/09/20 0938  Weight: 27.8 kg 40.8 kg 39.3 kg    Data Reviewed: I have personally reviewed and interpreted daily labs, tele strips, imagings as discussed above. I reviewed all nursing notes, pharmacy notes, vitals, pertinent old records I have discussed plan of care as described above with RN and patient/family.  CBC: Recent Labs  Lab 10/10/20 0417  WBC 5.6  NEUTROABS 2.8  HGB 10.6*  HCT 33.5*  MCV 91.5  PLT 162   Basic Metabolic Panel: Recent Labs  Lab 10/10/20 0417  NA 138  K 3.6  CL 107  CO2 26  GLUCOSE 73  BUN 18  CREATININE 0.31*  CALCIUM 8.3*  MG 1.8    Studies: No results found.  Scheduled Meds:  baclofen  10 mg Oral TID   docusate  100 mg Oral BID   enoxaparin (LOVENOX) injection  30 mg Subcutaneous Q24H   feeding supplement  1 Bottle Oral TID BM   FLUoxetine  10 mg Oral Daily   LORazepam  1 mg Oral BID   OXcarbazepine  600 mg Oral BID   pantoprazole sodium  40 mg Oral Daily   polyethylene glycol  17 g Oral Daily   simethicone  40 mg Oral QID   tiaGABine  4 mg Oral BID   Vitamin D (Ergocalciferol)  50,000 Units Oral Q7 days   Continuous Infusions:  iron sucrose 200 mg (10/12/20 1800)   promethazine (PHENERGAN) injection (IM or IVPB) Stopped (10/10/20 2013)   PRN Meds: acetaminophen **OR** acetaminophen, bisacodyl, ondansetron **OR** ondansetron (ZOFRAN) IV, promethazine (PHENERGAN) injection (IM or IVPB)  Time spent: 35 minutes  Author: Gillis Santa. MD [facility] 10/13/2020 11:08 AM  To reach On-call, see care teams to locate the attending and reach out to them via [email or web address]. If 7PM-7AM, please contact night-coverage If you still have difficulty reaching the attending provider, please page the [facility] (Director on Call) for [facility] on amion for assistance.

## 2020-10-14 DIAGNOSIS — R627 Adult failure to thrive: Secondary | ICD-10-CM | POA: Diagnosis not present

## 2020-10-14 NOTE — Progress Notes (Signed)
   10/14/20 1159  Assess: MEWS Score  Temp 98.5 F (36.9 C)  BP 99/60  Pulse Rate (!) 111  Resp 18  Level of Consciousness Alert  SpO2 96 %  O2 Device Room Air  Assess: MEWS Score  MEWS Temp 0  MEWS Systolic 1  MEWS Pulse 2  MEWS RR 0  MEWS LOC 0  MEWS Score 3  MEWS Score Color Yellow  Assess: if the MEWS score is Yellow or Red  Were vital signs taken at a resting state? Yes  Focused Assessment No change from prior assessment  Does the patient meet 2 or more of the SIRS criteria? No  Early Detection of Sepsis Score *See Row Information* Low  MEWS guidelines implemented *See Row Information* Yes  Treat  MEWS Interventions Other (Comment) (pulse rate rechecked)  Pain Scale PAINAD  Faces Pain Scale 0  Take Vital Signs  Increase Vital Sign Frequency  Yellow: Q 2hr X 2 then Q 4hr X 2, if remains yellow, continue Q 4hrs  Escalate  MEWS: Escalate Yellow: discuss with charge nurse/RN and consider discussing with provider and RRT  Notify: Charge Nurse/RN  Name of Charge Nurse/RN Notified Theodosia Quay, RN  Date Charge Nurse/RN Notified 10/14/20  Time Charge Nurse/RN Notified 1230  Document  Patient Outcome Other (Comment) (Will continue to monitor)  Progress note created (see row info) Yes  Assess: SIRS CRITERIA  SIRS Temperature  0  SIRS Pulse 1  SIRS Respirations  0  SIRS WBC 0  SIRS Score Sum  1

## 2020-10-14 NOTE — Progress Notes (Signed)
[facility] Progress Note  Patient: Alicia Duncan    PYP:[id number]  DOA: 09/03/2020     Date of Service: the patient was seen and examined on 10/14/2020  Chief Complaint  Patient presents with   social work   Brief hospital course: 45 year old F with PMH of Rett syndrome, cognitive impairment, developmental delay, nonverbal, seizure disorder and debility with complete dependence for ADLs brought to ED by EMS due to unsafe home environment.  Patient's caregiver went on vacation for several days and family at home have not been able to adequately take care of the patient.  Reportedly, patient's father who is advanced dementia.  Patient's brother with schizophrenia.  Patient is medically stable but has no safe disposition yet.  Patient's insurance and TOC working d/c.   Last note from [facility]: Angie with [facility] will come to the hospital to do an evaluation on the patient to see if she would be a good fit for her facility.  Hopefully she will be able to come out early this week.   Per charge RN the patient was evaluated by representative from a care home on 7/2.  Awaiting final decision regarding acceptance of patient.  Assessment and Plan: Principal Problem:   Failure to thrive in adult Active Problems:   Rett syndrome   Seizure disorder (HCC)   Severe protein-calorie malnutrition (HCC)   Constipation   Vaginal candidiasis   Fecal impaction (HCC)   Depression   # failure to thrive/Rett syndrome with cognitive disability/developmental delay/nonverbal with oral phase dysphagia at baseline-requires total care -Continue current dysphagia 1 diet with aspiration precautions.   -Supportive care.   -Needs a safe discharge plan which TOC is working on. -Per [employer] notes person from [place] who is scheduled to come and see the patient never showed up.  TOC working on another facility in [place].   -Per charge RN patient was evaluated by care home on 7/2 Pending decision from care  home  #  Fecal impaction/ileus -Patient had a NG tube placed which patient removed. -Manual disimpaction ordered however per RN note unable to touch stool. -Patient with multiple loose watery stools and bowel movements over the past few days. -Patient with abdominal distention on 10/01/2020 which has improved over the past 24 hours.  Abdomen is soft.  Abdomen is nontender to palpation. -Abdominal films from 10/01/2020 with significant stomach distention. -Repeat abdominal films this morning with interim resolution of gastric distention, diffuse small and large bowel distention noted.  Findings consistent with adynamic ileus, no large stool burden noted. -Patient with bowel movements per RN. -Patient seen in consultation by general surgery who feel no surgical intervention at this time and recommending aggressive bowel regimen. Plan: Patient has been having BMs.  Will continue current bowel regimen   #  Unwitnessed fall in the hospital -Noted to have been found on her knees next of the bed on 09/14/2020 with no injuries noted -No mental status changes noted. -It is noted that patient's brother was notified, and noted that brother stated patient with tendency to wiggle her way out of bed. -Fall precautions.  #  History of seizure disorder 7/8 seizure-like activity lasted 45 seconds as per RN, no postictal symptoms -Stable. -Continue Ativan, Trileptal, Gabitril.     #  Depression/anxiety -Stable.   -Continue fluoxetine, Ativan.   #  Candida vulvovaginitis -Status post nystatin.    #  Tachycardia -Resolved.  #  Hypernatremia -Likely secondary to dehydration secondary to multiple bowel movements and poor  oral intake. -Urine sodium 37, urine creatinine 97.   -Improved on D5W. -Sodium level currently at 139.   -IV fluids have been saline locked.   -Follow.  #Iron deficiency, started Venofer 200 mg IV daily for 5 days, start oral supplement at discharge. #Vitamin D insufficiency,  started oral supplement.  #  Severe protein calorie malnutrition -Likely secondary to Rett syndrome. -Patient with severe muscle depletion, severe fat depletion, percent weight loss 11.6% x 6 months. -Nutritional supplementation with Ensure discontinued due to gaseous distention.   -Continue dysphagia 1 diet  -Dietitian following.      Body mass index is 17.5 kg/m.  Nutrition Problem: Severe Malnutrition Etiology: chronic illness (Rett syndrome) Interventions: Interventions: Boost Breeze      Diet: Dysphagia 1 diet DVT Prophylaxis: Subcutaneous Lovenox   Advance goals of care discussion: DNR  Family Communication: family was Not present at bedside, at the time of interview.    Disposition:  Pt is from Home, admitted with unsafe home environment, still has no place to go, which precludes a safe discharge. Discharge to Care home in [place], which need inspection and then patient can be discharged. Difficult to place patient Yes  Subjective: No overnight issues, laying comfortably, unable to offer any complaints. We will continue above management and continue to monitor.   Physical Exam: General:  alert not oriented to time, place, and person.  Appear in no distress Eyes: PERRLA ENT: Oral Mucosa Clear, moist  Neck: no JVD,  Cardiovascular: S1 and S2 Present, no Murmur,  Respiratory: good respiratory effort, Bilateral Air entry equal and Decreased, no Crackles, no wheezes Abdomen: Bowel Sound present, Soft and no tenderness,  Skin: no rashes Extremities: no Pedal edema, no calf tenderness Neurologic: without any new focal findings Gait not checked due to patient safety concerns  Vitals:   10/13/20 2045 10/14/20 0003 10/14/20 0457 10/14/20 0719  BP: 108/72 104/70 93/63 111/71  Pulse: 75 84 66 72  Resp: 15 16 16 18   Temp: 98.1 F (36.7 C) 98.1 F (36.7 C) 98.1 F (36.7 C) 97.9 F (36.6 C)  TempSrc:      SpO2: 100% 98% 98% 100%  Weight:      Height:         Intake/Output Summary (Last 24 hours) at 10/14/2020 1111 Last data filed at 10/14/2020 1039 Gross per 24 hour  Intake 120 ml  Output --  Net 120 ml   Filed Weights   10/06/20 0500 10/09/20 0500 10/09/20 0938  Weight: 27.8 kg 40.8 kg 39.3 kg    Data Reviewed: I have personally reviewed and interpreted daily labs, tele strips, imagings as discussed above. I reviewed all nursing notes, pharmacy notes, vitals, pertinent old records I have discussed plan of care as described above with RN and patient/family.  CBC: Recent Labs  Lab 10/10/20 0417  WBC 5.6  NEUTROABS 2.8  HGB 10.6*  HCT 33.5*  MCV 91.5  PLT 162   Basic Metabolic Panel: Recent Labs  Lab 10/10/20 0417  NA 138  K 3.6  CL 107  CO2 26  GLUCOSE 73  BUN 18  CREATININE 0.31*  CALCIUM 8.3*  MG 1.8    Studies: No results found.  Scheduled Meds:  baclofen  10 mg Oral TID   docusate  100 mg Oral BID   enoxaparin (LOVENOX) injection  30 mg Subcutaneous Q24H   feeding supplement  1 Bottle Oral TID BM   [START ON 10/15/2020] ferrous sulfate  220 mg Per Tube Q  breakfast   FLUoxetine  10 mg Oral Daily   LORazepam  1 mg Oral BID   OXcarbazepine  600 mg Oral BID   pantoprazole sodium  40 mg Oral Daily   polyethylene glycol  17 g Oral Daily   simethicone  40 mg Oral QID   tiaGABine  4 mg Oral BID   Vitamin D (Ergocalciferol)  50,000 Units Oral Q7 days   Continuous Infusions:  iron sucrose 440 mL/hr at 10/13/20 2119   promethazine (PHENERGAN) injection (IM or IVPB) Stopped (10/10/20 2013)   PRN Meds: acetaminophen **OR** acetaminophen, bisacodyl, ondansetron **OR** ondansetron (ZOFRAN) IV, promethazine (PHENERGAN) injection (IM or IVPB)  Time spent: 35 minutes  Author: Gillis Santa. MD [facility] 10/14/2020 11:11 AM  To reach On-call, see care teams to locate the attending and reach out to them via [email or web address]. If 7PM-7AM, please contact night-coverage If you still have difficulty reaching the  attending provider, please page the [facility] (Director on Call) for [facility] on amion for assistance.

## 2020-10-15 DIAGNOSIS — R627 Adult failure to thrive: Secondary | ICD-10-CM | POA: Diagnosis not present

## 2020-10-15 NOTE — Progress Notes (Signed)
Nutrition Follow-up  DOCUMENTATION CODES:  Severe malnutrition in context of chronic illness, Underweight  INTERVENTION:  Continue Ensure Enlive TID.  Continue RN assistance with meals.  Continue current diet as ordered by SLP.  NUTRITION DIAGNOSIS:  Severe Malnutrition related to chronic illness (Rett syndrome) as evidenced by severe muscle depletion, severe fat depletion, percent weight loss (11.6% x 6 months). - ongoing  GOAL:  Patient will meet greater than or equal to 90% of their needs, Weight gain - progressing  MONITOR:  PO intake, Supplement acceptance, Labs, Weight trends  REASON FOR ASSESSMENT:  Other (Comment), Malnutrition Screening Tool (Low BMI, 13.48)    ASSESSMENT:  Pt brought to ED after concerns were raised over her care at home. Caregiver has been on vacation for several days and family at home have not been able to adequately take care of the patient. 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  Pt intake improved more recently. Pt clinically stable.  Average Meal Intake: 5/30-5/31: 50% intake x 1 recorded meal 6/1-6/7: 38% intake x 2 recorded meals 6/8-6/14: 19% intake x 8 recorded meals 6/15-6/19: 36% intake x 7 recorded meals 6/23-6/27: 50% intake x 6 recorded meals 7/9-7/10: 82% intake x 6 recorded meals  Medications: reviewed; colace BID, EE TID, ferrous sulfate, Ativan BID, Protonix, miralax, Vitamin D 70177 units weekly  Labs: reviewed  Diet Order:   Diet Order             DIET - DYS 1 Room service appropriate? Yes; Fluid consistency: Thin  Diet effective now           Diet - low sodium heart healthy                  EDUCATION NEEDS:  Not appropriate for education at this time  Skin:  Skin Assessment: Reviewed RN Assessment  Last BM:  10/14/20 - Type 5, small  Height:  Ht Readings from Last 1 Encounters:   09/03/20 4\' 11"  (1.499 m)   Weight:  Wt Readings from Last 1 Encounters:  10/09/20 39.3 kg   Ideal Body Weight:  44.3 kg  BMI:  Body mass index is 17.5 kg/m.  Estimated Nutritional Needs:  Kcal:  1400-1600 kcal/d Protein:  70-85 g/d Fluid:  >1500 mL/d  12/10/20, RD, LDN (she/her/hers) Registered Dietitian I After-Hours/Weekend Pager # in [place]

## 2020-10-15 NOTE — Progress Notes (Signed)
PROGRESS NOTE  FREYA ZOBRIST  DOB: 02-Sep-1975  PCP: [name], MD HEN:[id number]  DOA: 09/03/2020  LOS: 40 days  Hospital Day: 30   Chief Complaint  Patient presents with   social work    Brief narrative: ANALISE GLOTFELTY is a 45 y.o. female with PMH significant for Rett syndrome, cognitive impairment, developmental delay, nonverbal, seizure disorder and debility with complete dependence for ADLs. 5/30, patient was brought to ED by EMS due to unsafe home environment.  Patient's caregiver went on vacation for several days and family at home have not been able to adequately take care of the patient.  Reportedly, patient's father ha advanced dementia and brother has schizophrenia.  Patient is medically stable but has no safe disposition yet.    Subjective: Patient was seen and examined this morning.  Alert.  Awake.  Unable to follow commands.  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   -Supportive care.   -Needs a safe discharge plan which TOC is working on.  Fecal impaction/ileus -Resolved.  Continue bowel regimen  History of seizure disorder -7/8 seizure-like activity lasted 45 seconds as per RN, no postictal symptoms -Stable. -Continue Ativan, Trileptal, Gabitril.     Depression/anxiety -Stable.   -Continue fluoxetine, Ativan.   Candida vulvovaginitis -Status post nystatin.    Tachycardia -Resolved.  Iron deficiency -started Venofer 200 mg IV daily for 5 days, start oral supplement at discharge.  Vitamin D insufficiency -started oral supplement.  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 -Patient with severe muscle depletion, severe fat depletion, percent weight loss 11.6% x 6 months. -Nutritional supplementation with Ensure discontinued  due to gaseous distention.   -Continue dysphagia 1 diet  -Dietitian following.     Mobility: Encourage ambulation if able to follow commands Code Status:   Code Status: DNR  Nutritional status: Body mass index is 17.5 kg/m. Nutrition Problem: Severe Malnutrition Etiology: chronic illness (Rett syndrome) Signs/Symptoms: severe muscle depletion, severe fat depletion, percent weight loss (11.6% x 6 months) Percent weight loss: 11.6 % (x 6 months) Diet:  Diet Order             DIET - DYS 1 Room service appropriate? Yes; Fluid consistency: Thin  Diet effective now           Diet - low sodium heart healthy                  DVT prophylaxis:  enoxaparin (LOVENOX) injection 30 mg Start: 09/03/20 2200   Antimicrobials: None Fluid: Currently not on IV fluids Consultants: None Family Communication: None at bedside  Status is: Inpatient  Remains inpatient appropriate because: Difficult to place  Dispo: The patient is from: Home              Anticipated d/c is to: Facility discharge.  Difficult to place              Patient currently is medically stable to d/c.   Difficult to place patient Yes     Infusions:   promethazine (PHENERGAN) injection (IM or IVPB) Stopped (10/10/20 2013)    Scheduled Meds:  baclofen  10 mg Oral TID   docusate  100 mg Oral BID   enoxaparin (LOVENOX) injection  30 mg Subcutaneous Q24H   feeding supplement  1 Bottle Oral TID BM   ferrous sulfate  220 mg Per  Tube Q breakfast   FLUoxetine  10 mg Oral Daily   LORazepam  1 mg Oral BID   OXcarbazepine  600 mg Oral BID   pantoprazole sodium  40 mg Oral Daily   polyethylene glycol  17 g Oral Daily   simethicone  40 mg Oral QID   tiaGABine  4 mg Oral BID   Vitamin D (Ergocalciferol)  50,000 Units Oral Q7 days    Antimicrobials: Anti-infectives (From admission, onward)    None       PRN meds: acetaminophen **OR** acetaminophen, bisacodyl, ondansetron **OR** ondansetron (ZOFRAN) IV,  promethazine (PHENERGAN) injection (IM or IVPB)   Objective: Vitals:   10/15/20 0333 10/15/20 0901  BP: 105/72 106/66  Pulse: 75 83  Resp: 16 14  Temp:  97.7 F (36.5 C)  SpO2: 100% 90%    Intake/Output Summary (Last 24 hours) at 10/15/2020 1428 Last data filed at 10/15/2020 1024 Gross per 24 hour  Intake 0 ml  Output --  Net 0 ml   Filed Weights   10/06/20 0500 10/09/20 0500 10/09/20 0938  Weight: 27.8 kg 40.8 kg 39.3 kg   Weight change:  Body mass index is 17.5 kg/m.   Physical Exam: General exam: Middle-aged Caucasian female.  Not in distress Skin: No rashes, lesions or ulcers. HEENT: Atraumatic, normocephalic, no obvious bleeding Lungs: Clear to auscultation bilaterally CVS: Regular rate and rhythm, no murmur GI/Abd soft, nontender, nondistended, bowel sound present CNS: Alert, awake, unable to follow commands Psychiatry: Baseline chronic psychiatric issues Extremities: No pedal edema, no calf tenderness  Data Review: I have personally reviewed the laboratory data and studies available.  Recent Labs  Lab 10/10/20 0417  WBC 5.6  NEUTROABS 2.8  HGB 10.6*  HCT 33.5*  MCV 91.5  PLT 162   Recent Labs  Lab 10/10/20 0417  NA 138  K 3.6  CL 107  CO2 26  GLUCOSE 73  BUN 18  CREATININE 0.31*  CALCIUM 8.3*  MG 1.8    F/u labs none Unresulted Labs (From admission, onward)    None       Signed, Lorin Glass, MD [facility] 10/15/2020

## 2020-10-16 DIAGNOSIS — R627 Adult failure to thrive: Secondary | ICD-10-CM | POA: Diagnosis not present

## 2020-10-16 NOTE — Progress Notes (Signed)
PROGRESS NOTE  Alicia Duncan  DOB: 1976/03/08  PCP: [name], MD ZOX:[id number]  DOA: 09/03/2020  LOS: 41 days  Hospital Day: 78   Chief Complaint  Patient presents with   social work    Brief narrative: Alicia Duncan is a 45 y.o. female with PMH significant for Rett syndrome, cognitive impairment, developmental delay, nonverbal, seizure disorder and debility with complete dependence for ADLs. 5/30, patient was brought to ED by EMS due to unsafe home environment.  Patient's caregiver went on vacation for several days and family at home was not able to adequately take care of the patient.  Reportedly, patient's father ha advanced dementia and brother has schizophrenia.  Patient is medically stable but has no safe disposition yet.    Subjective: Patient was seen and examined this morning.   Alert.  Awake.  Unable to follow commands. No change in last 24 hours follow-up  Assessment/Plan: Failure to thrive Rett syndrome with cognitive disability Developmental delay/nonverbal with oral phase dysphagia at baseline -requires total care -Continue current dysphagia 1 diet with aspiration precautions.   -Supportive care.   -Needs a safe discharge plan which TOC is working on.  Fecal impaction/ileus -Resolved.  Continue bowel regimen  History of seizure disorder -7/8 seizure-like activity lasted 45 seconds as per RN, no postictal symptoms -Stable. -Continue Ativan, Trileptal, Gabitril.     Depression/anxiety -Stable.   -Continue fluoxetine, Ativan.   Candida vulvovaginitis -Status post nystatin.    Tachycardia -Resolved.  Iron deficiency -started Venofer 200 mg IV daily for 5 days, start oral supplement at discharge.  Vitamin D insufficiency -started oral supplement.  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 -Patient with severe muscle depletion, severe fat depletion, percent weight loss 11.6% x 6 months. -Nutritional  supplementation with Ensure discontinued due to gaseous distention.   -Continue dysphagia 1 diet  -Dietitian following.     Mobility: Encourage ambulation if able to follow commands Code Status:   Code Status: DNR  Nutritional status: Body mass index is 17.5 kg/m. Nutrition Problem: Severe Malnutrition Etiology: chronic illness (Rett syndrome) Signs/Symptoms: severe muscle depletion, severe fat depletion, percent weight loss (11.6% x 6 months) Percent weight loss: 11.6 % (x 6 months) Diet:  Diet Order             DIET - DYS 1 Room service appropriate? Yes; Fluid consistency: Thin  Diet effective now           Diet - low sodium heart healthy                  DVT prophylaxis:  enoxaparin (LOVENOX) injection 30 mg Start: 09/03/20 2200   Antimicrobials: None Fluid: Currently not on IV fluids Consultants: None Family Communication: None at bedside  Status is: Inpatient  Remains inpatient appropriate because: Difficult to place  Dispo: The patient is from: Home              Anticipated d/c is to: Facility discharge.  Difficult to place              Patient currently is medically stable to d/c.   Difficult to place patient Yes     Infusions:   promethazine (PHENERGAN) injection (IM or IVPB) Stopped (10/10/20 2013)    Scheduled Meds:  baclofen  10 mg Oral TID   docusate  100 mg Oral BID   enoxaparin (LOVENOX) injection  30 mg Subcutaneous Q24H   feeding supplement  1 Bottle Oral TID BM  ferrous sulfate  220 mg Per Tube Q breakfast   FLUoxetine  10 mg Oral Daily   LORazepam  1 mg Oral BID   OXcarbazepine  600 mg Oral BID   pantoprazole sodium  40 mg Oral Daily   polyethylene glycol  17 g Oral Daily   simethicone  40 mg Oral QID   tiaGABine  4 mg Oral BID   Vitamin D (Ergocalciferol)  50,000 Units Oral Q7 days    Antimicrobials: Anti-infectives (From admission, onward)    None       PRN meds: acetaminophen **OR** acetaminophen, bisacodyl, ondansetron  **OR** ondansetron (ZOFRAN) IV, promethazine (PHENERGAN) injection (IM or IVPB)   Objective: Vitals:   10/16/20 0313 10/16/20 0726  BP: 123/77 115/77  Pulse: 83 87  Resp: 16 16  Temp: (!) 96.6 F (35.9 C) (!) 97.4 F (36.3 C)  SpO2: 92% 97%    Intake/Output Summary (Last 24 hours) at 10/16/2020 1054 Last data filed at 10/16/2020 1019 Gross per 24 hour  Intake 240 ml  Output --  Net 240 ml    Filed Weights   10/06/20 0500 10/09/20 0500 10/09/20 0938  Weight: 27.8 kg 40.8 kg 39.3 kg   Weight change:  Body mass index is 17.5 kg/m.   Physical Exam: General exam: Middle-aged Caucasian female.  Not in distress Skin: No rashes, lesions or ulcers. HEENT: Atraumatic, normocephalic, no obvious bleeding Lungs: Clear to auscultation bilaterally CVS: Regular rate and rhythm, no murmur GI/Abd soft, nontender, nondistended, bowel sound present CNS: Alert, awake, unable to follow commands Psychiatry: Baseline chronic psychiatric issues Extremities: No pedal edema, no calf tenderness  Data Review: I have personally reviewed the laboratory data and studies available.  Recent Labs  Lab 10/10/20 0417  WBC 5.6  NEUTROABS 2.8  HGB 10.6*  HCT 33.5*  MCV 91.5  PLT 162    Recent Labs  Lab 10/10/20 0417  NA 138  K 3.6  CL 107  CO2 26  GLUCOSE 73  BUN 18  CREATININE 0.31*  CALCIUM 8.3*  MG 1.8     F/u labs none Unresulted Labs (From admission, onward)    None       Signed, Lorin Glass, MD [facility] 10/16/2020

## 2020-10-17 DIAGNOSIS — R627 Adult failure to thrive: Secondary | ICD-10-CM | POA: Diagnosis not present

## 2020-10-17 MED ORDER — FERROUS SULFATE 220 (44 FE) MG/5ML PO ELIX
220.0000 mg | ORAL_SOLUTION | Freq: Every day | ORAL | Status: DC
  Administered 2020-10-18 – 2020-10-24 (×7): 220 mg via ORAL
  Filled 2020-10-17 (×7): qty 5

## 2020-10-17 NOTE — Plan of Care (Signed)
Pt compliant with meds. Took meds crushed in boost with bottle. Abdomen firm and distended. Bowel sounds positive. Pt received simethicone and colace. Large bm approx 1 hour later and abdomen decreased in size.  Problem: Education: Goal: Knowledge of General Education information will improve Description: Including pain rating scale, medication(s)/side effects and non-pharmacologic comfort measures Outcome: Progressing   Problem: Health Behavior/Discharge Planning: Goal: Ability to manage health-related needs will improve Outcome: Progressing   Problem: Clinical Measurements: Goal: Ability to maintain clinical measurements within normal limits will improve Outcome: Progressing Goal: Will remain free from infection Outcome: Progressing Goal: Diagnostic test results will improve Outcome: Progressing   Problem: Activity: Goal: Risk for activity intolerance will decrease Outcome: Progressing   Problem: Nutrition: Goal: Adequate nutrition will be maintained Outcome: Progressing   Problem: Coping: Goal: Level of anxiety will decrease Outcome: Progressing   Problem: Elimination: Goal: Will not experience complications related to bowel motility Outcome: Progressing Goal: Will not experience complications related to urinary retention Outcome: Progressing   Problem: Pain Managment: Goal: General experience of comfort will improve Outcome: Progressing   Problem: Safety: Goal: Ability to remain free from injury will improve Outcome: Progressing   Problem: Skin Integrity: Goal: Risk for impaired skin integrity will decrease Outcome: Progressing

## 2020-10-17 NOTE — Progress Notes (Signed)
PROGRESS NOTE  Alicia Duncan  DOB: 1975/08/21  PCP: [name], MD WLS:[id number]  DOA: 09/03/2020  LOS: 42 days  Hospital Day: 45   Chief Complaint  Patient presents with   social work    Brief narrative: Alicia Duncan is a 45 y.o. female with PMH significant for Rett syndrome, cognitive impairment, developmental delay, nonverbal, seizure disorder and debility with complete dependence for ADLs. 5/30, patient was brought to ED by EMS due to unsafe home environment.  Patient's caregiver went on vacation for several days and family at home was not able to adequately take care of the patient.  Reportedly, patient's father ha advanced dementia and brother has schizophrenia.  Patient is medically stable but has no safe disposition yet.    Subjective: Patient was seen and examined this morning.   Not in distress.  No new symptoms.  Unable to follow command.  Alert, awake.  No essential change.  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   -Continue supportive care.   -Needs a safe discharge plan which TOC is working on.  Fecal impaction/ileus -Resolved.  Continue bowel regimen  History of seizure disorder -7/8 seizure-like activity lasted 45 seconds as per RN, no postictal symptoms -Stable. -Continue Ativan, Trileptal, Gabitril.     Depression/anxiety -Stable.   -Continue fluoxetine, Ativan.   Candida vulvovaginitis -Status post nystatin.    Iron deficiency -Given IV Venofer in the hospital.  Oral supplement started.  Vitamin D insufficiency -started oral supplement.  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 -Patient with severe muscle depletion, severe fat depletion, percent weight loss 11.6% x 6 months. -Nutritional supplementation with Ensure  discontinued due to gaseous distention.   -Continue dysphagia 1 diet  -Dietitian following.     Mobility: Encourage ambulation if able to follow commands Code Status:   Code Status: DNR  Nutritional status: Body mass index is 17.5 kg/m. Nutrition Problem: Severe Malnutrition Etiology: chronic illness (Rett syndrome) Signs/Symptoms: severe muscle depletion, severe fat depletion, percent weight loss (11.6% x 6 months) Percent weight loss: 11.6 % (x 6 months) Diet:  Diet Order             DIET - DYS 1 Room service appropriate? Yes; Fluid consistency: Thin  Diet effective now           Diet - low sodium heart healthy                  DVT prophylaxis:  enoxaparin (LOVENOX) injection 30 mg Start: 09/03/20 2200   Antimicrobials: None Fluid: Currently not on IV fluids Consultants: None Family Communication: None at bedside  Status is: Inpatient  Remains inpatient appropriate because: Difficult to place  Dispo: The patient is from: Home              Anticipated d/c is to: Facility discharge.  Difficult to place              Patient currently is medically stable to d/c.   Difficult to place patient Yes     Infusions:   promethazine (PHENERGAN) injection (IM or IVPB) Stopped (10/10/20 2013)    Scheduled Meds:  baclofen  10 mg Oral TID   docusate  100 mg Oral BID   enoxaparin (LOVENOX) injection  30 mg Subcutaneous Q24H   feeding supplement  1 Bottle Oral TID BM   ferrous  sulfate  220 mg Per Tube Q breakfast   FLUoxetine  10 mg Oral Daily   LORazepam  1 mg Oral BID   OXcarbazepine  600 mg Oral BID   pantoprazole sodium  40 mg Oral Daily   polyethylene glycol  17 g Oral Daily   simethicone  40 mg Oral QID   tiaGABine  4 mg Oral BID   Vitamin D (Ergocalciferol)  50,000 Units Oral Q7 days    Antimicrobials: Anti-infectives (From admission, onward)    None       PRN meds: acetaminophen **OR** acetaminophen, bisacodyl, ondansetron **OR** ondansetron (ZOFRAN)  IV, promethazine (PHENERGAN) injection (IM or IVPB)   Objective: Vitals:   10/17/20 0528 10/17/20 0720  BP: 115/80 101/71  Pulse: 75 79  Resp: 16 16  Temp: (!) 97.4 F (36.3 C) 97.9 F (36.6 C)  SpO2: 100% 99%    Intake/Output Summary (Last 24 hours) at 10/17/2020 0956 Last data filed at 10/17/2020 0500 Gross per 24 hour  Intake 1008 ml  Output --  Net 1008 ml    Filed Weights   10/06/20 0500 10/09/20 0500 10/09/20 0938  Weight: 27.8 kg 40.8 kg 39.3 kg   Weight change:  Body mass index is 17.5 kg/m.   Physical Exam: General exam: Middle-aged Caucasian female.  Not in distress Skin: No rashes, lesions or ulcers. HEENT: Atraumatic, normocephalic, no obvious bleeding Lungs: Clear to auscultation bilaterally CVS: Regular rate and rhythm, no murmur GI/Abd soft, nontender, nondistended, bowel sound present CNS: Alert, awake, unable to follow commands Psychiatry: Baseline chronic psychiatric issues Extremities: No pedal edema, no calf tenderness  Data Review: I have personally reviewed the laboratory data and studies available.  No results for input(s): WBC, NEUTROABS, HGB, HCT, MCV, PLT in the last 168 hours.  No results for input(s): NA, K, CL, CO2, GLUCOSE, BUN, CREATININE, CALCIUM, MG, PHOS in the last 168 hours.   F/u labs none Unresulted Labs (From admission, onward)    None       Signed, Lorin Glass, MD [facility] 10/17/2020

## 2020-10-18 NOTE — Plan of Care (Signed)

## 2020-10-18 NOTE — Progress Notes (Signed)
PROGRESS NOTE  Alicia Duncan  DOB: Aug 08, 1975  PCP: [name], MD UUE:[id number]  DOA: 09/03/2020  LOS: 43 days  Hospital Day: 46   Chief Complaint  Patient presents with   social work    Brief narrative: Alicia Duncan is a 45 y.o. female with PMH significant for Rett syndrome, cognitive impairment, developmental delay, nonverbal, seizure disorder and debility with complete dependence for ADLs. 5/30, patient was brought to ED by EMS due to unsafe home environment.  Patient's caregiver went on vacation for several days and family at home was not able to adequately take care of the patient.  Reportedly, patient's father ha advanced dementia and brother has schizophrenia.  Patient is medically stable but has no safe disposition yet.    Subjective: Patient was seen and examined this morning.   Not in distress.  No new symptoms.  Unable to follow command.  Alert, awake.  No essential change.  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   -Continue supportive care.   -Needs a safe discharge plan which TOC is working on.  Fecal impaction/ileus -Resolved. Continue bowel regimen  History of seizure disorder -7/8 seizure-like activity lasted 45 seconds as per RN, no postictal symptoms -Stable. -Continue Ativan, Trileptal, Gabitril.     Depression/anxiety -Stable.   -Continue fluoxetine, Ativan.   Candida vulvovaginitis -Status post nystatin.    Iron deficiency -Given IV Venofer in the hospital.  Oral supplement started.  Vitamin D insufficiency -started oral supplement.  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 -Patient with severe muscle depletion, severe fat depletion, percent weight loss 11.6% x 6 months. -Nutritional supplementation with Ensure  discontinued due to gaseous distention.   -Continue dysphagia 1 diet  -Dietitian following.     Mobility: Encourage ambulation if able to follow commands Code Status:   Code Status: DNR  Nutritional status: Body mass index is 17.5 kg/m. Nutrition Problem: Severe Malnutrition Etiology: chronic illness (Rett syndrome) Signs/Symptoms: severe muscle depletion, severe fat depletion, percent weight loss (11.6% x 6 months) Percent weight loss: 11.6 % (x 6 months) Diet:  Diet Order             DIET - DYS 1 Room service appropriate? Yes; Fluid consistency: Thin  Diet effective now           Diet - low sodium heart healthy                  DVT prophylaxis:  enoxaparin (LOVENOX) injection 30 mg Start: 09/03/20 2200   Antimicrobials: None Fluid: Currently not on IV fluids Consultants: None Family Communication: None at bedside  Status is: Inpatient  Remains inpatient appropriate because: Difficult to place  Dispo: The patient is from: Home              Anticipated d/c is to: Facility discharge.  Difficult to place              Patient currently is medically stable to d/c.   Difficult to place patient Yes     Infusions:   promethazine (PHENERGAN) injection (IM or IVPB) Stopped (10/10/20 2013)    Scheduled Meds:  baclofen  10 mg Oral TID   docusate  100 mg Oral BID   enoxaparin (LOVENOX) injection  30 mg Subcutaneous Q24H   feeding supplement  1 Bottle Oral TID BM   ferrous sulfate  220 mg Oral Q breakfast   FLUoxetine  10 mg Oral Daily   LORazepam  1 mg Oral BID   OXcarbazepine  600 mg Oral BID   pantoprazole sodium  40 mg Oral Daily   polyethylene glycol  17 g Oral Daily   simethicone  40 mg Oral QID   tiaGABine  4 mg Oral BID   Vitamin D (Ergocalciferol)  50,000 Units Oral Q7 days    Antimicrobials: Anti-infectives (From admission, onward)    None       PRN meds: acetaminophen **OR** acetaminophen, bisacodyl, ondansetron **OR** ondansetron (ZOFRAN) IV,  promethazine (PHENERGAN) injection (IM or IVPB)   Objective: Vitals:   10/17/20 2340 10/18/20 0735  BP:  101/69  Pulse: 93 75  Resp:  16  Temp:  98.1 F (36.7 C)  SpO2: 99% 93%    Intake/Output Summary (Last 24 hours) at 10/18/2020 1036 Last data filed at 10/18/2020 1018 Gross per 24 hour  Intake 0 ml  Output --  Net 0 ml    Filed Weights   10/06/20 0500 10/09/20 0500 10/09/20 0938  Weight: 27.8 kg 40.8 kg 39.3 kg   Weight change:  Body mass index is 17.5 kg/m.   Physical Exam: General exam: Middle-aged Caucasian female. Not in distress Skin: No rashes, lesions or ulcers. HEENT: Atraumatic, normocephalic, no obvious bleeding Lungs: Clear to auscultation bilaterally CVS: Regular rate and rhythm, no murmur GI/Abd soft, nontender, nondistended, bowel sound present CNS: Alert, awake, unable to follow commands Psychiatry: Baseline chronic psychiatric issues Extremities: No pedal edema, no calf tenderness  Data Review: I have personally reviewed the laboratory data and studies available.  No results for input(s): WBC, NEUTROABS, HGB, HCT, MCV, PLT in the last 168 hours.  No results for input(s): NA, K, CL, CO2, GLUCOSE, BUN, CREATININE, CALCIUM, MG, PHOS in the last 168 hours.   F/u labs none Unresulted Labs (From admission, onward)    None       Signed, Lorin Glass, MD [facility] 10/18/2020

## 2020-10-18 NOTE — Progress Notes (Signed)
Nutrition Follow-up  DOCUMENTATION CODES:  Severe malnutrition in context of chronic illness, Underweight  INTERVENTION:  Continue current diet as ordered per SLP Nursing to assist with meals Ensure Enlive po TID, each supplement provides 350 kcal and 20 grams of protein Request new weight  NUTRITION DIAGNOSIS:  Severe Malnutrition related to chronic illness (Rett syndrome) as evidenced by severe muscle depletion, severe fat depletion, percent weight loss (11.6% x 6 months).  - ongoing  GOAL:  Patient will meet greater than or equal to 90% of their needs, Weight gain  - in progress  MONITOR:  PO intake, Supplement acceptance, Labs, Weight trends  REASON FOR ASSESSMENT:  Other (Comment), Malnutrition Screening Tool (Low BMI, 13.48)    ASSESSMENT:  Pt brought to ED after concerns were raised over her care at home. Caregiver has been on vacation for several days and family at home have not been able to adequately take care of the patient. 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  Pt remains medically stable at this time with poor/fair but consistent intake. Routinely accepting Ensure Enlive supplements TID. Will request new measured weight as none has been obtained this week to monitor for changes to weight. 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    Average Meal Intake: 5/30-5/31: 50% intake x 1 recorded meal 6/1-6/7: 38% intake x 2 recorded meals 6/8-6/14: 19% intake x 8 recorded meals 6/15-6/19: 36% intake x 7 recorded meals 6/23-6/27: 50% intake x 6 recorded meals 6/28-7/4: 44% intake x 9 recorded meals 7/5-7/11: 62% intake x 14 recorded meals 7/12-7/14: 38% intake x 6 recorded meals  Nutritionally Relevant Medications: Scheduled Meds:  baclofen  10 mg Oral TID   docusate  100 mg Oral BID   feeding supplement  1 Bottle Oral TID BM    ferrous sulfate  220 mg Oral Q breakfast   pantoprazole sodium  40 mg Oral Daily   polyethylene glycol  17 g Oral Daily   simethicone  40 mg Oral QID   Vitamin D (Ergocalciferol)  50,000 Units Oral Q7 days   PRN Meds: bisacodyl, ondansetron, promethazine  Labs Reviewed  Diet Order:   Diet Order             DIET - DYS 1 Room service appropriate? Yes; Fluid consistency: Thin  Diet effective now           Diet - low sodium heart healthy                  EDUCATION NEEDS:  Not appropriate for education at this time  Skin:  Skin Assessment: Reviewed RN Assessment  Last BM:  7/13 - type 6  Height:  Ht Readings from Last 1 Encounters:  09/03/20 4\' 11"  (1.499 m)   Weight:  Wt Readings from Last 1 Encounters:  10/09/20 39.3 kg   Ideal Body Weight:  44.3 kg  BMI:  Body mass index is 17.5 kg/m.  Estimated Nutritional Needs:  Kcal:  1400-1600 kcal/d Protein:  70-85 g/d Fluid:  >1500 mL/d  12/10/20, RD, LDN Clinical Dietitian Pager on Amion

## 2020-10-19 NOTE — Progress Notes (Signed)
PROGRESS NOTE  JILLIANN SUBRAMANIAN  DOB: July 14, 1975  PCP: [name], MD ZJI:[id number]  DOA: 09/03/2020  LOS: 44 days  Hospital Day: 61   Chief Complaint  Patient presents with   social work    Brief narrative: GHISLAINE HARCUM is a 45 y.o. female with PMH significant for Rett syndrome, cognitive impairment, developmental delay, nonverbal, seizure disorder and debility with complete dependence for ADLs. 5/30, patient was brought to ED by EMS due to unsafe home environment.  Patient's caregiver went on vacation for several days and family at home was not able to adequately take care of the patient.  Reportedly, patient's father ha advanced dementia and brother has schizophrenia.  Patient is medically stable but has no safe disposition yet.    Subjective: Patient was seen and examined this morning.   Not in distress.  No new symptoms.  Alert, awake but unable to follow commands no change in last 24 hours.  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   -Continue supportive care.   -Needs a safe discharge plan which TOC is working on.  Fecal impaction/ileus -Resolved. Continue bowel regimen  History of seizure disorder -7/8 seizure-like activity lasted 45 seconds as per RN, no postictal symptoms -Stable. -Continue Ativan, Trileptal, Gabitril.     Depression/anxiety -Stable.   -Continue fluoxetine, Ativan.   Candida vulvovaginitis -Status post nystatin.    Iron deficiency -Given IV Venofer in the hospital.  Oral supplement started.  Vitamin D insufficiency -started oral supplement.  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 -Patient with severe muscle depletion, severe fat depletion, percent weight loss 11.6% x 6 months. -Nutritional supplementation with  Ensure discontinued due to gaseous distention.   -Continue dysphagia 1 diet  -Dietitian following.     Mobility: Encourage ambulation if able to follow commands Code Status:   Code Status: DNR  Nutritional status: Body mass index is 17.5 kg/m. Nutrition Problem: Severe Malnutrition Etiology: chronic illness (Rett syndrome) Signs/Symptoms: severe muscle depletion, severe fat depletion, percent weight loss (11.6% x 6 months) Percent weight loss: 11.6 % (x 6 months) Diet:  Diet Order             DIET - DYS 1 Room service appropriate? Yes; Fluid consistency: Thin  Diet effective now           Diet - low sodium heart healthy                  DVT prophylaxis:  enoxaparin (LOVENOX) injection 30 mg Start: 09/03/20 2200   Antimicrobials: None Fluid: Currently not on IV fluids Consultants: None Family Communication: None at bedside  Status is: Inpatient  Remains inpatient appropriate because: Difficult to place  Dispo: The patient is from: Home              Anticipated d/c is to: Facility discharge.  Difficult to place              Patient currently is medically stable to d/c.   Difficult to place patient Yes     Infusions:   promethazine (PHENERGAN) injection (IM or IVPB) Stopped (10/10/20 2013)    Scheduled Meds:  baclofen  10 mg Oral TID   docusate  100 mg Oral BID   enoxaparin (LOVENOX) injection  30 mg Subcutaneous Q24H   feeding supplement  1 Bottle Oral TID BM  ferrous sulfate  220 mg Oral Q breakfast   FLUoxetine  10 mg Oral Daily   LORazepam  1 mg Oral BID   OXcarbazepine  600 mg Oral BID   pantoprazole sodium  40 mg Oral Daily   polyethylene glycol  17 g Oral Daily   simethicone  40 mg Oral QID   tiaGABine  4 mg Oral BID   Vitamin D (Ergocalciferol)  50,000 Units Oral Q7 days    Antimicrobials: Anti-infectives (From admission, onward)    None       PRN meds: acetaminophen **OR** acetaminophen, bisacodyl, ondansetron **OR** ondansetron  (ZOFRAN) IV, promethazine (PHENERGAN) injection (IM or IVPB)   Objective: Vitals:   10/19/20 0555 10/19/20 0733  BP: 100/68 101/60  Pulse: 67 75  Resp: 15 16  Temp: 98.4 F (36.9 C) 97.6 F (36.4 C)  SpO2: 97% 100%    Intake/Output Summary (Last 24 hours) at 10/19/2020 0940 Last data filed at 10/18/2020 1418 Gross per 24 hour  Intake 240 ml  Output --  Net 240 ml    Filed Weights   10/06/20 0500 10/09/20 0500 10/09/20 0938  Weight: 27.8 kg 40.8 kg 39.3 kg   Weight change:  Body mass index is 17.5 kg/m.   Physical Exam: General exam: Middle-aged Caucasian female. Not in distress Skin: No rashes, lesions or ulcers. HEENT: Atraumatic, normocephalic, no obvious bleeding Lungs: Clear to auscultation bilaterally CVS: Regular rate and rhythm, no murmur GI/Abd soft, nontender, nondistended, bowel sound present CNS: Alert, awake, unable to follow commands Psychiatry: Baseline chronic psychiatric issues Extremities: No pedal edema, no calf tenderness  Data Review: I have personally reviewed the laboratory data and studies available.  No results for input(s): WBC, NEUTROABS, HGB, HCT, MCV, PLT in the last 168 hours.  No results for input(s): NA, K, CL, CO2, GLUCOSE, BUN, CREATININE, CALCIUM, MG, PHOS in the last 168 hours.   F/u labs none Unresulted Labs (From admission, onward)    None       Signed, Lorin Glass, MD [facility] 10/19/2020

## 2020-10-19 NOTE — Plan of Care (Signed)

## 2020-10-20 DIAGNOSIS — R627 Adult failure to thrive: Secondary | ICD-10-CM | POA: Diagnosis not present

## 2020-10-20 NOTE — Progress Notes (Signed)
PROGRESS NOTE  Alicia Duncan  DOB: [birth year]  PCP: [name], MD RAQ:[id number]  DOA: 09/03/2020  LOS: 45 days  Hospital Day: 48   Chief Complaint  Patient presents with   social work    Brief narrative: Alicia Duncan is a 45 y.o. female with PMH significant for Rett syndrome, cognitive impairment, developmental delay, nonverbal, seizure disorder and debility with complete dependence for ADLs. 5/30, patient was brought to ED by EMS due to unsafe home environment.  Patient's caregiver went on vacation for several days and family at home was not able to adequately take care of the patient.  Reportedly, patient's father ha advanced dementia and brother has schizophrenia.  Patient is medically stable but has no safe disposition yet.    Subjective: Patient was seen and examined this morning.   Alert, awake, in distress.  Unable to follow commands. No change in status in last 7 days.  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   -Continue supportive care.   -Needs a safe discharge plan which TOC is working on.  Fecal impaction/ileus -Resolved. Continue bowel regimen  History of seizure disorder -7/8 seizure-like activity lasted 45 seconds as per RN, no postictal symptoms -Stable. -Continue Ativan, Trileptal, Gabitril.     Depression/anxiety -Stable.   -Continue fluoxetine, Ativan.   Candida vulvovaginitis -Status post nystatin.    Iron deficiency -Given IV Venofer in the hospital.  Oral supplement started.  Vitamin D insufficiency -started oral supplement.  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 -Patient with severe muscle depletion, severe fat depletion, percent weight loss 11.6% x 6 months. -Nutritional supplementation with Ensure  discontinued due to gaseous distention.   -Continue dysphagia 1 diet  -Dietitian following.     Mobility: Encourage ambulation if able to follow commands Code Status:   Code Status: DNR  Nutritional status: Body mass index is 17.5 kg/m. Nutrition Problem: Severe Malnutrition Etiology: chronic illness (Rett syndrome) Signs/Symptoms: severe muscle depletion, severe fat depletion, percent weight loss (11.6% x 6 months) Percent weight loss: 11.6 % (x 6 months) Diet:  Diet Order             DIET - DYS 1 Room service appropriate? Yes; Fluid consistency: Thin  Diet effective now           Diet - low sodium heart healthy                  DVT prophylaxis:  enoxaparin (LOVENOX) injection 30 mg Start: 09/03/20 2200   Antimicrobials: None Fluid: Currently not on IV fluids Consultants: None Family Communication: None at bedside  Status is: Inpatient  Remains inpatient appropriate because: Difficult to place  Dispo: The patient is from: Home              Anticipated d/c is to: Facility discharge.  Difficult to place              Patient currently is medically stable to d/c.   Difficult to place patient Yes     Infusions:   promethazine (PHENERGAN) injection (IM or IVPB) Stopped (10/10/20 2013)    Scheduled Meds:  baclofen  10 mg Oral TID   docusate  100 mg Oral BID   enoxaparin (LOVENOX) injection  30 mg Subcutaneous Q24H   feeding supplement  1 Bottle Oral TID BM   ferrous sulfate  220  mg Oral Q breakfast   FLUoxetine  10 mg Oral Daily   LORazepam  1 mg Oral BID   OXcarbazepine  600 mg Oral BID   pantoprazole sodium  40 mg Oral Daily   polyethylene glycol  17 g Oral Daily   simethicone  40 mg Oral QID   tiaGABine  4 mg Oral BID   Vitamin D (Ergocalciferol)  50,000 Units Oral Q7 days    Antimicrobials: Anti-infectives (From admission, onward)    None       PRN meds: acetaminophen **OR** acetaminophen, bisacodyl, ondansetron **OR** ondansetron (ZOFRAN) IV,  promethazine (PHENERGAN) injection (IM or IVPB)   Objective: Vitals:   10/20/20 0532 10/20/20 0719  BP: 98/66 107/72  Pulse: 77 81  Resp: 16 16  Temp: (!) 97.4 F (36.3 C) 98 F (36.7 C)  SpO2: 99% 97%    Intake/Output Summary (Last 24 hours) at 10/20/2020 1059 Last data filed at 10/20/2020 1003 Gross per 24 hour  Intake 727 ml  Output --  Net 727 ml    Filed Weights   10/06/20 0500 10/09/20 0500 10/09/20 0938  Weight: 27.8 kg 40.8 kg 39.3 kg   Weight change:  Body mass index is 17.5 kg/m.   Physical Exam: General exam: Middle-aged Caucasian female. Not in distress Skin: No rashes, lesions or ulcers. HEENT: Atraumatic, normocephalic, no obvious bleeding Lungs: Clear to auscultation bilaterally CVS: Regular rate and rhythm, no murmur GI/Abd soft, nontender, nondistended, bowel sound present CNS: Alert, awake, unable to follow commands Psychiatry: Baseline chronic psychiatric issues Extremities: No pedal edema, no calf tenderness  Data Review: I have personally reviewed the laboratory data and studies available.  No results for input(s): WBC, NEUTROABS, HGB, HCT, MCV, PLT in the last 168 hours.  No results for input(s): NA, K, CL, CO2, GLUCOSE, BUN, CREATININE, CALCIUM, MG, PHOS in the last 168 hours.   F/u labs none Unresulted Labs (From admission, onward)    None       Signed, Lorin Glass, MD [facility] 10/20/2020

## 2020-10-20 NOTE — Plan of Care (Signed)

## 2020-10-21 DIAGNOSIS — R627 Adult failure to thrive: Secondary | ICD-10-CM | POA: Diagnosis not present

## 2020-10-21 NOTE — Progress Notes (Signed)
PROGRESS NOTE  Alicia Duncan  DOB: 07-01-1975  PCP: [name], MD MHD:[id number]  DOA: 09/03/2020  LOS: 46 days  Hospital Day: 46   Chief Complaint  Patient presents with   social work    Brief narrative: Alicia Duncan is a 45 y.o. female with PMH significant for Rett syndrome, cognitive impairment, developmental delay, nonverbal, seizure disorder and debility with complete dependence for ADLs. 5/30, patient was brought to ED by EMS due to unsafe home environment.  Patient's caregiver went on vacation for several days and family at home was not able to adequately take care of the patient.  Reportedly, patient's father ha advanced dementia and brother has schizophrenia.  Patient is medically stable but has no safe disposition yet.    Subjective: Patient was seen and examined this morning.   Alert, awake, in distress.  Unable to follow commands. 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   -Continue supportive care.   -Needs a safe discharge plan which TOC is working on.  Fecal impaction/ileus -Resolved. Continue bowel regimen  History of seizure disorder -7/8 seizure-like activity lasted 45 seconds as per RN, no postictal symptoms -Stable. -Continue Ativan, Trileptal, Gabitril.     Depression/anxiety -Stable.   -Continue fluoxetine, Ativan.   Candida vulvovaginitis -Status post nystatin.    Iron deficiency -Given IV Venofer in the hospital.  Oral supplement started.  Vitamin D insufficiency -started oral supplement.  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 -Patient with severe muscle depletion, severe fat depletion, percent weight loss 11.6% x 6 months. -Nutritional supplementation with Ensure  discontinued due to gaseous distention.   -Continue dysphagia 1 diet  -Dietitian following.     Mobility: Encourage ambulation if able to follow commands Code Status:   Code Status: DNR  Nutritional status: Body mass index is 17.5 kg/m. Nutrition Problem: Severe Malnutrition Etiology: chronic illness (Rett syndrome) Signs/Symptoms: severe muscle depletion, severe fat depletion, percent weight loss (11.6% x 6 months) Percent weight loss: 11.6 % (x 6 months) Diet:  Diet Order             DIET - DYS 1 Room service appropriate? Yes; Fluid consistency: Thin  Diet effective now           Diet - low sodium heart healthy                  DVT prophylaxis:  enoxaparin (LOVENOX) injection 30 mg Start: 09/03/20 2200   Antimicrobials: None Fluid: Currently not on IV fluids Consultants: None Family Communication: None at bedside  Status is: Inpatient  Remains inpatient appropriate because: Difficult to place  Dispo: The patient is from: Home              Anticipated d/c is to: Facility discharge.  Difficult to place              Patient currently is medically stable to d/c.   Difficult to place patient Yes     Infusions:   promethazine (PHENERGAN) injection (IM or IVPB) Stopped (10/10/20 2013)    Scheduled Meds:  baclofen  10 mg Oral TID   docusate  100 mg Oral BID   enoxaparin (LOVENOX) injection  30 mg Subcutaneous Q24H   feeding supplement  1 Bottle Oral TID BM   ferrous sulfate  220  mg Oral Q breakfast   FLUoxetine  10 mg Oral Daily   LORazepam  1 mg Oral BID   OXcarbazepine  600 mg Oral BID   pantoprazole sodium  40 mg Oral Daily   polyethylene glycol  17 g Oral Daily   simethicone  40 mg Oral QID   tiaGABine  4 mg Oral BID   Vitamin D (Ergocalciferol)  50,000 Units Oral Q7 days    Antimicrobials: Anti-infectives (From admission, onward)    None       PRN meds: acetaminophen **OR** acetaminophen, bisacodyl, ondansetron **OR** ondansetron (ZOFRAN) IV,  promethazine (PHENERGAN) injection (IM or IVPB)   Objective: Vitals:   10/21/20 0409 10/21/20 0759  BP: 112/78 96/77  Pulse: 91 90  Resp: 16 16  Temp: (!) 97.5 F (36.4 C) 98 F (36.7 C)  SpO2: 90% 98%    Intake/Output Summary (Last 24 hours) at 10/21/2020 0936 Last data filed at 10/20/2020 1844 Gross per 24 hour  Intake 480 ml  Output --  Net 480 ml   Filed Weights   10/06/20 0500 10/09/20 0500 10/09/20 0938  Weight: 27.8 kg 40.8 kg 39.3 kg   Weight change:  Body mass index is 17.5 kg/m.   Physical Exam: General exam: Middle-aged Caucasian female. Not in distress Skin: No rashes, lesions or ulcers. HEENT: Atraumatic, normocephalic, no obvious bleeding Lungs: Clear to auscultation bilaterally CVS: Regular rate and rhythm, no murmur GI/Abd soft, nontender, nondistended, bowel sound present CNS: Alert, awake, unable to follow commands Psychiatry: Baseline chronic psychiatric issues Extremities: No pedal edema, no calf tenderness  Data Review: I have personally reviewed the laboratory data and studies available.  No results for input(s): WBC, NEUTROABS, HGB, HCT, MCV, PLT in the last 168 hours.  No results for input(s): NA, K, CL, CO2, GLUCOSE, BUN, CREATININE, CALCIUM, MG, PHOS in the last 168 hours.   F/u labs none Unresulted Labs (From admission, onward)     Start     Ordered   10/22/20 0500  CBC with Differential/Platelet  Tomorrow morning,   R       Question:  Specimen collection method  Answer:  Lab=Lab collect   10/21/20 0936   10/22/20 0500  Basic metabolic panel  Tomorrow morning,   R       Question:  Specimen collection method  Answer:  Lab=Lab collect   10/21/20 0936            Signed, Lorin Glass, MD [facility] 10/21/2020

## 2020-10-22 ENCOUNTER — Inpatient Hospital Stay: Payer: Medicare Other

## 2020-10-22 DIAGNOSIS — R627 Adult failure to thrive: Secondary | ICD-10-CM | POA: Diagnosis not present

## 2020-10-22 LAB — CBC WITH DIFFERENTIAL/PLATELET
Abs Immature Granulocytes: 0.03 10*3/uL (ref 0.00–0.07)
Basophils Absolute: 0 10*3/uL (ref 0.0–0.1)
Basophils Relative: 1 %
Eosinophils Absolute: 0.2 10*3/uL (ref 0.0–0.5)
Eosinophils Relative: 3 %
HCT: 37 % (ref 36.0–46.0)
Hemoglobin: 11.7 g/dL — ABNORMAL LOW (ref 12.0–15.0)
Immature Granulocytes: 0 %
Lymphocytes Relative: 18 %
Lymphs Abs: 1.5 10*3/uL (ref 0.7–4.0)
MCH: 30.2 pg (ref 26.0–34.0)
MCHC: 31.6 g/dL (ref 30.0–36.0)
MCV: 95.4 fL (ref 80.0–100.0)
Monocytes Absolute: 0.4 10*3/uL (ref 0.1–1.0)
Monocytes Relative: 5 %
Neutro Abs: 6.1 10*3/uL (ref 1.7–7.7)
Neutrophils Relative %: 73 %
Platelets: 128 10*3/uL — ABNORMAL LOW (ref 150–400)
RBC: 3.88 MIL/uL (ref 3.87–5.11)
RDW: 20.2 % — ABNORMAL HIGH (ref 11.5–15.5)
WBC: 8.3 10*3/uL (ref 4.0–10.5)
nRBC: 0 % (ref 0.0–0.2)

## 2020-10-22 LAB — BASIC METABOLIC PANEL
Anion gap: 6 (ref 5–15)
BUN: 22 mg/dL — ABNORMAL HIGH (ref 6–20)
CO2: 26 mmol/L (ref 22–32)
Calcium: 8.5 mg/dL — ABNORMAL LOW (ref 8.9–10.3)
Chloride: 105 mmol/L (ref 98–111)
Creatinine, Ser: 0.46 mg/dL (ref 0.44–1.00)
GFR, Estimated: >60 mL/min (ref >60)
Glucose, Bld: 90 mg/dL (ref 70–99)
Potassium: 4 mmol/L (ref 3.5–5.1)
Sodium: 137 mmol/L (ref 135–145)

## 2020-10-22 MED ORDER — MORPHINE SULFATE (PF) 2 MG/ML IV SOLN: 0.5000 mg | INTRAVENOUS | Status: DC | PRN

## 2020-10-22 NOTE — Progress Notes (Signed)
PROGRESS NOTE  Alicia Duncan  DOB: October 08, 1975  PCP: [name], MD BSJ:[id number]  DOA: 09/03/2020  LOS: 47 days  Hospital Day: 50   Chief Complaint  Patient presents with   social work    Brief narrative: Alicia Duncan is a 45 y.o. female with PMH significant for Rett syndrome, cognitive impairment, developmental delay, nonverbal, seizure disorder and debility with complete dependence for ADLs. 5/30, patient was brought to ED by EMS due to unsafe home environment.  Patient's caregiver went on vacation for several days and family at home was not able to adequately take care of the patient.  Reportedly, patient's father ha advanced dementia and brother has schizophrenia.  Patient is medically stable but has no safe disposition yet.    Subjective: Patient was seen and examined this morning.   Alert, awake, in distress.  Unable to follow commands. 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   -Continue supportive care.   -Needs a safe discharge plan which TOC is working on.  Fecal impaction/ileus -Resolved. Continue bowel regimen  History of seizure disorder -7/8 seizure-like activity lasted 45 seconds as per RN, no postictal symptoms -Stable. -Continue Ativan, Trileptal, Gabitril.     Depression/anxiety -Stable.   -Continue fluoxetine, Ativan.   Candida vulvovaginitis -Status post nystatin.    Iron deficiency -Given IV Venofer in the hospital.  Oral supplement started.  Vitamin D insufficiency -started oral supplement.  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 -Patient with severe muscle depletion, severe fat depletion, percent weight loss 11.6% x 6 months. -Nutritional supplementation with Ensure  discontinued due to gaseous distention.   -Continue dysphagia 1 diet  -Dietitian following.     Mobility: Encourage ambulation if able to follow commands Code Status:   Code Status: DNR  Nutritional status: Body mass index is 17.5 kg/m. Nutrition Problem: Severe Malnutrition Etiology: chronic illness (Rett syndrome) Signs/Symptoms: severe muscle depletion, severe fat depletion, percent weight loss (11.6% x 6 months) Percent weight loss: 11.6 % (x 6 months) Diet:  Diet Order             DIET - DYS 1 Room service appropriate? Yes; Fluid consistency: Thin  Diet effective now           Diet - low sodium heart healthy                  DVT prophylaxis:  enoxaparin (LOVENOX) injection 30 mg Start: 09/03/20 2200   Antimicrobials: None Fluid: Currently not on IV fluids Consultants: None Family Communication: None at bedside  Status is: Inpatient  Remains inpatient appropriate because: Difficult to place  Dispo: The patient is from: Home              Anticipated d/c is to: Facility discharge.  Difficult to place              Patient currently is medically stable to d/c.   Difficult to place patient Yes     Infusions:   promethazine (PHENERGAN) injection (IM or IVPB) Stopped (10/10/20 2013)    Scheduled Meds:  baclofen  10 mg Oral TID   docusate  100 mg Oral BID   enoxaparin (LOVENOX) injection  30 mg Subcutaneous Q24H   feeding supplement  1 Bottle Oral TID BM   ferrous sulfate  220  mg Oral Q breakfast   FLUoxetine  10 mg Oral Daily   LORazepam  1 mg Oral BID   OXcarbazepine  600 mg Oral BID   pantoprazole sodium  40 mg Oral Daily   polyethylene glycol  17 g Oral Daily   simethicone  40 mg Oral QID   tiaGABine  4 mg Oral BID   Vitamin D (Ergocalciferol)  50,000 Units Oral Q7 days    Antimicrobials: Anti-infectives (From admission, onward)    None       PRN meds: acetaminophen **OR** acetaminophen, bisacodyl, ondansetron **OR** ondansetron (ZOFRAN) IV,  promethazine (PHENERGAN) injection (IM or IVPB)   Objective: Vitals:   10/22/20 0433 10/22/20 0752  BP: 102/68 116/70  Pulse: 78 86  Resp: 16 16  Temp: (!) 97.4 F (36.3 C) 97.6 F (36.4 C)  SpO2: 100% 98%    Intake/Output Summary (Last 24 hours) at 10/22/2020 1014 Last data filed at 10/22/2020 0300 Gross per 24 hour  Intake 247 ml  Output --  Net 247 ml    Filed Weights   10/06/20 0500 10/09/20 0500 10/09/20 0938  Weight: 27.8 kg 40.8 kg 39.3 kg   Weight change:  Body mass index is 17.5 kg/m.   Physical Exam: General exam: Middle-aged Caucasian female. Not in distress Skin: No rashes, lesions or ulcers. HEENT: Atraumatic, normocephalic, no obvious bleeding Lungs: Clear to auscultation bilaterally CVS: Regular rate and rhythm, no murmur GI/Abd soft, nontender, nondistended, bowel sound present CNS: Alert, awake, unable to follow commands Psychiatry: Baseline chronic psychiatric issues Extremities: No pedal edema, no calf tenderness  Data Review: I have personally reviewed the laboratory data and studies available.  Recent Labs  Lab 10/22/20 0451  WBC 8.3  NEUTROABS 6.1  HGB 11.7*  HCT 37.0  MCV 95.4  PLT 128*    Recent Labs  Lab 10/22/20 0451  NA 137  K 4.0  CL 105  CO2 26  GLUCOSE 90  BUN 22*  CREATININE 0.46  CALCIUM 8.5*     F/u labs none Unresulted Labs (From admission, onward)    None       Signed, Lorin Glass, MD [facility] 10/22/2020

## 2020-10-22 NOTE — Plan of Care (Signed)

## 2020-10-23 ENCOUNTER — Inpatient Hospital Stay: Payer: Medicare Other

## 2020-10-23 ENCOUNTER — Encounter: Payer: Self-pay | Admitting: Internal Medicine

## 2020-10-23 DIAGNOSIS — K567 Ileus, unspecified: Secondary | ICD-10-CM

## 2020-10-23 DIAGNOSIS — R627 Adult failure to thrive: Secondary | ICD-10-CM | POA: Diagnosis not present

## 2020-10-23 DIAGNOSIS — R140 Abdominal distension (gaseous): Secondary | ICD-10-CM

## 2020-10-23 DIAGNOSIS — R41841 Cognitive communication deficit: Secondary | ICD-10-CM

## 2020-10-23 DIAGNOSIS — G40909 Epilepsy, unspecified, not intractable, without status epilepticus: Secondary | ICD-10-CM | POA: Diagnosis not present

## 2020-10-23 DIAGNOSIS — F842 Rett's syndrome: Secondary | ICD-10-CM | POA: Diagnosis not present

## 2020-10-23 LAB — COMPREHENSIVE METABOLIC PANEL
ALT: 43 U/L (ref 0–44)
AST: 30 U/L (ref 15–41)
Albumin: 3.3 g/dL — ABNORMAL LOW (ref 3.5–5.0)
Alkaline Phosphatase: 37 U/L — ABNORMAL LOW (ref 38–126)
Anion gap: 7 (ref 5–15)
BUN: 17 mg/dL (ref 6–20)
CO2: 29 mmol/L (ref 22–32)
Calcium: 8.9 mg/dL (ref 8.9–10.3)
Chloride: 103 mmol/L (ref 98–111)
Creatinine, Ser: 0.47 mg/dL (ref 0.44–1.00)
GFR, Estimated: >60 mL/min (ref >60)
Glucose, Bld: 100 mg/dL — ABNORMAL HIGH (ref 70–99)
Potassium: 3.5 mmol/L (ref 3.5–5.1)
Sodium: 139 mmol/L (ref 135–145)
Total Bilirubin: 0.5 mg/dL (ref 0.3–1.2)
Total Protein: 6.5 g/dL (ref 6.5–8.1)

## 2020-10-23 LAB — URINALYSIS, ROUTINE W REFLEX MICROSCOPIC
Bacteria, UA: NONE SEEN
Bilirubin Urine: NEGATIVE
Glucose, UA: NEGATIVE mg/dL
Hgb urine dipstick: NEGATIVE
Ketones, ur: NEGATIVE mg/dL
Nitrite: NEGATIVE
Protein, ur: NEGATIVE mg/dL
Specific Gravity, Urine: 1.021 (ref 1.005–1.030)
pH: 7 (ref 5.0–8.0)

## 2020-10-23 LAB — FERRITIN: Ferritin: 235 ng/mL (ref 11–307)

## 2020-10-23 LAB — CBC WITH DIFFERENTIAL/PLATELET
Abs Immature Granulocytes: 0.02 10*3/uL (ref 0.00–0.07)
Basophils Absolute: 0 10*3/uL (ref 0.0–0.1)
Basophils Relative: 0 %
Eosinophils Absolute: 0 10*3/uL (ref 0.0–0.5)
Eosinophils Relative: 1 %
HCT: 39.4 % (ref 36.0–46.0)
Hemoglobin: 12.3 g/dL (ref 12.0–15.0)
Immature Granulocytes: 0 %
Lymphocytes Relative: 8 %
Lymphs Abs: 0.7 10*3/uL (ref 0.7–4.0)
MCH: 29.9 pg (ref 26.0–34.0)
MCHC: 31.2 g/dL (ref 30.0–36.0)
MCV: 95.6 fL (ref 80.0–100.0)
Monocytes Absolute: 0.7 10*3/uL (ref 0.1–1.0)
Monocytes Relative: 8 %
Neutro Abs: 7.2 10*3/uL (ref 1.7–7.7)
Neutrophils Relative %: 83 %
Platelets: 121 10*3/uL — ABNORMAL LOW (ref 150–400)
RBC: 4.12 MIL/uL (ref 3.87–5.11)
RDW: 20.3 % — ABNORMAL HIGH (ref 11.5–15.5)
WBC: 8.6 10*3/uL (ref 4.0–10.5)
nRBC: 0 % (ref 0.0–0.2)

## 2020-10-23 LAB — LACTIC ACID, PLASMA
Lactic Acid, Venous: 2.8 mmol/L (ref 0.5–1.9)
Lactic Acid, Venous: 1.9 mmol/L (ref 0.5–1.9)

## 2020-10-23 MED ORDER — SODIUM CHLORIDE 0.9 % IV SOLN
1.0000 g | INTRAVENOUS | Status: DC
  Administered 2020-10-23 – 2020-10-24 (×2): 1 g via INTRAVENOUS
  Filled 2020-10-23 (×2): qty 1

## 2020-10-23 MED ORDER — IOHEXOL 9 MG/ML PO SOLN
500.0000 mL | ORAL | Status: AC
  Administered 2020-10-23 (×2): 500 mL via ORAL

## 2020-10-23 MED ORDER — IOHEXOL 300 MG/ML  SOLN
75.0000 mL | Freq: Once | INTRAMUSCULAR | Status: AC | PRN
  Administered 2020-10-23: 15:00:00 75 mL via INTRAVENOUS

## 2020-10-23 MED ORDER — SODIUM CHLORIDE 0.9 % IV SOLN: INTRAVENOUS | Status: DC

## 2020-10-23 MED ORDER — MINERAL OIL RE ENEM
1.0000 | ENEMA | Freq: Once | RECTAL | Status: AC
  Administered 2020-10-23: 18:00:00 1 via RECTAL

## 2020-10-23 NOTE — Consult Note (Signed)
Cephas Darby, MD [address]  [place]  [place],  [address]  Main: [phone]  Fax: [phone] Pager: [phone]   Consultation  Referring Provider:     No ref. provider found Primary Care Physician:  Maryland Pink, MD Primary Gastroenterologist: Althia Forts       Reason for Consultation: Ileus  Date of Admission:  09/03/2020 Date of Consultation:  10/23/2020         HPI:   Alicia Duncan is a 45 y.o. female with history of Rett syndrome, severe cognitive disability, seizure disorder, totally dependent on ADLs, who has prolonged hospitalization secondary to inability to be taken care of at home and awaiting placement.  GI has been consulted to evaluate for ileus.  Patient had ileus secondary to constipation at the time of admission which improved with aggressive bowel regimen.  However, she had abdominal distention associated with fever and mildly elevated lactic acid.  She received IV fluids, lactic acidosis resolved.  KUB revealed gaseous bowel distention with no evidence of obstruction.  Patient underwent NG tube placement for oral contrast, had a CT abdomen and pelvis with contrast which revealed impacted stool in the rectum with rectal wall thickening and hyperemia.  According to patient's nurse, she has been having soft mushy bowel movements until yesterday.  Her tube feeds were held.  Patient is nonverbal.  When I saw the patient, she had audible bowel sounds bedside General surgery has also been consulted, recommended conservative management only at this time  NSAIDs: None  Antiplts/Anticoagulants/Anti thrombotics: None  GI Procedures: None  Past Medical History:  Diagnosis Date   Dystonia    Rett syndrome    Seizures ([place])     No past surgical history on file.  Prior to Admission medications   Medication Sig Start Date End Date Taking? Authorizing Provider  FLUoxetine (PROZAC) 10 MG capsule Take 10 mg by mouth daily. 10/28/19  Yes [provider]  LORazepam (ATIVAN) 1 MG tablet Take 1 tablet (1 mg total) by mouth 2 (two) times daily. 03/15/20  Yes Suzzanne Cloud, NP  omeprazole (PRILOSEC) 20 MG capsule Take 20 mg by mouth daily.   Yes [provider]  Oxcarbazepine (TRILEPTAL) 300 MG tablet Take 2 tablets (600 mg total) by mouth 2 (two) times daily. 03/15/20  Yes Suzzanne Cloud, NP  tiaGABine (GABITRIL) 4 MG tablet Take 1 tablet (4 mg total) by mouth 2 (two) times daily. 03/15/20  Yes Suzzanne Cloud, NP  baclofen (LIORESAL) 10 MG tablet Take 1 tablet (10 mg total) by mouth 3 (three) times daily. 09/11/20   Regalado, Belkys A, MD  docusate (COLACE) 50 MG/5ML liquid Take 10 mLs (100 mg total) by mouth 2 (two) times daily. 09/11/20   Regalado, Belkys A, MD  FLUoxetine (PROZAC) 20 MG/5ML solution Take 2.5 mLs (10 mg total) by mouth daily. 09/11/20   Regalado, Belkys A, MD  LORazepam (ATIVAN) 2 MG/ML concentrated solution Take 0.5 mLs (1 mg total) by mouth 2 (two) times daily. 09/11/20   Regalado, Belkys A, MD  nystatin cream (MYCOSTATIN) Apply topically 2 (two) times daily. 09/11/20   Regalado, Belkys A, MD  OXcarbazepine (TRILEPTAL) 300 MG/5ML suspension Take 10 mLs (600 mg total) by mouth 2 (two) times daily. 09/11/20   Regalado, Belkys A, MD  tiaGABine (GABITRIL) 4 MG tablet Take 1 tablet (4 mg total) by mouth 2 (two) times daily. 09/11/20   Regalado, Cassie Freer, MD   Current Facility-Administered Medications:  0.9 %  sodium chloride infusion, , Intravenous, Continuous, Dahal, Binaya, MD, Last Rate: 125 mL/hr at 10/23/20 1812, New Bag at 10/23/20 1812   acetaminophen (TYLENOL) tablet 650 mg, 650 mg, Oral, Q6H PRN, 650 mg at 10/23/20 4967 **OR** acetaminophen (TYLENOL) suppository 650 mg, 650 mg, Rectal, Q6H PRN, Shanlever, Pierce Crane, RPH   baclofen (LIORESAL) tablet 10 mg, 10 mg, Oral, TID, Shanlever, Pierce Crane, RPH, 10 mg at 10/23/20 1733   bisacodyl (DULCOLAX) suppository 10 mg, 10 mg, Rectal, Daily PRN, Vann, Jessica U, DO, 10 mg at  09/30/20 2039   cefTRIAXone (ROCEPHIN) 1 g in sodium chloride 0.9 % 100 mL IVPB, 1 g, Intravenous, Q24H, Dahal, Binaya, MD, Stopped at 10/23/20 1158   docusate (COLACE) 50 MG/5ML liquid 100 mg, 100 mg, Oral, BID, Shanlever, Pierce Crane, RPH, 100 mg at 10/23/20 0908   enoxaparin (LOVENOX) injection 30 mg, 30 mg, Subcutaneous, Q24H, Posey Pronto, Vishal R, MD, 30 mg at 10/22/20 2116   feeding supplement (ENSURE ENLIVE / ENSURE PLUS) liquid 237 mL, 1 Bottle, Oral, TID BM, Val Riles, MD, 237 mL at 10/23/20 0909   ferrous sulfate 220 (44 Fe) MG/5ML solution 220 mg, 220 mg, Oral, Q breakfast, Dahal, Binaya, MD, 220 mg at 10/23/20 0911   FLUoxetine (PROZAC) 20 MG/5ML solution 10 mg, 10 mg, Oral, Daily, Shanlever, Pierce Crane, RPH, 10 mg at 10/23/20 0909   LORazepam (ATIVAN) 2 MG/ML concentrated solution 1 mg, 1 mg, Oral, BID, Shanlever, Pierce Crane, RPH, 1 mg at 10/23/20 0909   morphine 2 MG/ML injection 0.5 mg, 0.5 mg, Intravenous, Q4H PRN, Dahal, Binaya, MD   ondansetron (ZOFRAN) tablet 4 mg, 4 mg, Oral, Q6H PRN **OR** ondansetron (ZOFRAN) injection 4 mg, 4 mg, Intravenous, Q6H PRN, Shanlever, Pierce Crane, RPH, 4 mg at 10/22/20 1332   OXcarbazepine (TRILEPTAL) 300 MG/5ML suspension 600 mg, 600 mg, Oral, BID, Shanlever, Pierce Crane, RPH, 600 mg at 10/23/20 0910   pantoprazole sodium (PROTONIX) 40 mg/20 mL oral suspension 40 mg, 40 mg, Oral, Daily, Shanlever, Pierce Crane, RPH, 40 mg at 10/22/20 2113   polyethylene glycol (MIRALAX / GLYCOLAX) packet 17 g, 17 g, Oral, Daily, Lu Duffel, RPH, 17 g at 10/23/20 0910   promethazine (PHENERGAN) 12.5 mg in sodium chloride 0.9 % 50 mL IVPB, 12.5 mg, Intravenous, Q8H PRN, Wendee Beavers T, MD, Stopped at 10/22/20 1608   simethicone (MYLICON) 40 RF/1.6BW suspension 40 mg, 40 mg, Oral, QID, Eugenie Filler, MD, 40 mg at 10/23/20 1734   tiaGABine (GABITRIL) tablet 4 mg, 4 mg, Oral, BID, Belue, Alver Sorrow, RPH, 4 mg at 10/23/20 4665   Vitamin D (Ergocalciferol) (DRISDOL)  capsule 50,000 Units, 50,000 Units, Oral, Q7 days, Val Riles, MD, 50,000 Units at 10/18/20 9935   Family History  Problem Relation Age of Onset   Seizures Mother    Memory loss Father    Diverticulosis Father    Schizophrenia Brother      Social History   Tobacco Use   Smoking status: Never   Smokeless tobacco: Never  Substance Use Topics   Alcohol use: No   Drug use: No    Allergies as of 09/03/2020 - Review Complete 09/03/2020  Allergen Reaction Noted   Benadryl [diphenhydramine]  04/25/2013   Penicillins Other (See Comments) 08/05/2011    Review of Systems:    All systems reviewed and negative except where noted in HPI.   Physical Exam:  Vital signs in last 24 hours: Temp:  [97.6 F (36.4 C)-101 F (38.3  C)] 97.9 F (36.6 C) (07/19 1554) Pulse Rate:  [83-121] 97 (07/19 1556) Resp:  [15-17] 17 (07/19 1554) BP: (88-114)/(46-72) 88/56 (07/19 1556) SpO2:  [91 %-100 %] 98 % (07/19 1554) Last BM Date: 10/22/20 General:   Nonverbal, thin built Head:  Normocephalic and atraumatic. Eyes:   No icterus.   Conjunctiva pink. PERRLA. Ears:  Normal auditory acuity. Neck:  Supple; no masses or thyroidomegaly Lungs: Respirations even and unlabored. Lungs clear to auscultation bilaterally.   No wheezes, crackles, or rhonchi.  Heart:  Regular rate and rhythm;  Without murmur, clicks, rubs or gallops Abdomen:  Soft, diffusely distended, tympanic to percussion, not tense, nontender.  Hyperactive bowel sounds. No appreciable masses or hepatomegaly.  No rebound or guarding.  Rectal:  Not performed. Msk:  Symmetrical without gross deformities.  Muscle wasting Extremities:  Without edema, cyanosis or clubbing. Neurologic:  Alert and oriented x0;   Skin:  Intact without significant lesions or rashes.   LAB RESULTS: CBC Latest Ref Rng & Units 10/23/2020 10/22/2020 10/10/2020  WBC 4.0 - 10.5 K/uL 8.6 8.3 5.6  Hemoglobin 12.0 - 15.0 g/dL 12.3 11.7(L) 10.6(L)  Hematocrit 36.0 - 46.0  % 39.4 37.0 33.5(L)  Platelets 150 - 400 K/uL 121(L) 128(L) 162    BMET BMP Latest Ref Rng & Units 10/23/2020 10/22/2020 10/10/2020  Glucose 70 - 99 mg/dL 100(H) 90 73  BUN 6 - 20 mg/dL 17 22(H) 18  Creatinine 0.44 - 1.00 mg/dL 0.47 0.46 0.31(L)  BUN/Creat Ratio 9 - 23 - - -  Sodium 135 - 145 mmol/L 139 137 138  Potassium 3.5 - 5.1 mmol/L 3.5 4.0 3.6  Chloride 98 - 111 mmol/L 103 105 107  CO2 22 - 32 mmol/L _0 Calcium 8.9 - 10.3 mg/dL 8.9 8.5(L) 8.3(L)    LFT Hepatic Function Latest Ref Rng & Units 10/23/2020 10/02/2020 09/24/2020  Total Protein 6.5 - 8.1 g/dL 6.5 - 6.8  Albumin 3.5 - 5.0 g/dL 3.3(L) 2.7(L) 3.4(L)  AST 15 - 41 U/L 30 - 38  ALT 0 - 44 U/L 43 - 58(H)  Alk Phosphatase 38 - 126 U/L 37(L) - 38  Total Bilirubin 0.3 - 1.2 mg/dL 0.5 - 0.4     STUDIES: DG Abd 1 View  Result Date: 10/23/2020 CLINICAL DATA:  NG tube placement. EXAM: ABDOMEN - 1 VIEW COMPARISON:  Chest x-ray 10/23/2020. Abdomen 10/22/2020. CT 09/24/2020. FINDINGS: NG tube noted with tip over the stomach. Moderate gastric distention. Mild small-bowel and colonic distention again noted. No free air. Stable right rib deformities. Prior thoracolumbar lumbosacral spine fusion. IMPRESSION: NG tube noted with tip over the stomach. Moderate gastric distention. Mild small-bowel and colonic distention again noted. Electronically Signed   By: Marcello Moores  Register   On: 10/23/2020 13:27   DG Chest Port 1 View  Result Date: 10/23/2020 CLINICAL DATA:  Fevers EXAM: PORTABLE CHEST 1 VIEW COMPARISON:  09/24/2020 FINDINGS: Cardiac shadow is stable. Poor inspiratory effort crowds the vascular markings. No focal infiltrate or effusion is seen. Scoliosis rods are again seen and stable. Old right healed clavicular fracture is noted. IMPRESSION: No acute abnormality is noted. Electronically Signed   By: Inez Catalina M.D.   On: 10/23/2020 08:25   DG Abd Portable 2V  Result Date: 10/22/2020 CLINICAL DATA:  Pain. Rule out small bowel  obstruction. Stool burden. EXAM: PORTABLE ABDOMEN - 2 VIEW COMPARISON:  Most recent radiograph 10/02/2020 FINDINGS: Similar diffuse gaseous distension of small and large bowel throughout the abdomen. Small volume  of formed stool in the sigmoid colon, no other significant formed stool is seen. There may be mild gaseous gastric distension. No obvious free air on these supine portable views. Extensive thoracolumbar spinal fusion hardware. IMPRESSION: Similar diffuse gaseous distension of small and large bowel throughout the abdomen, favor ileus. Small volume of formed stool in the sigmoid colon. Electronically Signed   By: Keith Rake M.D.   On: 10/22/2020 15:56      Impression / Plan:   Alicia Duncan is a 45 y.o. female with Rett syndrome, seizure disorder, severe cognitive impairment.  Patient has ileus secondary to severe constipation and impaired mobility and possible fecal impaction based on the CT scan today.  I have communicated with the nurse to attempt manual disimpaction, however nurse did not feel any stool in the rectal vault.  I have advised to give mineral oil enema and possibly a Dulcolax suppository.  Okay to resume tube feeds as tolerated.  She may also have bacterial overgrowth recommend rifaximin 550 mg 3 times daily for 2 weeks  Thank you for involving me in the care of this patient.      LOS: 48 days   Sherri Sear, MD  10/23/2020, 4:22 PM    Note: This dictation was prepared with Dragon dictation along with smaller phrase technology. Any transcriptional errors that result from this process are unintentional.

## 2020-10-23 NOTE — Progress Notes (Signed)
   10/23/20 1443  Assess: MEWS Score  Temp (!) 101 F (38.3 C)  BP 105/65  Pulse Rate (!) 121  Resp 16  SpO2 93 %  O2 Device Room Air  Assess: MEWS Score  MEWS Temp 1  MEWS Systolic 0  MEWS Pulse 2  MEWS RR 0  MEWS LOC 0  MEWS Score 3  MEWS Score Color Yellow  Assess: if the MEWS score is Yellow or Red  Were vital signs taken at a resting state? Yes  Focused Assessment Change from prior assessment (see assessment flowsheet)  Does the patient meet 2 or more of the SIRS criteria? Yes  Does the patient have a confirmed or suspected source of infection? No  Provider and Rapid Response Notified? Yes  Early Detection of Sepsis Score *See Row Information* Low  MEWS guidelines implemented *See Row Information* Yes  Treat  MEWS Interventions Administered prn meds/treatments  Notify: Charge Nurse/RN  Name of Charge Nurse/RN Notified Deniece Ree, RN  Date Charge Nurse/RN Notified 10/23/20  Time Charge Nurse/RN Notified 0617  Notify: Provider  Provider Name/Title Andrez Grime  Date Provider Notified 10/23/20  Time Provider Notified [phone]  Notification Type  (secure chat)  Notification Reason Change in status  Provider response No new orders  Date of Provider Response 10/23/20  Time of Provider Response [phone]  Document  Patient Outcome Other (Comment) (prn medication given for temp. will continue to assess)  Assess: SIRS CRITERIA  SIRS Temperature  1  SIRS Pulse 1  SIRS Respirations  0  SIRS WBC 0  SIRS Score Sum  2

## 2020-10-23 NOTE — Progress Notes (Signed)
Dr. Pola Corn made aware of critical lactic acid 2.8, acknowledged new orders placed.

## 2020-10-23 NOTE — TOC Progression Note (Addendum)
Transition of Care [facility]) - Progression Note    Patient Details  Name: Alicia Duncan MRN: [id number] Date of Birth: 1975-11-30  [facility]) CM/SW Contact  Hetty Ely, RN Phone Number: 10/23/2020, 2:23 PM  Clinical Narrative:  Per Attending during rounds this morning patient is now Septic and not medically stable for discharge. Will attempt to insert NG tube today.   2:30pm. Called and spoke to DSS [name] [phone], who reports that patient will be moving to ALF, [facility] in [place] on 11/01/20. Family is aware, brother Genelle Bal notified. QP of facility is [facility]ie [facility] [phone].    Expected Discharge Plan: Group Home Barriers to Discharge: Other (must enter comment) (patient needs a specialized care home placement)  Expected Discharge Plan and Services Expected Discharge Plan: Group Home   Discharge Planning Services: CM Consult Post Acute Care Choice: Nursing Home Living arrangements for the past 2 months: Single Family Home Expected Discharge Date: 09/13/20               DME Arranged: N/A DME Agency: NA         HH Agency: NA         Social Determinants of Health (SDOH) Interventions    Readmission Risk Interventions No flowsheet data found.

## 2020-10-23 NOTE — Progress Notes (Signed)
[facility] SURGICAL PROGRESS NOTE (cpt [phone])  Hospital Day(s): 48.   Interval History:  Alicia Duncan is known to our service follow initial consult on 06/20 for concerns over potential ileus; however, seemed this was more attributable to constipation. She was started on aggressive bowel regimen, and seemed to do well. She has remained in the hospital secondary to her severe cognitive baseline deficits and inability to find safe disposition plan for her.   We are being asked to see the patient again for similar.  Patient with history of Rett Syndrome, severe cognitive disability, seizure disorder, and is nonverbal at baseline. She is unable to contribute to history, No family present. Per chart review, that in the last 24 hours, she has developed fever to 101 this morning at 0600. Her abdomen has remained distended, but, she does appear to have tolerated her typical feeding this morning. KUB was obtained concerning for gaseous bowel distension. Her CBC is reassuring, without leukocytosis. BMP is also reassuring. She did have a mild lactic acidosis to 2.8. She does have recorded bowel function in the last 24 hours.   Review of Systems:  Unable to preform secondary to cognitive deficits, nonverbal    Vital signs in last 24 hours: [min-max] current  Temp:  [97.6 F (36.4 C)-101 F (38.3 C)] 98.5 F (36.9 C) (07/19 0803) Pulse Rate:  [82-121] 87 (07/19 0804) Resp:  [15-17] 15 (07/19 0803) BP: (95-112)/(55-69) 112/67 (07/19 0804) SpO2:  [91 %-100 %] 93 % (07/19 0803)     Height: 4\' 11"  (149.9 cm) Weight: 39.3 kg (bed weight) BMI (Calculated): 13.48   Intake/Output last 2 shifts:  No intake/output data recorded.   Physical Exam:  Constitutional: alert, no distress, nonverbal HENT: normocephalic without obvious abnormality  Respiratory: breathing non-labored at rest  Cardiovascular: regular rate and sinus rhythm  Gastrointestinal: Soft, does not appear tender but again  exam is limited given her baseline, she is tympanic and distended, no evidence of peritonitis Musculoskeletal: chronic contractures, no edema or wounds   Labs:  CBC Latest Ref Rng & Units 10/23/2020 10/22/2020 10/10/2020  WBC 4.0 - 10.5 K/uL 8.6 8.3 5.6  Hemoglobin 12.0 - 15.0 g/dL 12/11/2020 11.7(L) 10.6(L)  Hematocrit 36.0 - 46.0 % 39.4 37.0 33.5(L)  Platelets 150 - 400 K/uL 121(L) 128(L) 162   CMP Latest Ref Rng & Units 10/23/2020 10/22/2020 10/10/2020  Glucose 70 - 99 mg/dL 12/11/2020) 90 73  BUN 6 - 20 mg/dL 17 756(E) 18  Creatinine 0.44 - 1.00 mg/dL 33(I 9.51 8.84)  Sodium 135 - 145 mmol/L 139 137 138  Potassium 3.5 - 5.1 mmol/L 3.5 4.0 3.6  Chloride 98 - 111 mmol/L 103 105 107  CO2 22 - 32 mmol/L 29 26 26   Calcium 8.9 - 10.3 mg/dL 8.9 1.66(A) 8.3(L)  Total Protein 6.5 - 8.1 g/dL 6.5 - -  Total Bilirubin 0.3 - 1.2 mg/dL 0.5 - -  Alkaline Phos 38 - 126 U/L 37(L) - -  AST 15 - 41 U/L 30 - -  ALT 0 - 44 U/L 43 - -     Imaging studies:   KUB (10/22/2020) personally reviewed with gaseous distension of small bowel and colon, no gross free air, and radiologist report reviewed below:  IMPRESSION: Similar diffuse gaseous distension of small and large bowel throughout the abdomen, favor ileus. Small volume of formed stool in the sigmoid colon.    Assessment/Plan: (ICD-10's: K11.7) 45 y.o. female with abdominal distension most likely secondary to ileus vs underlying  functional disorder/dysmotility, complicated by pertinent comorbidities including Rett syndrome, severe cognitive disability, seizure disorder, and nonverbal at baseline.   - Will get CT Abdomen/Pelvis to better evaluate and ensure no missed intra-abdominal pathology - Ideally, she should get NGT decompression, but I suspect she will have challenges with this - Also may be of benefit to involve GI to evaluate for functional motility disorders - NPO for now   - Monitor abdominal examination; on-going bowel function - No surgical  intervention warranted - Further management per primary service   All of the above findings and recommendations were discussed with the medical team.   -- Lynden Oxford, PA-C [facility] 10/23/2020, 10:55 AM [phone] M-F: 7am - 4pm

## 2020-10-23 NOTE — Progress Notes (Signed)
OK to use NG tube per Dr Pola Corn

## 2020-10-23 NOTE — Progress Notes (Signed)
Dr Pola Corn and Dr Allegra Lai made aware via message that abd CT result is available in computer

## 2020-10-23 NOTE — Progress Notes (Signed)
PROGRESS NOTE  ZEYNA MKRTCHYAN  DOB: 1976-02-23  PCP: [name], MD BOF:[id number]  DOA: 09/03/2020  LOS: 48 days  Hospital Day: 60   Chief Complaint  Patient presents with   social work    Brief narrative: ESME FREUND is a 45 y.o. female with PMH significant for Rett syndrome, cognitive impairment, developmental delay, nonverbal, seizure disorder and debility with complete dependence for ADLs. 5/30, patient was brought to ED by EMS due to unsafe home environment.  Patient's caregiver went on vacation for several days and family at home was not able to adequately take care of the patient.  Reportedly, patient's father ha advanced dementia and brother has schizophrenia.  Patient is currently waiting for appropriate placement.  Subjective: Patient was seen and examined this morning.    7/19, patient had a fever this morning of 101. Yesterday she was complaining of abdominal pain.  Abdominal x-ray showed ileus. At the time of my evaluation this morning, patient is feeding herself from a bottle.  Alert, awake, unable to have a conversation which is her baseline.  Not grimacing.  Abdomen distended but not tender. Labs sent this morning including culture.  Lactic acid elevated to 2.8.  Assessment/Plan: Sepsis - on 7/19 -With fever 101, tachycardia, lactic acidosis.  -Chest x-ray normal. -Unclear source of infection at this time, also sent for CBC, BMP, blood culture and urinalysis -Started on IV Rocephin empirically. -She is very thin built with baseline low blood pressure.  Blood pressure currently running in 90s and low 100s.  Does not seem hemodynamically unstable at this time.  I do think she needs to be bolused fluid.  I would start maintenance IV fluid with normal saline at 125 mill per hour. -Repeat lactic acid this afternoon. Recent Labs  Lab 10/22/20 0451 10/23/20 0918  WBC 8.3 8.6  LATICACIDVEN  --  2.8*   Abdominal distention -7/18, abdominal x-ray showed  diffuse gaseous distention of small and large bowel throughout the abdomen suggestive of ileus.  Small volume of formed stool in the sigmoid colon.  Gaseous distention is similar to the x-ray finding from 6/28.  But the stool burden has gotten better. -Discussed with surgeon Dr. Everlene Farrier who will see her as a consult today.  Failure to thrive Rett syndrome with cognitive disability Developmental delay/nonverbal with oral phase dysphagia at baseline -requires total care -Continue current dysphagia 1 diet with aspiration precautions.   -Continue supportive care.   -Needs a safe discharge plan which TOC is working on.  History of seizure disorder -7/8 seizure-like activity lasted 45 seconds as per RN, no postictal symptoms -Stable. -Continue Ativan, Trileptal, Gabitril.     Depression/anxiety -Stable.   -Continue fluoxetine, Ativan.   Candida vulvovaginitis -Status post nystatin.    Iron deficiency -Given IV Venofer in the hospital.  Oral supplement started.  Vitamin D insufficiency -started oral supplement.  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 -Patient with severe muscle depletion, severe fat depletion, percent weight loss 11.6% x 6 months. -Nutritional supplementation with Ensure discontinued due to gaseous distention.   -Continue dysphagia 1 diet  -Dietitian following.     Mobility: Encourage ambulation if able to follow commands Code Status:   Code Status: DNR  Nutritional status: Body mass index is 17.5 kg/m. Nutrition Problem: Severe Malnutrition Etiology: chronic illness (Rett syndrome) Signs/Symptoms: severe muscle depletion, severe fat depletion, percent weight loss (11.6% x 6 months) Percent weight loss: 11.6 % (x 6 months) Diet:  Diet Order  DIET - DYS 1 Room service appropriate? Yes; Fluid consistency: Thin  Diet effective now           Diet - low sodium heart healthy                  DVT prophylaxis:   enoxaparin (LOVENOX) injection 30 mg Start: 09/03/20 2200   Antimicrobials: IV Rocephin Fluid: Normal saline at 125 mill per hour Consultants: General surgery called on 7/19 Family Communication: None at bedside  Status is: Inpatient  Remains inpatient appropriate because: Pending placement. New sepsis.  Dispo: The patient is from: Home              Anticipated d/c is to: Facility discharge.  Difficult to place              Patient currently is medically stable to d/c.   Difficult to place patient Yes     Infusions:   sodium chloride     cefTRIAXone (ROCEPHIN)  IV     promethazine (PHENERGAN) injection (IM or IVPB) Stopped (10/22/20 2000)    Scheduled Meds:  baclofen  10 mg Oral TID   docusate  100 mg Oral BID   enoxaparin (LOVENOX) injection  30 mg Subcutaneous Q24H   feeding supplement  1 Bottle Oral TID BM   ferrous sulfate  220 mg Oral Q breakfast   FLUoxetine  10 mg Oral Daily   LORazepam  1 mg Oral BID   OXcarbazepine  600 mg Oral BID   pantoprazole sodium  40 mg Oral Daily   polyethylene glycol  17 g Oral Daily   simethicone  40 mg Oral QID   tiaGABine  4 mg Oral BID   Vitamin D (Ergocalciferol)  50,000 Units Oral Q7 days    Antimicrobials: Anti-infectives (From admission, onward)    Start     Dose/Rate Route Frequency Ordered Stop   10/23/20 1130  cefTRIAXone (ROCEPHIN) 1 g in sodium chloride 0.9 % 100 mL IVPB        1 g 200 mL/hr over 30 Minutes Intravenous Every 24 hours 10/23/20 1030         PRN meds: acetaminophen **OR** acetaminophen, bisacodyl, morphine injection, ondansetron **OR** ondansetron (ZOFRAN) IV, promethazine (PHENERGAN) injection (IM or IVPB)   Objective: Vitals:   10/23/20 0803 10/23/20 0804  BP: 97/67 112/67  Pulse: 98 87  Resp: 15   Temp: 98.5 F (36.9 C)   SpO2: 93%     Intake/Output Summary (Last 24 hours) at 10/23/2020 1030 Last data filed at 10/22/2020 1833 Gross per 24 hour  Intake 0 ml  Output --  Net 0 ml    Filed Weights   10/06/20 0500 10/09/20 0500 10/09/20 0938  Weight: 27.8 kg 40.8 kg 39.3 kg   Weight change:  Body mass index is 17.5 kg/m.   Physical Exam: General exam: Middle-aged Caucasian female. Not in distress Skin: No rashes, lesions or ulcers. HEENT: Atraumatic, normocephalic, no obvious bleeding Lungs: Clear to auscultation bilaterally CVS: Regular rate and rhythm, no murmur GI/Abd soft, diffusely distended, bowel sound present, nontender CNS: Alert, awake, unable to follow commands Psychiatry: Baseline chronic psychiatric issues Extremities: No pedal edema, no calf tenderness  Data Review: I have personally reviewed the laboratory data and studies available.  Recent Labs  Lab 10/22/20 0451 10/23/20 0918  WBC 8.3 8.6  NEUTROABS 6.1 7.2  HGB 11.7* 12.3  HCT 37.0 39.4  MCV 95.4 95.6  PLT 128* 121*    Recent Labs  Lab 10/22/20 0451 10/23/20 0918  NA 137 139  K 4.0 3.5  CL 105 103  CO2 26 29  GLUCOSE 90 100*  BUN 22* 17  CREATININE 0.46 0.47  CALCIUM 8.5* 8.9     F/u labs none Unresulted Labs (From admission, onward)     Start     Ordered   10/23/20 1400  Lactic acid, plasma  Once-Timed,   TIMED       Question:  Specimen collection method  Answer:  Lab=Lab collect   10/23/20 1028   10/23/20 0746  Culture, blood (Routine X 2) w Reflex to ID Panel  BLOOD CULTURE X 2,   TIMED      10/23/20 0745            Signed, Lorin Glass, MD [facility] 10/23/2020

## 2020-10-24 DIAGNOSIS — R627 Adult failure to thrive: Secondary | ICD-10-CM | POA: Diagnosis not present

## 2020-10-24 DIAGNOSIS — F842 Rett's syndrome: Secondary | ICD-10-CM | POA: Diagnosis not present

## 2020-10-24 DIAGNOSIS — R41841 Cognitive communication deficit: Secondary | ICD-10-CM | POA: Diagnosis not present

## 2020-10-24 DIAGNOSIS — K5641 Fecal impaction: Secondary | ICD-10-CM | POA: Diagnosis not present

## 2020-10-24 DIAGNOSIS — R140 Abdominal distension (gaseous): Secondary | ICD-10-CM | POA: Diagnosis not present

## 2020-10-24 DIAGNOSIS — K5900 Constipation, unspecified: Secondary | ICD-10-CM | POA: Diagnosis not present

## 2020-10-24 LAB — CBC WITH DIFFERENTIAL/PLATELET
Abs Immature Granulocytes: 0.05 10*3/uL (ref 0.00–0.07)
Basophils Absolute: 0 10*3/uL (ref 0.0–0.1)
Basophils Relative: 1 %
Eosinophils Absolute: 0.2 10*3/uL (ref 0.0–0.5)
Eosinophils Relative: 4 %
HCT: 37.4 % (ref 36.0–46.0)
Hemoglobin: 12.1 g/dL (ref 12.0–15.0)
Immature Granulocytes: 1 %
Lymphocytes Relative: 19 %
Lymphs Abs: 1.1 10*3/uL (ref 0.7–4.0)
MCH: 29.7 pg (ref 26.0–34.0)
MCHC: 32.4 g/dL (ref 30.0–36.0)
MCV: 91.7 fL (ref 80.0–100.0)
Monocytes Absolute: 0.5 10*3/uL (ref 0.1–1.0)
Monocytes Relative: 8 %
Neutro Abs: 4.1 10*3/uL (ref 1.7–7.7)
Neutrophils Relative %: 67 %
Platelets: 88 10*3/uL — ABNORMAL LOW (ref 150–400)
RBC: 4.08 MIL/uL (ref 3.87–5.11)
RDW: 19.5 % — ABNORMAL HIGH (ref 11.5–15.5)
WBC: 5.9 10*3/uL (ref 4.0–10.5)
nRBC: 0 % (ref 0.0–0.2)

## 2020-10-24 MED ORDER — MAGNESIUM CITRATE PO SOLN
1.0000 | Freq: Once | ORAL | Status: AC
  Administered 2020-10-24: 1 via ORAL
  Filled 2020-10-24: qty 296

## 2020-10-24 MED ORDER — SODIUM CHLORIDE 0.9 % IV SOLN
1.0000 g | INTRAVENOUS | Status: AC
  Administered 2020-10-25 – 2020-10-27 (×3): 1 g via INTRAVENOUS
  Filled 2020-10-24 (×3): qty 1

## 2020-10-24 MED ORDER — RIFAXIMIN 550 MG PO TABS
550.0000 mg | ORAL_TABLET | Freq: Three times a day (TID) | ORAL | Status: DC
  Administered 2020-10-24 – 2020-11-01 (×23): 550 mg via ORAL
  Filled 2020-10-24 (×23): qty 1

## 2020-10-24 MED ORDER — MINERAL OIL RE ENEM
1.0000 | ENEMA | Freq: Once | RECTAL | Status: AC
  Administered 2020-10-24: 1 via RECTAL

## 2020-10-24 MED ORDER — POLYETHYLENE GLYCOL 3350 17 G PO PACK
17.0000 g | PACK | Freq: Two times a day (BID) | ORAL | Status: DC
  Administered 2020-10-24 – 2020-11-01 (×15): 17 g via ORAL
  Filled 2020-10-24 (×15): qty 1

## 2020-10-24 NOTE — Progress Notes (Signed)
PROGRESS NOTE  Alicia Duncan  UJW:[id number] DOB: 11/14/1975 DOA: 09/03/2020 PCP: Alicia Duncan   Brief Narrative: Alicia Duncan is a 45 y.o. female with PMH significant for Rett syndrome, cognitive impairment, developmental delay, nonverbal, seizure disorder and debility with complete dependence for ADLs. 5/30, patient was brought to ED by EMS due to unsafe home environment.  Patient's caregiver went on vacation for several days and family at home was not able to adequately take care of the patient.  Reportedly, patient's father ha advanced dementia and brother has schizophrenia.  Patient is currently waiting for appropriate placement.  Assessment & Plan: Principal Problem:   Failure to thrive in adult Active Problems:   Rett syndrome   Seizure disorder (HCC)   Severe protein-calorie malnutrition (HCC)   Constipation   Vaginal candidiasis   Fecal impaction (HCC)   Depression  Sepsis due to aspiration pneumonia: CT with R > LLL opacities and pt with known dysphagia: On 7/19 (not POA) with fever 101, tachycardia, lactic acidosis. - Continue ceftriaxone x5 days. If leukocytosis develops, consider addition of flagyl. Pt has PCN allergy.  - Lactic acid cleared, BP stabilized during day. Will stop IVF for now  Ileus with fecal impaction: Suspected encopresis.  - Surgery and GI consulted. Will follow recommendations for mineral oil enemas, dulcolax suppository, and rifaximin 550 mg TID x 2 weeks(possible bacterial overgrowth).     Failure to thrive Rett syndrome with cognitive disability Developmental delay/nonverbal with oral phase dysphagia at baseline - Requires total care - Continue dysphagia 1 diet with aspiration precautions.   - Continue supportive care.   - Needs a safe discharge plan which TOC is working on.   Seizure disorder: 7/8 seizure-like activity lasted 45 seconds as per RN, no postictal symptoms. No recurrence currently.  - Continue ativan, trileptal,  gabitril.     Depression/anxiety - Continue fluoxetine, ativan.   Candida vulvovaginitis: Treated with nystatin.    Iron deficiency: Given IV Venofer while hospitalized.  - Will continue enteric supplementation.    Vitamin D insufficiency - Supplement weekly.   Severe protein calorie malnutrition: Likely secondary to Rett syndrome. Patient with severe muscle depletion, severe fat depletion, percent weight loss 11.6% x 6 months. - Nutritional supplementation with Ensure discontinued due to gaseous distention. Continue tube feeds.  - Continue dysphagia 1 diet  - Dietitian following.    Thrombocytopenia: Chronic issue, but worsened today.  - Monitor CBC in AM and avoid VTE ppx if plt <50k.  DVT prophylaxis: Lovenox 30mg  Code Status: DNR Family Communication: None at bedside Disposition Plan:  Status is: Inpatient  Remains inpatient appropriate because:Inpatient level of care appropriate due to severity of illness  Dispo: The patient is from: Home              Anticipated d/c is to: ALF              Patient currently is not medically stable to d/c.   Difficult to place patient Yes       Consultants:  Surgery GI  Procedures:  None  Antimicrobials: Ceftriaxone 7/19 - 7/13 Rifaximin 7/20 - 8/2  Subjective: Nonverbal.   Objective: Vitals:   10/23/20 1932 10/24/20 0331 10/24/20 0806 10/24/20 1143  BP: 114/85 (!) 95/54 110/62 121/73  Pulse: 86 73 87 86  Resp: 14 14 15 15   Temp: 98.2 F (36.8 C) (!) 97.1 F (36.2 C) 98.5 F (36.9 C) 97.7 F (36.5 C)  TempSrc:      SpO2: 99%  96% 96% 100%  Weight:      Height:        Intake/Output Summary (Last 24 hours) at 10/24/2020 1354 Last data filed at 10/23/2020 1501 Gross per 24 hour  Intake 611.89 ml  Output --  Net 611.89 ml   Filed Weights   10/06/20 0500 10/09/20 0500 10/09/20 0938  Weight: 27.8 kg 40.8 kg 39.3 kg    Gen: 44 y.o. female in no distress HEENT: NGT in place. Pulm: Non-labored breathing.  Clear to auscultation bilaterally.  CV: Regular rate and rhythm. No murmur, rub, or gallop. No JVD, no pitting pedal edema. GI: Abdomen distended but without grimace elicited with palpation. +BS.   Ext: Warm, some spastic deformity. Decreased muscle bulk. Skin: No draining ulcers Neuro: Alert, tracks across room, MAE, doesn't follow commands. Psych: Calm, at times difficult to redirect.  Data Reviewed: I have personally reviewed following labs and imaging studies  CBC: Recent Labs  Lab 10/22/20 0451 10/23/20 0918 10/24/20 0842  WBC 8.3 8.6 5.9  NEUTROABS 6.1 7.2 4.1  HGB 11.7* 12.3 12.1  HCT 37.0 39.4 37.4  MCV 95.4 95.6 91.7  PLT 128* 121* 88*   Basic Metabolic Panel: Recent Labs  Lab 10/22/20 0451 10/23/20 0918  NA 137 139  K 4.0 3.5  CL 105 103  CO2 26 29  GLUCOSE 90 100*  BUN 22* 17  CREATININE 0.46 0.47  CALCIUM 8.5* 8.9   GFR: Estimated Creatinine Clearance: 54.5 mL/min (by C-G formula based on SCr of 0.47 mg/dL). Liver Function Tests: Recent Labs  Lab 10/23/20 0918  AST 30  ALT 43  ALKPHOS 37*  BILITOT 0.5  PROT 6.5  ALBUMIN 3.3*   No results for input(s): LIPASE, AMYLASE in the last 168 hours. No results for input(s): AMMONIA in the last 168 hours. Coagulation Profile: No results for input(s): INR, PROTIME in the last 168 hours. Cardiac Enzymes: No results for input(s): CKTOTAL, CKMB, CKMBINDEX, TROPONINI in the last 168 hours. BNP (last 3 results) No results for input(s): PROBNP in the last 8760 hours. HbA1C: No results for input(s): HGBA1C in the last 72 hours. CBG: No results for input(s): GLUCAP in the last 168 hours. Lipid Profile: No results for input(s): CHOL, HDL, LDLCALC, TRIG, CHOLHDL, LDLDIRECT in the last 72 hours. Thyroid Function Tests: No results for input(s): TSH, T4TOTAL, FREET4, T3FREE, THYROIDAB in the last 72 hours. Anemia Panel: Recent Labs    10/23/20 0916  FERRITIN 235   Urine analysis:    Component Value  Date/Time   COLORURINE YELLOW (A) 10/23/2020 0929   APPEARANCEUR HAZY (A) 10/23/2020 0929   LABSPEC 1.021 10/23/2020 0929   PHURINE 7.0 10/23/2020 0929   GLUCOSEU NEGATIVE 10/23/2020 0929   HGBUR NEGATIVE 10/23/2020 0929   BILIRUBINUR NEGATIVE 10/23/2020 0929   KETONESUR NEGATIVE 10/23/2020 0929   PROTEINUR NEGATIVE 10/23/2020 0929   UROBILINOGEN 1.0 04/25/2013 1427   NITRITE NEGATIVE 10/23/2020 0929   LEUKOCYTESUR TRACE (A) 10/23/2020 0929   Recent Results (from the past 240 hour(s))  Culture, blood (Routine X 2) w Reflex to ID Panel     Status: None (Preliminary result)   Collection Time: 10/23/20  9:18 AM   Specimen: BLOOD  Result Value Ref Range Status   Specimen Description BLOOD RIGHT [facility]  Final   Special Requests   Final    BOTTLES DRAWN AEROBIC AND ANAEROBIC Blood Culture adequate volume   Culture   Final    NO GROWTH < 24 HOURS Performed at [facility]  Lab, [address], [place], [place] [address]    Report Status PENDING  Incomplete  Culture, blood (Routine X 2) w Reflex to ID Panel     Status: None (Preliminary result)   Collection Time: 10/23/20  9:18 AM   Specimen: BLOOD  Result Value Ref Range Status   Specimen Description BLOOD RIGHT FA  Final   Special Requests   Final    BOTTLES DRAWN AEROBIC AND ANAEROBIC Blood Culture adequate volume   Culture   Final    NO GROWTH < 24 HOURS Performed at [facility], [address], [place], [place] [address]    Report Status PENDING  Incomplete      Radiology Studies: DG Abd 1 View  Result Date: 10/23/2020 CLINICAL DATA:  NG tube placement. EXAM: ABDOMEN - 1 VIEW COMPARISON:  Chest x-ray 10/23/2020. Abdomen 10/22/2020. CT 09/24/2020. FINDINGS: NG tube noted with tip over the stomach. Moderate gastric distention. Mild small-bowel and colonic distention again noted. No free air. Stable right rib deformities. Prior thoracolumbar lumbosacral spine fusion. IMPRESSION: NG tube noted with tip over the  stomach. Moderate gastric distention. Mild small-bowel and colonic distention again noted. Electronically Signed   By: Maisie Fus  Register   On: 10/23/2020 13:27   CT ABDOMEN PELVIS W CONTRAST  Result Date: 10/23/2020 CLINICAL DATA:  Bowel obstruction suspected EXAM: CT ABDOMEN AND PELVIS WITH CONTRAST TECHNIQUE: Multidetector CT imaging of the abdomen and pelvis was performed using the standard protocol following bolus administration of intravenous contrast. CONTRAST:  43mL OMNIPAQUE IOHEXOL 300 MG/ML  SOLN COMPARISON:  Recent radiographs.  CT 09/24/2020 FINDINGS: Lower chest: Ground-glass and tree in bud opacities in the right greater than left lower lobe. Enteric tube within the esophagus with mild intraluminal fluid. Hepatobiliary: No focal liver abnormality is seen. No gallstones, gallbladder wall thickening, or biliary dilatation. Pancreas: No ductal dilatation or inflammation. Spleen: Normal in size without focal abnormality. Adrenals/Urinary Tract: No adrenal nodule. No hydronephrosis or perinephric edema. Occasional renal cysts are similar to prior. Symmetric renal excretion on delayed phase imaging. Unremarkable urinary bladder Stomach/Bowel: Enteric tube within the stomach. Fluid within mildly dilated distal esophagus. Stomach is distended with air and enteric contents. Administered enteric contrast reaches distal small bowel. There is slight progressive small bowel dilatation with increase fluid in distal small bowel loops, however no discrete transition point or evidence of obstruction. Moderate volume of stool in the ascending colon. Gaseous distension of transverse colon. Transverse colon is redundant. Moderate stool in the sigmoid colon. There is a large volume of stool in the rectum with rectal distention of 8.3 cm. Associated rectal wall thickening, hyperemia and perirectal fat stranding. There is no pneumatosis or perforation. Vascular/Lymphatic: No acute vascular findings. No portal venous or  mesenteric gas. No bulky abdominopelvic adenopathy. Limited assessment for adenopathy in given paucity of intra-abdominal fat. Reproductive: The uterus is displaced anteriorly and into the left pelvis related to rectal distension. Similar appearance to prior exam. No adnexal mass. Other: No free air, free fluid or ascites. No abdominopelvic collection. Generalized paucity of subcutaneous and intra-abdominal fat. Musculoskeletal: Scoliosis with extensive thoracolumbar fusion hardware. No acute osseous abnormalities are seen. IMPRESSION: 1. Large volume of stool in the rectum with rectal wall thickening, hyperemia and perirectal fat stranding, consistent with fecal impaction. 2. Slight progressive small bowel dilatation with increase fluid in distal small bowel loops, however no discrete transition point or evidence of obstruction. Findings likely represent slow transit no ileus. Administered enteric contrast reaches distal small bowel.  3. Ground-glass and tree in bud opacities in the right greater than left lower lobe, suspicious for pneumonia, including aspiration. Electronically Signed   By: Narda Rutherford M.D.   On: 10/23/2020 17:06   DG Chest Port 1 View  Result Date: 10/23/2020 CLINICAL DATA:  Fevers EXAM: PORTABLE CHEST 1 VIEW COMPARISON:  09/24/2020 FINDINGS: Cardiac shadow is stable. Poor inspiratory effort crowds the vascular markings. No focal infiltrate or effusion is seen. Scoliosis rods are again seen and stable. Old right healed clavicular fracture is noted. IMPRESSION: No acute abnormality is noted. Electronically Signed   By: Alcide Clever M.D.   On: 10/23/2020 08:25   DG Abd Portable 2V  Result Date: 10/22/2020 CLINICAL DATA:  Pain. Rule out small bowel obstruction. Stool burden. EXAM: PORTABLE ABDOMEN - 2 VIEW COMPARISON:  Most recent radiograph 10/02/2020 FINDINGS: Similar diffuse gaseous distension of small and large bowel throughout the abdomen. Small volume of formed stool in the  sigmoid colon, no other significant formed stool is seen. There may be mild gaseous gastric distension. No obvious free air on these supine portable views. Extensive thoracolumbar spinal fusion hardware. IMPRESSION: Similar diffuse gaseous distension of small and large bowel throughout the abdomen, favor ileus. Small volume of formed stool in the sigmoid colon. Electronically Signed   By: Narda Rutherford M.D.   On: 10/22/2020 15:56    Scheduled Meds:  baclofen  10 mg Oral TID   docusate  100 mg Oral BID   enoxaparin (LOVENOX) injection  30 mg Subcutaneous Q24H   feeding supplement  1 Bottle Oral TID BM   FLUoxetine  10 mg Oral Daily   LORazepam  1 mg Oral BID   OXcarbazepine  600 mg Oral BID   pantoprazole sodium  40 mg Oral Daily   polyethylene glycol  17 g Oral BID   simethicone  40 mg Oral QID   tiaGABine  4 mg Oral BID   Vitamin D (Ergocalciferol)  50,000 Units Oral Q7 days   Continuous Infusions:  sodium chloride 125 mL/hr at 10/24/20 0913   cefTRIAXone (ROCEPHIN)  IV 1 g (10/24/20 1002)   promethazine (PHENERGAN) injection (IM or IVPB) Stopped (10/22/20 1608)     LOS: 49 days   Time spent: 35 minutes.  Tyrone Nine, Duncan [facility] [email or web address] 10/24/2020, 1:54 PM

## 2020-10-24 NOTE — Progress Notes (Signed)
[facility] SURGICAL PROGRESS NOTE (cpt [phone])  Hospital Day(s): 49.   Interval History: Patient seen and examined, no acute events or new complaints overnight. Patient laying in bed; nonverbal. No new labs this morning. CT Abdomen/Pelvis concerning for fecal impaction and associated ileus vs slow transit. Attempted manual disimpaction without success. GI on board and recommending bowel regimen with rifaximin 550 mg 3 times daily for 2 weeks. She is having bowel function.   Review of Systems:  Unable to preform secondary to cognitive deficits, nonverbal    Vital signs in last 24 hours: [min-max] current  Temp:  [97.1 F (36.2 C)-98.5 F (36.9 C)] 97.1 F (36.2 C) (07/20 0331) Pulse Rate:  [73-99] 73 (07/20 0331) Resp:  [14-17] 14 (07/20 0331) BP: (88-114)/(46-85) 95/54 (07/20 0331) SpO2:  [93 %-99 %] 96 % (07/20 0331)     Height: 4\' 11"  (149.9 cm) Weight: 39.3 kg (bed weight) BMI (Calculated): 13.48   Intake/Output last 2 shifts:  07/19 0701 - 07/20 0700 In: 611.9 [I.V.:461.9; IV Piggyback:150] Out: -    Physical Exam:  Constitutional: alert, no distress, nonverbal HENT: normocephalic without obvious abnormality; NGT in place; clamped  Respiratory: breathing non-labored at rest Cardiovascular: regular rate and sinus rhythm Gastrointestinal: Soft, does not appear tender but again exam is limited given her baseline, she is tympanic but distension seems improved, no evidence of peritonitis Musculoskeletal: chronic contractures, no edema or wounds   Labs:  CBC Latest Ref Rng & Units 10/23/2020 10/22/2020 10/10/2020  WBC 4.0 - 10.5 K/uL 8.6 8.3 5.6  Hemoglobin 12.0 - 15.0 g/dL 12/11/2020 11.7(L) 10.6(L)  Hematocrit 36.0 - 46.0 % 39.4 37.0 33.5(L)  Platelets 150 - 400 K/uL 121(L) 128(L) 162   CMP Latest Ref Rng & Units 10/23/2020 10/22/2020 10/10/2020  Glucose 70 - 99 mg/dL 12/11/2020) 90 73  BUN 6 - 20 mg/dL 17 010(X) 18  Creatinine 0.44 - 1.00 mg/dL 32(T 5.57 3.22)  Sodium  135 - 145 mmol/L 139 137 138  Potassium 3.5 - 5.1 mmol/L 3.5 4.0 3.6  Chloride 98 - 111 mmol/L 103 105 107  CO2 22 - 32 mmol/L 29 26 26   Calcium 8.9 - 10.3 mg/dL 8.9 0.25(K) 8.3(L)  Total Protein 6.5 - 8.1 g/dL 6.5 - -  Total Bilirubin 0.3 - 1.2 mg/dL 0.5 - -  Alkaline Phos 38 - 126 U/L 37(L) - -  AST 15 - 41 U/L 30 - -  ALT 0 - 44 U/L 43 - -     Imaging studies:  CT Abdomen/Pelvis (10/23/2020) personally reviewed with fecal impaction in rectum with associated small bowel dilation, no transition point, no pneumoperitoneum, and radiologist report reviewed below: IMPRESSION: 1. Large volume of stool in the rectum with rectal wall thickening, hyperemia and perirectal fat stranding, consistent with fecal impaction. 2. Slight progressive small bowel dilatation with increase fluid in distal small bowel loops, however no discrete transition point or evidence of obstruction. Findings likely represent slow transit no ileus. Administered enteric contrast reaches distal small bowel. 3. Ground-glass and tree in bud opacities in the right greater than left lower lobe, suspicious for pneumonia, including aspiration.   Assessment/Plan: (ICD-10's: K32.7) 45 y.o. female with abdominal distension and severe constipation with associated ileus vs underlying functional disorder/dysmotility, complicated by pertinent comorbidities including Rett syndrome, severe cognitive disability, seizure disorder, and nonverbal at baseline   - Okay to continue feedings as tolerated - No role for surgical intervention - Agree with aggressive bowel regimen - Appreciate GI assistance & recommendation   -  Monitor abdominal examination; on-going bowel function   - Further management per primary service; we will sign off but be available as needed   All of the above findings and recommendations were discussed with the medical team.   -- Lynden Oxford, PA-C [facility] 10/24/2020, 7:04  AM [phone] M-F: 7am - 4pm

## 2020-10-25 ENCOUNTER — Inpatient Hospital Stay: Payer: Medicare Other

## 2020-10-25 DIAGNOSIS — K5641 Fecal impaction: Secondary | ICD-10-CM | POA: Diagnosis not present

## 2020-10-25 DIAGNOSIS — K5900 Constipation, unspecified: Secondary | ICD-10-CM | POA: Diagnosis not present

## 2020-10-25 DIAGNOSIS — R627 Adult failure to thrive: Secondary | ICD-10-CM | POA: Diagnosis not present

## 2020-10-25 DIAGNOSIS — F842 Rett's syndrome: Secondary | ICD-10-CM | POA: Diagnosis not present

## 2020-10-25 DIAGNOSIS — K567 Ileus, unspecified: Secondary | ICD-10-CM | POA: Diagnosis not present

## 2020-10-25 LAB — BASIC METABOLIC PANEL
Anion gap: 9 (ref 5–15)
BUN: 12 mg/dL (ref 6–20)
CO2: 24 mmol/L (ref 22–32)
Calcium: 8.2 mg/dL — ABNORMAL LOW (ref 8.9–10.3)
Chloride: 107 mmol/L (ref 98–111)
Creatinine, Ser: 0.36 mg/dL — ABNORMAL LOW (ref 0.44–1.00)
GFR, Estimated: >60 mL/min (ref >60)
Glucose, Bld: 76 mg/dL (ref 70–99)
Potassium: 4.2 mmol/L (ref 3.5–5.1)
Sodium: 140 mmol/L (ref 135–145)

## 2020-10-25 LAB — CBC
HCT: 39.7 % (ref 36.0–46.0)
Hemoglobin: 12.2 g/dL (ref 12.0–15.0)
MCH: 29.8 pg (ref 26.0–34.0)
MCHC: 30.7 g/dL (ref 30.0–36.0)
MCV: 97.1 fL (ref 80.0–100.0)
Platelets: 102 10*3/uL — ABNORMAL LOW (ref 150–400)
RBC: 4.09 MIL/uL (ref 3.87–5.11)
RDW: 19.3 % — ABNORMAL HIGH (ref 11.5–15.5)
WBC: 4.9 10*3/uL (ref 4.0–10.5)
nRBC: 0 % (ref 0.0–0.2)

## 2020-10-25 LAB — PHOSPHORUS: Phosphorus: 2.6 mg/dL (ref 2.5–4.6)

## 2020-10-25 LAB — MAGNESIUM: Magnesium: 2.5 mg/dL — ABNORMAL HIGH (ref 1.7–2.4)

## 2020-10-25 MED ORDER — K PHOS MONO-SOD PHOS DI & MONO 155-852-130 MG PO TABS
500.0000 mg | ORAL_TABLET | Freq: Every day | ORAL | Status: DC
  Administered 2020-10-25 – 2020-10-28 (×4): 500 mg via ORAL
  Filled 2020-10-25 (×7): qty 2

## 2020-10-25 MED ORDER — MINERAL OIL RE ENEM
1.0000 | ENEMA | Freq: Once | RECTAL | Status: AC
  Administered 2020-10-25: 1 via RECTAL

## 2020-10-25 NOTE — Progress Notes (Signed)
Nutrition Follow-up  DOCUMENTATION CODES:  Severe malnutrition in context of chronic illness, Underweight  INTERVENTION:  Continue current diet as ordered per SLP Nursing to assist with meals Ensure Enlive po TID, each supplement provides 350 kcal and 20 grams of protein Request new weight  NUTRITION DIAGNOSIS:  Severe Malnutrition related to chronic illness (Rett syndrome) as evidenced by severe muscle depletion, severe fat depletion, percent weight loss (11.6% x 6 months).  - ongoing  GOAL:  Patient will meet greater than or equal to 90% of their needs, Weight gain  - in progress  MONITOR:  PO intake, Supplement acceptance, Labs, Weight trends  REASON FOR ASSESSMENT:  Other (Comment), Malnutrition Screening Tool (Low BMI, 13.48)    ASSESSMENT:  Pt brought to ED after concerns were raised over her care at home. Caregiver has been on vacation for several days and family at home have not been able to adequately take care of the patient. 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  Pt developed fever earlier in the week, imaging showed significant stool burden (which also occurred earlier in admission), possible ileus, and possible pneumonia. NGT was placed, currently clamped but remains in place in the event that bowel prep is needed for scoping procedure.   Discussed with RN, pt is back on her DYS 1 diet and is taking nutrition supplements by bottle per usual. Intake appears to have improved since last assessment. No new weight taken since early July. Requested new weight again.    Average Meal Intake: 5/30-5/31: 50% intake x 1 recorded meal 6/1-6/7: 38% intake x 2 recorded meals 6/8-6/14: 19% intake x 8 recorded meals 6/15-6/19: 36% intake x 7 recorded meals 6/23-6/27: 50% intake x 6 recorded meals 6/28-7/4: 44% intake x 9 recorded meals 7/5-7/11: 62%  intake x 14 recorded meals 7/12-7/14: 38% intake x 6 recorded meals 7/15-7/21: 75% intake x 7 recorded meals  Nutritionally Relevant Medications: Scheduled Meds:  baclofen  10 mg Oral TID   docusate  100 mg Oral BID   feeding supplement  1 Bottle Oral TID BM   pantoprazole sodium  40 mg Oral Daily   polyethylene glycol  17 g Oral BID   simethicone  40 mg Oral QID   Vitamin D (Ergocalciferol)  50,000 Units Oral Q7 days   Continuous Infusions:  cefTRIAXone (ROCEPHIN)  IV 1 g (10/25/20 0859)   PRN Meds: bisacodyl ondansetron, promethazine  Labs Reviewed  Diet Order:   Diet Order             DIET - DYS 1 Room service appropriate? Yes; Fluid consistency: Thin  Diet effective now           Diet - low sodium heart healthy                  EDUCATION NEEDS:  Not appropriate for education at this time  Skin:  Skin Assessment: Reviewed RN Assessment  Last BM:  7/21 - type 7  Height:  Ht Readings from Last 1 Encounters:  09/03/20 4\' 11"  (1.499 m)   Weight:  Wt Readings from Last 1 Encounters:  10/09/20 39.3 kg   Ideal Body Weight:  44.3 kg  BMI:  Body mass index is 17.5 kg/m.  Estimated Nutritional Needs:  Kcal:  1400-1600 kcal/d Protein:  70-85 g/d Fluid:  >1500 mL/d  12/10/20, RD, LDN Clinical Dietitian Pager on Amion

## 2020-10-25 NOTE — Progress Notes (Signed)
Alicia Repress, MD [address]  [address]  [place], [place] [address]  Main: [phone]  Fax: [phone] Pager: [phone]   Subjective:  No acute events overnight. She has been having several BMs, tolerating PO well  Objective: Vital signs in last 24 hours: Vitals:   10/24/20 1942 10/24/20 2357 10/25/20 0314 10/25/20 0707  BP: 112/65 110/62 118/71 (!) 111/59  Pulse: (!) 106 84 82 69  Resp: 16 16 14 16   Temp: 98.6 F (37 C) (!) 97.4 F (36.3 C) 97.9 F (36.6 C) 98 F (36.7 C)  TempSrc: Oral Oral Oral   SpO2: 94% 98% 97% 97%  Weight:      Height:       Weight change:   Intake/Output Summary (Last 24 hours) at 10/25/2020 1116 Last data filed at 10/24/2020 1910 Gross per 24 hour  Intake 0 ml  Output --  Net 0 ml     Exam: Heart:: Regular rate and rhythm, S1S2 present, or without murmur or extra heart sounds Lungs: normal and clear to auscultation Abdomen: soft, nontender, moderately distended, tympanic, normal bowel sounds   Lab Results: CBC Latest Ref Rng & Units 10/25/2020 10/24/2020 10/23/2020  WBC 4.0 - 10.5 K/uL 4.9 5.9 8.6  Hemoglobin 12.0 - 15.0 g/dL 10/25/2020 39.0 30.0  Hematocrit 36.0 - 46.0 % 39.7 37.4 39.4  Platelets 150 - 400 K/uL 102(L) 88(L) 121(L)   CMP Latest Ref Rng & Units 10/25/2020 10/23/2020 10/22/2020  Glucose 70 - 99 mg/dL 76 10/24/2020) 90  BUN 6 - 20 mg/dL 12 17 300(T)  Creatinine 0.44 - 1.00 mg/dL 62(U) 6.33(H 5.45  Sodium 135 - 145 mmol/L 140 139 137  Potassium 3.5 - 5.1 mmol/L 4.2 3.5 4.0  Chloride 98 - 111 mmol/L 107 103 105  CO2 22 - 32 mmol/L 24 29 26   Calcium 8.9 - 10.3 mg/dL 8.2(L) 8.9 8.5(L)  Total Protein 6.5 - 8.1 g/dL - 6.5 -  Total Bilirubin 0.3 - 1.2 mg/dL - 0.5 -  Alkaline Phos 38 - 126 U/L - 37(L) -  AST 15 - 41 U/L - 30 -  ALT 0 - 44 U/L - 43 -     Micro Results: Recent Results (from the past 240 hour(s))  Culture, blood (Routine X 2) w Reflex to ID Panel     Status: None (Preliminary result)   Collection  Time: 10/23/20  9:18 AM   Specimen: BLOOD  Result Value Ref Range Status   Specimen Description BLOOD RIGHT [facility]  Final   Special Requests   Final    BOTTLES DRAWN AEROBIC AND ANAEROBIC Blood Culture adequate volume   Culture   Final    NO GROWTH 2 DAYS Performed at [facility], [address], [place], [address] [place]    Report Status PENDING  Incomplete  Culture, blood (Routine X 2) w Reflex to ID Panel     Status: None (Preliminary result)   Collection Time: 10/23/20  9:18 AM   Specimen: BLOOD  Result Value Ref Range Status   Specimen Description BLOOD RIGHT FA  Final   Special Requests   Final    BOTTLES DRAWN AEROBIC AND ANAEROBIC Blood Culture adequate volume   Culture   Final    NO GROWTH 2 DAYS Performed at [facility], [address], [place], [address] [place]    Report Status PENDING  Incomplete   Studies/Results: DG Abd 1 View  Result Date: 10/25/2020 CLINICAL DATA:  Follow-up ileus EXAM:  ABDOMEN - 1 VIEW COMPARISON:  10/23/2020 FINDINGS: Scattered large and small bowel gas is noted similar to that seen on the prior exam consistent with a generalized ileus. Gastric catheter is noted in place. Postsurgical changes in the thoracolumbar spine are again seen and stable. No free air is noted. No new acute abnormality is noted. IMPRESSION: Findings consistent with a generalized ileus. This is similar to that seen on the prior exam. Electronically Signed   By: Alcide Clever M.D.   On: 10/25/2020 08:35   DG Abd 1 View  Result Date: 10/23/2020 CLINICAL DATA:  NG tube placement. EXAM: ABDOMEN - 1 VIEW COMPARISON:  Chest x-ray 10/23/2020. Abdomen 10/22/2020. CT 09/24/2020. FINDINGS: NG tube noted with tip over the stomach. Moderate gastric distention. Mild small-bowel and colonic distention again noted. No free air. Stable right rib deformities. Prior thoracolumbar lumbosacral spine fusion. IMPRESSION: NG tube noted with tip over the stomach. Moderate gastric  distention. Mild small-bowel and colonic distention again noted. Electronically Signed   By: Maisie Fus  Register   On: 10/23/2020 13:27   CT ABDOMEN PELVIS W CONTRAST  Result Date: 10/23/2020 CLINICAL DATA:  Bowel obstruction suspected EXAM: CT ABDOMEN AND PELVIS WITH CONTRAST TECHNIQUE: Multidetector CT imaging of the abdomen and pelvis was performed using the standard protocol following bolus administration of intravenous contrast. CONTRAST:  1mL OMNIPAQUE IOHEXOL 300 MG/ML  SOLN COMPARISON:  Recent radiographs.  CT 09/24/2020 FINDINGS: Lower chest: Ground-glass and tree in bud opacities in the right greater than left lower lobe. Enteric tube within the esophagus with mild intraluminal fluid. Hepatobiliary: No focal liver abnormality is seen. No gallstones, gallbladder wall thickening, or biliary dilatation. Pancreas: No ductal dilatation or inflammation. Spleen: Normal in size without focal abnormality. Adrenals/Urinary Tract: No adrenal nodule. No hydronephrosis or perinephric edema. Occasional renal cysts are similar to prior. Symmetric renal excretion on delayed phase imaging. Unremarkable urinary bladder Stomach/Bowel: Enteric tube within the stomach. Fluid within mildly dilated distal esophagus. Stomach is distended with air and enteric contents. Administered enteric contrast reaches distal small bowel. There is slight progressive small bowel dilatation with increase fluid in distal small bowel loops, however no discrete transition point or evidence of obstruction. Moderate volume of stool in the ascending colon. Gaseous distension of transverse colon. Transverse colon is redundant. Moderate stool in the sigmoid colon. There is a large volume of stool in the rectum with rectal distention of 8.3 cm. Associated rectal wall thickening, hyperemia and perirectal fat stranding. There is no pneumatosis or perforation. Vascular/Lymphatic: No acute vascular findings. No portal venous or mesenteric gas. No bulky  abdominopelvic adenopathy. Limited assessment for adenopathy in given paucity of intra-abdominal fat. Reproductive: The uterus is displaced anteriorly and into the left pelvis related to rectal distension. Similar appearance to prior exam. No adnexal mass. Other: No free air, free fluid or ascites. No abdominopelvic collection. Generalized paucity of subcutaneous and intra-abdominal fat. Musculoskeletal: Scoliosis with extensive thoracolumbar fusion hardware. No acute osseous abnormalities are seen. IMPRESSION: 1. Large volume of stool in the rectum with rectal wall thickening, hyperemia and perirectal fat stranding, consistent with fecal impaction. 2. Slight progressive small bowel dilatation with increase fluid in distal small bowel loops, however no discrete transition point or evidence of obstruction. Findings likely represent slow transit no ileus. Administered enteric contrast reaches distal small bowel. 3. Ground-glass and tree in bud opacities in the right greater than left lower lobe, suspicious for pneumonia, including aspiration. Electronically Signed   By: Narda Rutherford M.D.   On:  10/23/2020 17:06   Medications: I have reviewed the patient's current medications. Prior to Admission:  Medications Prior to Admission  Medication Sig Dispense Refill Last Dose   FLUoxetine (PROZAC) 10 MG capsule Take 10 mg by mouth daily.   Past Week at Unknown time   LORazepam (ATIVAN) 1 MG tablet Take 1 tablet (1 mg total) by mouth 2 (two) times daily. 60 tablet 3 09/03/2020   omeprazole (PRILOSEC) 20 MG capsule Take 20 mg by mouth daily.   Past Week at Unknown time   Oxcarbazepine (TRILEPTAL) 300 MG tablet Take 2 tablets (600 mg total) by mouth 2 (two) times daily. 120 tablet 5 09/03/2020   tiaGABine (GABITRIL) 4 MG tablet Take 1 tablet (4 mg total) by mouth 2 (two) times daily. 60 tablet 5 09/03/2020   [DISCONTINUED] baclofen (LIORESAL) 10 MG tablet Take 10 mg by mouth 3 (three) times daily.    Past Week at  Unknown time   Scheduled:  baclofen  10 mg Oral TID   docusate  100 mg Oral BID   enoxaparin (LOVENOX) injection  30 mg Subcutaneous Q24H   feeding supplement  1 Bottle Oral TID BM   FLUoxetine  10 mg Oral Daily   LORazepam  1 mg Oral BID   OXcarbazepine  600 mg Oral BID   pantoprazole sodium  40 mg Oral Daily   polyethylene glycol  17 g Oral BID   rifaximin  550 mg Oral TID   simethicone  40 mg Oral QID   tiaGABine  4 mg Oral BID   Vitamin D (Ergocalciferol)  50,000 Units Oral Q7 days   Continuous:  cefTRIAXone (ROCEPHIN)  IV 1 g (10/25/20 0859)   promethazine (PHENERGAN) injection (IM or IVPB) Stopped (10/22/20 1608)   SWN:[id number] **OR** acetaminophen, bisacodyl, morphine injection, ondansetron **OR** ondansetron (ZOFRAN) IV, promethazine (PHENERGAN) injection (IM or IVPB) Anti-infectives (From admission, onward)    Start     Dose/Rate Route Frequency Ordered Stop   10/25/20 1200  cefTRIAXone (ROCEPHIN) 1 g in sodium chloride 0.9 % 100 mL IVPB        1 g 200 mL/hr over 30 Minutes Intravenous Every 24 hours 10/24/20 1404 10/28/20 0959   10/24/20 1600  rifaximin (XIFAXAN) tablet 550 mg        550 mg Oral 3 times daily 10/24/20 1407 11/07/20 1559   10/23/20 1130  cefTRIAXone (ROCEPHIN) 1 g in sodium chloride 0.9 % 100 mL IVPB  Status:  Discontinued        1 g 200 mL/hr over 30 Minutes Intravenous Every 24 hours 10/23/20 1030 10/24/20 1404      Scheduled Meds:  baclofen  10 mg Oral TID   docusate  100 mg Oral BID   enoxaparin (LOVENOX) injection  30 mg Subcutaneous Q24H   feeding supplement  1 Bottle Oral TID BM   FLUoxetine  10 mg Oral Daily   LORazepam  1 mg Oral BID   OXcarbazepine  600 mg Oral BID   pantoprazole sodium  40 mg Oral Daily   polyethylene glycol  17 g Oral BID   rifaximin  550 mg Oral TID   simethicone  40 mg Oral QID   tiaGABine  4 mg Oral BID   Vitamin D (Ergocalciferol)  50,000 Units Oral Q7 days   Continuous Infusions:  cefTRIAXone  (ROCEPHIN)  IV 1 g (10/25/20 0859)   promethazine (PHENERGAN) injection (IM or IVPB) Stopped (10/22/20 1608)   PRN Meds:.acetaminophen **OR** acetaminophen, bisacodyl, morphine injection, ondansetron **OR**  ondansetron (ZOFRAN) IV, promethazine (PHENERGAN) injection (IM or IVPB)   Assessment: Principal Problem:   Failure to thrive in adult Active Problems:   Rett syndrome   Seizure disorder (HCC)   Severe protein-calorie malnutrition (HCC)   Constipation   Vaginal candidiasis   Fecal impaction (HCC)   Depression    Plan: Generalized ileus with possible fecal impaction based on imaging Empty rectal vault on digital rectal exam Continue bowel regimen Diet as tolerated Check K, Mag, Phos and goal to keep K >4, Mag >2, Phos  > 3 Will defer endoscopic evaluation at this time   LOS: 50 days   Alicia Duncan 10/25/2020, 11:16 AM

## 2020-10-25 NOTE — Progress Notes (Signed)
PROGRESS NOTE  Alicia Duncan  IDP:[id number] DOB: 11/11/1975 DOA: 09/03/2020 PCP: [name], MD   Brief Narrative: Alicia Duncan is a 45 y.o. female with PMH significant for Rett syndrome, cognitive impairment, developmental delay, nonverbal, seizure disorder and debility with complete dependence for ADLs. 5/30, patient was brought to ED by EMS due to unsafe home environment.  Patient's caregiver went on vacation for several days and family at home was not able to adequately take care of the patient.  Reportedly, patient's father ha advanced dementia and brother has schizophrenia.  Patient is currently waiting for appropriate placement.  Assessment & Plan: Principal Problem:   Failure to thrive in adult Active Problems:   Rett syndrome   Seizure disorder (HCC)   Severe protein-calorie malnutrition (HCC)   Constipation   Vaginal candidiasis   Fecal impaction (HCC)   Depression  Sepsis due to aspiration pneumonia: CT with R > LLL opacities and pt with known dysphagia: On 7/19 (not POA) with fever 101, tachycardia, lactic acidosis. - Continue ceftriaxone x5 days. If leukocytosis develops, consider addition of flagyl. Pt has PCN allergy.  - Lactic acid cleared, BP stabilized during day. Encourage po.  Generalized ileus with fecal impaction: Suspected encopresis.  - Surgery and GI consulted. Will follow recommendations for mineral oil enemas, dulcolax suppository, and rifaximin 550 mg TID x 2 weeks(possible bacterial overgrowth). Deferring flex sig for now. Having BMs though no change to significant diffuse air filled loops of bowel on my personal interpretation.   Failure to thrive Rett syndrome with cognitive disability Developmental delay/nonverbal with oral phase dysphagia at baseline - Requires total care - Continue dysphagia 1 diet with aspiration precautions.   - Continue supportive care.   - Needs a safe discharge plan which TOC is working on. Currently would be able to  transfer 7/28.   Seizure disorder: 7/8 seizure-like activity lasted 45 seconds as per RN, no postictal symptoms. No recurrence currently.  - Continue ativan, trileptal, gabitril.     Depression/anxiety - Continue fluoxetine, ativan.   Candida vulvovaginitis: Treated with nystatin.    Iron deficiency: Given IV Venofer while hospitalized.  - Will continue enteric supplementation.    Vitamin D insufficiency - Supplement weekly.   Severe protein calorie malnutrition: Likely secondary to Rett syndrome. Patient with severe muscle depletion, severe fat depletion, percent weight loss 11.6% x 6 months. - Nutritional supplementation with Ensure discontinued due to gaseous distention. Continue tube feeds.  - Continue dysphagia 1 diet  - Dietitian following.    Thrombocytopenia: Chronic issue, stable.  - Ok for VTE ppx with plt >50k.  DVT prophylaxis: Lovenox 30mg  Code Status: DNR Family Communication: None at bedside Disposition Plan:  Status is: Inpatient  Remains inpatient appropriate because:Inpatient level of care appropriate due to severity of illness  Dispo: The patient is from: Home              Anticipated d/c is to: ALF 7/28.              Patient currently is not medically stable to d/c.   Difficult to place patient Yes       Consultants:  Surgery GI  Procedures:  None  Antimicrobials: Ceftriaxone 7/19 - 7/13 Rifaximin 7/20 - 8/2  Subjective: Nonverbal, having BMs that were mushy yesterday and liquid this morning.   Objective: Vitals:   10/25/20 0314 10/25/20 0707 10/25/20 1154 10/25/20 1203  BP: 118/71 (!) 111/59 124/75   Pulse: 82 69 76   Resp: 14 16  16   Temp: 97.9 F (36.6 C) 98 F (36.7 C) 98.5 F (36.9 C)   TempSrc: Oral  Oral   SpO2: 97% 97% 100%   Weight:    41.8 kg  Height:        Intake/Output Summary (Last 24 hours) at 10/25/2020 1433 Last data filed at 10/24/2020 1910 Gross per 24 hour  Intake 0 ml  Output --  Net 0 ml   Filed  Weights   10/09/20 0500 10/09/20 0938 10/25/20 1203  Weight: 40.8 kg 39.3 kg 41.8 kg   Gen: 45 y.o. female in no distress Pulm: Nonlabored breathing room air. Clear. CV: Regular rate and rhythm. No murmur, rub, or gallop. No JVD, no pitting dependent edema. GI: Abdomen tympanic, distended with +BS, no tenderness of rebound elicited.  Ext: Warm Skin: No rashes, lesions or ulcers on visualized skin. Neuro: Alert and nonverbal, not cooperative with exam. Psych: Calm, otherwise UTD.  Data Reviewed: I have personally reviewed following labs and imaging studies  CBC: Recent Labs  Lab 10/22/20 0451 10/23/20 0918 10/24/20 0842 10/25/20 0411  WBC 8.3 8.6 5.9 4.9  NEUTROABS 6.1 7.2 4.1  --   HGB 11.7* 12.3 12.1 12.2  HCT 37.0 39.4 37.4 39.7  MCV 95.4 95.6 91.7 97.1  PLT 128* 121* 88* 102*   Basic Metabolic Panel: Recent Labs  Lab 10/22/20 0451 10/23/20 0918 10/25/20 0411  NA 137 139 140  K 4.0 3.5 4.2  CL 105 103 107  CO2 26 29 24   GLUCOSE 90 100* 76  BUN 22* 17 12  CREATININE 0.46 0.47 0.36*  CALCIUM 8.5* 8.9 8.2*  MG  --   --  2.5*  PHOS  --   --  2.6   GFR: Estimated Creatinine Clearance: 58 mL/min (A) (by C-G formula based on SCr of 0.36 mg/dL (L)). Liver Function Tests: Recent Labs  Lab 10/23/20 0918  AST 30  ALT 43  ALKPHOS 37*  BILITOT 0.5  PROT 6.5  ALBUMIN 3.3*   No results for input(s): LIPASE, AMYLASE in the last 168 hours. No results for input(s): AMMONIA in the last 168 hours. Coagulation Profile: No results for input(s): INR, PROTIME in the last 168 hours. Cardiac Enzymes: No results for input(s): CKTOTAL, CKMB, CKMBINDEX, TROPONINI in the last 168 hours. BNP (last 3 results) No results for input(s): PROBNP in the last 8760 hours. HbA1C: No results for input(s): HGBA1C in the last 72 hours. CBG: No results for input(s): GLUCAP in the last 168 hours. Lipid Profile: No results for input(s): CHOL, HDL, LDLCALC, TRIG, CHOLHDL, LDLDIRECT in the  last 72 hours. Thyroid Function Tests: No results for input(s): TSH, T4TOTAL, FREET4, T3FREE, THYROIDAB in the last 72 hours. Anemia Panel: Recent Labs    10/23/20 0916  FERRITIN 235   Urine analysis:    Component Value Date/Time   COLORURINE YELLOW (A) 10/23/2020 0929   APPEARANCEUR HAZY (A) 10/23/2020 0929   LABSPEC 1.021 10/23/2020 0929   PHURINE 7.0 10/23/2020 0929   GLUCOSEU NEGATIVE 10/23/2020 0929   HGBUR NEGATIVE 10/23/2020 0929   BILIRUBINUR NEGATIVE 10/23/2020 0929   KETONESUR NEGATIVE 10/23/2020 0929   PROTEINUR NEGATIVE 10/23/2020 0929   UROBILINOGEN 1.0 04/25/2013 1427   NITRITE NEGATIVE 10/23/2020 0929   LEUKOCYTESUR TRACE (A) 10/23/2020 0929   Recent Results (from the past 240 hour(s))  Culture, blood (Routine X 2) w Reflex to ID Panel     Status: None (Preliminary result)   Collection Time: 10/23/20  9:18 AM  Specimen: BLOOD  Result Value Ref Range Status   Specimen Description BLOOD RIGHT [facility]  Final   Special Requests   Final    BOTTLES DRAWN AEROBIC AND ANAEROBIC Blood Culture adequate volume   Culture   Final    NO GROWTH 2 DAYS Performed at [facility], [address], [place], [place] [address]    Report Status PENDING  Incomplete  Culture, blood (Routine X 2) w Reflex to ID Panel     Status: None (Preliminary result)   Collection Time: 10/23/20  9:18 AM   Specimen: BLOOD  Result Value Ref Range Status   Specimen Description BLOOD RIGHT FA  Final   Special Requests   Final    BOTTLES DRAWN AEROBIC AND ANAEROBIC Blood Culture adequate volume   Culture   Final    NO GROWTH 2 DAYS Performed at [facility], [address] [place], [place] [address]    Report Status PENDING  Incomplete      Radiology Studies: DG Abd 1 View  Result Date: 10/25/2020 CLINICAL DATA:  Follow-up ileus EXAM: ABDOMEN - 1 VIEW COMPARISON:  10/23/2020 FINDINGS: Scattered large and small bowel gas is noted similar to that seen on the prior exam  consistent with a generalized ileus. Gastric catheter is noted in place. Postsurgical changes in the thoracolumbar spine are again seen and stable. No free air is noted. No new acute abnormality is noted. IMPRESSION: Findings consistent with a generalized ileus. This is similar to that seen on the prior exam. Electronically Signed   By: Alcide Clever M.D.   On: 10/25/2020 08:35   CT ABDOMEN PELVIS W CONTRAST  Result Date: 10/23/2020 CLINICAL DATA:  Bowel obstruction suspected EXAM: CT ABDOMEN AND PELVIS WITH CONTRAST TECHNIQUE: Multidetector CT imaging of the abdomen and pelvis was performed using the standard protocol following bolus administration of intravenous contrast. CONTRAST:  82mL OMNIPAQUE IOHEXOL 300 MG/ML  SOLN COMPARISON:  Recent radiographs.  CT 09/24/2020 FINDINGS: Lower chest: Ground-glass and tree in bud opacities in the right greater than left lower lobe. Enteric tube within the esophagus with mild intraluminal fluid. Hepatobiliary: No focal liver abnormality is seen. No gallstones, gallbladder wall thickening, or biliary dilatation. Pancreas: No ductal dilatation or inflammation. Spleen: Normal in size without focal abnormality. Adrenals/Urinary Tract: No adrenal nodule. No hydronephrosis or perinephric edema. Occasional renal cysts are similar to prior. Symmetric renal excretion on delayed phase imaging. Unremarkable urinary bladder Stomach/Bowel: Enteric tube within the stomach. Fluid within mildly dilated distal esophagus. Stomach is distended with air and enteric contents. Administered enteric contrast reaches distal small bowel. There is slight progressive small bowel dilatation with increase fluid in distal small bowel loops, however no discrete transition point or evidence of obstruction. Moderate volume of stool in the ascending colon. Gaseous distension of transverse colon. Transverse colon is redundant. Moderate stool in the sigmoid colon. There is a large volume of stool in the  rectum with rectal distention of 8.3 cm. Associated rectal wall thickening, hyperemia and perirectal fat stranding. There is no pneumatosis or perforation. Vascular/Lymphatic: No acute vascular findings. No portal venous or mesenteric gas. No bulky abdominopelvic adenopathy. Limited assessment for adenopathy in given paucity of intra-abdominal fat. Reproductive: The uterus is displaced anteriorly and into the left pelvis related to rectal distension. Similar appearance to prior exam. No adnexal mass. Other: No free air, free fluid or ascites. No abdominopelvic collection. Generalized paucity of subcutaneous and intra-abdominal fat. Musculoskeletal: Scoliosis with extensive thoracolumbar fusion hardware. No acute  osseous abnormalities are seen. IMPRESSION: 1. Large volume of stool in the rectum with rectal wall thickening, hyperemia and perirectal fat stranding, consistent with fecal impaction. 2. Slight progressive small bowel dilatation with increase fluid in distal small bowel loops, however no discrete transition point or evidence of obstruction. Findings likely represent slow transit no ileus. Administered enteric contrast reaches distal small bowel. 3. Ground-glass and tree in bud opacities in the right greater than left lower lobe, suspicious for pneumonia, including aspiration. Electronically Signed   By: Narda Rutherford M.D.   On: 10/23/2020 17:06    Scheduled Meds:  baclofen  10 mg Oral TID   docusate  100 mg Oral BID   enoxaparin (LOVENOX) injection  30 mg Subcutaneous Q24H   feeding supplement  1 Bottle Oral TID BM   FLUoxetine  10 mg Oral Daily   LORazepam  1 mg Oral BID   OXcarbazepine  600 mg Oral BID   pantoprazole sodium  40 mg Oral Daily   polyethylene glycol  17 g Oral BID   rifaximin  550 mg Oral TID   simethicone  40 mg Oral QID   tiaGABine  4 mg Oral BID   Vitamin D (Ergocalciferol)  50,000 Units Oral Q7 days   Continuous Infusions:  cefTRIAXone (ROCEPHIN)  IV 1 g (10/25/20  0859)   promethazine (PHENERGAN) injection (IM or IVPB) Stopped (10/22/20 1608)     LOS: 50 days   Time spent: 35 minutes.  Tyrone Nine, MD [facility] [email or web address] 10/25/2020, 2:33 PM

## 2020-10-26 ENCOUNTER — Inpatient Hospital Stay: Payer: Medicare Other

## 2020-10-26 DIAGNOSIS — F842 Rett's syndrome: Secondary | ICD-10-CM | POA: Diagnosis not present

## 2020-10-26 DIAGNOSIS — R627 Adult failure to thrive: Secondary | ICD-10-CM | POA: Diagnosis not present

## 2020-10-26 DIAGNOSIS — K5641 Fecal impaction: Secondary | ICD-10-CM | POA: Diagnosis not present

## 2020-10-26 DIAGNOSIS — K5900 Constipation, unspecified: Secondary | ICD-10-CM | POA: Diagnosis not present

## 2020-10-26 LAB — BASIC METABOLIC PANEL
Anion gap: 7 (ref 5–15)
BUN: 13 mg/dL (ref 6–20)
CO2: 24 mmol/L (ref 22–32)
Calcium: 8.3 mg/dL — ABNORMAL LOW (ref 8.9–10.3)
Chloride: 106 mmol/L (ref 98–111)
Creatinine, Ser: 0.35 mg/dL — ABNORMAL LOW (ref 0.44–1.00)
GFR, Estimated: >60 mL/min (ref >60)
Glucose, Bld: 88 mg/dL (ref 70–99)
Potassium: 3.5 mmol/L (ref 3.5–5.1)
Sodium: 137 mmol/L (ref 135–145)

## 2020-10-26 LAB — PHOSPHORUS: Phosphorus: 3.5 mg/dL (ref 2.5–4.6)

## 2020-10-26 LAB — MAGNESIUM: Magnesium: 1.8 mg/dL (ref 1.7–2.4)

## 2020-10-26 MED ORDER — MAGNESIUM SULFATE 2 GM/50ML IV SOLN
2.0000 g | Freq: Once | INTRAVENOUS | Status: AC
  Administered 2020-10-26: 2 g via INTRAVENOUS
  Filled 2020-10-26: qty 50

## 2020-10-26 MED ORDER — PEG 3350-KCL-NA BICARB-NACL 420 G PO SOLR
4000.0000 mL | Freq: Once | ORAL | Status: AC
  Administered 2020-10-26: 4000 mL via ORAL
  Filled 2020-10-26: qty 4000

## 2020-10-26 MED ORDER — SODIUM CHLORIDE 0.9 % IV SOLN: INTRAVENOUS | Status: DC

## 2020-10-26 NOTE — Progress Notes (Addendum)
PROGRESS NOTE  Alicia Duncan  WUX:[id number] DOB: 04-Feb-1976 DOA: 09/03/2020 PCP: [name], MD   Brief Narrative: Alicia Duncan is a 45 y.o. female with PMH significant for Rett syndrome, cognitive impairment, developmental delay, nonverbal, seizure disorder and debility with complete dependence for ADLs. 5/30, patient was brought to ED by EMS due to unsafe home environment.  Patient's caregiver went on vacation for several days and family at home was not able to adequately take care of the patient.  Reportedly, patient's father ha advanced dementia and brother has schizophrenia.  Patient is currently waiting for appropriate placement.  the patient developed ileus with possible fecal impaction for which NG tube was placed and bowel regimen has been given. Plan is for colonoscopy 7/23.  Assessment & Plan: Principal Problem:   Failure to thrive in adult Active Problems:   Rett syndrome   Seizure disorder (HCC)   Severe protein-calorie malnutrition (HCC)   Constipation   Vaginal candidiasis   Fecal impaction (HCC)   Depression  Sepsis due to aspiration pneumonia: CT with R > LLL opacities and pt with known dysphagia: On 7/19 (not POA) with fever 101, tachycardia, lactic acidosis. - Continue ceftriaxone x5 days (7/19 - 7/23).  - Lactic acid cleared, BP stabilized   Generalized ileus with fecal impaction: Suspected encopresis.  - Surgery and GI consulted. We've seen persistent ileus radiographically, persistent distention without peritonitis on exam. BMs are very watery, concern for encopresis. GI recommends prep today for colonoscopy 7/23. Continuing rifaximin 550 mg TID x 2 weeks for possible bacterial overgrowth.   Failure to thrive Rett syndrome with cognitive disability Developmental delay/nonverbal with oral phase dysphagia at baseline - Requires total care - Continue dysphagia 1 diet with aspiration precautions.   - Continue supportive care.   - Needs a safe discharge  plan which TOC is working on. Currently would be able to transfer 7/28.   Seizure disorder: 7/8 seizure-like activity lasted 45 seconds as per RN, no postictal symptoms. No recurrence currently.  - Continue ativan, trileptal, gabitril.     Depression/anxiety - Continue fluoxetine, ativan.   Candida vulvovaginitis: Treated with nystatin.    Iron deficiency: Given IV Venofer while hospitalized.  - Will continue enteric supplementation.    Vitamin D insufficiency - Supplement weekly.   Severe protein calorie malnutrition: Likely secondary to Rett syndrome. Patient with severe muscle depletion, severe fat depletion, percent weight loss 11.6% x 6 months. - Nutritional supplementation with Ensure discontinued due to gaseous distention. Continue tube feeds.  - Continue dysphagia 1 diet  - Dietitian following.    Thrombocytopenia: Chronic issue, stable.  - Ok for VTE ppx with plt >50k.  DVT prophylaxis: Lovenox 30mg  Code Status: DNR Family Communication: None at bedside Disposition Plan:  Status is: Inpatient  Remains inpatient appropriate because:Inpatient level of care appropriate due to severity of illness  Dispo: The patient is from: Home              Anticipated d/c is to: ALF 7/28.              Patient currently is not medically stable to d/c.   Difficult to place patient Yes  Consultants:  Surgery GI  Procedures:  Colonoscopy planned 7/23.   Antimicrobials: Ceftriaxone 7/19 - 7/13 Rifaximin 7/20 - 8/2  Subjective: Nonverbal, still with liquid stools. Having very little per oral intake over last 24-48 hours.  Objective: Vitals:   10/25/20 1946 10/26/20 0302 10/26/20 0723 10/26/20 1130  BP: (!) 109/56 122/72  133/60 (!) 142/86  Pulse: 98 61 75 82  Resp: 18 18 20 20   Temp: 98 F (36.7 C) 97.7 F (36.5 C) 98.2 F (36.8 C) (!) 97.4 F (36.3 C)  TempSrc:   Oral   SpO2: 100% 98% 99% 98%  Weight:      Height:        Intake/Output Summary (Last 24 hours) at  10/26/2020 1305 Last data filed at 10/25/2020 1516 Gross per 24 hour  Intake 100 ml  Output --  Net 100 ml   Filed Weights   10/09/20 0500 10/09/20 0938 10/25/20 1203  Weight: 40.8 kg 39.3 kg 41.8 kg   Gen: 45 y.o. female in no distress Pulm: Nonlabored breathing room air. Clear. CV: Regular rate and rhythm. No murmur, rub, or gallop. No JVD, no dependent edema. GI: Abdomen distended without grimace to suggest tenderness, active bowel sounds.  Ext: Warm, no deformities Skin: No rashes, lesions or ulcers on visualized skin. Neuro: Alert, nonverbal, not cooperative with exam. Psych: Calm  Data Reviewed: I have personally reviewed following labs and imaging studies  CBC: Recent Labs  Lab 10/22/20 0451 10/23/20 0918 10/24/20 0842 10/25/20 0411  WBC 8.3 8.6 5.9 4.9  NEUTROABS 6.1 7.2 4.1  --   HGB 11.7* 12.3 12.1 12.2  HCT 37.0 39.4 37.4 39.7  MCV 95.4 95.6 91.7 97.1  PLT 128* 121* 88* 102*   Basic Metabolic Panel: Recent Labs  Lab 10/22/20 0451 10/23/20 0918 10/25/20 0411 10/26/20 0550  NA 137 139 140 137  K 4.0 3.5 4.2 3.5  CL 105 103 107 106  CO2 26 29 24 24   GLUCOSE 90 100* 76 88  BUN 22* 17 12 13   CREATININE 0.46 0.47 0.36* 0.35*  CALCIUM 8.5* 8.9 8.2* 8.3*  MG  --   --  2.5* 1.8  PHOS  --   --  2.6 3.5   GFR: Estimated Creatinine Clearance: 58 mL/min (A) (by C-G formula based on SCr of 0.35 mg/dL (L)). Liver Function Tests: Recent Labs  Lab 10/23/20 0918  AST 30  ALT 43  ALKPHOS 37*  BILITOT 0.5  PROT 6.5  ALBUMIN 3.3*   No results for input(s): LIPASE, AMYLASE in the last 168 hours. No results for input(s): AMMONIA in the last 168 hours. Coagulation Profile: No results for input(s): INR, PROTIME in the last 168 hours. Cardiac Enzymes: No results for input(s): CKTOTAL, CKMB, CKMBINDEX, TROPONINI in the last 168 hours. BNP (last 3 results) No results for input(s): PROBNP in the last 8760 hours. HbA1C: No results for input(s): HGBA1C in the  last 72 hours. CBG: No results for input(s): GLUCAP in the last 168 hours. Lipid Profile: No results for input(s): CHOL, HDL, LDLCALC, TRIG, CHOLHDL, LDLDIRECT in the last 72 hours. Thyroid Function Tests: No results for input(s): TSH, T4TOTAL, FREET4, T3FREE, THYROIDAB in the last 72 hours. Anemia Panel: No results for input(s): VITAMINB12, FOLATE, FERRITIN, TIBC, IRON, RETICCTPCT in the last 72 hours.  Urine analysis:    Component Value Date/Time   COLORURINE YELLOW (A) 10/23/2020 0929   APPEARANCEUR HAZY (A) 10/23/2020 0929   LABSPEC 1.021 10/23/2020 0929   PHURINE 7.0 10/23/2020 0929   GLUCOSEU NEGATIVE 10/23/2020 0929   HGBUR NEGATIVE 10/23/2020 0929   BILIRUBINUR NEGATIVE 10/23/2020 0929   KETONESUR NEGATIVE 10/23/2020 0929   PROTEINUR NEGATIVE 10/23/2020 0929   UROBILINOGEN 1.0 04/25/2013 1427   NITRITE NEGATIVE 10/23/2020 0929   LEUKOCYTESUR TRACE (A) 10/23/2020 0929   Recent Results (from the past  240 hour(s))  Culture, blood (Routine X 2) w Reflex to ID Panel     Status: None (Preliminary result)   Collection Time: 10/23/20  9:18 AM   Specimen: BLOOD  Result Value Ref Range Status   Specimen Description BLOOD RIGHT [facility]  Final   Special Requests   Final    BOTTLES DRAWN AEROBIC AND ANAEROBIC Blood Culture adequate volume   Culture   Final    NO GROWTH 3 DAYS Performed at [facility], [address] [place], [place] [address]    Report Status PENDING  Incomplete  Culture, blood (Routine X 2) w Reflex to ID Panel     Status: None (Preliminary result)   Collection Time: 10/23/20  9:18 AM   Specimen: BLOOD  Result Value Ref Range Status   Specimen Description BLOOD RIGHT FA  Final   Special Requests   Final    BOTTLES DRAWN AEROBIC AND ANAEROBIC Blood Culture adequate volume   Culture   Final    NO GROWTH 3 DAYS Performed at [facility], [address], [place], [place] [address]    Report Status PENDING  Incomplete      Radiology  Studies: DG Abd 1 View  Result Date: 10/25/2020 CLINICAL DATA:  Follow-up ileus EXAM: ABDOMEN - 1 VIEW COMPARISON:  10/23/2020 FINDINGS: Scattered large and small bowel gas is noted similar to that seen on the prior exam consistent with a generalized ileus. Gastric catheter is noted in place. Postsurgical changes in the thoracolumbar spine are again seen and stable. No free air is noted. No new acute abnormality is noted. IMPRESSION: Findings consistent with a generalized ileus. This is similar to that seen on the prior exam. Electronically Signed   By: Alcide Clever M.D.   On: 10/25/2020 08:35    Scheduled Meds:  baclofen  10 mg Oral TID   docusate  100 mg Oral BID   enoxaparin (LOVENOX) injection  30 mg Subcutaneous Q24H   feeding supplement  1 Bottle Oral TID BM   FLUoxetine  10 mg Oral Daily   LORazepam  1 mg Oral BID   OXcarbazepine  600 mg Oral BID   pantoprazole sodium  40 mg Oral Daily   phosphorus  500 mg Oral Daily   polyethylene glycol  17 g Oral BID   polyethylene glycol-electrolytes  4,000 mL Oral Once   rifaximin  550 mg Oral TID   simethicone  40 mg Oral QID   tiaGABine  4 mg Oral BID   Vitamin D (Ergocalciferol)  50,000 Units Oral Q7 days   Continuous Infusions:  sodium chloride     cefTRIAXone (ROCEPHIN)  IV 1 g (10/26/20 1104)   promethazine (PHENERGAN) injection (IM or IVPB) Stopped (10/22/20 1608)     LOS: 51 days   Time spent: 25 minutes.  Tyrone Nine, MD [facility] [email or web address] 10/26/2020, 1:05 PM

## 2020-10-27 ENCOUNTER — Encounter
Admission: EM | Disposition: A | Discharge status: Skilled Nursing Facility | Payer: Self-pay | Source: Home / Self Care | Attending: Internal Medicine

## 2020-10-27 ENCOUNTER — Inpatient Hospital Stay: Payer: Medicare Other | Admitting: Anesthesiology

## 2020-10-27 ENCOUNTER — Encounter: Payer: Self-pay | Admitting: Internal Medicine

## 2020-10-27 DIAGNOSIS — F842 Rett's syndrome: Secondary | ICD-10-CM | POA: Diagnosis not present

## 2020-10-27 DIAGNOSIS — R627 Adult failure to thrive: Secondary | ICD-10-CM | POA: Diagnosis not present

## 2020-10-27 DIAGNOSIS — K567 Ileus, unspecified: Secondary | ICD-10-CM

## 2020-10-27 DIAGNOSIS — K5900 Constipation, unspecified: Secondary | ICD-10-CM | POA: Diagnosis not present

## 2020-10-27 DIAGNOSIS — K5641 Fecal impaction: Secondary | ICD-10-CM | POA: Diagnosis not present

## 2020-10-27 HISTORY — PX: COLONOSCOPY WITH PROPOFOL: SHX5780

## 2020-10-27 LAB — BASIC METABOLIC PANEL
Anion gap: 5 (ref 5–15)
BUN: 11 mg/dL (ref 6–20)
CO2: 26 mmol/L (ref 22–32)
Calcium: 8.6 mg/dL — ABNORMAL LOW (ref 8.9–10.3)
Chloride: 108 mmol/L (ref 98–111)
Creatinine, Ser: 0.41 mg/dL — ABNORMAL LOW (ref 0.44–1.00)
GFR, Estimated: >60 mL/min (ref >60)
Glucose, Bld: 83 mg/dL (ref 70–99)
Potassium: 3.8 mmol/L (ref 3.5–5.1)
Sodium: 139 mmol/L (ref 135–145)

## 2020-10-27 LAB — PHOSPHORUS: Phosphorus: 3.2 mg/dL (ref 2.5–4.6)

## 2020-10-27 LAB — MAGNESIUM: Magnesium: 2.1 mg/dL (ref 1.7–2.4)

## 2020-10-27 SURGERY — COLONOSCOPY WITH PROPOFOL
Anesthesia: Monitor Anesthesia Care

## 2020-10-27 MED ORDER — SODIUM CHLORIDE 0.9 % IV SOLN: INTRAVENOUS | Status: DC

## 2020-10-27 MED ORDER — PROPOFOL 500 MG/50ML IV EMUL
INTRAVENOUS | Status: AC
  Filled 2020-10-27: qty 50

## 2020-10-27 MED ORDER — PROPOFOL 500 MG/50ML IV EMUL
INTRAVENOUS | Status: DC | PRN
  Administered 2020-10-27 (×4): 20 ug via INTRAVENOUS
  Administered 2020-10-27: 40 ug via INTRAVENOUS

## 2020-10-27 MED ORDER — LIDOCAINE HCL (CARDIAC) PF 100 MG/5ML IV SOSY
PREFILLED_SYRINGE | INTRAVENOUS | Status: DC | PRN
  Administered 2020-10-27: 60 mg via INTRAVENOUS

## 2020-10-27 MED ORDER — LACTATED RINGERS IV SOLN: INTRAVENOUS | Status: DC | PRN

## 2020-10-27 NOTE — Anesthesia Postprocedure Evaluation (Signed)
Anesthesia Post Note  Patient: Alicia Duncan  Procedure(s) Performed: COLONOSCOPY WITH PROPOFOL  Patient location during evaluation: PACU Anesthesia Type: MAC Level of consciousness: awake and alert Pain management: pain level controlled Vital Signs Assessment: post-procedure vital signs reviewed and stable Respiratory status: spontaneous breathing, nonlabored ventilation, respiratory function stable and patient connected to nasal cannula oxygen Cardiovascular status: stable and blood pressure returned to baseline Postop Assessment: no apparent nausea or vomiting Anesthetic complications: no   No notable events documented.   Last Vitals:  Vitals:   10/27/20 1253 10/27/20 1300  BP: 128/79 108/70  Pulse:  89  Resp:  18  Temp:    SpO2:  99%    Last Pain:  Vitals:   10/27/20 1246  TempSrc: Skin  PainSc:                  Tonny Bollman

## 2020-10-27 NOTE — Transfer of Care (Signed)
Immediate Anesthesia Transfer of Care Note  Patient: Alicia Duncan  Procedure(s) Performed: COLONOSCOPY WITH PROPOFOL  Patient Location: PACU  Anesthesia Type:MAC  Level of Consciousness: drowsy  Airway & Oxygen Therapy: Patient Spontanous Breathing  Post-op Assessment: Report given to RN  Post vital signs: stable  Last Vitals:  Vitals Value Taken Time  BP 101/62 10/27/20 1246  Temp 36.7 C 10/27/20 1246  Pulse 86 10/27/20 1246  Resp 20 10/27/20 1246  SpO2 97 % 10/27/20 1246  Vitals shown include unvalidated device data.  Last Pain:  Vitals:   10/27/20 1246  TempSrc: Skin  PainSc:          Complications: No notable events documented.

## 2020-10-27 NOTE — Progress Notes (Signed)
PROGRESS NOTE  Alicia Duncan  KNL:[id number] DOB: February 09, 1976 DOA: 09/03/2020 PCP: [name], MD   Brief Narrative: Alicia Duncan is a 45 y.o. female with PMH significant for Rett syndrome, cognitive impairment, developmental delay, nonverbal, seizure disorder and debility with complete dependence for ADLs. 5/30, Alicia Duncan was brought to ED by EMS due to unsafe home environment.  Alicia Duncan's caregiver went on vacation for several days and family at home was not able to adequately take care of the Alicia Duncan.  Reportedly, Alicia Duncan's father ha advanced dementia and brother has schizophrenia.  Alicia Duncan is currently waiting for appropriate placement.  The Alicia Duncan developed ileus with possible fecal impaction for which NG tube was placed and bowel regimen has been given. Plan is for colonoscopy 7/23.  Assessment & Plan: Principal Problem:   Failure to thrive in adult Active Problems:   Rett syndrome   Seizure disorder (HCC)   Severe protein-calorie malnutrition (HCC)   Constipation   Vaginal candidiasis   Fecal impaction (HCC)   Depression  Sepsis due to aspiration pneumonia: CT with R > LLL opacities and pt with known dysphagia: On 7/19 (not POA) with fever 101, tachycardia, lactic acidosis. - Will plan to complete ceftriaxone x5 days (7/19 - 7/23).  - Lactic acid cleared, BP stabilized   Generalized ileus with fecal impaction: We've seen persistent ileus radiographically, persistent distention without peritonitis on exam. BMs are very watery, concern for encopresis.  - Surgery and GI consulted.  - Colonoscopy today per GI. - Continuing rifaximin 550 mg TID x 2 weeks for possible bacterial overgrowth.   Failure to thrive Rett syndrome with cognitive disability Developmental delay/nonverbal with oral phase dysphagia at baseline - Requires total care - Continue dysphagia 1 diet with aspiration precautions.   - Continue supportive care.   - Needs a safe discharge plan which TOC is  working on. Currently would be able to transfer 7/28.   Seizure disorder: 7/8 seizure-like activity lasted 45 seconds as per RN, no postictal symptoms. No recurrence currently.  - Continue ativan, trileptal, gabitril.     Depression/anxiety - Continue fluoxetine, ativan.   Candida vulvovaginitis: Treated with nystatin.    Iron deficiency: Given IV Venofer while hospitalized.  - Will continue enteric supplementation.    Vitamin D insufficiency - Supplement weekly.   Severe protein calorie malnutrition: Likely secondary to Rett syndrome. Alicia Duncan with severe muscle depletion, severe fat depletion, percent weight loss 11.6% x 6 months. - Nutritional supplementation with Ensure discontinued due to gaseous distention. Continue tube feeds.  - Continue dysphagia 1 diet  - Dietitian following.    Thrombocytopenia: Chronic issue, stable.  - Ok for VTE ppx with plt >50k.  DVT prophylaxis: Lovenox 30mg  Code Status: DNR Family Communication: None at bedside Disposition Plan:  Status is: Inpatient  Remains inpatient appropriate because:Inpatient level of care appropriate due to severity of illness  Dispo: The Alicia Duncan is from: Home              Anticipated d/c is to: ALF 7/28.              Alicia Duncan currently is not medically stable to d/c.   Difficult to place Alicia Duncan Yes  Consultants:  Surgery GI  Procedures:  Colonoscopy planned 7/23.   Antimicrobials: Ceftriaxone 7/19 - 7/13 Rifaximin 7/20 - 8/2  Subjective: Some NGT output, but no longer connected to suction this AM. No other overnight events reported. Pt remains nonverbal   Objective: Vitals:   10/26/20 2247 10/27/20 0451 10/27/20 0744 10/27/20  0746  BP: (!) 143/95 (!) 145/77 (!) 187/112 (!) 161/114  Pulse: 80 72 100 94  Resp: 18 20 20 20   Temp: 98.2 F (36.8 C) 97.6 F (36.4 C) 98.2 F (36.8 C) 98.2 F (36.8 C)  TempSrc:    Oral  SpO2: 100% 98% 100% 100%  Weight:      Height:        Intake/Output Summary  (Last 24 hours) at 10/27/2020 1159 Last data filed at 10/27/2020 1100 Gross per 24 hour  Intake 235.17 ml  Output 150 ml  Net 85.17 ml   Filed Weights   10/09/20 0500 10/09/20 0938 10/25/20 1203  Weight: 40.8 kg 39.3 kg 41.8 kg   Gen: 45 y.o. female in no distress Pulm: Nonlabored breathing room air. Clear. CV: Regular rate and rhythm. No murmur, rub, or gallop. No JVD, no pitting dependent edema. GI: Abdomen distended without tenderness, with active bowel sounds.  Ext: Warm, no deformities Skin: No new rashes, lesions or ulcers on visualized skin. Neuro: Alert, tracks, doesn't follow commands. Psych: Judgement and insight appear impaired, though pt is calm.    Data Reviewed: I have personally reviewed following labs and imaging studies  CBC: Recent Labs  Lab 10/22/20 0451 10/23/20 0918 10/24/20 0842 10/25/20 0411  WBC 8.3 8.6 5.9 4.9  NEUTROABS 6.1 7.2 4.1  --   HGB 11.7* 12.3 12.1 12.2  HCT 37.0 39.4 37.4 39.7  MCV 95.4 95.6 91.7 97.1  PLT 128* 121* 88* 102*   Basic Metabolic Panel: Recent Labs  Lab 10/22/20 0451 10/23/20 0918 10/25/20 0411 10/26/20 0550 10/27/20 0352  NA 137 139 140 137 139  K 4.0 3.5 4.2 3.5 3.8  CL 105 103 107 106 108  CO2 26 29 24 24 26   GLUCOSE 90 100* 76 88 83  BUN 22* 17 12 13 11   CREATININE 0.46 0.47 0.36* 0.35* 0.41*  CALCIUM 8.5* 8.9 8.2* 8.3* 8.6*  MG  --   --  2.5* 1.8 2.1  PHOS  --   --  2.6 3.5 3.2   GFR: Estimated Creatinine Clearance: 58 mL/min (A) (by C-G formula based on SCr of 0.41 mg/dL (L)). Liver Function Tests: Recent Labs  Lab 10/23/20 0918  AST 30  ALT 43  ALKPHOS 37*  BILITOT 0.5  PROT 6.5  ALBUMIN 3.3*   No results for input(s): LIPASE, AMYLASE in the last 168 hours. No results for input(s): AMMONIA in the last 168 hours. Coagulation Profile: No results for input(s): INR, PROTIME in the last 168 hours. Cardiac Enzymes: No results for input(s): CKTOTAL, CKMB, CKMBINDEX, TROPONINI in the last 168  hours. BNP (last 3 results) No results for input(s): PROBNP in the last 8760 hours. HbA1C: No results for input(s): HGBA1C in the last 72 hours. CBG: No results for input(s): GLUCAP in the last 168 hours. Lipid Profile: No results for input(s): CHOL, HDL, LDLCALC, TRIG, CHOLHDL, LDLDIRECT in the last 72 hours. Thyroid Function Tests: No results for input(s): TSH, T4TOTAL, FREET4, T3FREE, THYROIDAB in the last 72 hours. Anemia Panel: No results for input(s): VITAMINB12, FOLATE, FERRITIN, TIBC, IRON, RETICCTPCT in the last 72 hours.  Urine analysis:    Component Value Date/Time   COLORURINE YELLOW (A) 10/23/2020 0929   APPEARANCEUR HAZY (A) 10/23/2020 0929   LABSPEC 1.021 10/23/2020 0929   PHURINE 7.0 10/23/2020 0929   GLUCOSEU NEGATIVE 10/23/2020 0929   HGBUR NEGATIVE 10/23/2020 0929   BILIRUBINUR NEGATIVE 10/23/2020 0929   KETONESUR NEGATIVE 10/23/2020 0929   PROTEINUR  NEGATIVE 10/23/2020 0929   UROBILINOGEN 1.0 04/25/2013 1427   NITRITE NEGATIVE 10/23/2020 0929   LEUKOCYTESUR TRACE (A) 10/23/2020 0929   Recent Results (from the past 240 hour(s))  Culture, blood (Routine X 2) w Reflex to ID Panel     Status: None (Preliminary result)   Collection Time: 10/23/20  9:18 AM   Specimen: BLOOD  Result Value Ref Range Status   Specimen Description BLOOD RIGHT [facility]  Final   Special Requests   Final    BOTTLES DRAWN AEROBIC AND ANAEROBIC Blood Culture adequate volume   Culture   Final    NO GROWTH 4 DAYS Performed at [facility], [address], [place], [place] [address]    Report Status PENDING  Incomplete  Culture, blood (Routine X 2) w Reflex to ID Panel     Status: None (Preliminary result)   Collection Time: 10/23/20  9:18 AM   Specimen: BLOOD  Result Value Ref Range Status   Specimen Description BLOOD RIGHT FA  Final   Special Requests   Final    BOTTLES DRAWN AEROBIC AND ANAEROBIC Blood Culture adequate volume   Culture   Final    NO GROWTH 4 DAYS Performed  at [facility], [address], [place], [place] [address]    Report Status PENDING  Incomplete      Radiology Studies: DG Chest Port 1 View  Result Date: 10/26/2020 CLINICAL DATA:  45 year old female with NG placement. EXAM: PORTABLE CHEST 1 VIEW COMPARISON:  Chest radiograph dated 10/23/2020 FINDINGS: Enteric tube tip in the body of the stomach. Diffuse interstitial coarsening similar to prior radiograph. No focal consolidation, pleural effusion or pneumothorax. Stable cardiac silhouette. No acute osseous pathology. Spinal Harrington rods. IMPRESSION: Enteric tube with tip in the body of the stomach. Electronically Signed   By: Elgie Collard M.D.   On: 10/26/2020 22:36    Scheduled Meds:  baclofen  10 mg Oral TID   docusate  100 mg Oral BID   enoxaparin (LOVENOX) injection  30 mg Subcutaneous Q24H   feeding supplement  1 Bottle Oral TID BM   FLUoxetine  10 mg Oral Daily   LORazepam  1 mg Oral BID   OXcarbazepine  600 mg Oral BID   pantoprazole sodium  40 mg Oral Daily   phosphorus  500 mg Oral Daily   polyethylene glycol  17 g Oral BID   rifaximin  550 mg Oral TID   simethicone  40 mg Oral QID   tiaGABine  4 mg Oral BID   Vitamin D (Ergocalciferol)  50,000 Units Oral Q7 days   Continuous Infusions:  sodium chloride 10 mL/hr at 10/26/20 1829   sodium chloride     promethazine (PHENERGAN) injection (IM or IVPB) Stopped (10/22/20 1608)     LOS: 52 days   Time spent: 25 minutes.  Tyrone Nine, MD [facility] [email or web address] 10/27/2020, 11:59 AM

## 2020-10-27 NOTE — Anesthesia Preprocedure Evaluation (Addendum)
Anesthesia Evaluation  Patient identified by MRN, date of birth, ID band Patient awake    Reviewed: Allergy & Precautions, NPO status , Patient's Chart, lab work & pertinent test results  History of Anesthesia Complications Negative for: history of anesthetic complications  Airway   TM Distance: <3 FB    Comment: Unable to fully assess d/t pt lack of cooperation- she is non verbal and not following any requests Dental   Pulmonary neg pulmonary ROS,    Pulmonary exam normal breath sounds clear to auscultation       Cardiovascular Exercise Tolerance: Good METS (Pt not active)negative cardio ROS Normal cardiovascular exam Rhythm:Regular Rate:Normal     Neuro/Psych Seizures -,  Rhett's Syndrome- non verbal negative psych ROS   GI/Hepatic negative GI ROS, Neg liver ROS,   Endo/Other  negative endocrine ROS  Renal/GU negative Renal ROS  negative genitourinary   Musculoskeletal negative musculoskeletal ROS (+)   Abdominal   Peds  Hematology negative hematology ROS (+)   Anesthesia Other Findings   Reproductive/Obstetrics negative OB ROS                            Anesthesia Physical Anesthesia Plan  ASA: 3 and emergent  Anesthesia Plan: MAC   Post-op Pain Management:    Induction: Intravenous  PONV Risk Score and Plan:   Airway Management Planned: Natural Airway and Nasal Cannula  Additional Equipment:   Intra-op Plan:   Post-operative Plan:   Informed Consent: I have reviewed the patients History and Physical, chart, labs and discussed the procedure including the risks, benefits and alternatives for the proposed anesthesia with the patient or authorized representative who has indicated his/her understanding and acceptance.     Dental Advisory Given  Plan Discussed with: Anesthesiologist, CRNA and Surgeon  Anesthesia Plan Comments: (Verbal consent obtained over the phone w/  Pt's guardian (brother) who consented for risks of anesthesia including but not limited to:  - adverse reactions to medications - damage to eyes, teeth, lips or other oral mucosa - nerve damage due to positioning  - sore throat or hoarseness - Damage to heart, brain, nerves, lungs, other parts of body or loss of life  Pt's brother voiced understanding.)      Anesthesia Quick Evaluation

## 2020-10-27 NOTE — Op Note (Signed)
[facility]enter[facility] Patient Name: Alicia Duncan Procedure Date: 10/27/2020 12:10 PM MRN: [id number] Account #: [id number] Date of Birth: 08/15/1975 Admit Type: Inpatient Age: 45 Room: [facility] ENDO ROOM 1 Gender: Female Note Status: Finalized Procedure:             Colonoscopy Indications:           Abnormal CT of the GI tract, , ileus Providers:             Toney Reil MD, MD Referring MD:          Rhona Leavens. Burnett Sheng, MD (Referring MD) Medicines:             General Anesthesia Complications:         No immediate complications. Estimated blood loss: None. Procedure:             Pre-Anesthesia Assessment:                        - Prior to the procedure, a History and Physical was                         performed, and patient medications and allergies were                         reviewed. The patient is unable to give consent                         secondary to the patient being legally incompetent to                         consent. The risks and benefits of the procedure and                         the sedation options and risks were discussed with the                         patient's guardian. All questions were answered and                         informed consent was obtained. Patient identification                         and proposed procedure were verified by the physician,                         the nurse, the anesthesiologist, the anesthetist and                         the technician in the pre-procedure area in the                         procedure room in the endoscopy suite. Mental Status                         Examination: alert but confused. Airway Examination:                         normal oropharyngeal airway and neck mobility.  Respiratory Examination: clear to auscultation. CV                         Examination: normal. Prophylactic Antibiotics: The                         patient does not require  prophylactic antibiotics.                         Prior Anticoagulants: The patient has taken no                         previous anticoagulant or antiplatelet agents. ASA                         Grade Assessment: III - A patient with severe systemic                         disease. After reviewing the risks and benefits, the                         patient was deemed in satisfactory condition to                         undergo the procedure. The anesthesia plan was to use                         general anesthesia. Immediately prior to                         administration of medications, the patient was                         re-assessed for adequacy to receive sedatives. The                         heart rate, respiratory rate, oxygen saturations,                         blood pressure, adequacy of pulmonary ventilation, and                         response to care were monitored throughout the                         procedure. The physical status of the patient was                         re-assessed after the procedure.                        After obtaining informed consent, the colonoscope was                         passed under direct vision. Throughout the procedure,                         the patient's blood pressure, pulse, and oxygen  saturations were monitored continuously. The                         Colonoscope was introduced through the anus and                         advanced to the the cecum, identified by appendiceal                         orifice and ileocecal valve. The colonoscopy was                         performed with moderate difficulty due to significant                         looping, a tortuous colon and the patient's body                         habitus. Successful completion of the procedure was                         aided by applying abdominal pressure. The patient                         tolerated the procedure well. The  quality of the bowel                         preparation was poor. Findings:      Rectal tube was removed, patient pulled out the NG tube as well.      The perianal and digital rectal examinations were normal. Pertinent       negatives include normal sphincter tone and no palpable rectal lesions.      The entire examined colon appeared normal. Mildly dilated and air from       entire colon was suctioned out      The retroflexed view of the distal rectum and anal verge was normal and       showed no anal or rectal abnormalities.      Normal mucosa was found in the entire colon. Impression:            - Preparation of the colon was poor.                        - The entire examined colon is normal.                        - The distal rectum and anal verge are normal on                         retroflexion view.                        - Normal mucosa in the entire examined colon.                        - No specimens collected. Recommendation:        - Return patient to hospital ward for ongoing care.                        -  Resume regular diet today.                        - Continue present medications. Procedure Code(s):     --- Professional ---                        [phone], Colonoscopy, flexible; diagnostic, including                         collection of specimen(s) by brushing or washing, when                         performed (separate procedure) Diagnosis Code(s):     --- Professional ---                        R93.3, Abnormal findings on diagnostic imaging of                         other parts of digestive tract CPT copyright 2019 [employer]. All rights reserved. The codes documented in this report are preliminary and upon coder review may  be revised to meet current compliance requirements. Dr. Libby Maw Toney Reil MD, MD 10/27/2020 12:40:36 PM This report has been signed electronically. Number of Addenda: 0 Note Initiated On: 10/27/2020 12:10  PM Scope Withdrawal Time: 0 hours 9 minutes 4 seconds  Total Procedure Duration: 0 hours 16 minutes 26 seconds  Estimated Blood Loss:  Estimated blood loss: none.      [facility]

## 2020-10-28 DIAGNOSIS — K5900 Constipation, unspecified: Secondary | ICD-10-CM | POA: Diagnosis not present

## 2020-10-28 DIAGNOSIS — F842 Rett's syndrome: Secondary | ICD-10-CM | POA: Diagnosis not present

## 2020-10-28 DIAGNOSIS — K5641 Fecal impaction: Secondary | ICD-10-CM | POA: Diagnosis not present

## 2020-10-28 DIAGNOSIS — R627 Adult failure to thrive: Secondary | ICD-10-CM | POA: Diagnosis not present

## 2020-10-28 LAB — BASIC METABOLIC PANEL
Anion gap: 6 (ref 5–15)
BUN: 12 mg/dL (ref 6–20)
CO2: 26 mmol/L (ref 22–32)
Calcium: 8.6 mg/dL — ABNORMAL LOW (ref 8.9–10.3)
Chloride: 107 mmol/L (ref 98–111)
Creatinine, Ser: 0.46 mg/dL (ref 0.44–1.00)
GFR, Estimated: >60 mL/min (ref >60)
Glucose, Bld: 86 mg/dL (ref 70–99)
Potassium: 3.6 mmol/L (ref 3.5–5.1)
Sodium: 139 mmol/L (ref 135–145)

## 2020-10-28 LAB — CBC
HCT: 36.9 % (ref 36.0–46.0)
Hemoglobin: 11.9 g/dL — ABNORMAL LOW (ref 12.0–15.0)
MCH: 30.7 pg (ref 26.0–34.0)
MCHC: 32.2 g/dL (ref 30.0–36.0)
MCV: 95.1 fL (ref 80.0–100.0)
Platelets: 131 10*3/uL — ABNORMAL LOW (ref 150–400)
RBC: 3.88 MIL/uL (ref 3.87–5.11)
RDW: 19.2 % — ABNORMAL HIGH (ref 11.5–15.5)
WBC: 5.8 10*3/uL (ref 4.0–10.5)
nRBC: 0 % (ref 0.0–0.2)

## 2020-10-28 LAB — PHOSPHORUS: Phosphorus: 4.1 mg/dL (ref 2.5–4.6)

## 2020-10-28 LAB — MAGNESIUM: Magnesium: 1.9 mg/dL (ref 1.7–2.4)

## 2020-10-28 NOTE — Progress Notes (Signed)
PROGRESS NOTE  Alicia Duncan  MWN:[id number] DOB: March 31, 1976 DOA: 09/03/2020 PCP: [name], MD   Brief Narrative: Alicia Duncan is a 45 y.o. female with PMH significant for Rett syndrome, cognitive impairment, developmental delay, nonverbal, seizure disorder and debility with complete dependence for ADLs. 5/30, patient was brought to ED by EMS due to unsafe home environment.  Patient's caregiver went on vacation for several days and family at home was not able to adequately take care of the patient.  Reportedly, patient's father ha advanced dementia and brother has schizophrenia.  Patient is currently waiting for appropriate placement.  The patient developed ileus with possible fecal impaction for which NG tube was placed and bowel regimen has been given. Colonoscopy 7/23 revealed no impaction, normal colon with poor prep..  Assessment & Plan: Principal Problem:   Failure to thrive in adult Active Problems:   Rett syndrome   Seizure disorder (HCC)   Severe protein-calorie malnutrition (HCC)   Constipation   Vaginal candidiasis   Fecal impaction (HCC)   Depression   Ileus (HCC)  Sepsis due to aspiration pneumonia: CT with R > LLL opacities and pt with known dysphagia: On 7/19 (not POA) with fever 101, tachycardia, lactic acidosis. - Completed ceftriaxone x5 days (7/19 - 7/23).  - Lactic acid cleared, BP stabilized   Generalized ileus with fecal impaction:  - Colonoscopy 7/23 without fecal impaction. Continue bowel regimen. - Continuing rifaximin 550 mg TID x 2 weeks for possible bacterial overgrowth.   Failure to thrive Rett syndrome with cognitive disability Developmental delay/nonverbal with oral phase dysphagia at baseline - Requires total care - Continue dysphagia 1 diet with aspiration precautions.   - Continue supportive care.   - Needs a safe discharge plan which TOC is working on. Currently would be able to transfer 7/28.   Seizure disorder: 7/8 seizure-like  activity lasted 45 seconds as per RN, no postictal symptoms. No recurrence currently.  - Continue ativan, trileptal, gabitril.     Depression/anxiety - Continue fluoxetine, ativan.   Candida vulvovaginitis: Treated with nystatin.    Iron deficiency: Given IV Venofer while hospitalized.  - Will continue enteric supplementation.    Vitamin D insufficiency - Supplement weekly.   Severe protein calorie malnutrition: Likely secondary to Rett syndrome. Patient with severe muscle depletion, severe fat depletion, percent weight loss 11.6% x 6 months. - Nutritional supplementation as much as possible. - Continue dysphagia 1 diet  - Dietitian following.    Thrombocytopenia: Chronic issue, stable. Slightly improved today.  - Ok for VTE ppx with plt >50k.  DVT prophylaxis: Lovenox 30mg  Code Status: DNR Family Communication: None at bedside Disposition Plan:  Status is: Inpatient  Remains inpatient appropriate because:Inpatient level of care appropriate due to severity of illness  Dispo: The patient is from: Home              Anticipated d/c is to: ALF 7/28.              Patient currently is not medically stable to d/c.   Difficult to place patient Yes  Consultants:  Surgery GI  Procedures:  Colonoscopy 7/23 Dr. 8/23:  Impression:    - Preparation of the colon was poor.                        - The entire examined colon is normal.                        -  The distal rectum and anal verge are normal on                        retroflexion view.                        - Normal mucosa in the entire examined colon.                        - No specimens collected. Recommendation: - Return patient to hospital ward for ongoing care.                        - Resume regular diet today.                        - Continue present medications.   Antimicrobials: Ceftriaxone 7/19 - 7/13 Rifaximin 7/20 - 8/2  Subjective: Nonverbal, no events reported overnight.  Objective: Vitals:    10/27/20 2106 10/28/20 0531 10/28/20 0836 10/28/20 1217  BP: (!) 143/82 (!) 101/47 135/86 128/73  Pulse: 93 63 86 78  Resp: 18 18 17 18   Temp: 98 F (36.7 C) (!) 97.4 F (36.3 C) 97.8 F (36.6 C) 98.1 F (36.7 C)  TempSrc:      SpO2: (!) 89% 100% 99% 100%  Weight:      Height:        Intake/Output Summary (Last 24 hours) at 10/28/2020 1310 Last data filed at 10/28/2020 1020 Gross per 24 hour  Intake 240 ml  Output --  Net 240 ml   Filed Weights   10/09/20 0938 10/25/20 1203 10/27/20 1211  Weight: 39.3 kg 41.8 kg 41.8 kg   Gen: 45 y.o. female in no distress Pulm: Nonlabored breathing room air. Clear. CV: Regular rate and rhythm. No murmur, rub, or gallop. No JVD, no dependent edema. GI: Abdomen still distended but nontender, with normoactive bowel sounds.  Ext: Warm, no deformities. Decreased muscle bulk. Skin: No rashes, lesions or ulcers on visualized skin. Neuro: Alert, nonverbal, not cooperative with exam. Psych:  UTD  Data Reviewed: I have personally reviewed following labs and imaging studies  CBC: Recent Labs  Lab 10/22/20 0451 10/23/20 0918 10/24/20 0842 10/25/20 0411 10/28/20 0448  WBC 8.3 8.6 5.9 4.9 5.8  NEUTROABS 6.1 7.2 4.1  --   --   HGB 11.7* 12.3 12.1 12.2 11.9*  HCT 37.0 39.4 37.4 39.7 36.9  MCV 95.4 95.6 91.7 97.1 95.1  PLT 128* 121* 88* 102* 131*   Basic Metabolic Panel: Recent Labs  Lab 10/23/20 0918 10/25/20 0411 10/26/20 0550 10/27/20 0352 10/28/20 0448  NA 139 140 137 139 139  K 3.5 4.2 3.5 3.8 3.6  CL 103 107 106 108 107  CO2 29 24 24 26 26   GLUCOSE 100* 76 88 83 86  BUN 17 12 13 11 12   CREATININE 0.47 0.36* 0.35* 0.41* 0.46  CALCIUM 8.9 8.2* 8.3* 8.6* 8.6*  MG  --  2.5* 1.8 2.1 1.9  PHOS  --  2.6 3.5 3.2 4.1   GFR: Estimated Creatinine Clearance: 58 mL/min (by C-G formula based on SCr of 0.46 mg/dL). Liver Function Tests: Recent Labs  Lab 10/23/20 0918  AST 30  ALT 43  ALKPHOS 37*  BILITOT 0.5  PROT 6.5  ALBUMIN  3.3*   No results for input(s): LIPASE, AMYLASE in the last 168 hours. No results for  input(s): AMMONIA in the last 168 hours. Coagulation Profile: No results for input(s): INR, PROTIME in the last 168 hours. Cardiac Enzymes: No results for input(s): CKTOTAL, CKMB, CKMBINDEX, TROPONINI in the last 168 hours. BNP (last 3 results) No results for input(s): PROBNP in the last 8760 hours. HbA1C: No results for input(s): HGBA1C in the last 72 hours. CBG: No results for input(s): GLUCAP in the last 168 hours. Lipid Profile: No results for input(s): CHOL, HDL, LDLCALC, TRIG, CHOLHDL, LDLDIRECT in the last 72 hours. Thyroid Function Tests: No results for input(s): TSH, T4TOTAL, FREET4, T3FREE, THYROIDAB in the last 72 hours. Anemia Panel: No results for input(s): VITAMINB12, FOLATE, FERRITIN, TIBC, IRON, RETICCTPCT in the last 72 hours.  Urine analysis:    Component Value Date/Time   COLORURINE YELLOW (A) 10/23/2020 0929   APPEARANCEUR HAZY (A) 10/23/2020 0929   LABSPEC 1.021 10/23/2020 0929   PHURINE 7.0 10/23/2020 0929   GLUCOSEU NEGATIVE 10/23/2020 0929   HGBUR NEGATIVE 10/23/2020 0929   BILIRUBINUR NEGATIVE 10/23/2020 0929   KETONESUR NEGATIVE 10/23/2020 0929   PROTEINUR NEGATIVE 10/23/2020 0929   UROBILINOGEN 1.0 04/25/2013 1427   NITRITE NEGATIVE 10/23/2020 0929   LEUKOCYTESUR TRACE (A) 10/23/2020 0929   Recent Results (from the past 240 hour(s))  Culture, blood (Routine X 2) w Reflex to ID Panel     Status: None (Preliminary result)   Collection Time: 10/23/20  9:18 AM   Specimen: BLOOD  Result Value Ref Range Status   Specimen Description BLOOD RIGHT [facility]  Final   Special Requests   Final    BOTTLES DRAWN AEROBIC AND ANAEROBIC Blood Culture adequate volume   Culture   Final    NO GROWTH 4 DAYS Performed at [facility], [address], [place], [place] [address]    Report Status PENDING  Incomplete  Culture, blood (Routine X 2) w Reflex to ID Panel     Status:  None (Preliminary result)   Collection Time: 10/23/20  9:18 AM   Specimen: BLOOD  Result Value Ref Range Status   Specimen Description BLOOD RIGHT FA  Final   Special Requests   Final    BOTTLES DRAWN AEROBIC AND ANAEROBIC Blood Culture adequate volume   Culture   Final    NO GROWTH 4 DAYS Performed at [facility], [address] [place], [place] [address]    Report Status PENDING  Incomplete      Radiology Studies: DG Chest Port 1 View  Result Date: 10/26/2020 CLINICAL DATA:  45 year old female with NG placement. EXAM: PORTABLE CHEST 1 VIEW COMPARISON:  Chest radiograph dated 10/23/2020 FINDINGS: Enteric tube tip in the body of the stomach. Diffuse interstitial coarsening similar to prior radiograph. No focal consolidation, pleural effusion or pneumothorax. Stable cardiac silhouette. No acute osseous pathology. Spinal Harrington rods. IMPRESSION: Enteric tube with tip in the body of the stomach. Electronically Signed   By: Elgie Collard M.D.   On: 10/26/2020 22:36    Scheduled Meds:  baclofen  10 mg Oral TID   docusate  100 mg Oral BID   enoxaparin (LOVENOX) injection  30 mg Subcutaneous Q24H   feeding supplement  1 Bottle Oral TID BM   FLUoxetine  10 mg Oral Daily   LORazepam  1 mg Oral BID   OXcarbazepine  600 mg Oral BID   pantoprazole sodium  40 mg Oral Daily   phosphorus  500 mg Oral Daily   polyethylene glycol  17 g Oral BID   rifaximin  550 mg Oral TID   simethicone  40 mg Oral QID   tiaGABine  4 mg Oral BID   Vitamin D (Ergocalciferol)  50,000 Units Oral Q7 days   Continuous Infusions:  sodium chloride 10 mL/hr at 10/26/20 1829   promethazine (PHENERGAN) injection (IM or IVPB) Stopped (10/22/20 1608)     LOS: 53 days   Time spent: 25 minutes.  Tyrone Nineyan B Terin Dierolf, MD [facility] [email or web address] 10/28/2020, 1:10 PM

## 2020-10-29 ENCOUNTER — Encounter: Payer: Self-pay | Admitting: Gastroenterology

## 2020-10-29 DIAGNOSIS — R627 Adult failure to thrive: Secondary | ICD-10-CM | POA: Diagnosis not present

## 2020-10-29 LAB — BASIC METABOLIC PANEL
Anion gap: 7 (ref 5–15)
BUN: 25 mg/dL — ABNORMAL HIGH (ref 6–20)
CO2: 27 mmol/L (ref 22–32)
Calcium: 8.7 mg/dL — ABNORMAL LOW (ref 8.9–10.3)
Chloride: 108 mmol/L (ref 98–111)
Creatinine, Ser: 0.41 mg/dL — ABNORMAL LOW (ref 0.44–1.00)
GFR, Estimated: >60 mL/min (ref >60)
Glucose, Bld: 80 mg/dL (ref 70–99)
Potassium: 4.1 mmol/L (ref 3.5–5.1)
Sodium: 142 mmol/L (ref 135–145)

## 2020-10-29 LAB — CULTURE, BLOOD (ROUTINE X 2)
Culture: NO GROWTH
Culture: NO GROWTH
Special Requests: ADEQUATE
Special Requests: ADEQUATE

## 2020-10-29 LAB — PHOSPHORUS: Phosphorus: 4.6 mg/dL (ref 2.5–4.6)

## 2020-10-29 LAB — MAGNESIUM: Magnesium: 1.9 mg/dL (ref 1.7–2.4)

## 2020-10-29 NOTE — Progress Notes (Signed)
PROGRESS NOTE  SORAYA PAQUETTE  DOB: Aug 24, 1975  PCP: [name], MD LPF:[id number]  DOA: 09/03/2020  LOS: 54 days  Hospital Day: 51   Chief Complaint  Patient presents with   social work    Brief narrative: MARYRUTH APPLE is a 45 y.o. female with PMH significant for Rett syndrome, cognitive impairment, developmental delay, nonverbal, seizure disorder and debility with complete dependence for ADLs. 5/30, patient was brought to ED by EMS due to unsafe home environment.  Patient's caregiver went on vacation for several days and family at home was not able to adequately take care of the patient.  Reportedly, patient's father has advanced dementia and brother has schizophrenia.  Patient is currently waiting for appropriate placement.  Subjective: Patient was seen and examined this morning.  Alert, awake, cognitive impairment at baseline. Vital signs stable.  Assessment/Plan: Sepsis due to aspiration pneumonia- on 7/19 -Patient with known dysphagia.  CT chest with right more than left lower lobe opacities -completed 5 days of antibiotics with IV ceftriaxone.  Sepsis parameters improved. Recent Labs  Lab 10/23/20 0918 10/23/20 1356 10/24/20 0842 10/25/20 0411 10/28/20 0448  WBC 8.6  --  5.9 4.9 5.8  LATICACIDVEN 2.8* 1.9  --   --   --    Generalized ileus  -GI and general surgery consultation appreciated. -Underwent colonoscopy on 7/23, did not show fecal impaction. -Continue bowel regimen with rifaximin 550 mg 3 times daily for 2 weeks for possible bacterial overgrowth   Failure to thrive Rett syndrome with cognitive disability Developmental delay/nonverbal with oral phase dysphagia at baseline -requires total care -Continue current dysphagia 1 diet with aspiration precautions.   -Continue supportive care.   -Needs a safe discharge plan which TOC is working on.  History of seizure disorder -7/8 seizure-like activity lasted 45 seconds as per RN, no postictal  symptoms -Stable. -Continue Ativan, Trileptal, Gabitril.     Depression/anxiety -Stable.   -Continue fluoxetine, Ativan.   Candida vulvovaginitis -Status post nystatin.    Iron deficiency -Given IV Venofer in the hospital.  Oral supplement started.  Vitamin D insufficiency -started oral supplement.  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 -Patient with severe muscle depletion, severe fat depletion, percent weight loss 11.6% x 6 months. -Nutritional supplementation with Ensure discontinued due to gaseous distention.   -Continue dysphagia 1 diet  -Dietitian following.     Mobility: Encourage ambulation if able to follow commands Code Status:   Code Status: DNR  Nutritional status: Body mass index is 18.61 kg/m. Nutrition Problem: Severe Malnutrition Etiology: chronic illness (Rett syndrome) Signs/Symptoms: severe muscle depletion, severe fat depletion, percent weight loss (11.6% x 6 months) Percent weight loss: 11.6 % (x 6 months) Diet:  Diet Order             DIET - DYS 1 Room service appropriate? Yes; Fluid consistency: Thin  Diet effective now           Diet - low sodium heart healthy                  DVT prophylaxis:  enoxaparin (LOVENOX) injection 30 mg Start: 09/03/20 2200   Antimicrobials: Completed IV Rocephin Fluid: Not on IV fluid Consultants: General surgery and GI signed off. Family Communication: None at bedside  Status is: Inpatient  Remains inpatient appropriate because: Pending placement.  Dispo: The patient is from: Home              Anticipated d/c is to: Facility discharge.  Tentative plan to discharge on 7/28 per case management              Patient currently is medically stable to d/c.   Difficult to place patient Yes     Infusions:   promethazine (PHENERGAN) injection (IM or IVPB) Stopped (10/22/20 1608)    Scheduled Meds:  baclofen  10 mg Oral TID   docusate  100 mg Oral BID   enoxaparin  (LOVENOX) injection  30 mg Subcutaneous Q24H   feeding supplement  1 Bottle Oral TID BM   FLUoxetine  10 mg Oral Daily   LORazepam  1 mg Oral BID   OXcarbazepine  600 mg Oral BID   pantoprazole sodium  40 mg Oral Daily   polyethylene glycol  17 g Oral BID   rifaximin  550 mg Oral TID   simethicone  40 mg Oral QID   tiaGABine  4 mg Oral BID   Vitamin D (Ergocalciferol)  50,000 Units Oral Q7 days    Antimicrobials: Anti-infectives (From admission, onward)    Start     Dose/Rate Route Frequency Ordered Stop   10/25/20 1200  cefTRIAXone (ROCEPHIN) 1 g in sodium chloride 0.9 % 100 mL IVPB        1 g 200 mL/hr over 30 Minutes Intravenous Every 24 hours 10/24/20 1404 10/27/20 1040   10/24/20 1600  rifaximin (XIFAXAN) tablet 550 mg        550 mg Oral 3 times daily 10/24/20 1407 11/07/20 1559   10/23/20 1130  cefTRIAXone (ROCEPHIN) 1 g in sodium chloride 0.9 % 100 mL IVPB  Status:  Discontinued        1 g 200 mL/hr over 30 Minutes Intravenous Every 24 hours 10/23/20 1030 10/24/20 1404       PRN meds: acetaminophen **OR** acetaminophen, bisacodyl, ondansetron **OR** ondansetron (ZOFRAN) IV, promethazine (PHENERGAN) injection (IM or IVPB)   Objective: Vitals:   10/29/20 0915 10/29/20 1136  BP: 135/81 126/81  Pulse: 88 98  Resp: 16 16  Temp: 97.6 F (36.4 C) 98.3 F (36.8 C)  SpO2: 96% 97%    Intake/Output Summary (Last 24 hours) at 10/29/2020 1402 Last data filed at 10/29/2020 1037 Gross per 24 hour  Intake 480 ml  Output --  Net 480 ml   Filed Weights   10/09/20 0938 10/25/20 1203 10/27/20 1211  Weight: 39.3 kg 41.8 kg 41.8 kg   Weight change:  Body mass index is 18.61 kg/m.   Physical Exam: General exam: Middle-aged Caucasian female. Not in distress no new symptoms. Skin: No rashes, lesions or ulcers. HEENT: Atraumatic, normocephalic, no obvious bleeding Lungs: Clear to auscultation bilaterally CVS: Regular rate and rhythm, no murmur GI/Abd soft, diffusely  distended, bowel sound present, nontender CNS: Alert, awake, unable to follow commands because of cognitive impairment Psychiatry: Baseline chronic psychiatric issues Extremities: No pedal edema, no calf tenderness  Data Review: I have personally reviewed the laboratory data and studies available.  Recent Labs  Lab 10/23/20 0918 10/24/20 0842 10/25/20 0411 10/28/20 0448  WBC 8.6 5.9 4.9 5.8  NEUTROABS 7.2 4.1  --   --   HGB 12.3 12.1 12.2 11.9*  HCT 39.4 37.4 39.7 36.9  MCV 95.6 91.7 97.1 95.1  PLT 121* 88* 102* 131*    Recent Labs  Lab 10/25/20 0411 10/26/20 0550 10/27/20 0352 10/28/20 0448 10/29/20 0518  NA 140 137 139 139 142  K 4.2 3.5 3.8 3.6 4.1  CL 107 106 108 107 108  CO2 24  24 26 26 27   GLUCOSE 76 88 83 86 80  BUN 12 13 11 12  25*  CREATININE 0.36* 0.35* 0.41* 0.46 0.41*  CALCIUM 8.2* 8.3* 8.6* 8.6* 8.7*  MG 2.5* 1.8 2.1 1.9 1.9  PHOS 2.6 3.5 3.2 4.1 4.6     F/u labs none Unresulted Labs (From admission, onward)    None       Signed, , MD [facility] 10/29/2020

## 2020-10-30 DIAGNOSIS — R627 Adult failure to thrive: Secondary | ICD-10-CM | POA: Diagnosis not present

## 2020-10-30 NOTE — Progress Notes (Signed)
Dr Pola Corn made aware that pt running fever of 100, also noted that likely because of this pts HR slightly elevated, PRN tylenol given, MD acknowledged

## 2020-10-30 NOTE — Progress Notes (Signed)
PROGRESS NOTE  Alicia Duncan  DOB: March 27, 1976  PCP: [name], MD VOZ:[id number]  DOA: 09/03/2020  LOS: 55 days  Hospital Day: 9   Chief Complaint  Patient presents with   social work    Brief narrative: Alicia Duncan is a 45 y.o. female with PMH significant for Rett syndrome, cognitive impairment, developmental delay, nonverbal, seizure disorder and debility with complete dependence for ADLs. 5/30, patient was brought to ED by EMS due to unsafe home environment.  Patient's caregiver went on vacation for several days and family at home was not able to adequately take care of the patient.  Reportedly, patient's father has advanced dementia and brother has schizophrenia.  Patient is currently waiting for appropriate placement.  Subjective: Patient was seen and examined this morning.  Alert, awake, cognitive impairment at baseline. Vitals reviewed.  She had a temperature of 100 this morning with tachycardia at 110.  Assessment/Plan: Sepsis due to aspiration pneumonia- on 7/19 -Patient with known dysphagia.  CT chest with right more than left lower lobe opacities -completed 5 days of antibiotics with IV ceftriaxone.  Sepsis parameters improved.  Patient has low-grade temperature again.  Continue to monitor. Recent Labs  Lab 10/23/20 1356 10/24/20 0842 10/25/20 0411 10/28/20 0448  WBC  --  5.9 4.9 5.8  LATICACIDVEN 1.9  --   --   --    Generalized ileus  -GI and general surgery consultation appreciated. -Underwent colonoscopy on 7/23, did not show fecal impaction. -Continue bowel regimen with rifaximin 550 mg 3 times daily for 2 weeks for possible bacterial overgrowth  Failure to thrive Rett syndrome with cognitive disability Developmental delay/nonverbal with oral phase dysphagia at baseline -requires total care -Continue current dysphagia 1 diet with aspiration precautions.   -Continue supportive care.   -Needs a safe discharge plan which TOC is working on.   Tentative plan to discharge on 7/28.  History of seizure disorder -7/8 seizure-like activity lasted 45 seconds as per RN, no postictal symptoms -Stable. -Continue Ativan, Trileptal, Gabitril.     Depression/anxiety -Stable.   -Continue fluoxetine, Ativan.   Candida vulvovaginitis -Status post nystatin.    Iron deficiency -Given IV Venofer in the hospital.  Oral supplement started.  Vitamin D insufficiency -started oral supplement.  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 -Patient with severe muscle depletion, severe fat depletion, percent weight loss 11.6% x 6 months. -Nutritional supplementation with Ensure discontinued due to gaseous distention.   -Continue dysphagia 1 diet  -Dietitian following.     Mobility: Encourage ambulation if able to follow commands Code Status:   Code Status: DNR  Nutritional status: Body mass index is 18.61 kg/m. Nutrition Problem: Severe Malnutrition Etiology: chronic illness (Rett syndrome) Signs/Symptoms: severe muscle depletion, severe fat depletion, percent weight loss (11.6% x 6 months) Percent weight loss: 11.6 % (x 6 months) Diet:  Diet Order             DIET - DYS 1 Room service appropriate? Yes; Fluid consistency: Thin  Diet effective now           Diet - low sodium heart healthy                  DVT prophylaxis:  enoxaparin (LOVENOX) injection 30 mg Start: 09/03/20 2200   Antimicrobials: Completed IV Rocephin Fluid: Not on IV fluid Consultants: General surgery and GI signed off. Family Communication: None at bedside  Status is: Inpatient  Remains inpatient appropriate because: Pending placement.  Dispo:  The patient is from: Home              Anticipated d/c is to: Facility discharge.  Tentative plan to discharge on 7/28 per case management              Patient currently is medically stable to d/c.   Difficult to place patient Yes     Infusions:   promethazine (PHENERGAN)  injection (IM or IVPB) Stopped (10/22/20 1608)    Scheduled Meds:  baclofen  10 mg Oral TID   docusate  100 mg Oral BID   enoxaparin (LOVENOX) injection  30 mg Subcutaneous Q24H   feeding supplement  1 Bottle Oral TID BM   FLUoxetine  10 mg Oral Daily   LORazepam  1 mg Oral BID   OXcarbazepine  600 mg Oral BID   pantoprazole sodium  40 mg Oral Daily   polyethylene glycol  17 g Oral BID   rifaximin  550 mg Oral TID   simethicone  40 mg Oral QID   tiaGABine  4 mg Oral BID   Vitamin D (Ergocalciferol)  50,000 Units Oral Q7 days    Antimicrobials: Anti-infectives (From admission, onward)    Start     Dose/Rate Route Frequency Ordered Stop   10/25/20 1200  cefTRIAXone (ROCEPHIN) 1 g in sodium chloride 0.9 % 100 mL IVPB        1 g 200 mL/hr over 30 Minutes Intravenous Every 24 hours 10/24/20 1404 10/27/20 1040   10/24/20 1600  rifaximin (XIFAXAN) tablet 550 mg        550 mg Oral 3 times daily 10/24/20 1407 11/07/20 1559   10/23/20 1130  cefTRIAXone (ROCEPHIN) 1 g in sodium chloride 0.9 % 100 mL IVPB  Status:  Discontinued        1 g 200 mL/hr over 30 Minutes Intravenous Every 24 hours 10/23/20 1030 10/24/20 1404       PRN meds: acetaminophen **OR** acetaminophen, bisacodyl, ondansetron **OR** ondansetron (ZOFRAN) IV, promethazine (PHENERGAN) injection (IM or IVPB)   Objective: Vitals:   10/30/20 0644 10/30/20 0750  BP: 114/77 111/60  Pulse: (!) 106 (!) 110  Resp: 16 20  Temp: 99.3 F (37.4 C) 100 F (37.8 C)  SpO2: 94% 92%    Intake/Output Summary (Last 24 hours) at 10/30/2020 1011 Last data filed at 10/29/2020 1916 Gross per 24 hour  Intake 360 ml  Output --  Net 360 ml    Filed Weights   10/09/20 0938 10/25/20 1203 10/27/20 1211  Weight: 39.3 kg 41.8 kg 41.8 kg   Weight change:  Body mass index is 18.61 kg/m.   Physical Exam: General exam: Middle-aged Caucasian female. Not in distress no new symptoms. Skin: No rashes, lesions or ulcers. HEENT: Atraumatic,  normocephalic, no obvious bleeding Lungs: Clear to auscultation bilaterally CVS: Regular rate and rhythm, no murmur GI/Abd soft, diffusely distended, bowel sound present, nontender CNS: Alert, awake, unable to follow commands because of cognitive impairment Psychiatry: Baseline chronic psychiatric issues Extremities: No pedal edema, no calf tenderness  Data Review: I have personally reviewed the laboratory data and studies available.  Recent Labs  Lab 10/24/20 0842 10/25/20 0411 10/28/20 0448  WBC 5.9 4.9 5.8  NEUTROABS 4.1  --   --   HGB 12.1 12.2 11.9*  HCT 37.4 39.7 36.9  MCV 91.7 97.1 95.1  PLT 88* 102* 131*     Recent Labs  Lab 10/25/20 0411 10/26/20 0550 10/27/20 0352 10/28/20 0448 10/29/20 0518  NA 140  137 139 139 142  K 4.2 3.5 3.8 3.6 4.1  CL 107 106 108 107 108  CO2 24 24 26 26 27   GLUCOSE 76 88 83 86 80  BUN 12 13 11 12  25*  CREATININE 0.36* 0.35* 0.41* 0.46 0.41*  CALCIUM 8.2* 8.3* 8.6* 8.6* 8.7*  MG 2.5* 1.8 2.1 1.9 1.9  PHOS 2.6 3.5 3.2 4.1 4.6      F/u labs none Unresulted Labs (From admission, onward)    None       Signed, , MD [facility] 10/30/2020

## 2020-10-30 NOTE — TOC Progression Note (Addendum)
Transition of Care [facility]) - Progression Note    Patient Details  Name: IONA STAY MRN: [id number] Date of Birth: Aug 17, 1975  Transition of Care [facility]) CM/SW Contact  Allayne Butcher, RN Phone Number: 10/30/2020, 9:29 AM  Clinical Narrative:    RNCM reached out to Beckie Salts to confirm discharge plans for Thursday.  Patient will be going to [facility] with Josefa Half on Thursday, no FL2 needed but they do need a list of her discharge medications so they can have them when she arrives and Selena Batten will find out from [employer] if she is planning on picking Annalicia up or if TOC should schedule transport.     Expected Discharge Plan: Group Home Barriers to Discharge: Other (must enter comment) (patient needs a specialized care home placement)  Expected Discharge Plan and Services Expected Discharge Plan: Group Home   Discharge Planning Services: CM Consult Post Acute Care Choice: Nursing Home Living arrangements for the past 2 months: Single Family Home Expected Discharge Date: 09/13/20               DME Arranged: N/A DME Agency: NA         HH Agency: NA         Social Determinants of Health (SDOH) Interventions    Readmission Risk Interventions No flowsheet data found.

## 2020-10-31 DIAGNOSIS — R627 Adult failure to thrive: Secondary | ICD-10-CM | POA: Diagnosis not present

## 2020-10-31 MED ORDER — SIMETHICONE 40 MG/0.6ML PO SUSP: 40.0000 mg | Freq: Four times a day (QID) | ORAL | 0 refills | Status: AC

## 2020-10-31 MED ORDER — PANTOPRAZOLE SODIUM 40 MG PO PACK: 40.0000 mg | PACK | Freq: Every day | ORAL | 0 refills | Status: AC

## 2020-10-31 MED ORDER — DOCUSATE SODIUM 50 MG/5ML PO LIQD: 100.0000 mg | Freq: Two times a day (BID) | ORAL | 0 refills | Status: AC

## 2020-10-31 MED ORDER — ENSURE ENLIVE PO LIQD: 1.0000 | Freq: Three times a day (TID) | ORAL | 12 refills | Status: AC

## 2020-10-31 MED ORDER — POLYETHYLENE GLYCOL 3350 17 G PO PACK: 17.0000 g | PACK | Freq: Two times a day (BID) | ORAL | 0 refills | Status: AC

## 2020-10-31 MED ORDER — TIAGABINE HCL 4 MG PO TABS: 4.0000 mg | ORAL_TABLET | Freq: Two times a day (BID) | ORAL | 0 refills | Status: AC

## 2020-10-31 MED ORDER — DULCOLAX 5 MG PO TBEC: 10.0000 mg | DELAYED_RELEASE_TABLET | Freq: Every day | ORAL | 1 refills | Status: AC | PRN

## 2020-10-31 MED ORDER — BACLOFEN 10 MG PO TABS: 10.0000 mg | ORAL_TABLET | Freq: Three times a day (TID) | ORAL | 0 refills | Status: AC

## 2020-10-31 MED ORDER — LORAZEPAM 2 MG/ML PO CONC: 1.0000 mg | Freq: Two times a day (BID) | ORAL | 0 refills | Status: AC

## 2020-10-31 MED ORDER — OXCARBAZEPINE 300 MG/5ML PO SUSP: 600.0000 mg | Freq: Two times a day (BID) | ORAL | 0 refills | Status: AC

## 2020-10-31 MED ORDER — FLUOXETINE HCL 20 MG/5ML PO SOLN: 10.0000 mg | Freq: Every day | ORAL | 0 refills | Status: AC

## 2020-10-31 MED ORDER — RIFAXIMIN 550 MG PO TABS: 550.0000 mg | ORAL_TABLET | Freq: Three times a day (TID) | ORAL | 0 refills | Status: AC

## 2020-10-31 NOTE — TOC Progression Note (Signed)
Transition of Care [facility]) - Progression Note    Patient Details  Name: CHLORA MCBAIN MRN: [id number] Date of Birth: Jan 01, 1976  Transition of Care [facility]) CM/SW Contact  Allayne Butcher, RN Phone Number: 10/31/2020, 4:17 PM  Clinical Narrative:    Medication prescriptions have been sent over to Beckie Salts so the [facility] can have them ready for her when she discharges tomorrow.  Theodore Demark is going to be the caregiver at the patient's new home [address] [place] [address].  Larae's number is [phone]- she does not have a time preference for discharge tomorrow.  RNCM has arranged EMS transport with [facility] for 1115 in the morning.       Expected Discharge Plan: Group Home Barriers to Discharge: Other (must enter comment) (patient needs a specialized care home placement)  Expected Discharge Plan and Services Expected Discharge Plan: Group Home   Discharge Planning Services: CM Consult Post Acute Care Choice: Nursing Home Living arrangements for the past 2 months: Single Family Home Expected Discharge Date: 09/13/20               DME Arranged: N/A DME Agency: NA         HH Agency: NA         Social Determinants of Health (SDOH) Interventions    Readmission Risk Interventions No flowsheet data found.

## 2020-10-31 NOTE — Discharge Summary (Addendum)
Physician Discharge Summary  Alicia Duncan YNW:[id number] DOB: 12-24-1975 DOA: 09/03/2020  PCP: [name], MD  Admit date: 09/03/2020 Discharge date: 11/01/2020  Admitted From: home Discharge disposition: [facility] family living home   Code Status: DNR   Discharge Diagnosis:   Principal Problem:   Failure to thrive in adult Active Problems:   Rett syndrome   Seizure disorder (HCC)   Severe protein-calorie malnutrition (HCC)   Constipation   Vaginal candidiasis   Fecal impaction (HCC)   Depression   [facility])    Chief Complaint  Patient presents with   social work    Brief narrative: Alicia Duncan is a 45 y.o. female with PMH significant for Rett syndrome, cognitive impairment, developmental delay, nonverbal, seizure disorder and debility with complete dependence for ADLs. 5/30, patient was brought to ED by EMS due to unsafe home environment.  Patient's caregiver went on vacation for several days and family at home was not able to adequately take care of the patient.  Reportedly, patient's father has advanced dementia and brother has schizophrenia.  Patient is currently waiting for appropriate placement. Tentative plan to discharge tomorrow 7/28.  Subjective: Patient was seen and examined this morning.  Alert, awake, cognitive impairment at baseline. No fever in last 24 hours.  Hospital course: Sepsis due to aspiration pneumonia- on 7/19 -Patient with known dysphagia.  CT chest with right more than left lower lobe opacities -completed 5 days of antibiotics with IV ceftriaxone.  Sepsis parameters improved.  Recent Labs  Lab 10/28/20 0448  WBC 5.8  Generalized ileus  -GI and general surgery consultation appreciated. -Underwent colonoscopy on 7/23, did not show fecal impaction. -Continue bowel regimen with rifaximin 550 mg 3 times daily for 2 weeks for possible bacterial overgrowth, last date 11/07/2020.  Failure to thrive Rett syndrome with cognitive  disability Developmental delay/nonverbal with oral phase dysphagia at baseline -requires total care -Continue current dysphagia 1 diet with aspiration precautions.   -Continue supportive care.    History of seizure disorder -7/8 seizure-like activity lasted 45 seconds as per RN, no postictal symptoms -Stable. -Continue Ativan, Trileptal, Gabitril.     Depression/anxiety -Stable.   -Continue fluoxetine, Ativan.   Candida vulvovaginitis -Status post nystatin.    Iron deficiency -Given IV Venofer in the hospital.  Oral supplement started.  Vitamin D insufficiency -started on oral supplement.  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 -Patient with severe muscle depletion, severe fat depletion, percent weight loss 11.6% x 6 months. -Nutritional supplementation with Ensure discontinued due to gaseous distention.   -Continue dysphagia 1 diet  -Dietitian following.     Allergies as of 11/01/2020       Reactions   Benadryl [diphenhydramine]    Penicillins Other (See Comments)   Has patient had a PCN reaction causing immediate rash, facial/tongue/throat swelling, SOB or lightheadedness with hypotension: Unknown Has patient had a PCN reaction causing severe rash involving mucus membranes or skin necrosis: Unknown Has patient had a PCN reaction that required hospitalization: Unknown Has patient had a PCN reaction occurring within the last 10 years: Unknown If all of the above answers are "NO", then may proceed with Cephalosporin use.        Medication List     STOP taking these medications    FLUoxetine 10 MG capsule Commonly known as: PROZAC Replaced by: FLUoxetine 20 MG/5ML solution   LORazepam 1 MG tablet Commonly known as: ATIVAN Replaced by: LORazepam 2 MG/ML concentrated solution   omeprazole  20 MG capsule Commonly known as: PRILOSEC   Oxcarbazepine 300 MG tablet Commonly known as: TRILEPTAL Replaced by: OXcarbazepine 300 MG/5ML  suspension       TAKE these medications    baclofen 10 MG tablet Commonly known as: LIORESAL Take 1 tablet (10 mg total) by mouth 3 (three) times daily.   docusate 50 MG/5ML liquid Commonly known as: COLACE Take 10 mLs (100 mg total) by mouth 2 (two) times daily.   Dulcolax 5 MG EC tablet Generic drug: bisacodyl Take 2 tablets (10 mg total) by mouth daily as needed for moderate constipation.   feeding supplement Liqd Take 237 mLs by mouth 3 (three) times daily between meals.   FLUoxetine 20 MG/5ML solution Commonly known as: PROZAC Take 2.5 mLs (10 mg total) by mouth daily. Replaces: FLUoxetine 10 MG capsule   LORazepam 2 MG/ML concentrated solution Commonly known as: ATIVAN Take 0.5 mLs (1 mg total) by mouth 2 (two) times daily for 7 days. Replaces: LORazepam 1 MG tablet   nystatin cream Commonly known as: MYCOSTATIN Apply topically 2 (two) times daily.   OXcarbazepine 300 MG/5ML suspension Commonly known as: TRILEPTAL Take 10 mLs (600 mg total) by mouth 2 (two) times daily. Replaces: Oxcarbazepine 300 MG tablet   pantoprazole sodium 40 mg/20 mL Pack Commonly known as: PROTONIX Take 20 mLs (40 mg total) by mouth daily.   polyethylene glycol 17 g packet Commonly known as: MIRALAX / GLYCOLAX Take 17 g by mouth 2 (two) times daily.   rifaximin 550 MG Tabs tablet Commonly known as: XIFAXAN Take 1 tablet (550 mg total) by mouth 3 (three) times daily for 7 days.   simethicone 40 MG/0.6ML drops Commonly known as: MYLICON Take 0.6 mLs (40 mg total) by mouth 4 (four) times daily.   tiaGABine 4 MG tablet Commonly known as: GABITRIL Take 1 tablet (4 mg total) by mouth 2 (two) times daily.               Discharge Care Instructions  (From admission, onward)           Start     Ordered   11/01/20 0000  No dressing needed        11/01/20 6644            Discharge Instructions:  Diet Recommendation:  Discharge Diet Orders (From admission,  onward)     Start     Ordered   09/11/20 0000  Diet - low sodium heart healthy        09/11/20 1040            Follow with Primary MD [name], MD in 7 days   Get CBC/BMP checked in next visit within 1 week by PCP or SNF MD ( we routinely change or add medications that can affect your baseline labs and fluid status, therefore we recommend that you get the mentioned basic workup next visit with your PCP, your PCP may decide not to get them or add new tests based on their clinical decision)  On your next visit with your PCP, please Get Medicines reviewed and adjusted.  Please request your PCP  to go over all Hospital Tests and Procedure/Radiological results at the follow up, please get all Hospital records sent to your Prim MD by signing hospital release before you go home.  Activity: As tolerated with Full fall precautions use walker/cane & assistance as needed  For Heart failure patients - Check your Weight same time everyday, if you gain  over 2 pounds, or you develop in leg swelling, experience more shortness of breath or chest pain, call your Primary MD immediately. Follow Cardiac Low Salt Diet and 1.5 lit/day fluid restriction.  If you have smoked or chewed Tobacco in the last 2 yrs please stop smoking, stop any regular Alcohol  and or any Recreational drug use.  If you experience worsening of your admission symptoms, develop shortness of breath, life threatening emergency, suicidal or homicidal thoughts you must seek medical attention immediately by calling 911 or calling your MD immediately  if symptoms less severe.  You Must read complete instructions/literature along with all the possible adverse reactions/side effects for all the Medicines you take and that have been prescribed to you. Take any new Medicines after you have completely understood and accpet all the possible adverse reactions/side effects.   Do not drive, operate heavy machinery, perform activities at  heights, swimming or participation in water activities or provide baby sitting services if your were admitted for syncope or siezures until you have seen by Primary MD or a Neurologist and advised to do so again.  Do not drive when taking Pain medications.  Do not take more than prescribed Pain, Sleep and Anxiety Medications  Wear Seat belts while driving.   Please note You were cared for by a hospitalist during your hospital stay. If you have any questions about your discharge medications or the care you received while you were in the hospital after you are discharged, you can call the unit and asked to speak with the hospitalist on call if the hospitalist that took care of you is not available. Once you are discharged, your primary care physician will handle any further medical issues. Please note that NO REFILLS for any discharge medications will be authorized once you are discharged, as it is imperative that you return to your primary care physician (or establish a relationship with a primary care physician if you do not have one) for your aftercare needs so that they can reassess your need for medications and monitor your lab values.    Follow ups:    Contact information for after-discharge care     Destination     [facility] Preferred SNF .   Service: Skilled Nursing Contact information: [address] [place] [address] [phone]                     Wound care:   Wound / Incision (Open or Dehisced) 10/10/20 Other (Comment) Ankle Anterior;Right (Active)  Date First Assessed/Time First Assessed: 10/10/20 2000   Wound Type: (c) Other (Comment)  Location: Ankle  Location Orientation: Anterior;Right  Present on Admission: No    Assessments 10/10/2020  8:00 PM 10/31/2020  8:45 PM  Dressing Type (S) Petroleum;Foam - Lift dressing to assess site every shift Foam - Lift dressing to assess site every shift  Dressing Changed (S) New --   Dressing Status Clean;Dry;Intact Dry;Intact;Clean  Dressing Change Frequency PRN --  Site / Wound Assessment Red --  Peri-wound Assessment Intact --  Margins Attached edges (approximated) --  Closure None --  Drainage Amount None None  Non-staged Wound Description Partial thickness --  Treatment Cleansed;Other (Comment) --     No Linked orders to display    Discharge Exam:   Vitals:   10/31/20 1122 10/31/20 2152 11/01/20 0335 11/01/20 0755  BP: 116/73 107/65 103/66 (!) 106/58  Pulse: 83 92 83 84  Resp: 18 16 16  17  Temp: (!) 97.5 F (36.4 C) 97.9 F (36.6 C) (!) 97.5 F (36.4 C) 98.1 F (36.7 C)  TempSrc:      SpO2: 97% 95% 96% 98%  Weight:      Height:        Body mass index is 18.61 kg/m.  General exam: Middle-aged Caucasian female. Not in distress. No new symptoms. Skin: No rashes, lesions or ulcers. HEENT: Atraumatic, normocephalic, no obvious bleeding Lungs: Clear to auscultation bilaterally CVS: Regular rate and rhythm, no murmur GI/Abd soft, diffusely distended, bowel sound present, nontender CNS: Alert, awake, unable to follow commands because of cognitive impairment Psychiatry: Baseline chronic psychiatric issues Extremities: No pedal edema, no calf tenderness  Time coordinating discharge: 35 minutes   The results of significant diagnostics from this hospitalization (including imaging, microbiology, ancillary and laboratory) are listed below for reference.    Procedures and Diagnostic Studies:   No results found.   Labs:   Basic Metabolic Panel: Recent Labs  Lab 10/26/20 0550 10/27/20 0352 10/28/20 0448 10/29/20 0518  NA 137 139 139 142  K 3.5 3.8 3.6 4.1  CL 106 108 107 108  CO2 24 26 26 27   GLUCOSE 88 83 86 80  BUN 13 11 12  25*  CREATININE 0.35* 0.41* 0.46 0.41*  CALCIUM 8.3* 8.6* 8.6* 8.7*  MG 1.8 2.1 1.9 1.9  PHOS 3.5 3.2 4.1 4.6   GFR Estimated Creatinine Clearance: 58 mL/min (A) (by C-G formula based on SCr of 0.41 mg/dL  (L)). Liver Function Tests: No results for input(s): AST, ALT, ALKPHOS, BILITOT, PROT, ALBUMIN in the last 168 hours. No results for input(s): LIPASE, AMYLASE in the last 168 hours. No results for input(s): AMMONIA in the last 168 hours. Coagulation profile No results for input(s): INR, PROTIME in the last 168 hours.  CBC: Recent Labs  Lab 10/28/20 0448  WBC 5.8  HGB 11.9*  HCT 36.9  MCV 95.1  PLT 131*   Cardiac Enzymes: No results for input(s): CKTOTAL, CKMB, CKMBINDEX, TROPONINI in the last 168 hours. BNP: Invalid input(s): POCBNP CBG: No results for input(s): GLUCAP in the last 168 hours. D-Dimer No results for input(s): DDIMER in the last 72 hours. Hgb A1c No results for input(s): HGBA1C in the last 72 hours. Lipid Profile No results for input(s): CHOL, HDL, LDLCALC, TRIG, CHOLHDL, LDLDIRECT in the last 72 hours. Thyroid function studies No results for input(s): TSH, T4TOTAL, T3FREE, THYROIDAB in the last 72 hours.  Invalid input(s): FREET3 Anemia work up No results for input(s): VITAMINB12, FOLATE, FERRITIN, TIBC, IRON, RETICCTPCT in the last 72 hours. Microbiology Recent Results (from the past 240 hour(s))  Culture, blood (Routine X 2) w Reflex to ID Panel     Status: None   Collection Time: 10/23/20  9:18 AM   Specimen: BLOOD  Result Value Ref Range Status   Specimen Description BLOOD RIGHT [facility]  Final   Special Requests   Final    BOTTLES DRAWN AEROBIC AND ANAEROBIC Blood Culture adequate volume   Culture   Final    NO GROWTH 6 DAYS Performed at [facility], [address] [place], [address] [place]    Report Status 10/29/2020 FINAL  Final  Culture, blood (Routine X 2) w Reflex to ID Panel     Status: None   Collection Time: 10/23/20  9:18 AM   Specimen: BLOOD  Result Value Ref Range Status   Specimen Description BLOOD RIGHT FA  Final   Special Requests   Final  BOTTLES DRAWN AEROBIC AND ANAEROBIC Blood Culture adequate volume   Culture    Final    NO GROWTH 6 DAYS Performed at [facility], [address] [place], [place] [id number]    Report Status 10/29/2020 FINAL  Final     Signed: Melina SchoolsBinaya Shawniece Oyola  [facility] 11/01/2020, 8:19 AM

## 2020-10-31 NOTE — Progress Notes (Signed)
PROGRESS NOTE  Alicia Duncan  DOB: 08-06-1975  PCP: [name], MD WLS:[id number]  DOA: 09/03/2020  LOS: 56 days  Hospital Day: 60   Chief Complaint  Patient presents with   social work    Brief narrative: Alicia Duncan is a 45 y.o. female with PMH significant for Rett syndrome, cognitive impairment, developmental delay, nonverbal, seizure disorder and debility with complete dependence for ADLs. 5/30, patient was brought to ED by EMS due to unsafe home environment.  Patient's caregiver went on vacation for several days and family at home was not able to adequately take care of the patient.  Reportedly, patient's father has advanced dementia and brother has schizophrenia.  Patient is currently waiting for appropriate placement. Tentative plan to discharge tomorrow 7/28.  Subjective: Patient was seen and examined this morning.  Alert, awake, cognitive impairment at baseline. No fever in last 24 hours.  Assessment/Plan: Sepsis due to aspiration pneumonia- on 7/19 -Patient with known dysphagia.  CT chest with right more than left lower lobe opacities -completed 5 days of antibiotics with IV ceftriaxone.  Sepsis parameters improved.  Patient has low-grade temperature again.  Continue to monitor. Recent Labs  Lab 10/25/20 0411 10/28/20 0448  WBC 4.9 5.8   Generalized ileus  -GI and general surgery consultation appreciated. -Underwent colonoscopy on 7/23, did not show fecal impaction. -Continue bowel regimen with rifaximin 550 mg 3 times daily for 2 weeks for possible bacterial overgrowth, last date 11/07/2020.  Failure to thrive Rett syndrome with cognitive disability Developmental delay/nonverbal with oral phase dysphagia at baseline -requires total care -Continue current dysphagia 1 diet with aspiration precautions.   -Continue supportive care.   -Needs a safe discharge plan which TOC is working on.  Tentative plan to discharge on 7/28.  History of seizure  disorder -7/8 seizure-like activity lasted 45 seconds as per RN, no postictal symptoms -Stable. -Continue Ativan, Trileptal, Gabitril.     Depression/anxiety -Stable.   -Continue fluoxetine, Ativan.   Candida vulvovaginitis -Status post nystatin.    Iron deficiency -Given IV Venofer in the hospital.  Oral supplement started.  Vitamin D insufficiency -started on oral supplement.  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 -Patient with severe muscle depletion, severe fat depletion, percent weight loss 11.6% x 6 months. -Nutritional supplementation with Ensure discontinued due to gaseous distention.   -Continue dysphagia 1 diet  -Dietitian following.     Mobility: Encourage ambulation if able to follow commands Code Status:   Code Status: DNR  Nutritional status: Body mass index is 18.61 kg/m. Nutrition Problem: Severe Malnutrition Etiology: chronic illness (Rett syndrome) Signs/Symptoms: severe muscle depletion, severe fat depletion, percent weight loss (11.6% x 6 months) Percent weight loss: 11.6 % (x 6 months) Diet:  Diet Order             DIET - DYS 1 Room service appropriate? Yes; Fluid consistency: Thin  Diet effective now           Diet - low sodium heart healthy                  DVT prophylaxis:  enoxaparin (LOVENOX) injection 30 mg Start: 09/03/20 2200   Antimicrobials: Completed IV Rocephin Fluid: Not on IV fluid Consultants: General surgery and GI signed off. Family Communication: None at bedside  Status is: Inpatient  Remains inpatient appropriate because: Pending placement.  Dispo: The patient is from: Home              Anticipated  d/c is to: Facility discharge.  Tentative plan to discharge on 7/28 per case management              Patient currently is medically stable to d/c.   Difficult to place patient Yes     Infusions:   promethazine (PHENERGAN) injection (IM or IVPB) Stopped (10/22/20 1608)     Scheduled Meds:  baclofen  10 mg Oral TID   docusate  100 mg Oral BID   enoxaparin (LOVENOX) injection  30 mg Subcutaneous Q24H   feeding supplement  1 Bottle Oral TID BM   FLUoxetine  10 mg Oral Daily   LORazepam  1 mg Oral BID   OXcarbazepine  600 mg Oral BID   pantoprazole sodium  40 mg Oral Daily   polyethylene glycol  17 g Oral BID   rifaximin  550 mg Oral TID   simethicone  40 mg Oral QID   tiaGABine  4 mg Oral BID   Vitamin D (Ergocalciferol)  50,000 Units Oral Q7 days    Antimicrobials: Anti-infectives (From admission, onward)    Start     Dose/Rate Route Frequency Ordered Stop   10/25/20 1200  cefTRIAXone (ROCEPHIN) 1 g in sodium chloride 0.9 % 100 mL IVPB        1 g 200 mL/hr over 30 Minutes Intravenous Every 24 hours 10/24/20 1404 10/27/20 1040   10/24/20 1600  rifaximin (XIFAXAN) tablet 550 mg        550 mg Oral 3 times daily 10/24/20 1407 11/07/20 1559   10/23/20 1130  cefTRIAXone (ROCEPHIN) 1 g in sodium chloride 0.9 % 100 mL IVPB  Status:  Discontinued        1 g 200 mL/hr over 30 Minutes Intravenous Every 24 hours 10/23/20 1030 10/24/20 1404       PRN meds: acetaminophen **OR** acetaminophen, bisacodyl, ondansetron **OR** ondansetron (ZOFRAN) IV, promethazine (PHENERGAN) injection (IM or IVPB)   Objective: Vitals:   10/30/20 2115 10/31/20 0713  BP: 108/62 (!) 97/56  Pulse: (!) 101 81  Resp: 16 14  Temp: 98.7 F (37.1 C) 97.7 F (36.5 C)  SpO2: 96% 98%    Intake/Output Summary (Last 24 hours) at 10/31/2020 1016 Last data filed at 10/30/2020 1028 Gross per 24 hour  Intake 240 ml  Output --  Net 240 ml    Filed Weights   10/09/20 0938 10/25/20 1203 10/27/20 1211  Weight: 39.3 kg 41.8 kg 41.8 kg   Weight change:  Body mass index is 18.61 kg/m.   Physical Exam: General exam: Middle-aged Caucasian female. Not in distress. No new symptoms. Skin: No rashes, lesions or ulcers. HEENT: Atraumatic, normocephalic, no obvious bleeding Lungs:  Clear to auscultation bilaterally CVS: Regular rate and rhythm, no murmur GI/Abd soft, diffusely distended, bowel sound present, nontender CNS: Alert, awake, unable to follow commands because of cognitive impairment Psychiatry: Baseline chronic psychiatric issues Extremities: No pedal edema, no calf tenderness  Data Review: I have personally reviewed the laboratory data and studies available.  Recent Labs  Lab 10/25/20 0411 10/28/20 0448  WBC 4.9 5.8  HGB 12.2 11.9*  HCT 39.7 36.9  MCV 97.1 95.1  PLT 102* 131*     Recent Labs  Lab 10/25/20 0411 10/26/20 0550 10/27/20 0352 10/28/20 0448 10/29/20 0518  NA 140 137 139 139 142  K 4.2 3.5 3.8 3.6 4.1  CL 107 106 108 107 108  CO2 24 24 26 26 27   GLUCOSE 76 88 83 86 80  BUN 12  13 11 12  25*  CREATININE 0.36* 0.35* 0.41* 0.46 0.41*  CALCIUM 8.2* 8.3* 8.6* 8.6* 8.7*  MG 2.5* 1.8 2.1 1.9 1.9  PHOS 2.6 3.5 3.2 4.1 4.6      F/u labs none Unresulted Labs (From admission, onward)    None       Signed, , MD [facility] 10/31/2020

## 2020-11-01 DIAGNOSIS — R627 Adult failure to thrive: Secondary | ICD-10-CM | POA: Diagnosis not present

## 2020-11-01 NOTE — TOC Transition Note (Signed)
Transition of Care [facility]) - CM/SW Discharge Note   Patient Details  Name: Alicia Duncan MRN: [id number] Date of Birth: June 21, 1975  [facility] CM/SW Contact:  Allayne Butcher, RN Phone Number: 11/01/2020, 9:32 AM   Clinical Narrative:    Patient has a bed with [facility] in [place].  Patient is medically cleared for discharge today.  [facility] will pick patient up at 1115.     Final next level of care: Group Home ([facility]) Barriers to Discharge: Barriers Resolved   Patient Goals and CMS Choice Patient states their goals for this hospitalization and ongoing recovery are:: patient unable to voice goals- brother would like for patient to be placed in long term care CMS Medicare.gov Compare Post Acute Care list provided to:: Legal Guardian Choice offered to / list presented to : [facility] POA / Guardian, Sibling  Discharge Placement              Patient chooses bed at:  ([facility] in [place]) Patient to be transferred to facility by: [facility] Name of family member notified: Genelle Bal Patient and family notified of of transfer: 11/01/20  Discharge Plan and Services   Discharge Planning Services: CM Consult Post Acute Care Choice: Nursing Home          DME Arranged: N/A DME Agency: NA         HH Agency: NA        Social Determinants of Health (SDOH) Interventions     Readmission Risk Interventions No flowsheet data found.

## 2020-11-01 NOTE — Care Management Important Message (Signed)
Important Message  Patient Details  Name: Alicia Duncan MRN: [id number] Date of Birth: February 25, 1976   Medicare Important Message Given:  Yes  I talked with the patient's legal guardian, Johnie Stadel, brother [phone]) and he was happy with the discharge plan for today. I asked if he would like a copy of the form and he replied no. I thanked him for his time.    [name] A Paulena Servais 11/01/2020, 8:59 AM

## 2020-11-14 ENCOUNTER — Encounter: Payer: Self-pay | Admitting: Gastroenterology

## 2021-09-20 IMAGING — CT CT HEAD W/O CM
4 of 8 series · 16 of 47 positions shown, 17 images · non-contrast
Comparison: None.

CLINICAL DATA: Nontraumatic seizure

EXAM:
CT HEAD WITHOUT CONTRAST
TECHNIQUE: Contiguous axial images were obtained from the base of the skull
through the vertex without intravenous contrast.

[Series 2: head wo · axial · 0.39mm/px · z∈[-96,-21]mm · 4 of 26 slices shown, 5 images]
[im 6/26  brain]
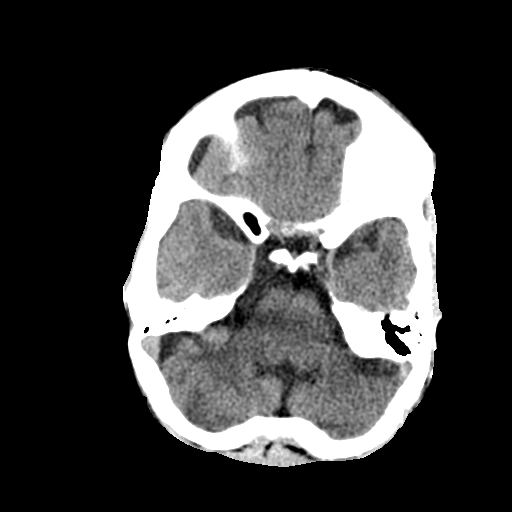
[im 6/26  bone]
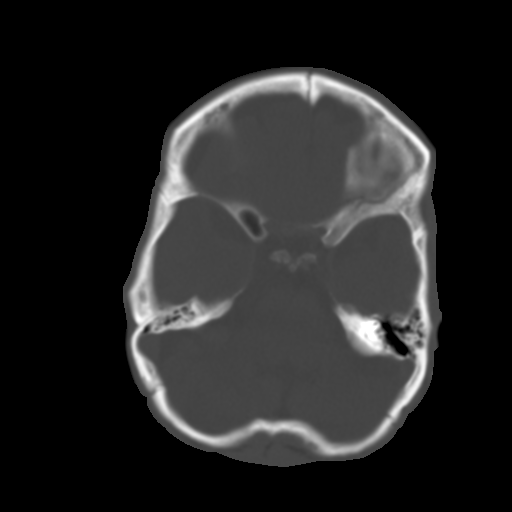
[im 11/26  brain]
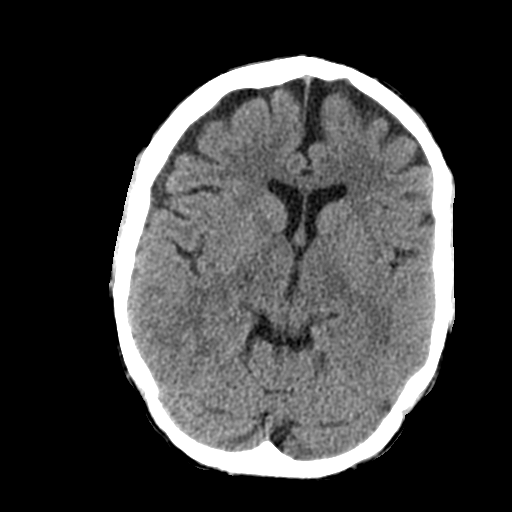
[im 16/26  brain]
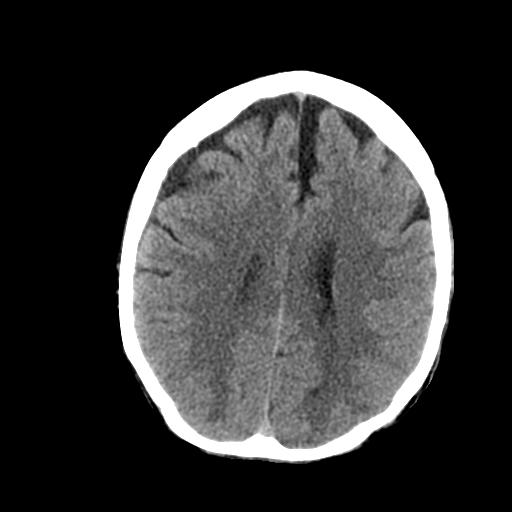
[im 21/26  brain]
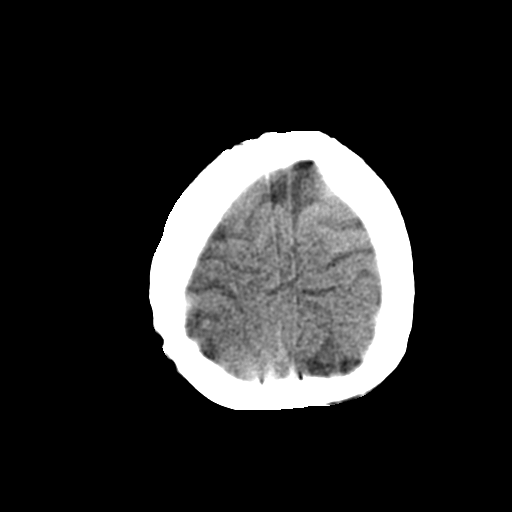

[Series 3: head bone · axial · 0.39mm/px · z∈[-113,-23]mm · 7 of 65 slices shown]
[im 5/65  bone]
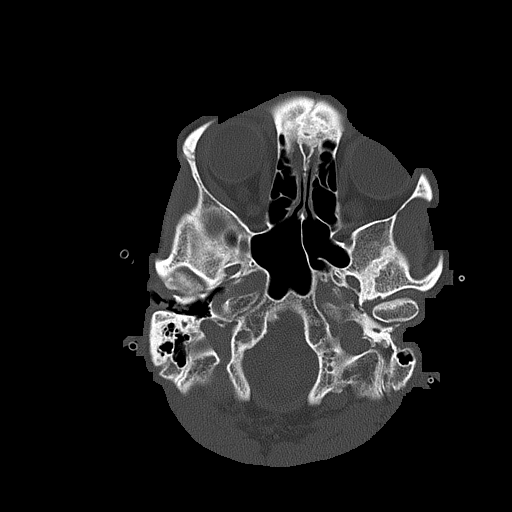
[im 15/65  bone]
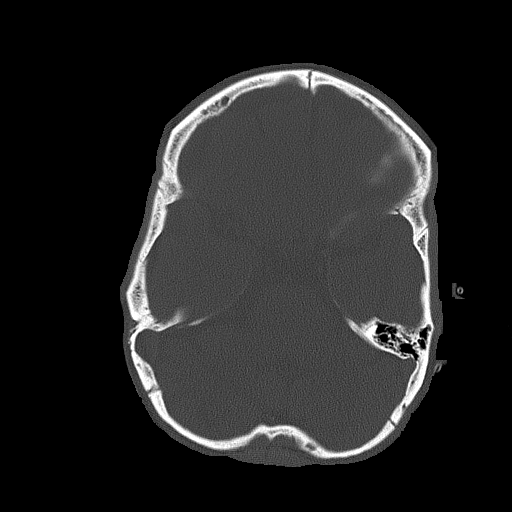
[im 20/65  bone]
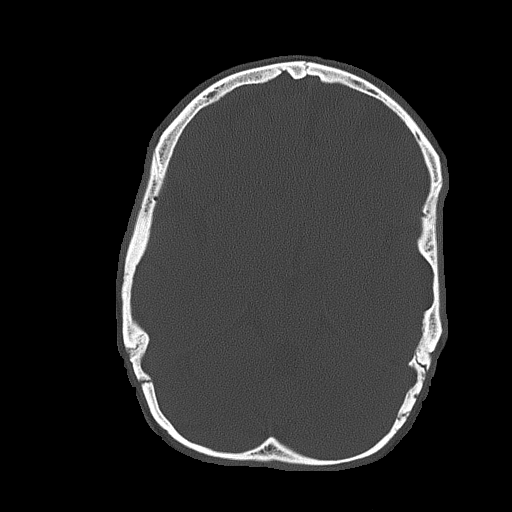
[im 30/65  bone]
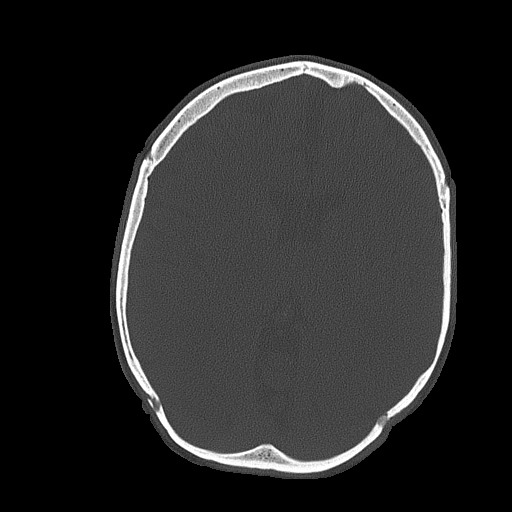
[im 35/65  bone]
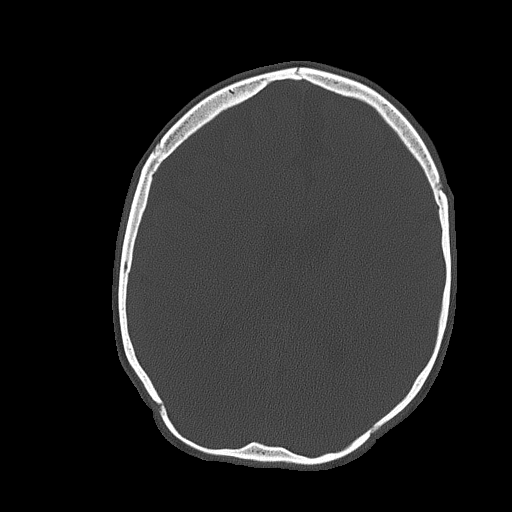
[im 45/65  bone]
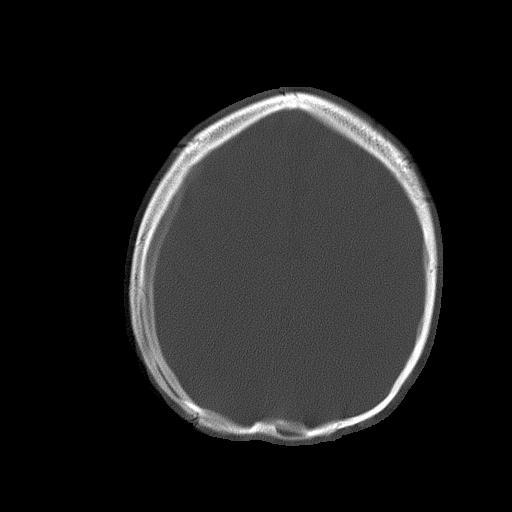
[im 50/65  bone]
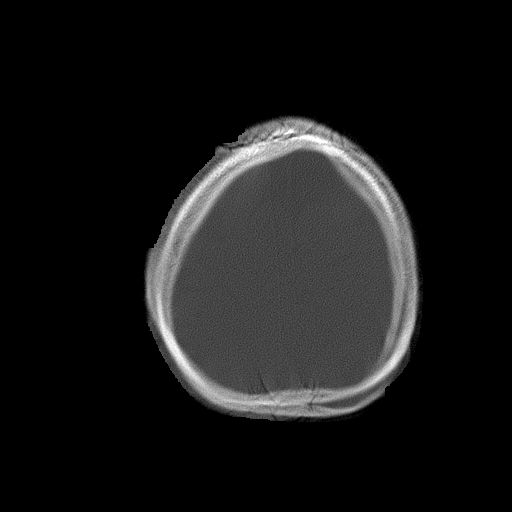

[Series 6: coronal soft tissue · coronal · 0.28mm/px · 3 of 56 slices shown]
[im 14/56  brain]
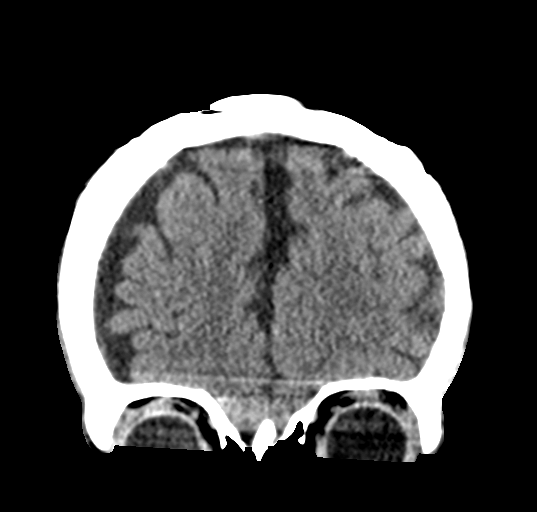
[im 28/56  brain]
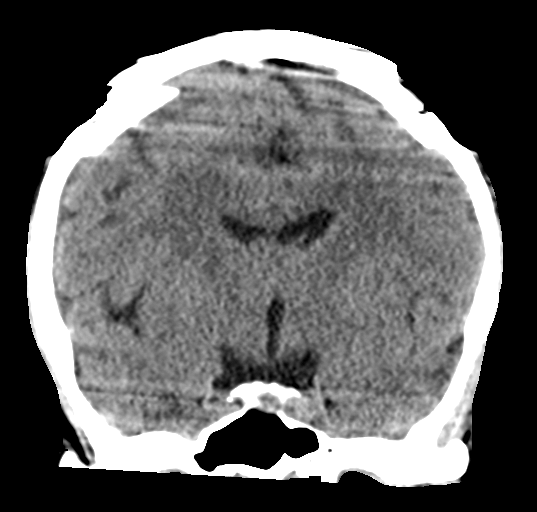
[im 42/56  brain]
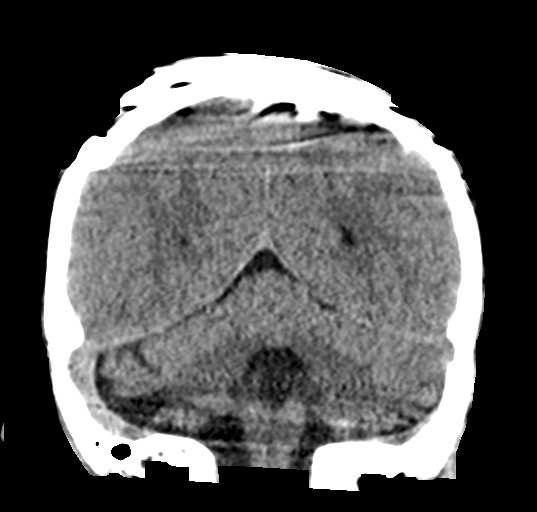

[Series 9: sagittal soft tissue · sagittal · 0.15mm/px · 2 of 51 slices shown]
[im 17/51  brain]
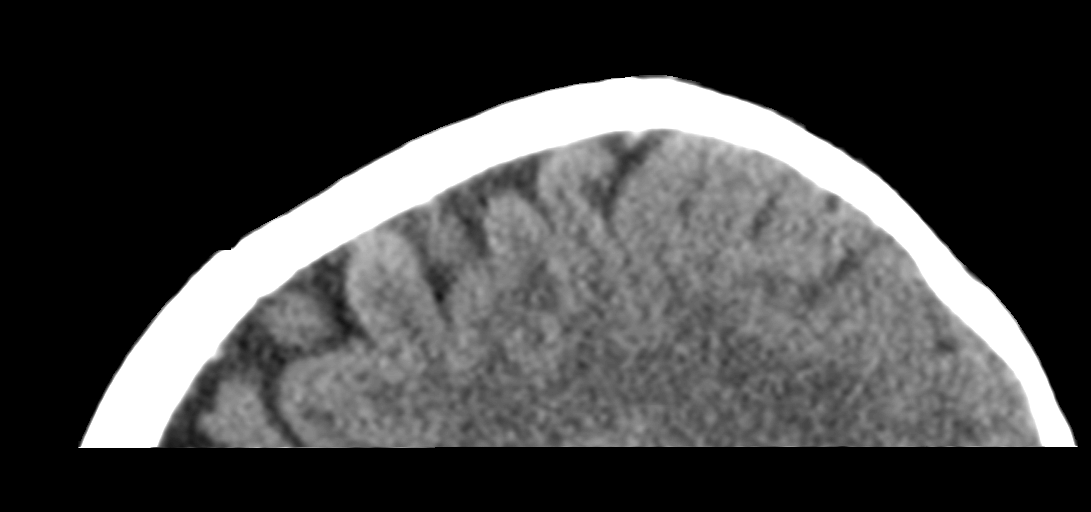
[im 34/51  brain]
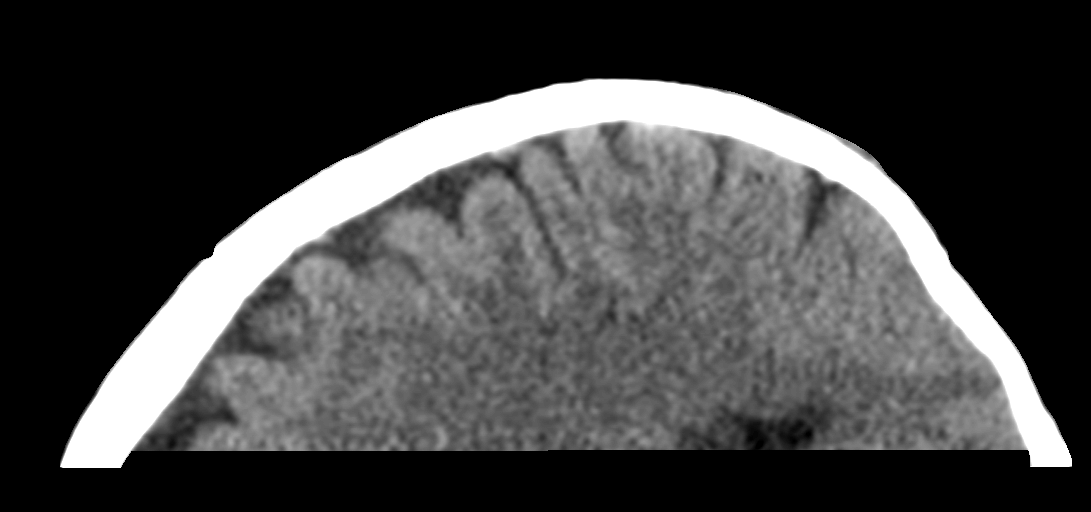

[16 of 47 positions shown; findings below may reference images not displayed]

FINDINGS: Brain: No evidence of acute infarction, hemorrhage, hydrocephalus,
extra-axial collection or mass lesion/mass effect.

Vascular: Negative for hyperdense vessel

Skull: Negative

Sinuses/Orbits: Mucosal edema right maxillary sinus otherwise clear
sinuses. Negative orbit.

Other: None
IMPRESSION: Negative CT head.

## 2022-03-07 IMAGING — DX DG ABDOMEN 1V
1 series · 1 of 1 positions shown · non-contrast
Comparison: 02/16/2020

CLINICAL DATA: Nausea and vomiting.

EXAM:
ABDOMEN - 1 VIEW

[abdomen supine]
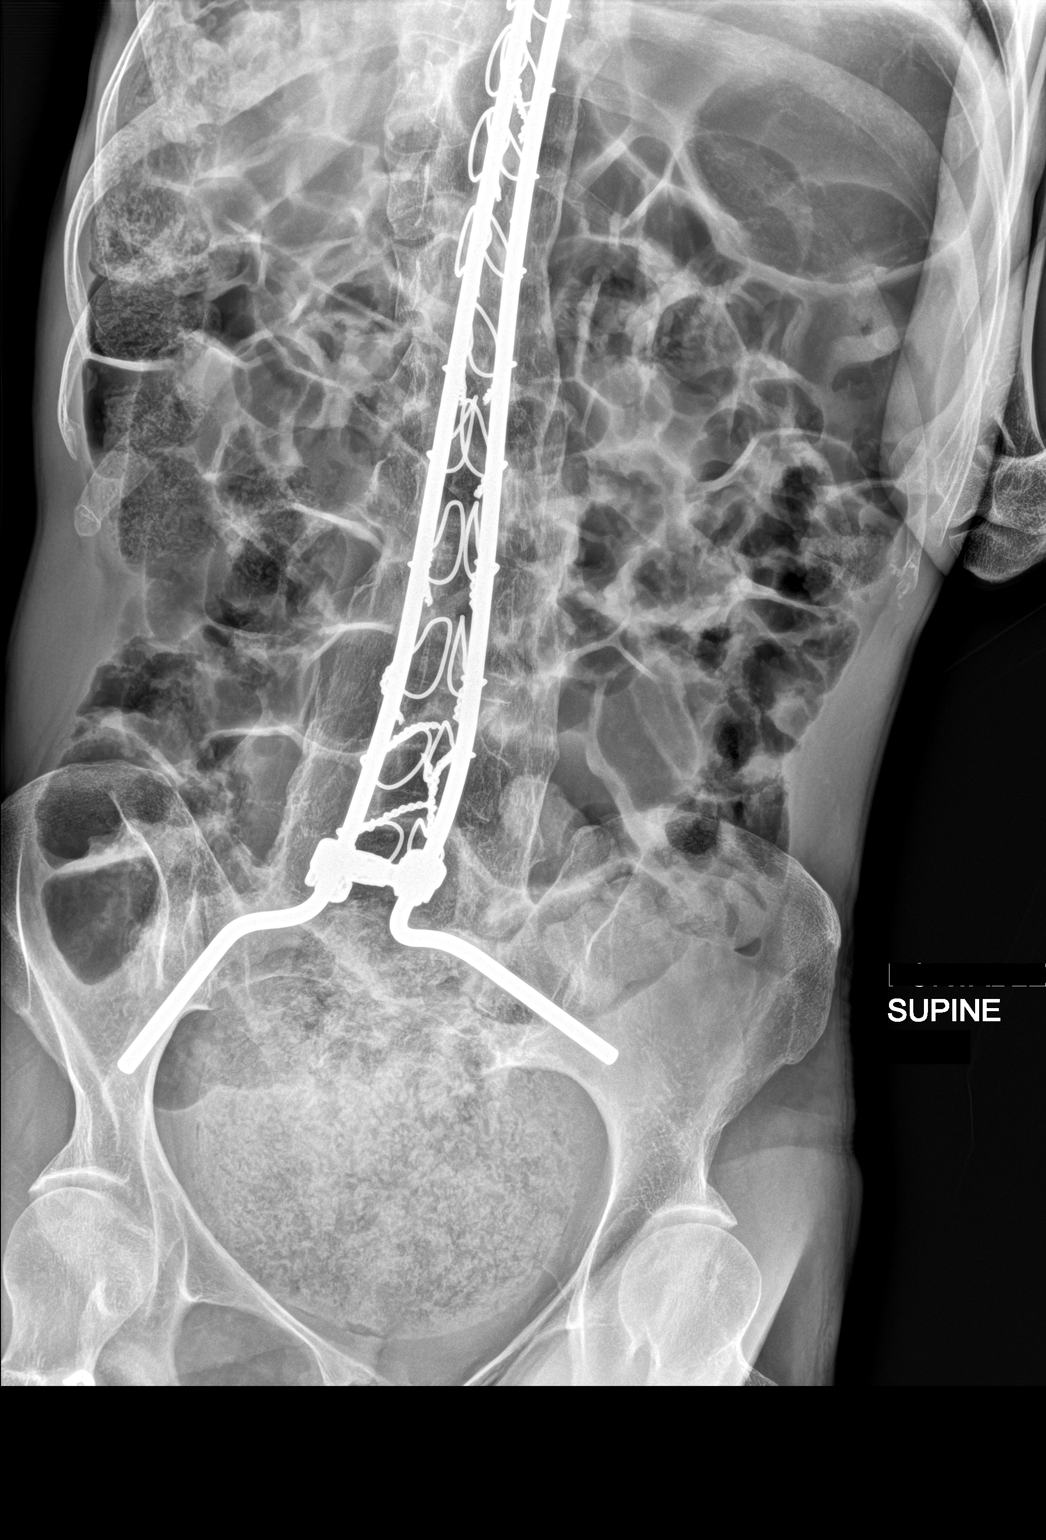

[1 of 1 positions shown; findings below may reference images not displayed]

FINDINGS: Extensive gaseous distension of the small and large bowel loops are
again noted throughout the abdomen. Large desiccated stool ball
identified within the rectum measuring 10.2 x 11.2 cm concerning for
fecal impaction. Scoliosis rods identified.
IMPRESSION: 1. Persistent extensive gaseous distension of the small and large
bowel loops compatible with ileus.
2. Large desiccated stool ball within the rectum concerning for
fecal impaction.

## 2022-04-07 IMAGING — DX DG ABDOMEN 1V
2 series · 2 of 2 positions shown · non-contrast
Comparison: 10/23/2020

CLINICAL DATA: Follow-up ileus

EXAM:
ABDOMEN - 1 VIEW

[abdomen supine (1 of 2)]
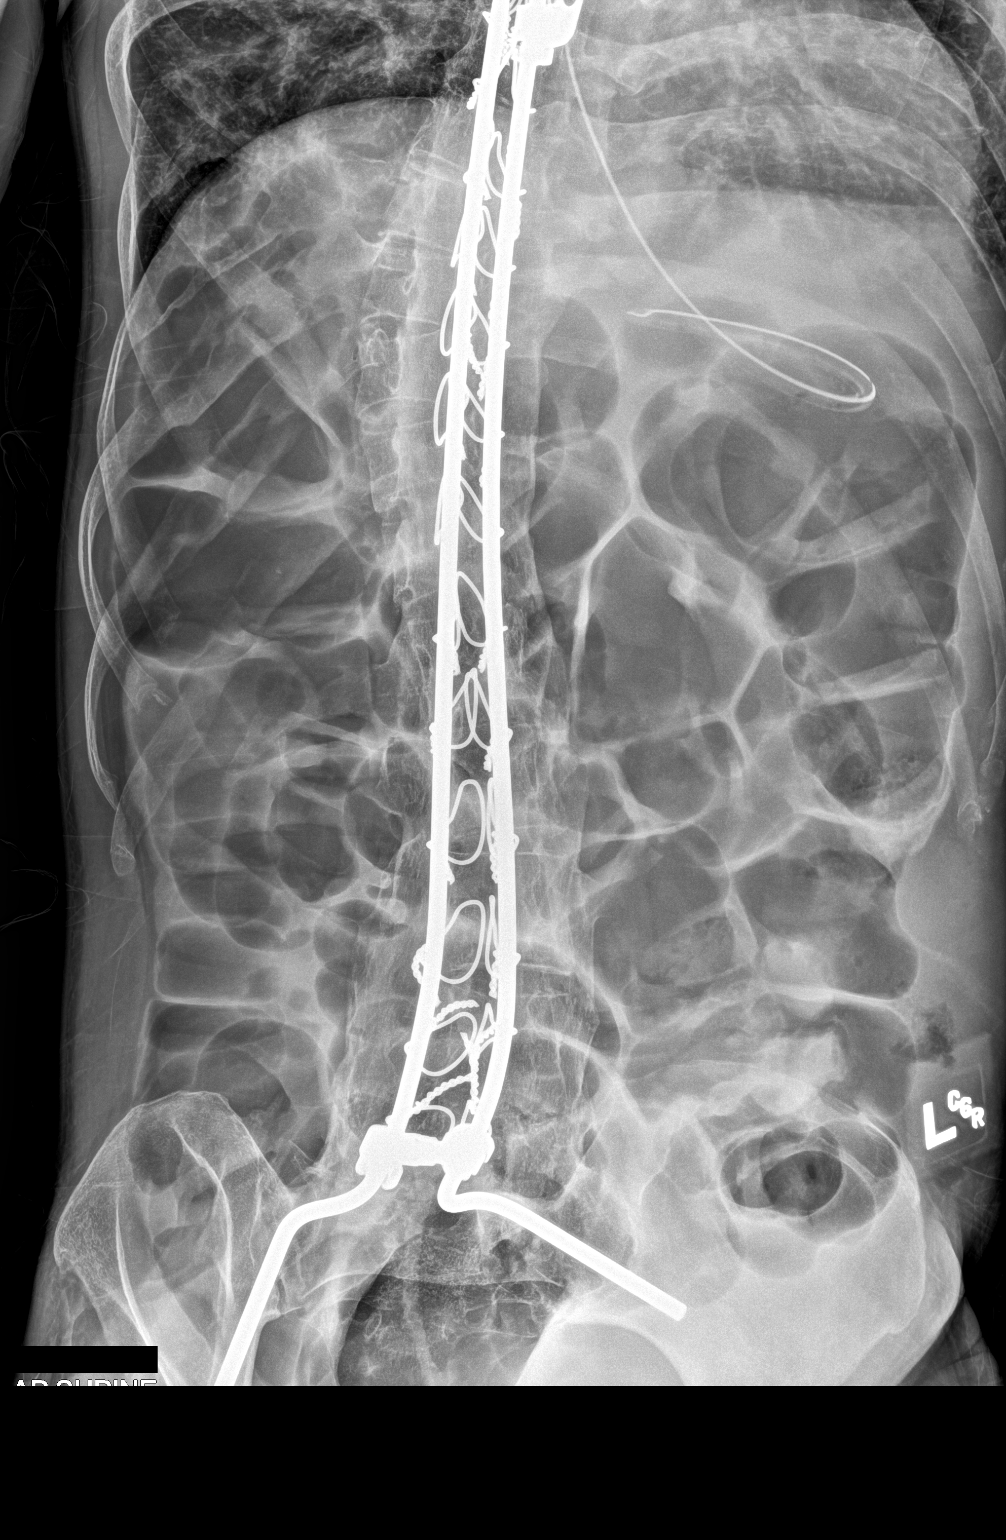

[abdomen supine (2 of 2)]
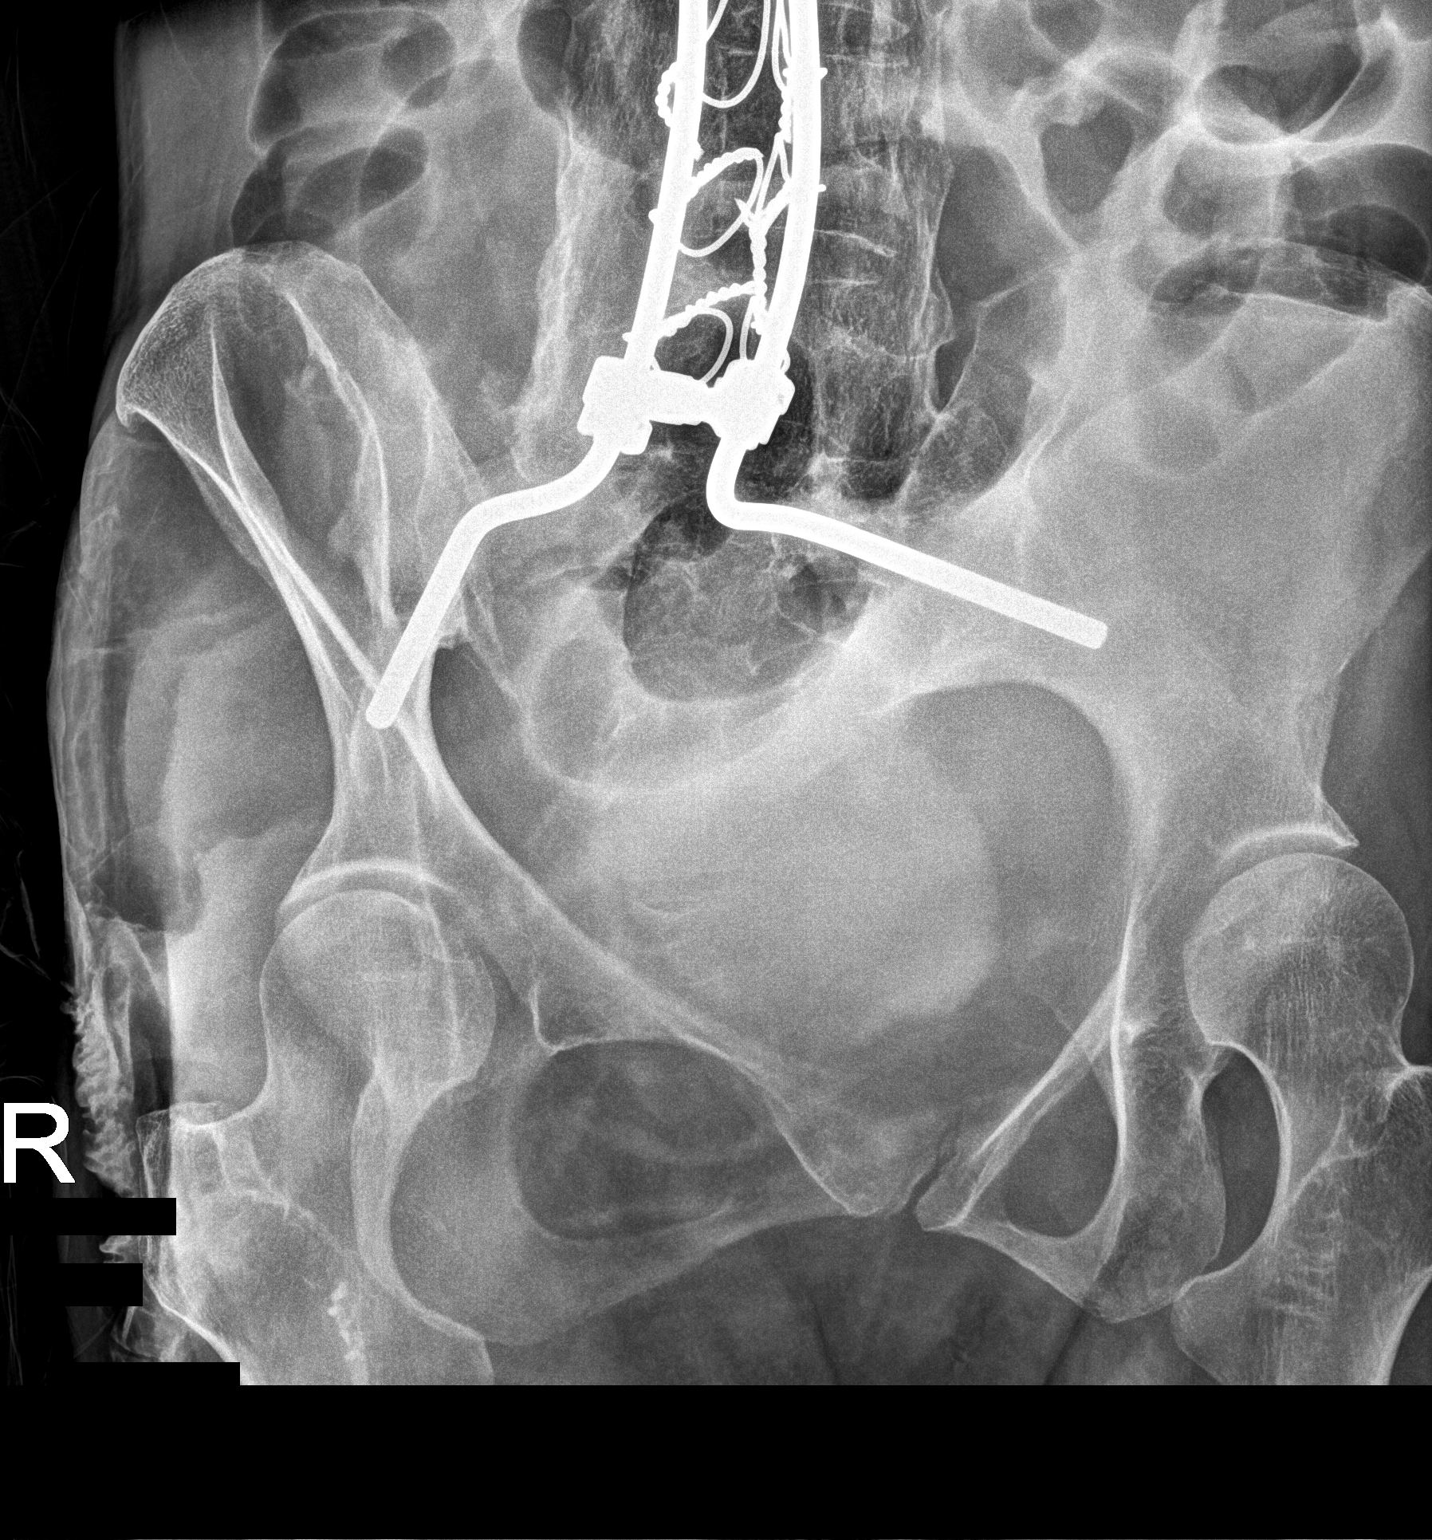

[2 of 2 positions shown; findings below may reference images not displayed]

FINDINGS: Scattered large and small bowel gas is noted similar to that seen on
the prior exam consistent with a generalized ileus. Gastric catheter
is noted in place. Postsurgical changes in the thoracolumbar spine
are again seen and stable. No free air is noted. No new acute
abnormality is noted.
IMPRESSION: Findings consistent with a generalized ileus. This is similar to
that seen on the prior exam.

## 2022-04-08 IMAGING — DX DG CHEST 1V PORT
1 series · 1 of 1 positions shown · non-contrast
Comparison: Chest radiograph dated 10/23/2020

CLINICAL DATA: 45-year-old female with NG placement.

EXAM:
PORTABLE CHEST 1 VIEW

[chest ap]
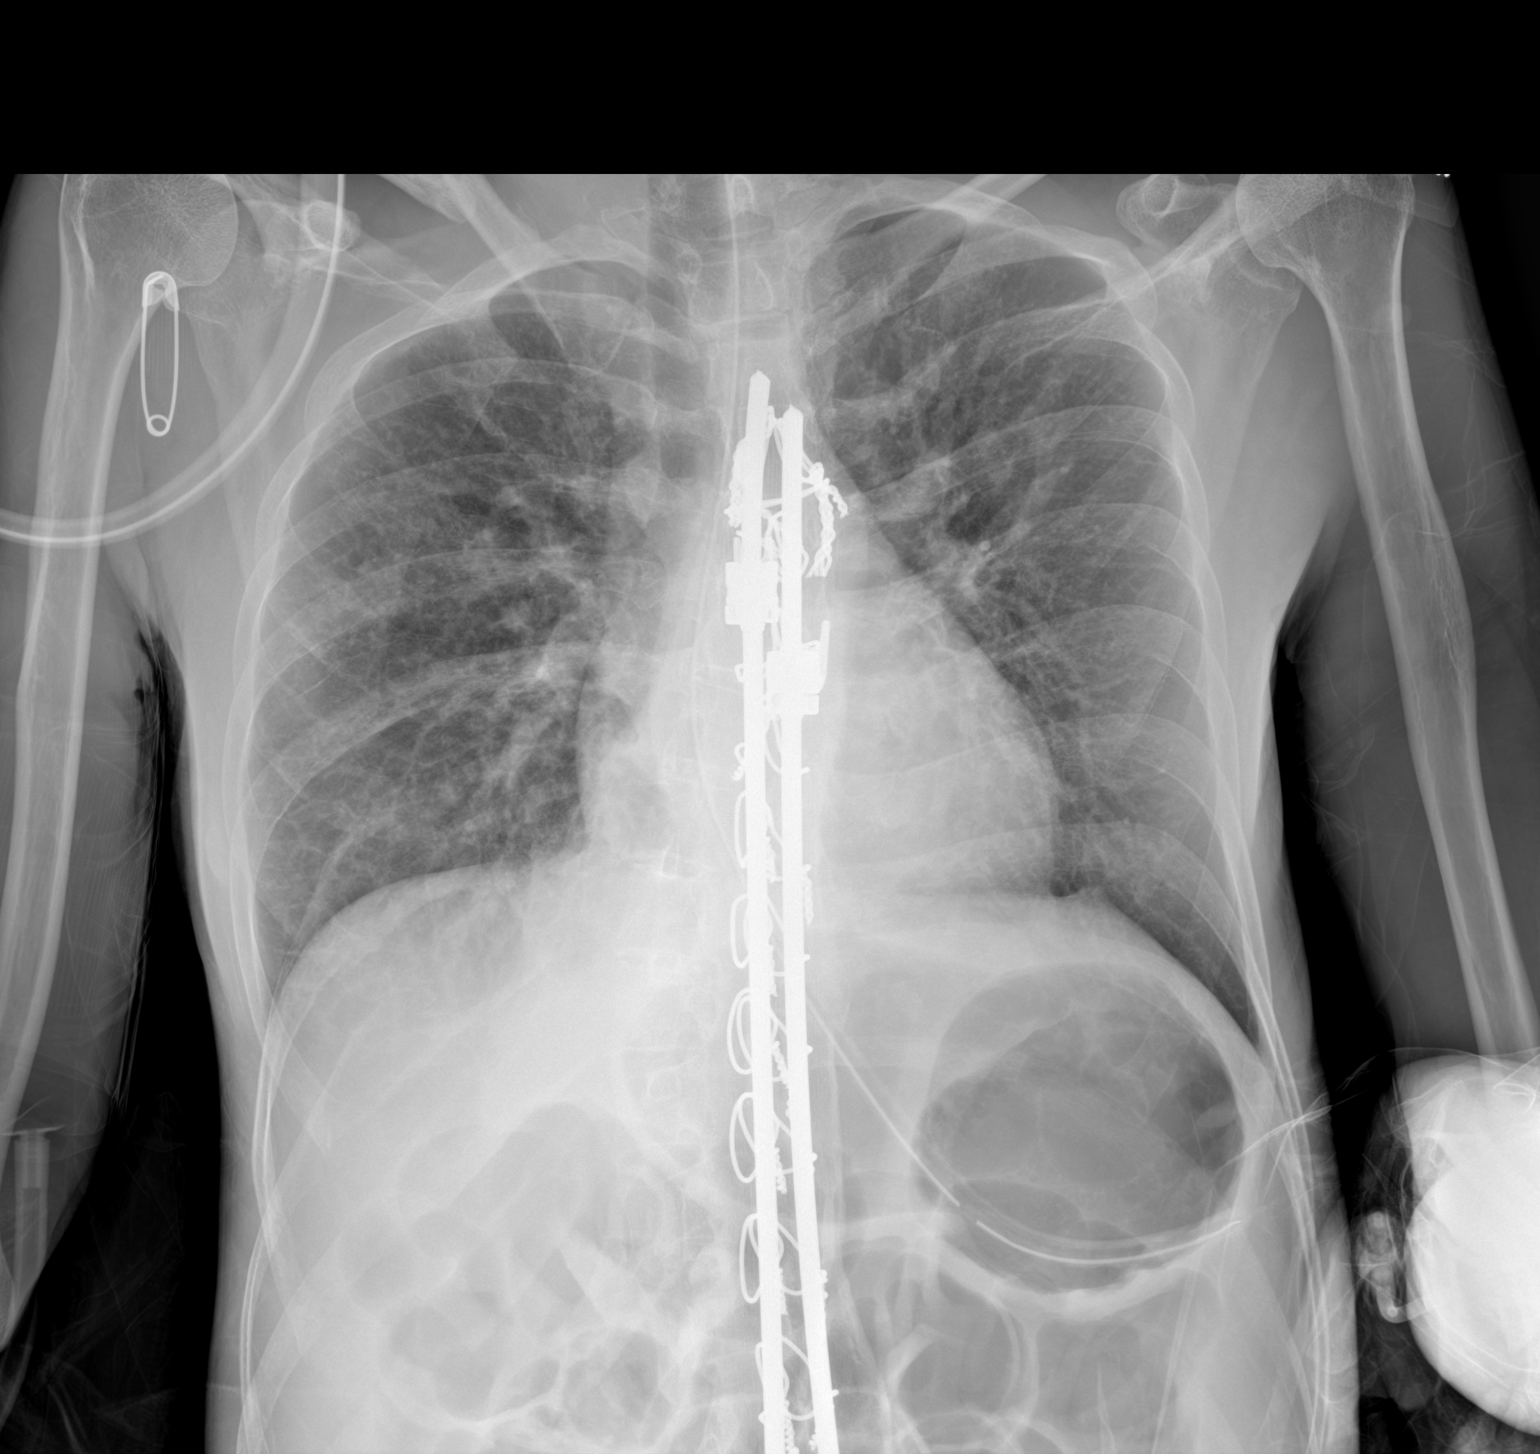

[1 of 1 positions shown; findings below may reference images not displayed]

FINDINGS: Enteric tube tip in the body of the stomach.

Diffuse interstitial coarsening similar to prior radiograph. No
focal consolidation, pleural effusion or pneumothorax. Stable
cardiac silhouette. No acute osseous pathology. Spinal Harrington
rods.
IMPRESSION: Enteric tube with tip in the body of the stomach.

## 2023-02-06 DEATH — deceased
